# Patient Record
Sex: Male | Born: 1940
Health system: Southern US, Community
[De-identification: ages and names within clinical notes are randomized; demographics above are authoritative.]

## PROBLEM LIST (undated history)

## (undated) DIAGNOSIS — F419 Anxiety disorder, unspecified: Secondary | ICD-10-CM

## (undated) DIAGNOSIS — I700 Atherosclerosis of aorta: Secondary | ICD-10-CM

## (undated) DIAGNOSIS — I6523 Occlusion and stenosis of bilateral carotid arteries: Secondary | ICD-10-CM

## (undated) DIAGNOSIS — R7989 Other specified abnormal findings of blood chemistry: Secondary | ICD-10-CM

## (undated) DIAGNOSIS — I10 Essential (primary) hypertension: Secondary | ICD-10-CM

## (undated) DIAGNOSIS — I639 Cerebral infarction, unspecified: Secondary | ICD-10-CM

## (undated) DIAGNOSIS — N183 Chronic kidney disease, stage 3 unspecified: Secondary | ICD-10-CM

## (undated) DIAGNOSIS — C801 Malignant (primary) neoplasm, unspecified: Secondary | ICD-10-CM

## (undated) DIAGNOSIS — M1990 Unspecified osteoarthritis, unspecified site: Secondary | ICD-10-CM

## (undated) DIAGNOSIS — R945 Abnormal results of liver function studies: Secondary | ICD-10-CM

## (undated) DIAGNOSIS — E785 Hyperlipidemia, unspecified: Secondary | ICD-10-CM

## (undated) DIAGNOSIS — Z87442 Personal history of urinary calculi: Secondary | ICD-10-CM

## (undated) DIAGNOSIS — R001 Bradycardia, unspecified: Secondary | ICD-10-CM

## (undated) DIAGNOSIS — C4491 Basal cell carcinoma of skin, unspecified: Secondary | ICD-10-CM

## (undated) DIAGNOSIS — Z7902 Long term (current) use of antithrombotics/antiplatelets: Secondary | ICD-10-CM

## (undated) HISTORY — DX: Hyperlipidemia, unspecified: E78.5

## (undated) HISTORY — DX: Bradycardia, unspecified: R00.1

## (undated) HISTORY — DX: Cerebral infarction, unspecified: I63.9

## (undated) HISTORY — DX: Basal cell carcinoma of skin, unspecified: C44.91

## (undated) HISTORY — DX: Abnormal results of liver function studies: R94.5

## (undated) HISTORY — PX: CATARACT EXTRACTION: SUR2

## (undated) HISTORY — DX: Other specified abnormal findings of blood chemistry: R79.89

## (undated) HISTORY — DX: Anxiety disorder, unspecified: F41.9

## (undated) HISTORY — PX: COLONOSCOPY: SHX174

## (undated) HISTORY — DX: Malignant (primary) neoplasm, unspecified: C80.1

---

## 2008-03-16 DIAGNOSIS — C61 Malignant neoplasm of prostate: Secondary | ICD-10-CM

## 2008-03-16 DIAGNOSIS — C801 Malignant (primary) neoplasm, unspecified: Secondary | ICD-10-CM

## 2008-03-16 HISTORY — DX: Malignant (primary) neoplasm, unspecified: C80.1

## 2008-03-16 HISTORY — DX: Malignant neoplasm of prostate: C61

## 2008-05-18 ENCOUNTER — Emergency Department: Payer: Self-pay | Admitting: Emergency Medicine

## 2008-11-14 HISTORY — PX: PROSTATECTOMY: SHX69

## 2009-08-07 ENCOUNTER — Ambulatory Visit: Payer: Self-pay | Admitting: Gastroenterology

## 2010-09-14 DIAGNOSIS — C4491 Basal cell carcinoma of skin, unspecified: Secondary | ICD-10-CM

## 2010-09-14 HISTORY — DX: Basal cell carcinoma of skin, unspecified: C44.91

## 2010-10-28 LAB — HM COLONOSCOPY: HM Colonoscopy: NORMAL

## 2010-12-20 ENCOUNTER — Other Ambulatory Visit: Payer: Self-pay | Admitting: Internal Medicine

## 2011-01-08 ENCOUNTER — Other Ambulatory Visit: Payer: Self-pay | Admitting: Internal Medicine

## 2011-01-09 ENCOUNTER — Other Ambulatory Visit: Payer: Self-pay | Admitting: Internal Medicine

## 2011-01-09 NOTE — Telephone Encounter (Signed)
Ok to phone in.

## 2011-01-13 ENCOUNTER — Other Ambulatory Visit: Payer: Self-pay | Admitting: Internal Medicine

## 2011-02-13 ENCOUNTER — Encounter: Payer: Self-pay | Admitting: Internal Medicine

## 2011-02-13 ENCOUNTER — Ambulatory Visit (INDEPENDENT_AMBULATORY_CARE_PROVIDER_SITE_OTHER): Payer: Medicare Other | Admitting: Internal Medicine

## 2011-02-13 ENCOUNTER — Ambulatory Visit (INDEPENDENT_AMBULATORY_CARE_PROVIDER_SITE_OTHER)
Admission: RE | Admit: 2011-02-13 | Discharge: 2011-02-13 | Disposition: A | Discharge status: Home / Self Care | Payer: Medicare Other | Source: Ambulatory Visit | Attending: Internal Medicine | Admitting: Internal Medicine

## 2011-02-13 DIAGNOSIS — M1710 Unilateral primary osteoarthritis, unspecified knee: Secondary | ICD-10-CM

## 2011-02-13 DIAGNOSIS — M899 Disorder of bone, unspecified: Secondary | ICD-10-CM

## 2011-02-13 DIAGNOSIS — G893 Neoplasm related pain (acute) (chronic): Secondary | ICD-10-CM

## 2011-02-13 DIAGNOSIS — M79609 Pain in unspecified limb: Secondary | ICD-10-CM

## 2011-02-13 DIAGNOSIS — Z8546 Personal history of malignant neoplasm of prostate: Secondary | ICD-10-CM | POA: Insufficient documentation

## 2011-02-13 DIAGNOSIS — R5383 Other fatigue: Secondary | ICD-10-CM

## 2011-02-13 DIAGNOSIS — E785 Hyperlipidemia, unspecified: Secondary | ICD-10-CM

## 2011-02-13 DIAGNOSIS — Z79899 Other long term (current) drug therapy: Secondary | ICD-10-CM

## 2011-02-13 DIAGNOSIS — C4491 Basal cell carcinoma of skin, unspecified: Secondary | ICD-10-CM | POA: Insufficient documentation

## 2011-02-13 DIAGNOSIS — M949 Disorder of cartilage, unspecified: Secondary | ICD-10-CM

## 2011-02-13 DIAGNOSIS — M79606 Pain in leg, unspecified: Secondary | ICD-10-CM

## 2011-02-13 DIAGNOSIS — Z23 Encounter for immunization: Secondary | ICD-10-CM

## 2011-02-13 DIAGNOSIS — Z13820 Encounter for screening for osteoporosis: Secondary | ICD-10-CM

## 2011-02-13 DIAGNOSIS — M898X9 Other specified disorders of bone, unspecified site: Secondary | ICD-10-CM

## 2011-02-13 DIAGNOSIS — C801 Malignant (primary) neoplasm, unspecified: Secondary | ICD-10-CM

## 2011-02-13 LAB — CBC WITH DIFFERENTIAL/PLATELET
Basophils Relative: 0.4 % (ref 0.0–3.0)
Eosinophils Absolute: 0.1 10*3/uL (ref 0.0–0.7)
Hemoglobin: 15.8 g/dL (ref 13.0–17.0)
Lymphocytes Relative: 9.3 % — ABNORMAL LOW (ref 12.0–46.0)
MCHC: 34.3 g/dL (ref 30.0–36.0)
Monocytes Relative: 12.8 % — ABNORMAL HIGH (ref 3.0–12.0)
Neutro Abs: 4.1 10*3/uL (ref 1.4–7.7)
Neutrophils Relative %: 75.1 % (ref 43.0–77.0)
RBC: 4.96 Mil/uL (ref 4.22–5.81)
WBC: 5.4 10*3/uL (ref 4.5–10.5)

## 2011-02-13 LAB — COMPREHENSIVE METABOLIC PANEL
BUN: 24 mg/dL — ABNORMAL HIGH (ref 6–23)
CO2: 28 mEq/L (ref 19–32)
Calcium: 9.4 mg/dL (ref 8.4–10.5)
Chloride: 105 mEq/L (ref 96–112)
Creatinine, Ser: 1.4 mg/dL (ref 0.4–1.5)
GFR: 54.35 mL/min — ABNORMAL LOW (ref >60.00)
Glucose, Bld: 78 mg/dL (ref 70–99)
Total Bilirubin: 1.4 mg/dL — ABNORMAL HIGH (ref 0.3–1.2)

## 2011-02-13 LAB — TSH: TSH: 1.56 u[IU]/mL (ref 0.35–5.50)

## 2011-02-13 NOTE — Progress Notes (Signed)
Subjective:    Patient ID: Bobby Murillo, male    DOB: February 01, 1941, 70 y.o.   [name]: [id number]  HPI  Mr. Dorris is a 70 yr old white male with a history of hyperlipidemia, hypertension, OA and prostate cancer diagnosied in 2010, s/p radical prostatectomy who returns for followup on chronic medical issues.  His  PSA has been trending upward and when it reached 0.6 he was referred by Dr. Baxter Flattery for XRT and is now seeing Dr. Rodena Medin .  He has had 5 of 6 XRT weekly sessions with only fatigue to report,  No proctitis.  His CC is recurrent nocturnal left leg pain , mostly distal but occasionally the entire leg.  Does not occur with activity, specifically not  when he plays tennis.  No history of DM or tobacco abuse. No back pain. He stopped his pravastatin for a month but the pain did not change.   His prior DHD pain complaints have improved after receiving steroid injections ot both knees several months ago.    Review of Systems  Constitutional: Positive for fatigue. Negative for fever, chills, diaphoresis, activity change, appetite change and unexpected weight change.  HENT: Negative for hearing loss, ear pain, nosebleeds, congestion, sore throat, facial swelling, rhinorrhea, sneezing, drooling, mouth sores, trouble swallowing, neck pain, neck stiffness, dental problem, voice change, postnasal drip, sinus pressure, tinnitus and ear discharge.   Eyes: Negative for photophobia, pain, discharge, redness, itching and visual disturbance.  Respiratory: Negative for apnea, cough, choking, chest tightness, shortness of breath, wheezing and stridor.   Cardiovascular: Negative for chest pain, palpitations and leg swelling.  Gastrointestinal: Negative for nausea, vomiting, abdominal pain, diarrhea, constipation, blood in stool, abdominal distention, anal bleeding and rectal pain.  Genitourinary: Negative for dysuria, urgency, frequency, hematuria, flank pain, decreased urine volume, scrotal swelling, difficulty urinating  and testicular pain.  Musculoskeletal: Positive for arthralgias. Negative for myalgias, back pain, joint swelling and gait problem.  Skin: Negative for color change, rash and wound.  Neurological: Negative for dizziness, tremors, seizures, syncope, speech difficulty, weakness, light-headedness, numbness and headaches.  Psychiatric/Behavioral: Negative for suicidal ideas, hallucinations, behavioral problems, confusion, sleep disturbance, dysphoric mood, decreased concentration and agitation. The patient is not nervous/anxious.        Objective:   Physical Exam  Constitutional: He is oriented to person, place, and time.  HENT:  Head: Normocephalic and atraumatic.  Mouth/Throat: Oropharynx is clear and moist.  Eyes: Conjunctivae and EOM are normal.  Neck: Normal range of motion. Neck supple. No JVD present. No thyromegaly present.  Cardiovascular: Normal rate, regular rhythm, normal heart sounds and intact distal pulses.   Pulmonary/Chest: Effort normal and breath sounds normal. He has no wheezes. He has no rales.  Abdominal: Soft. Bowel sounds are normal. He exhibits no mass. There is no tenderness. There is no rebound.  Musculoskeletal: Normal range of motion. He exhibits no edema.  Neurological: He is alert and oriented to person, place, and time.  Skin: Skin is warm and dry.       excessively dry, normal hair disrtibution on leg  Psychiatric: He has a normal mood and affect.       Assessment & Plan:   prostate cancer Managed by Dr. Baxter Flattery with radical prostatectomy in Sept 2010, Gleaeon score 4 + 3 = 7 , positive margins.  Recently started XRT for rise in PSA from undetectible to detectible levels, now 0.6   Leg pain, diffuse Left leg, for the last several months, with no joint  involvement.  Etiology unclear,  But given his history of prostate Cancer will obtain plain films to rule out sclerotic bone lesions /metastasis   Degenerative joint disease of knee improved with bilateral  steroid injections done by me last July, left knee was done in April 2012 by Dr. Katrinka Blazing.  Continue prn tramadol and  Ibuprofen. Prior daily use of meloxicam raised his Cr.  Repeat labs show a slight rise in Cr from 1.23 in April to 1.4 currently.  Will repeat in 6 months   Hyperlipidemia LDL goal < 100 Repeat LDL is 122 on currewnt statin dose,  Will increase statin with next refill given CRFS of hypertension.     Updated Medication List Outpatient Encounter Prescriptions as of 02/13/2011  Medication Sig Dispense Refill  . acetaminophen (TYLENOL) 325 MG tablet Take 650 mg by mouth every 6 (six) hours as needed.        . ALPRAZolam (XANAX) 0.5 MG tablet TAKE ONE TABLET BY MOUTH AT BEDTIME AS NEEDED FOR SLEEP  90 tablet  2  . cholecalciferol (VITAMIN D) 1000 UNITS tablet Take 1,000 Units by mouth daily.        Marland Kitchen ibuprofen (ADVIL,MOTRIN) 200 MG tablet Take 200 mg by mouth every 6 (six) hours as needed.        Marland Kitchen lisinopril (PRINIVIL,ZESTRIL) 10 MG tablet Take 10 mg by mouth daily.        . Multiple Vitamin (MULTIVITAMIN) tablet Take 1 tablet by mouth daily.        . naproxen (NAPROSYN) 250 MG tablet Take 250 mg by mouth as needed.        . polycarbophil (FIBERCON) 625 MG tablet Take 625 mg by mouth daily.        . pravastatin (PRAVACHOL) 40 MG tablet Take 40 mg by mouth daily.        Marland Kitchen DISCONTD: traMADol (ULTRAM) 50 MG tablet Take 50 mg by mouth every 6 (six) hours as needed. Maximum dose= 8 tablets per day

## 2011-02-13 NOTE — Patient Instructions (Addendum)
We are gong to investigate your leg pain with plain films of the femur and tib/fib at [facility] today.  Your pulses are excellent so I think that a circulation problem is highly unlikely  I am ruling out underactive thyroid and anemia as contributors to your fatigue.   I am going to schedule your for a bone density test to make sure you do not have osteoprosis

## 2011-02-15 ENCOUNTER — Encounter: Payer: Self-pay | Admitting: Internal Medicine

## 2011-02-15 DIAGNOSIS — M79606 Pain in leg, unspecified: Secondary | ICD-10-CM | POA: Insufficient documentation

## 2011-02-15 DIAGNOSIS — M1710 Unilateral primary osteoarthritis, unspecified knee: Secondary | ICD-10-CM | POA: Insufficient documentation

## 2011-02-15 DIAGNOSIS — E785 Hyperlipidemia, unspecified: Secondary | ICD-10-CM | POA: Insufficient documentation

## 2011-02-15 NOTE — Assessment & Plan Note (Signed)
improved with bilateral steroid injections done by me last July, left knee was done in April 2012 by Dr. Katrinka Blazing.  Continue prn tramadol and  Ibuprofen. Prior daily use of meloxicam raised his Cr.  Repeat labs show a slight rise in Cr from 1.23 in April to 1.4 currently.  Will repeat in 6 months

## 2011-02-15 NOTE — Assessment & Plan Note (Signed)
Managed by Dr. Baxter Flattery with radical prostatectomy in Sept 2010, Gleaeon score 4 + 3 = 7 , positive margins.  Recently started XRT for rise in PSA from undetectible to detectible levels, now 0.6

## 2011-02-15 NOTE — Assessment & Plan Note (Addendum)
Left leg, for the last several months, with no joint involvement.  Etiology unclear,  But given his history of prostate Cancer I ordered plain films to rule out sclerotic bone lesions /metastasis .  There were no sclerotic or lytic lesions any bones, and DJD was noted.   The fibular cortex was irregular, which could indicate bony turnover and a nuclear bone scan was offered .  Will refer to [facility] for this test.

## 2011-02-15 NOTE — Assessment & Plan Note (Signed)
Repeat LDL is 122 on currewnt statin dose,  Will increase statin with next refill given CRFS of hypertension.

## 2011-02-18 NOTE — Progress Notes (Signed)
I don't see that physician in the [facility] , I did find a Dr. Carin Primrose [phone].

## 2011-02-25 ENCOUNTER — Ambulatory Visit: Payer: Self-pay | Admitting: Internal Medicine

## 2011-02-27 ENCOUNTER — Telehealth: Payer: Self-pay | Admitting: *Deleted

## 2011-02-27 NOTE — Telephone Encounter (Signed)
This is your pt

## 2011-02-27 NOTE — Telephone Encounter (Signed)
Pt calling in regards to Bone Density scan being set up. Please Advise

## 2011-02-27 NOTE — Telephone Encounter (Signed)
Carollee Herter , this patient i inquiring about the bone density test I ordered ovwer a week ago,  Have you  scheduled it yet?  Can you call him back?

## 2011-03-02 NOTE — Telephone Encounter (Signed)
Patient has a bone density test scheduled for Wednesday 03/04/11 at 10:30 he is not to take any calcium supplements 24 hours prior to test.  He is aware of this appointment.

## 2011-03-02 NOTE — Telephone Encounter (Signed)
Please patient know that I spoke with Dr Nedra Hai about his bone pain and sent him a copy of the  X ray reports that he had done . He  is seeing Dr Nedra Hai tomorrow and Dr Nedra Hai will order a Bone Scan (different than a DEXA scan, which is the bone density test that looks for osteoporosis) if he thinks it is necessary

## 2011-03-02 NOTE — Telephone Encounter (Signed)
Patient notified

## 2011-03-03 ENCOUNTER — Encounter: Payer: Self-pay | Admitting: Internal Medicine

## 2011-03-04 ENCOUNTER — Ambulatory Visit: Payer: Self-pay | Admitting: Internal Medicine

## 2011-03-05 ENCOUNTER — Telehealth: Payer: Self-pay | Admitting: Internal Medicine

## 2011-03-05 NOTE — Telephone Encounter (Signed)
Pt aware of this. 

## 2011-03-05 NOTE — Telephone Encounter (Signed)
Bobby Murillo has no signs of osteoporosis by his recent Bone Density Study ( FYI THIS IS NOT the same study as a Bone scan; it does not identify cancer,  just thin bones at [facility] for fractures )

## 2011-04-01 ENCOUNTER — Encounter: Payer: Self-pay | Admitting: Internal Medicine

## 2011-05-20 ENCOUNTER — Encounter: Payer: Self-pay | Admitting: Internal Medicine

## 2011-10-20 ENCOUNTER — Telehealth: Payer: Self-pay | Admitting: Internal Medicine

## 2011-10-20 DIAGNOSIS — R5383 Other fatigue: Secondary | ICD-10-CM

## 2011-10-20 DIAGNOSIS — Z8546 Personal history of malignant neoplasm of prostate: Secondary | ICD-10-CM

## 2011-10-20 DIAGNOSIS — E785 Hyperlipidemia, unspecified: Secondary | ICD-10-CM

## 2011-10-20 NOTE — Telephone Encounter (Signed)
Fasting labs ordered.  please confirm that he wants me check a PSA

## 2011-10-20 NOTE — Telephone Encounter (Signed)
Pt called to see if you wanted lab work prior to his appointment  On 8/14 for medicare wellness [facility] Please advise pt

## 2011-10-21 NOTE — Telephone Encounter (Signed)
Order faxed to [facility].  Patient is okay with having the psa

## 2011-10-22 ENCOUNTER — Telehealth: Payer: Self-pay | Admitting: Internal Medicine

## 2011-10-22 NOTE — Telephone Encounter (Signed)
Patient is needing CPX labs put in the system for 8.9.13.

## 2011-10-23 ENCOUNTER — Other Ambulatory Visit (INDEPENDENT_AMBULATORY_CARE_PROVIDER_SITE_OTHER): Payer: Medicare Other | Admitting: *Deleted

## 2011-10-23 DIAGNOSIS — Z8546 Personal history of malignant neoplasm of prostate: Secondary | ICD-10-CM

## 2011-10-23 DIAGNOSIS — E785 Hyperlipidemia, unspecified: Secondary | ICD-10-CM

## 2011-10-23 DIAGNOSIS — R5383 Other fatigue: Secondary | ICD-10-CM

## 2011-10-23 NOTE — Telephone Encounter (Signed)
They were put in several days ago.

## 2011-10-23 NOTE — Addendum Note (Signed)
Addended by: Melody Comas L on: 10/23/2011 11:42 AM   Modules accepted: Orders

## 2011-10-24 LAB — COMPREHENSIVE METABOLIC PANEL
ALT: 23 IU/L (ref 0–44)
Albumin: 4.3 g/dL (ref 3.6–4.8)
BUN: 20 mg/dL (ref 8–27)
CO2: 22 mmol/L (ref 19–28)
Calcium: 9.5 mg/dL (ref 8.6–10.2)
Chloride: 105 mmol/L (ref 97–108)
GFR calc Af Amer: 69 mL/min/{1.73_m2} (ref >59)
Glucose: 91 mg/dL (ref 65–99)
Potassium: 4.6 mmol/L (ref 3.5–5.2)
Total Bilirubin: 1.1 mg/dL (ref 0.0–1.2)
Total Protein: 6.6 g/dL (ref 6.0–8.5)

## 2011-10-24 LAB — LIPID PANEL
Cholesterol, Total: 280 mg/dL — ABNORMAL HIGH (ref 100–199)
HDL: 63 mg/dL (ref >39)
LDL Calculated: 192 mg/dL — ABNORMAL HIGH (ref 0–99)
Triglycerides: 126 mg/dL (ref 0–149)
VLDL Cholesterol Cal: 25 mg/dL (ref 5–40)

## 2011-10-24 LAB — CBC WITH DIFFERENTIAL/PLATELET
Basophils Absolute: 0 10*3/uL (ref 0.0–0.2)
Basos: 1 % (ref 0–3)
Hemoglobin: 16.3 g/dL (ref 12.6–17.7)
Immature Grans (Abs): 0 10*3/uL (ref 0.0–0.1)
Lymphs: 15 % (ref 14–46)
MCHC: 35.6 g/dL (ref 31.5–35.7)
Monocytes: 14 % — ABNORMAL HIGH (ref 4–13)
Neutrophils Absolute: 3.3 10*3/uL (ref 1.8–7.8)
Neutrophils Relative %: 66 % (ref 40–74)
RBC: 5.21 x10E6/uL (ref 4.14–5.80)

## 2011-10-28 ENCOUNTER — Ambulatory Visit (INDEPENDENT_AMBULATORY_CARE_PROVIDER_SITE_OTHER): Payer: Medicare Other | Admitting: Internal Medicine

## 2011-10-28 ENCOUNTER — Encounter: Payer: Self-pay | Admitting: Internal Medicine

## 2011-10-28 VITALS — BP 120/86 | HR 58 | Temp 97.6°F | Ht 73.0 in | Wt 198.5 lb

## 2011-10-28 DIAGNOSIS — C801 Malignant (primary) neoplasm, unspecified: Secondary | ICD-10-CM

## 2011-10-28 DIAGNOSIS — M79609 Pain in unspecified limb: Secondary | ICD-10-CM

## 2011-10-28 DIAGNOSIS — E785 Hyperlipidemia, unspecified: Secondary | ICD-10-CM

## 2011-10-28 DIAGNOSIS — M79606 Pain in leg, unspecified: Secondary | ICD-10-CM

## 2011-10-28 NOTE — Patient Instructions (Addendum)
Stay off of pravastatin  if your muscles are feeling  better  We will send the lab results to [facility]

## 2011-10-28 NOTE — Progress Notes (Signed)
Patient ID: Bobby Murillo, male   DOB: July 03, 1940, 71 y.o.   [name]: [id number]  The patient is here for annual Medicare wellness examination and management of other chronic and acute problems.   The risk factors are reflected in the social history.  The roster of all physicians providing medical care to patient - is listed in the Snapshot section of the chart.  Activities of daily living:  The patient is 100% independent in all ADLs: dressing, toileting, feeding as well as independent mobility  Home safety : The patient has smoke detectors in the home. They wear seatbelts.  There are no firearms at home. There is no violence in the home.   There is no risks for hepatitis, STDs or HIV. There is no   history of blood transfusion. They have no travel history to infectious disease endemic areas of the world.  The patient has seen their dentist in the last six month. They have seen their eye doctor in the last year. They admit to slight hearing difficulty with regard to whispered voices and some television programs.  They have deferred audiologic testing in the last year.  They do not  have excessive sun exposure. Discussed the need for sun protection: hats, long sleeves and use of sunscreen if there is significant sun exposure.   Diet: the importance of a healthy diet is discussed. They do have a healthy diet.  The benefits of regular aerobic exercise were discussed. She walks 4 times per week ,  20 minutes.   Depression screen: there are no signs or vegative symptoms of depression- irritability, change in appetite, anhedonia, sadness/tearfullness.  Cognitive assessment: the patient manages all their financial and personal affairs and is actively engaged. They could relate day,date,year and events; recalled 2/3 objects at 3 minutes; performed clock-face test normally.  The following portions of the patient's history were reviewed and updated as appropriate: allergies, current medications, past family  history, past medical history,  past surgical history, past social history  and problem list.  Visual acuity was not assessed per patient preference since she has regular follow up with her ophthalmologist. Hearing and body mass index were assessed and reviewed.   During the course of the visit the patient was educated and counseled about appropriate screening and preventive services including : fall prevention , diabetes screening, nutrition counseling, colorectal cancer screening, and recommended immunizations.    Objective:  BP 120/86  Pulse 58  Temp 97.6 F (36.4 C) (Oral)  Ht 6\' 1"  (1.854 m)  Wt 198 lb 8 oz (90.039 kg)  BMI 26.19 kg/m2  SpO2 99% General appearance: alert, cooperative and appears stated age Ears: normal TM's and external ear canals both ears Throat: lips, mucosa, and tongue normal; teeth and gums normal Neck: no adenopathy, no carotid bruit, no JVD, supple, symmetrical, trachea midline and thyroid not enlarged, symmetric, no tenderness/mass/nodules Back: symmetric, no curvature. ROM normal. No CVA tenderness. Lungs: clear to auscultation bilaterally Chest wall: no tenderness Heart: regular rate and rhythm, S1, S2 normal, no murmur, click, rub or gallop Abdomen: soft, non-tender; bowel sounds normal; no masses,  no organomegaly Extremities: extremities normal, atraumatic, no cyanosis or edema Pulses: 2+ and symmetric Skin: Skin color, texture, turgor normal. No rashes or lesions  Assessment and Plan Hyperlipidemia LDL goal < 100 His leg pain has improved since he stopped his statin. He has no history of coronary artery disease or stroke. He is physically active and maintains a good diet. We discussed the risks  and benefits of resuming statin therapy and is essentially suspend indefinitely.  Leg pain, diffuse Ideology appears to have been statin use. X-rays were normal , scans were normal.  Pain is improved since stopping his statin.  prostate cancer PSA is now  undetectable again since undergoing repeat therapy by Dr. Baxter Flattery.    Updated Medication List Outpatient Encounter Prescriptions as of 10/28/2011  Medication Sig Dispense Refill  . acetaminophen (TYLENOL) 325 MG tablet Take 650 mg by mouth every 6 (six) hours as needed.        . ALPRAZolam (XANAX) 0.5 MG tablet TAKE ONE TABLET BY MOUTH AT BEDTIME AS NEEDED FOR SLEEP  90 tablet  2  . cholecalciferol (VITAMIN D) 1000 UNITS tablet Take 1,000 Units by mouth daily.        Marland Kitchen ibuprofen (ADVIL,MOTRIN) 200 MG tablet Take 200 mg by mouth every 6 (six) hours as needed.        Marland Kitchen lisinopril (PRINIVIL,ZESTRIL) 10 MG tablet Take 10 mg by mouth daily.        . Multiple Vitamin (MULTIVITAMIN) tablet Take 1 tablet by mouth daily.        . naproxen (NAPROSYN) 250 MG tablet Take 250 mg by mouth as needed.        . polycarbophil (FIBERCON) 625 MG tablet Take 625 mg by mouth daily.        Marland Kitchen DISCONTD: pravastatin (PRAVACHOL) 40 MG tablet Take 40 mg by mouth daily.

## 2011-10-29 NOTE — Assessment & Plan Note (Signed)
Ideology appears to have been statin use. X-rays were normal , scans were normal.  Pain is improved since stopping his statin.

## 2011-10-29 NOTE — Assessment & Plan Note (Signed)
His leg pain has improved since he stopped his statin. He has no history of coronary artery disease or stroke. He is physically active and maintains a good diet. We discussed the risks and benefits of resuming statin therapy and is essentially suspend indefinitely.

## 2011-10-29 NOTE — Assessment & Plan Note (Signed)
PSA is now undetectable again since undergoing repeat therapy by Dr. Baxter Flattery.

## 2011-11-19 ENCOUNTER — Other Ambulatory Visit: Payer: Self-pay | Admitting: Internal Medicine

## 2011-11-20 NOTE — Telephone Encounter (Signed)
Rx called to [facility]

## 2011-12-11 ENCOUNTER — Other Ambulatory Visit: Payer: Self-pay | Admitting: Internal Medicine

## 2012-01-12 DIAGNOSIS — C61 Malignant neoplasm of prostate: Secondary | ICD-10-CM | POA: Insufficient documentation

## 2012-04-22 ENCOUNTER — Telehealth: Payer: Self-pay | Admitting: *Deleted

## 2012-04-22 DIAGNOSIS — C61 Malignant neoplasm of prostate: Secondary | ICD-10-CM

## 2012-04-22 DIAGNOSIS — C801 Malignant (primary) neoplasm, unspecified: Secondary | ICD-10-CM

## 2012-04-22 DIAGNOSIS — Z79899 Other long term (current) drug therapy: Secondary | ICD-10-CM

## 2012-04-22 DIAGNOSIS — R5383 Other fatigue: Secondary | ICD-10-CM

## 2012-04-22 DIAGNOSIS — E785 Hyperlipidemia, unspecified: Secondary | ICD-10-CM

## 2012-04-22 NOTE — Telephone Encounter (Signed)
Patient called wanting lab order for [facility] before his 05/04/12 appointment with Dr. Darrick Huntsman. He uses [facility] on [address]. Patient would like a call back when order is ready. Cell [phone]

## 2012-04-22 NOTE — Telephone Encounter (Signed)
Order can be picked up on Monday and will be in your red folder

## 2012-04-25 NOTE — Telephone Encounter (Signed)
Pt notified and orders placed up front for pick up.

## 2012-05-04 ENCOUNTER — Ambulatory Visit (INDEPENDENT_AMBULATORY_CARE_PROVIDER_SITE_OTHER): Payer: Medicare Other | Admitting: Internal Medicine

## 2012-05-04 ENCOUNTER — Telehealth: Payer: Self-pay | Admitting: Internal Medicine

## 2012-05-04 ENCOUNTER — Encounter: Payer: Self-pay | Admitting: Internal Medicine

## 2012-05-04 VITALS — BP 114/64 | HR 56 | Temp 98.2°F | Resp 16 | Ht 72.75 in | Wt 204.5 lb

## 2012-05-04 DIAGNOSIS — M1710 Unilateral primary osteoarthritis, unspecified knee: Secondary | ICD-10-CM

## 2012-05-04 DIAGNOSIS — C801 Malignant (primary) neoplasm, unspecified: Secondary | ICD-10-CM

## 2012-05-04 DIAGNOSIS — IMO0002 Reserved for concepts with insufficient information to code with codable children: Secondary | ICD-10-CM

## 2012-05-04 DIAGNOSIS — M79606 Pain in leg, unspecified: Secondary | ICD-10-CM

## 2012-05-04 DIAGNOSIS — M79609 Pain in unspecified limb: Secondary | ICD-10-CM

## 2012-05-04 DIAGNOSIS — E785 Hyperlipidemia, unspecified: Secondary | ICD-10-CM

## 2012-05-04 MED ORDER — OMEPRAZOLE 20 MG PO CPDR: 20.0000 mg | DELAYED_RELEASE_CAPSULE | Freq: Every day | ORAL | Status: DC

## 2012-05-04 NOTE — Progress Notes (Signed)
Patient ID: Bobby Murillo, male   DOB: [birth year], 72 y.o.   [name]: [id number]  Patient Active Problem List  Diagnosis  . prostate cancer  . Basal cell carcinoma  . Leg pain, diffuse  . Degenerative joint disease of knee  . Other and unspecified hyperlipidemia    Subjective:  CC:   Chief Complaint  Patient presents with  . Follow-up    6 month    HPI:   Bobby Murillo a 72 y.o. male who presents for 6 month follow up on chronic medical conditions including hyperlipidemia, intolerant of statins due to diffuse leg pain, history of prostate CA,  and OA .  He feels generally well and remains physically very active in retirement but continues to have chronic bilateral knee pain which limits his flexion and is therefore affecting his running speed.  He has been seen by orthopedist Reita Chard and there are no plans for surgery, but intraarticular steroid injections have been offered.  Dr Stoney Bang has released him but has advised semi annual PSAs for surveillance. He has no issues with bladder dysfunction or impotence.    Past Medical History  Diagnosis Date  . Anxiety   . Hyperlipidemia   . Abnormal LFTs   . prostate cancer 2010    Dr. Baxter Flattery  . Basal cell carcinoma July 2012    left calf     Past Surgical History  Procedure Laterality Date  . Prostatectomy  11/2008    transurethral, radical, Dr. Carin Primrose       The following portions of the patient's history were reviewed and updated as appropriate: Allergies, current medications, and problem list.    Review of Systems:   Patient denies headache, fevers, malaise, unintentional weight loss, skin rash, eye pain, sinus congestion and sinus pain, sore throat, dysphagia,  hemoptysis , cough, dyspnea, wheezing, chest pain, palpitations, orthopnea, edema, abdominal pain, nausea, melena, diarrhea, constipation, flank pain, dysuria, hematuria, urinary  Frequency, nocturia, numbness, tingling, seizures,  Focal weakness, Loss of consciousness,   Tremor, insomnia, depression, anxiety, and suicidal ideation.       History   Social History  . Marital Status: Married    Spouse Name: N/A    Number of Children: N/A  . Years of Education: N/A   Occupational History  . Not on file.   Social History Main Topics  . Smoking status: Never Smoker   . Smokeless tobacco: Never Used  . Alcohol Use: Yes  . Drug Use: No  . Sexually Active: Not on file   Other Topics Concern  . Not on file   Social History Narrative  . No narrative on file    Objective:  BP 114/64  Pulse 56  Temp(Src) 98.2 F (36.8 C) (Oral)  Resp 16  Ht 6' 0.75" (1.848 m)  Wt 204 lb 8 oz (92.761 kg)  BMI 27.16 kg/m2  SpO2 97%  General appearance: alert, cooperative and appears stated age Neck: no adenopathy, no carotid bruit, supple, symmetrical, trachea midline and thyroid not enlarged, symmetric, no tenderness/mass/nodules Back: symmetric, no curvature. ROM normal. No CVA tenderness. Lungs: clear to auscultation bilaterally Heart: regular rate and rhythm, S1, S2 normal, no murmur, click, rub or gallop. No LE edema Abdomen: soft, non-tender; bowel sounds normal; no masses,  no organomegaly Pulses: 2+ and symmetric Skin: Skin color, texture, turgor normal. No rashes or lesions Lymph nodes: Cervical, supraclavicular, and axillary nodes normal. MSK: mildy restricted ROM of knees with regard to flexion , otherwise normal.  Assessment and Plan:  Degenerative joint disease of knee Stable currently.  No plans for surgery at this time.   Leg pain, diffuse Secondary to statin intolerance,  Resolved with statin discontinuation  prostate cancer PSA remains undetectable.   Other and unspecified hyperlipidemia Untreated secondary to statin intolerance.  He has no other CRFs other than age and remains physically quite active with no signs or symptoms concerning for CAD or PAD.    Updated Medication List Outpatient Encounter Prescriptions as of  05/04/2012  Medication Sig Dispense Refill  . acetaminophen (TYLENOL) 325 MG tablet Take 650 mg by mouth every 6 (six) hours as needed.        . ALPRAZolam (XANAX) 0.5 MG tablet TAKE ONE TABLET BY MOUTH AT BEDTIME AS NEEDED FOR SLEEP  90 tablet  1  . cholecalciferol (VITAMIN D) 1000 UNITS tablet Take 1,000 Units by mouth daily.        . Glucosamine-Chondroitin (GLUCOSAMINE CHONDR COMPLEX PO) Take 2 tablets by mouth daily. tab by mouth daily      . ibuprofen (ADVIL,MOTRIN) 200 MG tablet Take 200 mg by mouth every 6 (six) hours as needed.        Marland Kitchen lisinopril (PRINIVIL,ZESTRIL) 10 MG tablet TAKE ONE TABLET BY MOUTH EVERY DAY  90 tablet  3  . Multiple Vitamin (MULTIVITAMIN) tablet Take 1 tablet by mouth daily.        . naproxen (NAPROSYN) 250 MG tablet Take 250 mg by mouth as needed.        . polycarbophil (FIBERCON) 625 MG tablet Take 625 mg by mouth daily.        Marland Kitchen omeprazole (PRILOSEC) 20 MG capsule Take 1 capsule (20 mg total) by mouth daily.  30 capsule  3   No facility-administered encounter medications on file as of 05/04/2012.     No orders of the defined types were placed in this encounter.    No Follow-up on file.

## 2012-05-05 ENCOUNTER — Encounter: Payer: Self-pay | Admitting: General Practice

## 2012-05-05 ENCOUNTER — Telehealth: Payer: Self-pay | Admitting: Internal Medicine

## 2012-05-05 DIAGNOSIS — N181 Chronic kidney disease, stage 1: Secondary | ICD-10-CM

## 2012-05-05 NOTE — Telephone Encounter (Signed)
Pt notified of results also wanted labs drawn at [facility]. Letter written and placed in folder up front for pick up. Pt notified.

## 2012-05-05 NOTE — Telephone Encounter (Signed)
PSA is still undetectable , and cholesterol is about the same without medication: LDL 190, trigs 165 HDL 59 total 282.  Kidney function was a little off, may be because he was fasting, but would like to repeat it in a few weeks nonfasting to be sure.

## 2012-05-07 NOTE — Assessment & Plan Note (Signed)
Untreated secondary to statin intolerance.  He has no other CRFs other than age and remains physically quite active with no signs or symptoms concerning for CAD or PAD.

## 2012-05-07 NOTE — Assessment & Plan Note (Signed)
Stable currently.  No plans for surgery at this time.

## 2012-05-07 NOTE — Assessment & Plan Note (Signed)
Secondary to statin intolerance,  Resolved with statin discontinuation

## 2012-05-07 NOTE — Assessment & Plan Note (Signed)
PSA remains undetectable.

## 2012-05-17 ENCOUNTER — Encounter: Payer: Self-pay | Admitting: Internal Medicine

## 2012-05-26 ENCOUNTER — Encounter: Payer: Self-pay | Admitting: Internal Medicine

## 2012-10-31 ENCOUNTER — Other Ambulatory Visit: Payer: Self-pay | Admitting: *Deleted

## 2012-10-31 MED ORDER — ALPRAZOLAM 0.5 MG PO TABS: ORAL_TABLET | ORAL | Status: DC

## 2012-11-01 ENCOUNTER — Encounter: Payer: Self-pay | Admitting: *Deleted

## 2012-11-02 ENCOUNTER — Ambulatory Visit (INDEPENDENT_AMBULATORY_CARE_PROVIDER_SITE_OTHER): Payer: Medicare Other | Admitting: Internal Medicine

## 2012-11-02 ENCOUNTER — Encounter: Payer: Self-pay | Admitting: Internal Medicine

## 2012-11-02 VITALS — BP 128/70 | HR 50 | Temp 97.6°F | Resp 12 | Wt 197.2 lb

## 2012-11-02 DIAGNOSIS — Z6825 Body mass index (BMI) 25.0-25.9, adult: Secondary | ICD-10-CM

## 2012-11-02 DIAGNOSIS — D649 Anemia, unspecified: Secondary | ICD-10-CM

## 2012-11-02 DIAGNOSIS — R5381 Other malaise: Secondary | ICD-10-CM

## 2012-11-02 DIAGNOSIS — Z888 Allergy status to other drugs, medicaments and biological substances status: Secondary | ICD-10-CM

## 2012-11-02 DIAGNOSIS — E785 Hyperlipidemia, unspecified: Secondary | ICD-10-CM

## 2012-11-02 DIAGNOSIS — M1710 Unilateral primary osteoarthritis, unspecified knee: Secondary | ICD-10-CM

## 2012-11-02 DIAGNOSIS — E663 Overweight: Secondary | ICD-10-CM

## 2012-11-02 DIAGNOSIS — Z789 Other specified health status: Secondary | ICD-10-CM

## 2012-11-02 LAB — CBC WITH DIFFERENTIAL/PLATELET
Basophils Absolute: 0 K/uL (ref 0.0–0.1)
Basophils Relative: 0.6 % (ref 0.0–3.0)
Eosinophils Absolute: 0.2 K/uL (ref 0.0–0.7)
Eosinophils Relative: 3.5 % (ref 0.0–5.0)
HCT: 46.2 % (ref 39.0–52.0)
Hemoglobin: 15.9 g/dL (ref 13.0–17.0)
Lymphocytes Relative: 15.3 % (ref 12.0–46.0)
Lymphs Abs: 0.7 K/uL (ref 0.7–4.0)
MCHC: 34.4 g/dL (ref 30.0–36.0)
MCV: 90.6 fl (ref 78.0–100.0)
Monocytes Absolute: 0.7 K/uL (ref 0.1–1.0)
Monocytes Relative: 13.7 % — ABNORMAL HIGH (ref 3.0–12.0)
Neutro Abs: 3.2 K/uL (ref 1.4–7.7)
Neutrophils Relative %: 66.9 % (ref 43.0–77.0)
Platelets: 229 K/uL (ref 150.0–400.0)
RBC: 5.1 Mil/uL (ref 4.22–5.81)
RDW: 13.6 % (ref 11.5–14.6)
WBC: 4.8 K/uL (ref 4.5–10.5)

## 2012-11-02 LAB — COMPREHENSIVE METABOLIC PANEL
ALT: 25 U/L (ref 0–53)
Albumin: 3.9 g/dL (ref 3.5–5.2)
Alkaline Phosphatase: 79 U/L (ref 39–117)
CO2: 28 mEq/L (ref 19–32)
GFR: 57.94 mL/min — ABNORMAL LOW (ref >60.00)
Glucose, Bld: 68 mg/dL — ABNORMAL LOW (ref 70–99)
Potassium: 4.5 mEq/L (ref 3.5–5.1)
Sodium: 136 mEq/L (ref 135–145)
Total Bilirubin: 1.5 mg/dL — ABNORMAL HIGH (ref 0.3–1.2)
Total Protein: 6.6 g/dL (ref 6.0–8.3)

## 2012-11-02 LAB — VITAMIN B12: Vitamin B-12: 452 pg/mL (ref 211–911)

## 2012-11-02 MED ORDER — TETANUS-DIPHTH-ACELL PERTUSSIS 5-2.5-18.5 LF-MCG/0.5 IM SUSP: 0.5000 mL | Freq: Once | INTRAMUSCULAR | Status: DC

## 2012-11-02 NOTE — Patient Instructions (Addendum)
We will screen you today for fatigue issues  Try taking 1 or 2 acetominophen at bedtime to see if the shoulder pain is affecting your sleep  You need a TDaP vaccine ( I have given you a prescription to get it done at a local pharmacy): do not get it done before a golf or tennis game. It will cause a sore deltoid

## 2012-11-02 NOTE — Progress Notes (Signed)
Patient ID: Bobby Murillo, male   DOB: 05-30-1940, 72 y.o.   [name]: [id number]   Patient Active Problem List   Diagnosis Date Noted  . Statin intolerance 11/03/2012  . Overweight (BMI 25.0-29.9) 11/03/2012  . Other malaise and fatigue 11/03/2012  . Degenerative joint disease of knee 02/15/2011  . Other and unspecified hyperlipidemia 02/15/2011  . prostate cancer   . Basal cell carcinoma     Subjective:  CC:   Chief Complaint  Patient presents with  . Follow-up    6 month follow up    HPI:   Bobby Murillo a 72 y.o. male who presents Six-month followup on chronic conditions. He has been feeling more tired than usual .  He has  lost 7 lbs bc his wife Bobby Murillo is on following the weight watchers diet,  and per patient he appears to be doing a better job than she is. The arthritis in his knees has improved with weight loss. He is still playing tennis and golf several times per week. He denies shortness of breath and chest pain.   Sleeps well most of the time . He has some bilateral joint pain in the shoulders ,  left more than right which is aggravated by sleeping on his side. However he is unable to sleep on his back for a period time.   Past Medical History  Diagnosis Date  . Anxiety   . Hyperlipidemia   . Abnormal LFTs   . prostate cancer 2010    Dr. Baxter Flattery  . Basal cell carcinoma July 2012    left calf     Past Surgical History  Procedure Laterality Date  . Prostatectomy  11/2008    transurethral, radical, Dr. Carin Primrose       The following portions of the patient's history were reviewed and updated as appropriate: Allergies, current medications, and problem list.    Review of Systems:   Patient denies headache, fevers, unintentional weight loss, skin rash, eye pain, sinus congestion and sinus pain, sore throat, dysphagia,  hemoptysis , cough, dyspnea, wheezing, chest pain, palpitations, orthopnea, edema, abdominal pain, nausea, melena, diarrhea, constipation, flank pain,  dysuria, hematuria, urinary  Frequency, nocturia, numbness, tingling, seizures,  Focal weakness, Loss of consciousness,  Tremor, insomnia, depression, anxiety, and suicidal ideation.        History   Social History  . Marital Status: Married    Spouse Name: Bobby Murillo    Number of Children: N/A  . Years of Education: N/A   Occupational History  . Not on file.   Social History Main Topics  . Smoking status: Never Smoker   . Smokeless tobacco: Never Used  . Alcohol Use: 8.4 oz/week    7 Glasses of wine, 7 Shots of liquor per week  . Drug Use: No  . Sexual Activity: Yes   Other Topics Concern  . Not on file   Social History Narrative  . No narrative on file    Objective:  Filed Vitals:   11/02/12 0912  BP: 128/70  Pulse: 50  Temp: 97.6 F (36.4 C)  Resp: 12     General appearance: alert, cooperative and appears stated age Ears: normal TM's and external ear canals both ears Throat: lips, mucosa, and tongue normal; teeth and gums normal Neck: no adenopathy, no carotid bruit, supple, symmetrical, trachea midline and thyroid not enlarged, symmetric, no tenderness/mass/nodules Back: symmetric, no curvature. ROM normal. No CVA tenderness. Lungs: clear to auscultation bilaterally Heart: regular rate and rhythm, S1,  S2 normal, no murmur, click, rub or gallop Abdomen: soft, non-tender; bowel sounds normal; no masses,  no organomegaly Pulses: 2+ and symmetric Skin: Skin color, texture, turgor normal. No rashes or lesions Lymph nodes: Cervical, supraclavicular, and axillary nodes normal.  Assessment and Plan:  Degenerative joint disease of knee Improved with weight loss and not limiting his daily activities. Continue when necessary NSAIDs and Tylenol.  Other and unspecified hyperlipidemia Untreated except for baby aspirin due to statin intolerance. He has no other risk factors and remains physically very active.  Overweight (BMI 25.0-29.9) Patient is clearly not  overweight by physical assessment, only by BMI. He has lost several pounds recently due to his wife's dietary changes.  Other malaise and fatigue Screening for thyroid disorder B12 deficiency and anemia were all negative. Patient's fatigue does not appear to be pathologic. He is 73 years old and is physicall very active. Reassurance provided.   Updated Medication List Outpatient Encounter Prescriptions as of 11/02/2012  Medication Sig Dispense Refill  . acetaminophen (TYLENOL) 325 MG tablet Take 650 mg by mouth every 6 (six) hours as needed.        . ALPRAZolam (XANAX) 0.5 MG tablet TAKE ONE TABLET BY MOUTH AT BEDTIME AS NEEDED FOR SLEEP  90 tablet  1  . aspirin 81 MG tablet Take 1 tablet by mouth daily. 1 tab by mouth daily      . lisinopril (PRINIVIL,ZESTRIL) 10 MG tablet TAKE ONE TABLET BY MOUTH EVERY DAY  90 tablet  3  . Multiple Vitamin (MULTIVITAMIN) tablet Take 1 tablet by mouth daily.        . naproxen (NAPROSYN) 250 MG tablet Take 250 mg by mouth as needed.        . polycarbophil (FIBERCON) 625 MG tablet Take 625 mg by mouth daily.        . cholecalciferol (VITAMIN D) 1000 UNITS tablet Take 1,000 Units by mouth daily.        . Glucosamine-Chondroitin (GLUCOSAMINE CHONDR COMPLEX PO) Take 2 tablets by mouth daily. tab by mouth daily      . ibuprofen (ADVIL,MOTRIN) 200 MG tablet Take 200 mg by mouth every 6 (six) hours as needed.        . TDaP (BOOSTRIX) 5-2.5-18.5 LF-MCG/0.5 injection Inject 0.5 mL into the muscle once.  0.5 mL  0  . [DISCONTINUED] omeprazole (PRILOSEC) 20 MG capsule Take 1 capsule (20 mg total) by mouth daily.  30 capsule  3   No facility-administered encounter medications on file as of 11/02/2012.     Orders Placed This Encounter  Procedures  . TSH + free T4  . B12  . Folate RBC  . CBC with Differential  . Comprehensive metabolic panel    Return in about 6 months (around 05/05/2013).

## 2012-11-03 ENCOUNTER — Encounter: Payer: Self-pay | Admitting: Internal Medicine

## 2012-11-03 DIAGNOSIS — Z789 Other specified health status: Secondary | ICD-10-CM | POA: Insufficient documentation

## 2012-11-03 DIAGNOSIS — R5381 Other malaise: Secondary | ICD-10-CM | POA: Insufficient documentation

## 2012-11-03 DIAGNOSIS — E663 Overweight: Secondary | ICD-10-CM | POA: Insufficient documentation

## 2012-11-03 LAB — TSH+FREE T4
Free T4: 1.37 ng/dL (ref 0.82–1.77)
TSH: 3.08 u[IU]/mL (ref 0.450–4.500)

## 2012-11-03 LAB — FOLATE RBC: RBC Folate: 1451 ng/mL (ref >=366)

## 2012-11-03 NOTE — Assessment & Plan Note (Signed)
Patient is clearly not overweight by physical assessment, only by BMI. He has lost several pounds recently due to his wife's dietary changes.

## 2012-11-03 NOTE — Assessment & Plan Note (Signed)
Untreated except for baby aspirin due to statin intolerance. He has no other risk factors and remains physically very active.

## 2012-11-03 NOTE — Assessment & Plan Note (Addendum)
Screening for thyroid disorder B12 deficiency and anemia were all negative. Patient's fatigue does not appear to be pathologic. He is 72 years old and is physicall very active. Reassurance provided.

## 2012-11-03 NOTE — Assessment & Plan Note (Signed)
Improved with weight loss and not limiting his daily activities. Continue when necessary NSAIDs and Tylenol.

## 2012-11-08 ENCOUNTER — Encounter: Payer: Self-pay | Admitting: *Deleted

## 2012-12-20 ENCOUNTER — Other Ambulatory Visit: Payer: Self-pay | Admitting: *Deleted

## 2012-12-20 MED ORDER — LISINOPRIL 10 MG PO TABS: ORAL_TABLET | ORAL | Status: DC

## 2012-12-20 NOTE — Telephone Encounter (Signed)
Refill sent.

## 2013-06-11 ENCOUNTER — Other Ambulatory Visit: Payer: Self-pay | Admitting: Internal Medicine

## 2013-06-12 NOTE — Telephone Encounter (Signed)
Refill

## 2013-06-13 NOTE — Telephone Encounter (Signed)
Ok to refill,  Authorized in epic 

## 2013-06-14 NOTE — Telephone Encounter (Signed)
Script faxed.

## 2013-11-08 ENCOUNTER — Encounter: Payer: Self-pay | Admitting: Adult Health

## 2013-11-08 ENCOUNTER — Ambulatory Visit (INDEPENDENT_AMBULATORY_CARE_PROVIDER_SITE_OTHER): Payer: Medicare Other | Admitting: Adult Health

## 2013-11-08 DIAGNOSIS — Z23 Encounter for immunization: Secondary | ICD-10-CM

## 2013-11-08 DIAGNOSIS — Z125 Encounter for screening for malignant neoplasm of prostate: Secondary | ICD-10-CM

## 2013-11-08 LAB — PSA, MEDICARE

## 2013-11-08 MED ORDER — ZOSTER VACCINE LIVE 19400 UNT/0.65ML ~~LOC~~ SOLR: 0.6500 mL | Freq: Once | SUBCUTANEOUS | Status: DC

## 2013-11-08 NOTE — Patient Instructions (Addendum)
  You had your Medicare Wellness Screening today.  You have an appointment with Dr. Derrel Nip next week. Please come fasting for blood work.  Today you received:   Prevnar pneumonia vaccine    Flu Shot  I have sent in a prescription for the shingles vaccine to your pharmacy. The Pharmacist will administer it.  I am also checking your PSA today. Please go to the lab prior to leaving the office.

## 2013-11-08 NOTE — Progress Notes (Signed)
Pre visit review using our clinic review tool, if applicable. No additional management support is needed unless otherwise documented below in the visit note. 

## 2013-11-08 NOTE — Progress Notes (Signed)
Subjective:    Bobby Murillo is a 73 y.o. male who presents for Medicare Annual/Subsequent preventive examination.   Preventive Screening-Counseling & Management  Tobacco History  Smoking status  . Never Smoker   Smokeless tobacco  . Never Used    Problems Prior to Visit 1.   Current Problems (verified) Patient Active Problem List   Diagnosis Date Noted  . Statin intolerance 11/03/2012  . Overweight (BMI 25.0-29.9) 11/03/2012  . Other malaise and fatigue 11/03/2012  . Degenerative joint disease of knee 02/15/2011  . Other and unspecified hyperlipidemia 02/15/2011  . prostate cancer   . Basal cell carcinoma     Medications Prior to Visit Current Outpatient Prescriptions on File Prior to Visit  Medication Sig Dispense Refill  . acetaminophen (TYLENOL) 325 MG tablet Take 650 mg by mouth every 6 (six) hours as needed.        . ALPRAZolam (XANAX) 0.5 MG tablet TAKE ONE TABLET BY MOUTH AT BEDTIME AS NEEDED FOR SLEEP  90 tablet  0  . aspirin 81 MG tablet Take 1 tablet by mouth daily. 1 tab by mouth daily      . Glucosamine-Chondroitin (GLUCOSAMINE CHONDR COMPLEX PO) Take 2 tablets by mouth daily. tab by mouth daily      . lisinopril (PRINIVIL,ZESTRIL) 10 MG tablet TAKE ONE TABLET BY MOUTH EVERY DAY  90 tablet  3  . naproxen (NAPROSYN) 250 MG tablet Take 250 mg by mouth as needed.        . polycarbophil (FIBERCON) 625 MG tablet Take 625 mg by mouth daily.        . TDaP (BOOSTRIX) 5-2.5-18.5 LF-MCG/0.5 injection Inject 0.5 mL into the muscle once.  0.5 mL  0  . cholecalciferol (VITAMIN D) 1000 UNITS tablet Take 1,000 Units by mouth daily.         No current facility-administered medications on file prior to visit.    Current Medications (verified) Current Outpatient Prescriptions  Medication Sig Dispense Refill  . acetaminophen (TYLENOL) 325 MG tablet Take 650 mg by mouth every 6 (six) hours as needed.        . ALPRAZolam (XANAX) 0.5 MG tablet TAKE ONE TABLET BY MOUTH AT  BEDTIME AS NEEDED FOR SLEEP  90 tablet  0  . aspirin 81 MG tablet Take 1 tablet by mouth daily. 1 tab by mouth daily      . Glucosamine-Chondroitin (GLUCOSAMINE CHONDR COMPLEX PO) Take 2 tablets by mouth daily. tab by mouth daily      . lisinopril (PRINIVIL,ZESTRIL) 10 MG tablet TAKE ONE TABLET BY MOUTH EVERY DAY  90 tablet  3  . naproxen (NAPROSYN) 250 MG tablet Take 250 mg by mouth as needed.        Marland Kitchen OVER THE COUNTER MEDICATION 1 capsule daily. Stool softener      . polycarbophil (FIBERCON) 625 MG tablet Take 625 mg by mouth daily.        . TDaP (BOOSTRIX) 5-2.5-18.5 LF-MCG/0.5 injection Inject 0.5 mL into the muscle once.  0.5 mL  0  . cholecalciferol (VITAMIN D) 1000 UNITS tablet Take 1,000 Units by mouth daily.         No current facility-administered medications for this visit.     Allergies (verified) Alfuzosin and Pravastatin sodium   PAST HISTORY  Family History Family History  Problem Relation Age of Onset  . Alzheimer's disease Mother   . Alzheimer's disease Father   . Stroke Father   . Alzheimer's disease Sister   .  Stroke Paternal Aunt   . Heart disease Paternal Uncle 75    Social History History  Substance Use Topics  . Smoking status: Never Smoker   . Smokeless tobacco: Never Used  . Alcohol Use: 8.4 oz/week    7 Glasses of wine, 7 Shots of liquor per week    Are there smokers in your home (other than you)?  No  Risk Factors Current exercise habits: Walking, tennis, golf  Dietary issues discussed: Follows fairly healthy diet   Cardiac risk factors: advanced age (older than 34 for men, 40 for women), dyslipidemia and male gender.  Depression Screen (Note: if answer to either of the following is "Yes", a more complete depression screening is indicated)   Q1: Over the past two weeks, have you felt down, depressed or hopeless? No  Q2: Over the past two weeks, have you felt little interest or pleasure in doing things? No  Have you lost interest or pleasure  in daily life? No  Do you often feel hopeless? No  Do you cry easily over simple problems? No  Activities of Daily Living In your present state of health, do you have any difficulty performing the following activities?:  Driving? No Managing money?  No Feeding yourself? No Getting from bed to chair? No Climbing a flight of stairs? No Preparing food and eating?: No Bathing or showering? No Getting dressed: No Getting to the toilet? No Using the toilet:No Moving around from place to place: No In the past year have you fallen or had a near fall?:Yes   Are you sexually active?  No  Do you have more than one partner?  No  Hearing Difficulties: No Do you often ask people to speak up or repeat themselves? No Do you experience ringing or noises in your ears? No Do you have difficulty understanding soft or whispered voices? No   Do you feel that you have a problem with memory? No  Do you often misplace items? No  Do you feel safe at home?  Yes  Cognitive Testing  Alert? Yes  Normal Appearance?Yes  Oriented to person? Yes  Place? Yes   Time? Yes  Recall of three objects?  Yes  Can perform simple calculations? Yes  Displays appropriate judgment?Yes  Can read the correct time from a watch face?Yes   Advanced Directives have been discussed with the patient? Yes   List the Names of Other Physician/Practitioners you currently use: 1.  Dr. Evorn Gong - Dermatology 2.  Salli Real, DDS 3.  [facility] 4.  Dr. Margaretmary Eddy - Orthopedic   Indicate any recent Medical Services you may have received from other than [facility] providers in the past year (date may be approximate).  Immunization History  Administered Date(s) Administered  . Influenza Split 02/13/2011  . Influenza-Unspecified 12/30/2012  . Pneumococcal Polysaccharide-23 02/13/2011    Screening Tests Health Maintenance  Topic Date Due  . Tetanus/tdap  06/14/1961  . Zostavax  06/15/2002  . Influenza Vaccine   10/14/2013  . Colonoscopy  10/27/2020  . Pneumococcal Polysaccharide Vaccine Age 13 And Over  Completed    All answers were reviewed with the patient and necessary referrals were made:  Gregorey Nabor, NP   11/08/2013   History reviewed: allergies, current medications, past family history, past medical history, past social history, past surgical history and problem list  Review of Systems No ROS - Medicare Wellness    Objective:     Vision by Snellen chart: right eye:20/30, left eye:20/30 There  were no vitals taken for this visit. There is no weight on file to calculate BMI.  No exam performed today, Medicare Wellness.     Assessment:     Patient presents for yearly preventative medicine examination. Medicare questionnaire was completed  All immunizations and health maintenance protocols were reviewed with the patient and needed orders were placed.  Appropriate screening laboratory values were ordered for the patient including screening of hyperlipidemia, renal function and hepatic function. If indicated by BPH, a PSA was ordered. Pt has hx of prostate cancer.  Medication reconciliation,  past medical history, social history, problem list and allergies were reviewed in detail with the patient  Goals were established with regard to weight loss, exercise, and  diet in compliance with medications  End of life planning was discussed.       Plan:     During the course of the visit the patient was educated and counseled about appropriate screening and preventive services including:    Pneumococcal vaccine   Influenza vaccine  Prostate cancer screening  Advanced directives: has an advanced directive - a copy has been provided  Diet review for nutrition referral? Yes ____  Not Indicated __x__   Patient Instructions (the written plan) was given to the patient.  Medicare Attestation I have personally reviewed: The patient's medical and social history Their use of  alcohol, tobacco or illicit drugs Their current medications and supplements The patient's functional ability including ADLs,fall risks, home safety risks, cognitive, and hearing and visual impairment Diet and physical activities Evidence for depression or mood disorders  The patient's weight, height, BMI, and visual acuity have been recorded in the chart.  I have made referrals, counseling, and provided education to the patient based on review of the above and I have provided the patient with a written personalized care plan for preventive services.     Gael Londo, NP   11/08/2013

## 2013-11-15 ENCOUNTER — Encounter: Payer: Self-pay | Admitting: Internal Medicine

## 2013-11-15 ENCOUNTER — Ambulatory Visit (INDEPENDENT_AMBULATORY_CARE_PROVIDER_SITE_OTHER): Payer: Medicare Other | Admitting: Internal Medicine

## 2013-11-15 VITALS — BP 138/68 | HR 52 | Temp 97.5°F | Resp 14 | Ht 72.75 in | Wt 198.2 lb

## 2013-11-15 DIAGNOSIS — E785 Hyperlipidemia, unspecified: Secondary | ICD-10-CM

## 2013-11-15 DIAGNOSIS — E663 Overweight: Secondary | ICD-10-CM

## 2013-11-15 DIAGNOSIS — C801 Malignant (primary) neoplasm, unspecified: Secondary | ICD-10-CM

## 2013-11-15 DIAGNOSIS — E559 Vitamin D deficiency, unspecified: Secondary | ICD-10-CM

## 2013-11-15 DIAGNOSIS — M170 Bilateral primary osteoarthritis of knee: Secondary | ICD-10-CM

## 2013-11-15 DIAGNOSIS — R5381 Other malaise: Secondary | ICD-10-CM

## 2013-11-15 DIAGNOSIS — M1710 Unilateral primary osteoarthritis, unspecified knee: Secondary | ICD-10-CM

## 2013-11-15 DIAGNOSIS — R5383 Other fatigue: Secondary | ICD-10-CM

## 2013-11-15 DIAGNOSIS — Z79899 Other long term (current) drug therapy: Secondary | ICD-10-CM

## 2013-11-15 LAB — COMPREHENSIVE METABOLIC PANEL
ALK PHOS: 85 U/L (ref 39–117)
ALT: 34 U/L (ref 0–53)
AST: 31 U/L (ref 0–37)
Albumin: 4 g/dL (ref 3.5–5.2)
BILIRUBIN TOTAL: 1.6 mg/dL — AB (ref 0.2–1.2)
BUN: 19 mg/dL (ref 6–23)
CO2: 26 meq/L (ref 19–32)
Calcium: 9.9 mg/dL (ref 8.4–10.5)
Chloride: 103 mEq/L (ref 96–112)
Creatinine, Ser: 1.3 mg/dL (ref 0.4–1.5)
GFR: 57.77 mL/min — AB (ref >60.00)
GLUCOSE: 95 mg/dL (ref 70–99)
Potassium: 4.9 mEq/L (ref 3.5–5.1)
Sodium: 139 mEq/L (ref 135–145)
Total Protein: 6.9 g/dL (ref 6.0–8.3)

## 2013-11-15 LAB — CBC WITH DIFFERENTIAL/PLATELET
BASOS ABS: 0 10*3/uL (ref 0.0–0.1)
BASOS PCT: 0.7 % (ref 0.0–3.0)
Eosinophils Absolute: 0.2 10*3/uL (ref 0.0–0.7)
Eosinophils Relative: 3 % (ref 0.0–5.0)
HCT: 50.1 % (ref 39.0–52.0)
HEMOGLOBIN: 17.2 g/dL — AB (ref 13.0–17.0)
Lymphocytes Relative: 15.9 % (ref 12.0–46.0)
Lymphs Abs: 0.9 10*3/uL (ref 0.7–4.0)
MCHC: 34.3 g/dL (ref 30.0–36.0)
MCV: 91 fl (ref 78.0–100.0)
MONOS PCT: 13.6 % — AB (ref 3.0–12.0)
Monocytes Absolute: 0.7 10*3/uL (ref 0.1–1.0)
NEUTROS ABS: 3.6 10*3/uL (ref 1.4–7.7)
Neutrophils Relative %: 66.8 % (ref 43.0–77.0)
Platelets: 248 10*3/uL (ref 150.0–400.0)
RBC: 5.51 Mil/uL (ref 4.22–5.81)
RDW: 13.1 % (ref 11.5–15.5)
WBC: 5.4 10*3/uL (ref 4.0–10.5)

## 2013-11-15 LAB — LIPID PANEL
CHOL/HDL RATIO: 5
Cholesterol: 284 mg/dL — ABNORMAL HIGH (ref 0–200)
HDL: 61 mg/dL (ref >39.00)
LDL CALC: 197 mg/dL — AB (ref 0–99)
NonHDL: 223
TRIGLYCERIDES: 128 mg/dL (ref 0.0–149.0)
VLDL: 25.6 mg/dL (ref 0.0–40.0)

## 2013-11-15 LAB — TSH: TSH: 2.14 u[IU]/mL (ref 0.35–4.50)

## 2013-11-15 LAB — VITAMIN D 25 HYDROXY (VIT D DEFICIENCY, FRACTURES): VITD: 36.39 ng/mL (ref 30.00–100.00)

## 2013-11-15 MED ORDER — ALPRAZOLAM 0.5 MG PO TABS: ORAL_TABLET | ORAL | Status: DC

## 2013-11-15 MED ORDER — LEVOFLOXACIN 500 MG PO TABS: 500.0000 mg | ORAL_TABLET | Freq: Every day | ORAL | Status: DC

## 2013-11-15 NOTE — Progress Notes (Signed)
Patient ID: Bobby Murillo, male   DOB: 1940/11/30, 74 y.o.   [name]: [id number]  Patient Active Problem List   Diagnosis Date Noted  . Statin intolerance 11/03/2012  . Other malaise and fatigue 11/03/2012  . Degenerative joint disease of knee 02/15/2011  . Other and unspecified hyperlipidemia 02/15/2011  . prostate cancer   . Basal cell carcinoma     Subjective:  CC:   Chief Complaint  Patient presents with  . Follow-up    general check up    HPI:   Bobby Murillo is a 73 y.o. male who presents for Follow up on chronic conditions, including OA knees, hyperlipidemia, malaise, and weight gain.  He feels generally well,  Still plays tennis several times per week with some increased knee pain without swelling noted afterward.  Sleeping well,  Denies nocturia and urinary incontinence s/p radical prostatctomy.   Planning a 2 week trip to [place] with wife Bobby Murillo in the next several months.    Past Medical History  Diagnosis Date  . Anxiety   . Hyperlipidemia   . Abnormal LFTs   . prostate cancer 2010    Dr. Ronny Bacon  . Basal cell carcinoma July 2012    left calf     Past Surgical History  Procedure Laterality Date  . Prostatectomy  11/2008    transurethral, radical, Dr. Darcus Austin       The following portions of the patient's history were reviewed and updated as appropriate: Allergies, current medications, and problem list.    Review of Systems:   Patient denies headache, fevers, malaise, unintentional weight loss, skin rash, eye pain, sinus congestion and sinus pain, sore throat, dysphagia,  hemoptysis , cough, dyspnea, wheezing, chest pain, palpitations, orthopnea, edema, abdominal pain, nausea, melena, diarrhea, constipation, flank pain, dysuria, hematuria, urinary  Frequency, nocturia, numbness, tingling, seizures,  Focal weakness, Loss of consciousness,  Tremor, insomnia, depression, anxiety, and suicidal ideation.     History   Social History  . Marital Status: Married   Spouse Name: N/A    Number of Children: N/A  . Years of Education: N/A   Occupational History  . Not on file.   Social History Main Topics  . Smoking status: Never Smoker   . Smokeless tobacco: Never Used  . Alcohol Use: 8.4 oz/week    7 Glasses of wine, 7 Shots of liquor per week  . Drug Use: No  . Sexual Activity: Yes   Other Topics Concern  . Not on file   Social History Narrative  . No narrative on file    Objective:  Filed Vitals:   11/15/13 1004  BP: 138/68  Pulse: 52  Temp: 97.5 F (36.4 C)  Resp: 14     General appearance: alert, cooperative and appears stated age Ears: normal TM's and external ear canals both ears Throat: lips, mucosa, and tongue normal; teeth and gums normal Neck: no adenopathy, no carotid bruit, supple, symmetrical, trachea midline and thyroid not enlarged, symmetric, no tenderness/mass/nodules Back: symmetric, no curvature. ROM normal. No CVA tenderness. Lungs: clear to auscultation bilaterally Heart: regular rate and rhythm, S1, S2 normal, no murmur, click, rub or gallop Abdomen: soft, non-tender; bowel sounds normal; no masses,  no organomegaly Pulses: 2+ and symmetric Skin: Skin color, texture, turgor normal. No rashes or lesions Lymph nodes: Cervical, supraclavicular, and axillary nodes normal.  Assessment and Plan:  Other and unspecified hyperlipidemia Untreated except for baby aspirin due to statin intolerance. He has no other risk factors and  remains physically very active.  Lab Results  Component Value Date   CHOL 284* 11/15/2013   HDL 61.00 11/15/2013   LDLCALC 197* 11/15/2013   TRIG 128.0 11/15/2013   CHOLHDL 5 11/15/2013     Degenerative joint disease of knee contineu daily use of glucosamine supplements and prn naproxen.   prostate cancer His PSA remains undetectable since radical prostatectomy.  Lab Results  Component Value Date   PSA 0.00 Repeated and verified X2.* 11/08/2013   PSA <0.1 10/23/2011     Overweight  (BMI 25.0-29.9) His BMI is minimally over 25 and he is physically active and in shape.     Updated Medication List Outpatient Encounter Prescriptions as of 11/15/2013  Medication Sig  . acetaminophen (TYLENOL) 325 MG tablet Take 650 mg by mouth every 6 (six) hours as needed.    . ALPRAZolam (XANAX) 0.5 MG tablet TAKE ONE TABLET BY MOUTH AT BEDTIME AS NEEDED FOR SLEEP  . aspirin 81 MG tablet Take 1 tablet by mouth daily. 1 tab by mouth daily  . Glucosamine-Chondroitin (GLUCOSAMINE CHONDR COMPLEX PO) Take 2 tablets by mouth daily. tab by mouth daily  . lisinopril (PRINIVIL,ZESTRIL) 10 MG tablet TAKE ONE TABLET BY MOUTH EVERY DAY  . naproxen (NAPROSYN) 250 MG tablet Take 250 mg by mouth as needed.    Marland Kitchen OVER THE COUNTER MEDICATION 1 capsule daily. Stool softener  . polycarbophil (FIBERCON) 625 MG tablet Take 625 mg by mouth daily.    . [DISCONTINUED] ALPRAZolam (XANAX) 0.5 MG tablet TAKE ONE TABLET BY MOUTH AT BEDTIME AS NEEDED FOR SLEEP  . cholecalciferol (VITAMIN D) 1000 UNITS tablet Take 1,000 Units by mouth daily.    Marland Kitchen levofloxacin (LEVAQUIN) 500 MG tablet Take 1 tablet (500 mg total) by mouth daily.  Marland Kitchen zoster vaccine live, PF, (ZOSTAVAX) 22336 UNT/0.65ML injection Inject 19,400 Units into the skin once.  . [DISCONTINUED] TDaP (BOOSTRIX) 5-2.5-18.5 LF-MCG/0.5 injection Inject 0.5 mL into the muscle once.     Orders Placed This Encounter  Procedures  . CBC with Differential  . Comprehensive metabolic panel  . TSH  . Lipid panel  . Vit D  25 hydroxy (rtn osteoporosis monitoring)    No Follow-up on file.

## 2013-11-15 NOTE — Progress Notes (Signed)
Pre-visit discussion using our clinic review tool. No additional management support is needed unless otherwise documented below in the visit note.  

## 2013-11-15 NOTE — Patient Instructions (Addendum)
I an sending you off with a 14 day supply of the antibiotic  levaquin to take with you on  your [place] trip.    It will treat sinusitis,  Ear infections,.  Bronchitis and Urinary tract infections (share with [name])

## 2013-11-17 ENCOUNTER — Encounter: Payer: Self-pay | Admitting: Internal Medicine

## 2013-11-18 ENCOUNTER — Encounter: Payer: Self-pay | Admitting: Internal Medicine

## 2013-11-18 NOTE — Assessment & Plan Note (Signed)
contineu daily use of glucosamine supplements and prn naproxen.  

## 2013-11-18 NOTE — Assessment & Plan Note (Signed)
His BMI is minimally over 25 and he is physically active and in shape.

## 2013-11-18 NOTE — Assessment & Plan Note (Signed)
Untreated except for baby aspirin due to statin intolerance. He has no other risk factors and remains physically very active.  Lab Results  Component Value Date   CHOL 284* 11/15/2013   HDL 61.00 11/15/2013   LDLCALC 197* 11/15/2013   TRIG 128.0 11/15/2013   CHOLHDL 5 11/15/2013

## 2013-11-18 NOTE — Assessment & Plan Note (Addendum)
His PSA remains undetectable since radical prostatectomy.  Lab Results  Component Value Date   PSA 0.00 Repeated and verified X2.* 11/08/2013   PSA <0.1 10/23/2011

## 2013-12-11 ENCOUNTER — Encounter: Payer: Self-pay | Admitting: Internal Medicine

## 2013-12-13 ENCOUNTER — Other Ambulatory Visit: Payer: Self-pay | Admitting: Internal Medicine

## 2014-06-13 ENCOUNTER — Other Ambulatory Visit: Payer: Self-pay | Admitting: Internal Medicine

## 2014-10-29 ENCOUNTER — Other Ambulatory Visit: Payer: Self-pay | Admitting: Internal Medicine

## 2014-10-29 DIAGNOSIS — E785 Hyperlipidemia, unspecified: Secondary | ICD-10-CM

## 2014-10-29 DIAGNOSIS — C61 Malignant neoplasm of prostate: Secondary | ICD-10-CM

## 2014-10-29 DIAGNOSIS — R5383 Other fatigue: Secondary | ICD-10-CM

## 2014-10-29 NOTE — Telephone Encounter (Signed)
Script faxed OV setup and lab appointment made.

## 2014-10-29 NOTE — Telephone Encounter (Signed)
Last OV 9.2.15, last refill 1.3.16.  please advise refill

## 2014-10-29 NOTE — Telephone Encounter (Signed)
Refill for 30 days only.  Has not been seen in one year so patient needs a  30 minute visit AND fasting labs prior to visit

## 2014-11-21 ENCOUNTER — Ambulatory Visit: Payer: Medicare Other | Admitting: Internal Medicine

## 2014-11-28 ENCOUNTER — Other Ambulatory Visit (INDEPENDENT_AMBULATORY_CARE_PROVIDER_SITE_OTHER): Payer: Medicare Other

## 2014-11-28 DIAGNOSIS — R5383 Other fatigue: Secondary | ICD-10-CM

## 2014-11-28 DIAGNOSIS — C61 Malignant neoplasm of prostate: Secondary | ICD-10-CM

## 2014-11-28 DIAGNOSIS — E785 Hyperlipidemia, unspecified: Secondary | ICD-10-CM

## 2014-11-28 LAB — CBC WITH DIFFERENTIAL/PLATELET
BASOS ABS: 0 10*3/uL (ref 0.0–0.1)
Basophils Relative: 0.7 % (ref 0.0–3.0)
EOS ABS: 0.3 10*3/uL (ref 0.0–0.7)
Eosinophils Relative: 5.9 % — ABNORMAL HIGH (ref 0.0–5.0)
HEMATOCRIT: 49.8 % (ref 39.0–52.0)
Hemoglobin: 16.9 g/dL (ref 13.0–17.0)
LYMPHS PCT: 21.2 % (ref 12.0–46.0)
Lymphs Abs: 1.2 10*3/uL (ref 0.7–4.0)
MCHC: 34 g/dL (ref 30.0–36.0)
MCV: 91.6 fl (ref 78.0–100.0)
MONO ABS: 0.7 10*3/uL (ref 0.1–1.0)
Monocytes Relative: 12.6 % — ABNORMAL HIGH (ref 3.0–12.0)
NEUTROS ABS: 3.3 10*3/uL (ref 1.4–7.7)
Neutrophils Relative %: 59.6 % (ref 43.0–77.0)
Platelets: 256 10*3/uL (ref 150.0–400.0)
RBC: 5.44 Mil/uL (ref 4.22–5.81)
RDW: 13.6 % (ref 11.5–15.5)
WBC: 5.5 10*3/uL (ref 4.0–10.5)

## 2014-11-28 LAB — COMPREHENSIVE METABOLIC PANEL
ALT: 41 U/L (ref 0–53)
AST: 34 U/L (ref 0–37)
Albumin: 4.1 g/dL (ref 3.5–5.2)
Alkaline Phosphatase: 89 U/L (ref 39–117)
BILIRUBIN TOTAL: 1 mg/dL (ref 0.2–1.2)
BUN: 22 mg/dL (ref 6–23)
CALCIUM: 9.5 mg/dL (ref 8.4–10.5)
CHLORIDE: 106 meq/L (ref 96–112)
CO2: 26 meq/L (ref 19–32)
Creatinine, Ser: 1.26 mg/dL (ref 0.40–1.50)
GFR: 59.72 mL/min — AB (ref >60.00)
Glucose, Bld: 91 mg/dL (ref 70–99)
Potassium: 4.5 mEq/L (ref 3.5–5.1)
Sodium: 140 mEq/L (ref 135–145)
Total Protein: 6.6 g/dL (ref 6.0–8.3)

## 2014-11-28 LAB — LIPID PANEL
CHOLESTEROL: 255 mg/dL — AB (ref 0–200)
HDL: 61.7 mg/dL (ref >39.00)
LDL CALC: 176 mg/dL — AB (ref 0–99)
NonHDL: 193
TRIGLYCERIDES: 83 mg/dL (ref 0.0–149.0)
Total CHOL/HDL Ratio: 4
VLDL: 16.6 mg/dL (ref 0.0–40.0)

## 2014-11-28 LAB — PSA, MEDICARE: PSA: 0 ng/ml — ABNORMAL LOW (ref 0.10–4.00)

## 2014-11-28 LAB — TSH: TSH: 2.87 u[IU]/mL (ref 0.35–4.50)

## 2014-11-30 ENCOUNTER — Ambulatory Visit (INDEPENDENT_AMBULATORY_CARE_PROVIDER_SITE_OTHER): Payer: Medicare Other | Admitting: Internal Medicine

## 2014-11-30 ENCOUNTER — Encounter: Payer: Self-pay | Admitting: Internal Medicine

## 2014-11-30 VITALS — BP 128/72 | HR 52 | Temp 97.6°F | Resp 12 | Ht 73.0 in | Wt 200.5 lb

## 2014-11-30 DIAGNOSIS — E785 Hyperlipidemia, unspecified: Secondary | ICD-10-CM | POA: Diagnosis not present

## 2014-11-30 DIAGNOSIS — Z23 Encounter for immunization: Secondary | ICD-10-CM | POA: Diagnosis not present

## 2014-11-30 DIAGNOSIS — Z0000 Encounter for general adult medical examination without abnormal findings: Secondary | ICD-10-CM | POA: Diagnosis not present

## 2014-11-30 MED ORDER — ALPRAZOLAM 0.5 MG PO TABS: 0.5000 mg | ORAL_TABLET | Freq: Every evening | ORAL | Status: DC | PRN

## 2014-11-30 NOTE — Progress Notes (Signed)
Patient ID: Bobby Murillo, male    DOB: 23-Feb-1941  Age: 74 y.o. [name]: [id number]  The patient is here for annual Medicare wellness examination and management of other chronic and acute problems.   The risk factors are reflected in the social history.  The roster of all physicians providing medical care to patient - is listed in the Snapshot section of the chart.  Activities of daily living:  The patient is 100% independent in all ADLs: dressing, toileting, feeding as well as independent mobility  Home safety : The patient has smoke detectors in the home. They wear seatbelts.  There are no firearms at home. There is no violence in the home.   There is no risks for hepatitis, STDs or HIV. There is no   history of blood transfusion. They have no travel history to infectious disease endemic areas of the world.  The patient has seen their dentist in the last six month. They have seen their eye doctor in the last year. They admit to slight hearing difficulty with regard to whispered voices and some television programs.  They have deferred audiologic testing in the last year.  They do not  have excessive sun exposure. Discussed the need for sun protection: hats, long sleeves and use of sunscreen if there is significant sun exposure.   Diet: the importance of a healthy diet is discussed. They do have a healthy diet.  The benefits of regular aerobic exercise were discussed. She walks 4 times per week ,  20 minutes.   Depression screen: there are no signs or vegative symptoms of depression- irritability, change in appetite, anhedonia, sadness/tearfullness.  Cognitive assessment: the patient manages all their financial and personal affairs and is actively engaged. They could relate day,date,year and events; recalled 2/3 objects at 3 minutes; performed clock-face test normally.  The following portions of the patient's history were reviewed and updated as appropriate: allergies, current medications, past  family history, past medical history,  past surgical history, past social history  and problem list.  Visual acuity was not assessed per patient preference since she has regular follow up with her ophthalmologist. Hearing and body mass index were assessed and reviewed.   During the course of the visit the patient was educated and counseled about appropriate screening and preventive services including : fall prevention , diabetes screening, nutrition counseling, colorectal cancer screening, and recommended immunizations.    CC: The primary encounter diagnosis was Encounter for immunization. Diagnoses of Encounter for Medicare annual wellness exam and Hyperlipidemia LDL goal <100 were also pertinent to this visit.   New onset back pain for several months  Around L3,  Improved with naproxen  History Bobby Murillo has a past medical history of Anxiety; Hyperlipidemia; Abnormal LFTs; prostate cancer (2010); and Basal cell carcinoma (July 2012).   He has past surgical history that includes Prostatectomy (11/2008).   His family history includes Alzheimer's disease in his father, mother, and sister; Heart disease (age of onset: 46) in his paternal uncle; Stroke in his father and paternal aunt.He reports that he has never smoked. He has never used smokeless tobacco. He reports that he drinks about 8.4 oz of alcohol per week. He reports that he does not use illicit drugs.  Outpatient Prescriptions Prior to Visit  Medication Sig Dispense Refill  . acetaminophen (TYLENOL) 325 MG tablet Take 650 mg by mouth every 6 (six) hours as needed.      Marland Kitchen aspirin 81 MG tablet Take 1 tablet by mouth daily. 1  tab by mouth daily    . Glucosamine-Chondroitin (GLUCOSAMINE CHONDR COMPLEX PO) Take 2 tablets by mouth daily. tab by mouth daily    . lisinopril (PRINIVIL,ZESTRIL) 10 MG tablet TAKE ONE TABLET BY MOUTH ONCE DAILY 90 tablet 1  . naproxen (NAPROSYN) 250 MG tablet Take 250 mg by mouth as needed.      Marland Kitchen OVER THE COUNTER  MEDICATION 1 capsule daily. Stool softener    . polycarbophil (FIBERCON) 625 MG tablet Take 625 mg by mouth daily.      Marland Kitchen ALPRAZolam (XANAX) 0.5 MG tablet TAKE ONE TABLET BY MOUTH AT BEDTIME AS NEEDED FOR SLEEP 30 tablet 0  . cholecalciferol (VITAMIN D) 1000 UNITS tablet Take 1,000 Units by mouth daily.      Marland Kitchen zoster vaccine live, PF, (ZOSTAVAX) 38250 UNT/0.65ML injection Inject 19,400 Units into the skin once. (Patient not taking: Reported on 11/30/2014) 1 each 0  . levofloxacin (LEVAQUIN) 500 MG tablet Take 1 tablet (500 mg total) by mouth daily. 14 tablet 0   No facility-administered medications prior to visit.    Review of Systems  Patient denies headache, fevers, malaise, unintentional weight loss, skin rash, eye pain, sinus congestion and sinus pain, sore throat, dysphagia,  hemoptysis , cough, dyspnea, wheezing, chest pain, palpitations, orthopnea, edema, abdominal pain, nausea, melena, diarrhea, constipation, flank pain, dysuria, hematuria, urinary  Frequency, nocturia, numbness, tingling, seizures,  Focal weakness, Loss of consciousness,  Tremor, insomnia, depression, anxiety, and suicidal ideation.      Objective:  BP 128/72 mmHg  Pulse 52  Temp(Src) 97.6 F (36.4 C) (Oral)  Resp 12  Ht 6\' 1"  (1.854 m)  Wt 200 lb 8 oz (90.946 kg)  BMI 26.46 kg/m2  SpO2 96%  Physical Exam   General appearance: alert, cooperative and appears stated age Ears: normal TM's and external ear canals both ears Throat: lips, mucosa, and tongue normal; teeth and gums normal Neck: no adenopathy, no carotid bruit, supple, symmetrical, trachea midline and thyroid not enlarged, symmetric, no tenderness/mass/nodules Back: symmetric, no curvature. ROM normal. No CVA tenderness. Lungs: clear to auscultation bilaterally Heart: regular rate and rhythm, S1, S2 normal, no murmur, click, rub or gallop Abdomen: soft, non-tender; bowel sounds normal; no masses,  no organomegaly Pulses: 2+ and symmetric Skin:  Skin color, texture, turgor normal. No rashes or lesions Lymph nodes: Cervical, supraclavicular, and axillary nodes normal.    Assessment & Plan:   Problem List Items Addressed This Visit      Unprioritized   Hyperlipidemia LDL goal <100    Untreated except for baby aspirin due to statin intolerance. He has no other risk factors and remains physically very active.  Lab Results  Component Value Date   CHOL 255* 11/28/2014   HDL 61.70 11/28/2014   LDLCALC 176* 11/28/2014   TRIG 83.0 11/28/2014   CHOLHDL 4 11/28/2014           Encounter for Medicare annual wellness exam    Annual Medicare wellness  exam was done as well as a comprehensive physical exam and management of acute and chronic conditions .  During the course of the visit the patient was educated and counseled about appropriate screening and preventive services including : fall prevention , diabetes screening, nutrition counseling, colorectal cancer screening, and recommended immunizations.  Printed recommendations for health maintenance screenings was given.        Other Visit Diagnoses    Encounter for immunization    -  Primary  I have discontinued Mr. Bermingham's levofloxacin. I have also changed his ALPRAZolam. Additionally, I am having him maintain his acetaminophen, naproxen, cholecalciferol, polycarbophil, Glucosamine-Chondroitin (GLUCOSAMINE CHONDR COMPLEX PO), aspirin, OVER THE COUNTER MEDICATION, zoster vaccine live (PF), and lisinopril.  Meds ordered this encounter  Medications  . ALPRAZolam (XANAX) 0.5 MG tablet    Sig: Take 1 tablet (0.5 mg total) by mouth at bedtime as needed. for sleep    Dispense:  30 tablet    Refill:  5    Medications Discontinued During This Encounter  Medication Reason  . levofloxacin (LEVAQUIN) 500 MG tablet Completed Course  . ALPRAZolam (XANAX) 0.5 MG tablet Reorder    Follow-up: Return in about 6 months (around 05/30/2015) for plesae refund copay ,  annual exam done  today .   Crecencio Mc, MD

## 2014-11-30 NOTE — Patient Instructions (Signed)

## 2014-11-30 NOTE — Progress Notes (Signed)
Pre-visit discussion using our clinic review tool. No additional management support is needed unless otherwise documented below in the visit note.  

## 2014-12-02 ENCOUNTER — Encounter: Payer: Self-pay | Admitting: Internal Medicine

## 2014-12-02 DIAGNOSIS — Z0000 Encounter for general adult medical examination without abnormal findings: Secondary | ICD-10-CM | POA: Insufficient documentation

## 2014-12-02 NOTE — Assessment & Plan Note (Signed)
Untreated except for baby aspirin due to statin intolerance. He has no other risk factors and remains physically very active.  Lab Results  Component Value Date   CHOL 255* 11/28/2014   HDL 61.70 11/28/2014   LDLCALC 176* 11/28/2014   TRIG 83.0 11/28/2014   CHOLHDL 4 11/28/2014

## 2014-12-02 NOTE — Assessment & Plan Note (Signed)

## 2014-12-16 ENCOUNTER — Other Ambulatory Visit: Payer: Self-pay | Admitting: Internal Medicine

## 2015-03-07 ENCOUNTER — Other Ambulatory Visit: Payer: Self-pay | Admitting: Internal Medicine

## 2015-05-13 DIAGNOSIS — Z85828 Personal history of other malignant neoplasm of skin: Secondary | ICD-10-CM | POA: Diagnosis not present

## 2015-05-13 DIAGNOSIS — L570 Actinic keratosis: Secondary | ICD-10-CM | POA: Diagnosis not present

## 2015-05-29 ENCOUNTER — Encounter: Payer: Self-pay | Admitting: Internal Medicine

## 2015-05-29 ENCOUNTER — Ambulatory Visit (INDEPENDENT_AMBULATORY_CARE_PROVIDER_SITE_OTHER): Payer: Medicare Other | Admitting: Internal Medicine

## 2015-05-29 VITALS — BP 118/70 | HR 42 | Temp 97.4°F | Resp 12 | Ht 73.0 in | Wt 203.2 lb

## 2015-05-29 DIAGNOSIS — I10 Essential (primary) hypertension: Secondary | ICD-10-CM | POA: Diagnosis not present

## 2015-05-29 DIAGNOSIS — E785 Hyperlipidemia, unspecified: Secondary | ICD-10-CM

## 2015-05-29 DIAGNOSIS — R001 Bradycardia, unspecified: Secondary | ICD-10-CM

## 2015-05-29 LAB — LIPID PANEL
CHOLESTEROL: 271 mg/dL — AB (ref 0–200)
HDL: 57.1 mg/dL (ref >39.00)
LDL Cholesterol: 194 mg/dL — ABNORMAL HIGH (ref 0–99)
NonHDL: 213.52
TRIGLYCERIDES: 96 mg/dL (ref 0.0–149.0)
Total CHOL/HDL Ratio: 5
VLDL: 19.2 mg/dL (ref 0.0–40.0)

## 2015-05-29 LAB — COMPREHENSIVE METABOLIC PANEL
ALK PHOS: 87 U/L (ref 39–117)
ALT: 27 U/L (ref 0–53)
AST: 25 U/L (ref 0–37)
Albumin: 4.1 g/dL (ref 3.5–5.2)
BILIRUBIN TOTAL: 1.3 mg/dL — AB (ref 0.2–1.2)
BUN: 22 mg/dL (ref 6–23)
CALCIUM: 9.5 mg/dL (ref 8.4–10.5)
CO2: 28 meq/L (ref 19–32)
Chloride: 102 mEq/L (ref 96–112)
Creatinine, Ser: 1.32 mg/dL (ref 0.40–1.50)
GFR: 56.52 mL/min — AB (ref >60.00)
Glucose, Bld: 91 mg/dL (ref 70–99)
POTASSIUM: 4.8 meq/L (ref 3.5–5.1)
Sodium: 138 mEq/L (ref 135–145)
TOTAL PROTEIN: 6.5 g/dL (ref 6.0–8.3)

## 2015-05-29 LAB — TSH: TSH: 2.47 u[IU]/mL (ref 0.35–4.50)

## 2015-05-29 LAB — MAGNESIUM: Magnesium: 2.2 mg/dL (ref 1.5–2.5)

## 2015-05-29 MED ORDER — LISINOPRIL 5 MG PO TABS: 5.0000 mg | ORAL_TABLET | Freq: Every day | ORAL | Status: DC

## 2015-05-29 NOTE — Patient Instructions (Signed)
Your heart rate is slower than usual.  This can be a sign of a conduction problem within the heart.    I am referring you to [facility] cardiology for follow up .  There is very likely nothing to be done at this point since you have not had a  Syncopal episode (fainting spell),  But it's a good idea to have your heart checked out  I am reducing your lisinopril dose to 5 mg daily to increase your cardiac output given your slow heart rate.  This may help with your fatigue.   Bradycardia Bradycardia is a slower-than-normal heart rate. A normal resting heart rate for an adult ranges from 60 to 100 beats per minute. With bradycardia, the resting heart rate is less than 60 beats per minute. Bradycardia is a problem if your heart cannot pump enough oxygen-rich blood through your body. Bradycardia is not a problem for everyone. For some healthy adults, a slow resting heart rate is normal.  CAUSES  Bradycardia may be caused by:  A problem with the heart's electrical system, such as heart block.  A problem with the heart's natural pacemaker (sinus node).  Heart disease, damage, or infection.  Certain medicines that treat heart conditions.  Certain conditions, such as hypothyroidism and obstructive sleep apnea. RISK FACTORS  Risk factors include:  Being 16 or older.  Having high blood pressure (hypertension), high cholesterol (hyperlipidemia), or diabetes.  Drinking heavily, using tobacco products, or using drugs.  Being stressed. SIGNS AND SYMPTOMS  Signs and symptoms include:  Light-headedness.  Faintingor near fainting.  Fatigue and weakness.  Shortness of breath.  Chest pain (angina).  Drowsiness.  Confusion.  Dizziness. DIAGNOSIS  Diagnosis of bradycardia may include:  A physical exam.  An electrocardiogram (ECG).  Blood tests. TREATMENT  Treatment for bradycardia may include:  Treatment of an underlying condition.  Pacemaker placement. A pacemaker is a small,  battery-powered device that is placed under the skin and is programmed to sense your heartbeats. If your heart rate is lower than the programmed rate, the pacemaker will pace your heart.  Changing your medicines or dosages. HOME CARE INSTRUCTIONS  Take medicines only as directed by your health care provider.  Manage any health conditions that contribute to bradycardia as directed by your health care provider.  Follow a heart-healthy diet. A dietitian can help educate you on healthy food options and changes.  Follow an exercise program approved by your health care provider.  Maintain a healthy weight. Lose weight as approved by your health care provider.  Do not use tobacco products, including cigarettes, chewing tobacco, or electronic cigarettes. If you need help quitting, ask your health care provider.  Do not use illegal drugs.  Limit alcohol intake to no more than 1 drink per day for nonpregnant women and 2 drinks per day for men. One drink equals 12 ounces of beer, 5 ounces of wine, or 1 ounces of hard liquor.  Keep all follow-up visits as directed by your health care provider. This is important. SEEK MEDICAL CARE IF:  You feel light-headed or dizzy.  You almost faint.  You feel weak or are easily fatigued during physical activity.  You experience confusion or have memory problems. SEEK IMMEDIATE MEDICAL CARE IF:   You faint.  You have an irregular heartbeat.  You have chest pain.  You have trouble breathing. MAKE SURE YOU:   Understand these instructions.  Will watch your condition.  Will get help right away if you are not  doing well or get worse.   This information is not intended to replace advice given to you by your health care provider. Make sure you discuss any questions you have with your health care provider.   Document Released: 11/22/2001 Document Revised: 03/23/2014 Document Reviewed: 06/07/2013 Elsevier Interactive Patient Education [employer].

## 2015-05-29 NOTE — Progress Notes (Signed)
Pre-visit discussion using our clinic review tool. No additional management support is needed unless otherwise documented below in the visit note.  

## 2015-05-29 NOTE — Progress Notes (Signed)
Subjective:  Patient ID: Bobby Murillo, male    DOB: January 26, 1941  Age: 75 y.o. [name]: [id number]  CC: The primary encounter diagnosis was Hyperlipidemia LDL goal <100. Diagnoses of Bradycardia, Essential hypertension, and Bradycardia, sinus were also pertinent to this visit.  HPI Bobby Murillo presents for 6 month follow up on hypertension.  His hypertension has been managed with lisinopril, and he has had no side effects.   Sinus bradycardia: He has been more tired than usual for the last several weeks,  And fell asleep sitting up in his office chair yesterday before dinner which is unusual for him.  He denies syncope and dizziness. He has had no prior cardiology evaluation.  He is not a long distance runner.    Outpatient Prescriptions Prior to Visit  Medication Sig Dispense Refill  . acetaminophen (TYLENOL) 325 MG tablet Take 650 mg by mouth every 6 (six) hours as needed.      . ALPRAZolam (XANAX) 0.5 MG tablet Take 1 tablet (0.5 mg total) by mouth at bedtime as needed. for sleep 30 tablet 5  . aspirin 81 MG tablet Take 1 tablet by mouth daily. 1 tab by mouth daily    . Glucosamine-Chondroitin (GLUCOSAMINE CHONDR COMPLEX PO) Take 2 tablets by mouth daily. tab by mouth daily    . naproxen (NAPROSYN) 250 MG tablet Take 250 mg by mouth as needed.      Marland Kitchen OVER THE COUNTER MEDICATION 1 capsule daily. Stool softener    . lisinopril (PRINIVIL,ZESTRIL) 10 MG tablet TAKE ONE TABLET BY MOUTH ONCE DAILY 90 tablet 1  . cholecalciferol (VITAMIN D) 1000 UNITS tablet Take 1,000 Units by mouth daily. Reported on 05/29/2015    . polycarbophil (FIBERCON) 625 MG tablet Take 625 mg by mouth daily. Reported on 05/29/2015    . zoster vaccine live, PF, (ZOSTAVAX) 29562 UNT/0.65ML injection Inject 19,400 Units into the skin once. (Patient not taking: Reported on 11/30/2014) 1 each 0   No facility-administered medications prior to visit.    Review of Systems;  Patient denies headache, fevers, malaise,  unintentional weight loss, skin rash, eye pain, sinus congestion and sinus pain, sore throat, dysphagia,  hemoptysis , cough, dyspnea, wheezing, chest pain, palpitations, orthopnea, edema, abdominal pain, nausea, melena, diarrhea, constipation, flank pain, dysuria, hematuria, urinary  Frequency, nocturia, numbness, tingling, seizures,  Focal weakness, Loss of consciousness,  Tremor, insomnia, depression, anxiety, and suicidal ideation.      Objective:  BP 118/70 mmHg  Pulse 42  Temp(Src) 97.4 F (36.3 C) (Oral)  Resp 12  Ht 6\' 1"  (1.854 m)  Wt 203 lb 4 oz (92.194 kg)  BMI 26.82 kg/m2  SpO2 97%  BP Readings from Last 3 Encounters:  05/29/15 118/70  11/30/14 128/72  11/15/13 138/68    Wt Readings from Last 3 Encounters:  05/29/15 203 lb 4 oz (92.194 kg)  11/30/14 200 lb 8 oz (90.946 kg)  11/15/13 198 lb 4 oz (89.926 kg)    General appearance: alert, cooperative and appears stated age Ears: normal TM's and external ear canals both ears Throat: lips, mucosa, and tongue normal; teeth and gums normal Neck: no adenopathy, no carotid bruit, supple, symmetrical, trachea midline and thyroid not enlarged, symmetric, no tenderness/mass/nodules Back: symmetric, no curvature. ROM normal. No CVA tenderness. Lungs: clear to auscultation bilaterally Heart: bradycardic , S1, S2 normal, no murmur, click, rub or gallop Abdomen: soft, non-tender; bowel sounds normal; no masses,  no organomegaly Pulses: 2+ and symmetric Skin: Skin color, texture, turgor  normal. No rashes or lesions Lymph nodes: Cervical, supraclavicular, and axillary nodes normal.  No results found for: HGBA1C  Lab Results  Component Value Date   CREATININE 1.32 05/29/2015   CREATININE 1.26 11/28/2014   CREATININE 1.3 11/15/2013    Lab Results  Component Value Date   WBC 5.5 11/28/2014   HGB 16.9 11/28/2014   HCT 49.8 11/28/2014   PLT 256.0 11/28/2014   GLUCOSE 91 05/29/2015   CHOL 271* 05/29/2015   TRIG 96.0  05/29/2015   HDL 57.10 05/29/2015   LDLCALC 194* 05/29/2015   ALT 27 05/29/2015   AST 25 05/29/2015   NA 138 05/29/2015   K 4.8 05/29/2015   CL 102 05/29/2015   CREATININE 1.32 05/29/2015   BUN 22 05/29/2015   CO2 28 05/29/2015   TSH 2.47 05/29/2015   PSA 0.00* 11/28/2014    No results found.  Assessment & Plan:   Problem List Items Addressed This Visit    Essential hypertension    Given his bradycardia, will reduce his  Dose of lisinopril to improve his cardiac output.       Relevant Medications   lisinopril (PRINIVIL,ZESTRIL) 5 MG tablet   Bradycardia, sinus    Confirmed with EKG reviewed today.  With mild symptoms,  No syncope or dizziness.  No prior cardiology evaluation .  Will refer to [facility] cardiology for evaluation      Relevant Medications   lisinopril (PRINIVIL,ZESTRIL) 5 MG tablet   Hyperlipidemia LDL goal <100 - Primary   Relevant Medications   lisinopril (PRINIVIL,ZESTRIL) 5 MG tablet   Other Relevant Orders   Lipid panel (Completed)    Other Visit Diagnoses    Bradycardia        Relevant Orders    EKG 12-Lead (Completed)    Comprehensive metabolic panel (Completed)    Magnesium (Completed)    TSH (Completed)    Ambulatory referral to Cardiology       I have changed Mr. Nuckles's lisinopril. I am also having him maintain his acetaminophen, naproxen, cholecalciferol, polycarbophil, Glucosamine-Chondroitin (GLUCOSAMINE CHONDR COMPLEX PO), aspirin, OVER THE COUNTER MEDICATION, zoster vaccine live (PF), and ALPRAZolam.  Meds ordered this encounter  Medications  . lisinopril (PRINIVIL,ZESTRIL) 5 MG tablet    Sig: Take 1 tablet (5 mg total) by mouth daily.    Dispense:  90 tablet    Refill:  0    Medications Discontinued During This Encounter  Medication Reason  . lisinopril (PRINIVIL,ZESTRIL) 10 MG tablet Reorder    Follow-up: Return in about 6 months (around 11/29/2015) for wellness exam .   Crecencio Mc, MD

## 2015-06-01 ENCOUNTER — Encounter: Payer: Self-pay | Admitting: Internal Medicine

## 2015-06-01 DIAGNOSIS — R001 Bradycardia, unspecified: Secondary | ICD-10-CM | POA: Insufficient documentation

## 2015-06-01 DIAGNOSIS — I10 Essential (primary) hypertension: Secondary | ICD-10-CM | POA: Insufficient documentation

## 2015-06-01 NOTE — Assessment & Plan Note (Signed)
May be due to bradycardia. Screening labs normal.  Cardiology evaluation referred.

## 2015-06-01 NOTE — Assessment & Plan Note (Addendum)
Confirmed with EKG reviewed today.  With mild symptoms,  No syncope or dizziness.  No prior cardiology evaluation .  Will refer to [facility] cardiology for evaluation

## 2015-06-01 NOTE — Assessment & Plan Note (Signed)
Given his bradycardia, will reduce his  Dose of lisinopril to improve his cardiac output.

## 2015-06-12 ENCOUNTER — Ambulatory Visit (INDEPENDENT_AMBULATORY_CARE_PROVIDER_SITE_OTHER): Payer: Medicare Other | Admitting: Cardiology

## 2015-06-12 ENCOUNTER — Encounter: Payer: Self-pay | Admitting: Cardiology

## 2015-06-12 VITALS — BP 130/70 | HR 67 | Ht 73.0 in | Wt 205.5 lb

## 2015-06-12 DIAGNOSIS — R001 Bradycardia, unspecified: Secondary | ICD-10-CM | POA: Diagnosis not present

## 2015-06-12 DIAGNOSIS — E785 Hyperlipidemia, unspecified: Secondary | ICD-10-CM | POA: Diagnosis not present

## 2015-06-12 NOTE — Patient Instructions (Signed)
Medication Instructions:  Your physician recommends that you continue on your current medications as directed. Please refer to the Current Medication list given to you today.   Labwork: none  Testing/Procedures: Your physician has recommended that you wear a 48 hour holter monitor. Holter monitors are medical devices that record the heart's electrical activity. Doctors most often use these monitors to diagnose arrhythmias. Arrhythmias are problems with the speed or rhythm of the heartbeat. The monitor is a small, portable device. You can wear one while you do your normal daily activities. This is usually used to diagnose what is causing palpitations/syncope (passing out).    Follow-Up: Your physician recommends that you schedule a follow-up appointment in: two months with Dr. Ellyn Hack.    Any Other Special Instructions Will Be Listed Below (If Applicable).     If you need a refill on your cardiac medications before your next appointment, please call your pharmacy.  Holter Monitoring A Holter monitor is a small device that is used to detect abnormal heart rhythms. It clips to your clothing and is connected by wires to flat, sticky disks (electrodes) that attach to your chest. It is worn continuously for 24-48 hours. HOME CARE INSTRUCTIONS  Wear your Holter monitor at all times, even while exercising and sleeping, for as long as directed by your health care provider.  Make sure that the Holter monitor is safely clipped to your clothing or close to your body as recommended by your health care provider.  Do not get the monitor or wires wet.  Do not put body lotion or moisturizer on your chest.  Keep your skin clean.  Keep a diary of your daily activities, such as walking and doing chores. If you feel that your heartbeat is abnormal or that your heart is fluttering or skipping a beat:  Record what you are doing when it happens.  Record what time of day the symptoms occur.  Return  your Holter monitor as directed by your health care provider.  Keep all follow-up visits as directed by your health care provider. This is important. SEEK IMMEDIATE MEDICAL CARE IF:  You feel lightheaded or you faint.  You have trouble breathing.  You feel pain in your chest, upper arm, or jaw.  You feel sick to your stomach and your skin is pale, cool, or damp.  You heartbeat feels unusual or abnormal.   This information is not intended to replace advice given to you by your health care provider. Make sure you discuss any questions you have with your health care provider.   Document Released: 11/29/2003 Document Revised: 03/23/2014 Document Reviewed: 10/09/2013 Elsevier Interactive Patient Education [employer].

## 2015-06-12 NOTE — Progress Notes (Signed)
PATIENT: Bobby Murillo [name]: [id number] DOB: 07/22/1940 PCP: Crecencio Mc, MD  Clinic Note: Chief Complaint  Patient presents with  . Advice Only    Bradycardia. Meds reviewed verbally with pt.    HPI: Bobby Murillo is a 75 y.o. male with a PMH below who presents today for Bradycardia - rate 46 on EKG with PVC.  Interval History: Bobby Murillo is a very pleasant 75 year old gentleman with a history of hyperlipidemia, but otherwisein any cardiac history, who has had a long-standing question about his low heart rate. He is now referred for evaluation of bradycardia with rates in the 30s to 40s. He is actually relatively asymptomatic with this, has not ever had any syncope or near-syncope. Is relatively healthy and plays tennis routinely as well as golf. He also walks his dog a couple times a day. Denies any significant exercise intolerance, although he does state that he feels a little more tired this year than he did last year. He denies any headedness or dizziness, syncope or near syncope.  The remainder of cardiac review of systems is as follows: Cardiovascular ROS: positive for - "Slowing down just a bit " negative for - chest pain, dyspnea on exertion, edema, irregular heartbeat, loss of consciousness, murmur, orthopnea, palpitations, paroxysmal nocturnal dyspnea, rapid heart rate, shortness of breath or Near syncope, TIA/amaurosis fugax, claudication :   Past Medical History  Diagnosis Date  . Anxiety   . Hyperlipidemia   . Abnormal LFTs   . prostate cancer 2010    Dr. Ronny Bacon  . Basal cell carcinoma July 2012    left calf   . Sinus bradycardia by electrocardiogram     Prior Cardiac Evaluation and Past Surgical History: Past Surgical History  Procedure Laterality Date  . Prostatectomy  11/2008    transurethral, radical, Dr. Darcus Austin    Allergies  Allergen Reactions  . Alfuzosin Other (See Comments)    Other Reaction: fainting spell  . Pravastatin Sodium     Other reaction(s):  Muscle Pain    Current Outpatient Prescriptions  Medication Sig Dispense Refill  . acetaminophen (TYLENOL) 325 MG tablet Take 650 mg by mouth every 6 (six) hours as needed.      . ALPRAZolam (XANAX) 0.5 MG tablet Take 1 tablet (0.5 mg total) by mouth at bedtime as needed. for sleep 30 tablet 5  . aspirin 81 MG tablet Take 1 tablet by mouth daily. 1 tab by mouth daily    . Glucosamine-Chondroitin (GLUCOSAMINE CHONDR COMPLEX PO) Take 2 tablets by mouth daily. tab by mouth daily    . lisinopril (PRINIVIL,ZESTRIL) 5 MG tablet Take 1 tablet (5 mg total) by mouth daily. 90 tablet 0  . naproxen (NAPROSYN) 250 MG tablet Take 250 mg by mouth as needed.      . polycarbophil (FIBERCON) 625 MG tablet Take 625 mg by mouth daily. Reported on 05/29/2015    . zoster vaccine live, PF, (ZOSTAVAX) 96295 UNT/0.65ML injection Inject 19,400 Units into the skin once. 1 each 0   No current facility-administered medications for this visit.   Social History  Substance Use Topics  . Smoking status: Never Smoker   . Smokeless tobacco: Never Used  . Alcohol Use: 8.4 oz/week    7 Glasses of wine, 7 Shots of liquor per week   Family History family history includes Alzheimer's disease in his father, mother, and sister; Heart disease (age of onset: 72) in his paternal uncle; Stroke in his father and paternal aunt.  ROS: A comprehensive Review of Systems - Negative except Mild symptoms noted below General ROS: positive for  - Overall decrease excess tolerance from last year Musculoskeletal ROS: positive for - joint stiffness and Typical arthritis pains -  they do not limit him from his exercise.  PHYSICAL EXAM BP 130/70 mmHg  Pulse 67  Ht 6\' 1"  (1.854 m)  Wt 205 lb 8 oz (93.214 kg)  BMI 27.12 kg/m2 General appearance: alert, cooperative, appears stated age, no distress and Well-Nourished, well-groomed Neck: no adenopathy, no carotid bruit, no JVD, supple, symmetrical, trachea midline and thyroid not enlarged,  symmetric, no tenderness/mass/nodules Lungs: clear to auscultation bilaterally, normal percussion bilaterally and Nonlabored, good air movement Heart: Bradycardic with ectopy. Normal S1 and S2 with no obvious M/R/G. Abdomen: soft, non-tender; bowel sounds normal; no masses,  no organomegaly Extremities: extremities normal, atraumatic, no cyanosis or edema Pulses: 2+ and symmetric Skin: Skin color, texture, turgor normal. No rashes or lesions Neurologic: Alert and oriented X 3, normal strength and tone. Normal symmetric reflexes. Normal coordination and gait Cranial nerves: normal   Adult ECG Report  Rate: 67 ;  Rhythm: sinus bradycardia and With PACs in bigeminy.  QRS Axis: 15 (normal) ;  PR Interval: 164 ;  QRS Duration: 88 ; QTc: 431 -all normal;  Voltages: Normal  Conduction Disturbances: none and PACs with bigeminy  Other Abnormalities: Otherwise normal EKG   Narrative Interpretation: Abnormal EKG with bradycardia and PACs and bigeminy  Recent Labs: None available  ASSESSMENT / PLAN: Very pleasant, otherwise healthy gentleman who has a low resting heart rate, asymptomatic. No other cardiac symptoms.  Problem List Items Addressed This Visit    Hyperlipidemia LDL goal <100 (Chronic)    Controlled on lisinopril. Avoid AV nodal agents.      Bradycardia, sinus - Primary (Chronic)    Relatively asymptomatic. I'm not exactly sure what his heart rate range is, I would also like to know his chronotropic responsiveness. He doesn't describe any symptoms of chronotropic incompetence, but says it he probably can get his heart rate up "too high ".  He is not on any medications that I can tell would cause AV nodal blockade. Thankfully his EKG here today has a relatively normal rate although with PACs and bigeminy pattern.  I would like to have him wear a monitor for 48 hours in order to be see what his baseline rates are both at rest and with exertion. I told him to walk on the treadmill and do  whatever other exercise he would do in order for me to be a see what his heart rate is able to do.  Pending this evaluation, I think that he probably simply just has a low resting heart rate is being monitored but does not require any additional therapy. Major advice would be to avoid any AV nodal agents including beta blockers (even beta blockers and eyedrops) as well as non-dihydropyridine calcium channel blockers such as verapamil and diltiazem.      Relevant Orders   EKG 12-Lead (Completed)   Holter monitor - 48 hour      No orders of the defined types were placed in this encounter.    Followup: 1-2 months    HARDING,DAVID W, M.D., M.S.  [employer] [facility]  [address] [place], [place] [address] [phone] Fax [phone]

## 2015-06-14 ENCOUNTER — Encounter: Payer: Self-pay | Admitting: Cardiology

## 2015-06-14 NOTE — Assessment & Plan Note (Signed)
Relatively asymptomatic. I'm not exactly sure what his heart rate range is, I would also like to know his chronotropic responsiveness. He doesn't describe any symptoms of chronotropic incompetence, but says it he probably can get his heart rate up "too high ".  He is not on any medications that I can tell would cause AV nodal blockade. Thankfully his EKG here today has a relatively normal rate although with PACs and bigeminy pattern.  I would like to have him wear a monitor for 48 hours in order to be see what his baseline rates are both at rest and with exertion. I told him to walk on the treadmill and do whatever other exercise he would do in order for me to be a see what his heart rate is able to do.  Pending this evaluation, I think that he probably simply just has a low resting heart rate is being monitored but does not require any additional therapy. Major advice would be to avoid any AV nodal agents including beta blockers (even beta blockers and eyedrops) as well as non-dihydropyridine calcium channel blockers such as verapamil and diltiazem.

## 2015-06-14 NOTE — Assessment & Plan Note (Signed)
Controlled on lisinopril. Avoid AV nodal agents.

## 2015-06-17 ENCOUNTER — Ambulatory Visit (INDEPENDENT_AMBULATORY_CARE_PROVIDER_SITE_OTHER): Payer: Medicare Other

## 2015-06-17 DIAGNOSIS — R001 Bradycardia, unspecified: Secondary | ICD-10-CM

## 2015-06-19 ENCOUNTER — Ambulatory Visit: Payer: Medicare Other | Admitting: Cardiology

## 2015-07-02 ENCOUNTER — Telehealth: Payer: Self-pay | Admitting: Cardiology

## 2015-07-02 NOTE — Telephone Encounter (Signed)
Relatively reassuring event monitor. No prolonged bradycardia & no Sx.         Would not recommend referral to EP.        [facility]    Left detailed message w/CB number on pt home VM

## 2015-07-07 ENCOUNTER — Other Ambulatory Visit: Payer: Self-pay | Admitting: Internal Medicine

## 2015-07-08 NOTE — Telephone Encounter (Signed)
Xanax refill request.  Last filled 09/16 with 5 refills.  Last seen 05/29/15.  Please advise.

## 2015-07-08 NOTE — Telephone Encounter (Signed)
refilled 

## 2015-08-14 ENCOUNTER — Encounter: Payer: Self-pay | Admitting: Cardiology

## 2015-08-14 ENCOUNTER — Ambulatory Visit (INDEPENDENT_AMBULATORY_CARE_PROVIDER_SITE_OTHER): Payer: Medicare Other | Admitting: Cardiology

## 2015-08-14 VITALS — BP 128/74 | HR 57 | Ht 73.0 in | Wt 205.5 lb

## 2015-08-14 DIAGNOSIS — R001 Bradycardia, unspecified: Secondary | ICD-10-CM

## 2015-08-14 NOTE — Patient Instructions (Signed)
Medication Instructions:  Your physician recommends that you continue on your current medications as directed. Please refer to the Current Medication list given to you today.   Labwork: none  Testing/Procedures: none  Follow-Up: Your physician recommends that you schedule a follow-up appointment as needed with Dr. Harding   Any Other Special Instructions Will Be Listed Below (If Applicable).     If you need a refill on your cardiac medications before your next appointment, please call your pharmacy.   

## 2015-08-14 NOTE — Progress Notes (Signed)
PATIENT: GEVORK ESPINOSA [name]: [id number] DOB: 15-Sep-1940 PCP: Crecencio Mc, MD  Clinic Note: Chief Complaint  Patient presents with  . other    2 month follow up as well as discuss holter monitor results. Meds reviewed by the patient verbally.   . Bradycardia    A symptomatically    HPI: RAMEL RODELA is a 75 y.o. male with a PMH below who presents today for two-month follow-up to discuss Bradycardia - rates from high 30s.  Mostly Asymptomatic.   Mr. lessman was last seen on 06/12/2015 for initial consultation. He now follows up for routine follow-up  No hospitalizations.  Studies Reviewed  48-hour Holter monitor 06/17/2015  Sinus rhythm sinus bradycardia as low as 37, sinus tachycardia as high as 112 BPM.  Rare PVCs with occasional bigeminy. Some couplets and quadigeminy  More frequent PACs with occasional bigeminy, couplets  Very rare triplets of PVCs and PACs  No significant arrhythmias noted. Symptomatic bigeminy is possible.   Interval History: Mr. Uselton is a very pleasant 75 year old gentleman with a history of hyperlipidemia, & a long-standing history of  low heart rate. He continues to be asymptomatic, is able to do the exercises he likely without any significant difficulty. He denies any symptoms consistent with chronotropic incompetence such as fatigue or exertional dyspnea. Was noted that he is "slowed down a bit since his younger years ", but is not excessively symptomatic. In fact today he wore his monitor, he played golf one day and was at the gym working on Monday. He was able to get his heart rate up into the 110s. He says when he is involved with a more vigorous activity, he can get his heart rate up further.  The remainder of cardiac review of systems is as follows:   Cardiovascular ROS: positive for - "Slowing down just a bit " negative for - chest pain, dyspnea on exertion, edema, irregular heartbeat, loss of consciousness, murmur, orthopnea, palpitations,  paroxysmal nocturnal dyspnea, rapid heart rate, shortness of breath or Near syncope, TIA/amaurosis fugax, claudication :   Past Medical History  Diagnosis Date  . Anxiety   . Hyperlipidemia   . Abnormal LFTs   . prostate cancer 2010    Dr. Ronny Bacon  . Basal cell carcinoma July 2012    left calf   . Sinus bradycardia by electrocardiogram     Prior Cardiac Evaluation and Past Surgical History: Past Surgical History  Procedure Laterality Date  . Prostatectomy  11/2008    transurethral, radical, Dr. Darcus Austin    Allergies  Allergen Reactions  . Alfuzosin Other (See Comments)    Other Reaction: fainting spell  . Pravastatin Sodium     Other reaction(s): Muscle Pain    Current Outpatient Prescriptions  Medication Sig Dispense Refill  . acetaminophen (TYLENOL) 325 MG tablet Take 650 mg by mouth every 6 (six) hours as needed.      . ALPRAZolam (XANAX) 0.5 MG tablet TAKE ONE TABLET BY MOUTH AT BEDTIME AS NEEDED FOR SLEEP 30 tablet 3  . aspirin 81 MG tablet Take 1 tablet by mouth daily. 1 tab by mouth daily    . Glucosamine-Chondroitin (GLUCOSAMINE CHONDR COMPLEX PO) Take 2 tablets by mouth daily. tab by mouth daily    . lisinopril (PRINIVIL,ZESTRIL) 5 MG tablet Take 1 tablet (5 mg total) by mouth daily. 90 tablet 0  . naproxen (NAPROSYN) 250 MG tablet Take 250 mg by mouth as needed.      . polycarbophil (FIBERCON) 625  MG tablet Take 625 mg by mouth daily. Reported on 05/29/2015    . zoster vaccine live, PF, (ZOSTAVAX) 51884 UNT/0.65ML injection Inject 19,400 Units into the skin once. 1 each 0   No current facility-administered medications for this visit.   Social History  Substance Use Topics  . Smoking status: Never Smoker   . Smokeless tobacco: Never Used  . Alcohol Use: 8.4 oz/week    7 Glasses of wine, 7 Shots of liquor per week   Family History family history includes Alzheimer's disease in his father, mother, and sister; Heart disease (age of onset: 66) in his paternal uncle;  Stroke in his father and paternal aunt.   PHYSICAL EXAM BP 128/74 mmHg  Pulse 57  Ht 6\' 1"  (1.854 m)  Wt 205 lb 8 oz (93.214 kg)  BMI 27.12 kg/m2 General appearance: alert, cooperative, appears stated age, no distress and Well-Nourished, well-groomed Neck: no adenopathy, no carotid bruit, no JVD, supple, symmetrical, trachea midline and thyroid not enlarged, symmetric, no tenderness/mass/nodules Lungs: clear to auscultation bilaterally, normal percussion bilaterally and Nonlabored, good air movement Heart: Bradycardic with ectopy. Normal S1 and S2 with no obvious M/R/G. Abdomen: soft, non-tender; bowel sounds normal; no masses,  no organomegaly Extremities: extremities normal, atraumatic, no cyanosis or edema Pulses: 2+ and symmetric Skin: Skin color, texture, turgor normal. No rashes or lesions Neurologic: Alert and oriented X 3, normal strength and tone. Normal symmetric reflexes. Normal coordination and gait Cranial nerves: normal   Adult ECG Report  Not checked   Narrative Interpretation: Abnormal EKG with bradycardia and PACs and bigeminy  Recent Labs: None available  ASSESSMENT / PLAN: Very pleasant, otherwise healthy gentleman who has a low resting heart rate, asymptomatic. No other cardiac symptoms.  Problem List Items Addressed This Visit    Bradycardia, sinus - Primary (Chronic)    He continues to be relatively astigmatic. He does have some considerably lower heart rates, he did have some quite low heart rates. These were more consistent with when he was sleeping making them less concerning. He was able to his heart rate above 100-110 excluding chronotropic incompetence.  Would simply monitor and avoid AV nodal agents including eyedrops. No indication at this point for permanent pacemaker.  Follow-up as needed.         No orders of the defined types were placed in this encounter.    Followup: PRN    Leonie Man, M.D., M.S.  [employer] [facility]  [address] [place], [place] [address] [phone] Fax [phone]

## 2015-08-16 ENCOUNTER — Encounter: Payer: Self-pay | Admitting: Cardiology

## 2015-08-16 NOTE — Assessment & Plan Note (Signed)
He continues to be relatively astigmatic. He does have some considerably lower heart rates, he did have some quite low heart rates. These were more consistent with when he was sleeping making them less concerning. He was able to his heart rate above 100-110 excluding chronotropic incompetence.  Would simply monitor and avoid AV nodal agents including eyedrops. No indication at this point for permanent pacemaker.  Follow-up as needed.

## 2015-08-29 ENCOUNTER — Other Ambulatory Visit: Payer: Self-pay | Admitting: Internal Medicine

## 2015-11-25 ENCOUNTER — Encounter: Payer: Self-pay | Admitting: Internal Medicine

## 2015-11-25 ENCOUNTER — Ambulatory Visit (INDEPENDENT_AMBULATORY_CARE_PROVIDER_SITE_OTHER): Payer: Medicare Other | Admitting: Internal Medicine

## 2015-11-25 DIAGNOSIS — R001 Bradycardia, unspecified: Secondary | ICD-10-CM

## 2015-11-25 DIAGNOSIS — E785 Hyperlipidemia, unspecified: Secondary | ICD-10-CM

## 2015-11-25 DIAGNOSIS — Z79899 Other long term (current) drug therapy: Secondary | ICD-10-CM | POA: Diagnosis not present

## 2015-11-25 DIAGNOSIS — Z0000 Encounter for general adult medical examination without abnormal findings: Secondary | ICD-10-CM | POA: Diagnosis not present

## 2015-11-25 DIAGNOSIS — I10 Essential (primary) hypertension: Secondary | ICD-10-CM | POA: Diagnosis not present

## 2015-11-25 DIAGNOSIS — Z8546 Personal history of malignant neoplasm of prostate: Secondary | ICD-10-CM

## 2015-11-25 NOTE — Progress Notes (Signed)
Patient ID: Bobby Murillo, male    DOB: [birth year]  Age: 75 y.o. [name]: [id number]  The patient is here for annual physical examination and management of other chronic and acute problems.  Colonoscopy normal 2012. Iftikhar.  PSA due on or after sept 14 History of statin intolerance to pravastin but no other trials  Does  Have living wills.  Has a [employer] .   The risk factors are reflected in the social history.  The roster of all physicians providing medical care to patient - is listed in the Snapshot section of the chart.  Activities of daily living:  The patient is 100% independent in all ADLs: dressing, toileting, feeding as well as independent mobility  Home safety : The patient has smoke detectors in the home. They wear seatbelts.  There are no firearms at home. There is no violence in the home.   There is no risks for hepatitis, STDs or HIV. There is no   history of blood transfusion. They have no travel history to infectious disease endemic areas of the world.  The patient has seen their dentist in the last six month. They have seen their eye doctor in the last year. They admit to slight hearing difficulty with regard to whispered voices and some television programs.  They have deferred audiologic testing in the last year.  They do not  have excessive sun exposure. Discussed the need for sun protection: hats, long sleeves and use of sunscreen if there is significant sun exposure.   Diet: the importance of a healthy diet is discussed. They do have a healthy diet.  The benefits of regular aerobic exercise were discussed. He plays tennis and gold 4 days per week .    Depression screen: there are no signs or vegative symptoms of depression- irritability, change in appetite, anhedonia, sadness/tearfullness.  Cognitive assessment: the patient manages all their financial and personal affairs and is actively engaged. They could relate day,date,year and events; recalled 2/3  objects at 3 minutes; performed clock-face test normally.  The following portions of the patient's history were reviewed and updated as appropriate: allergies, current medications, past family history, past medical history,  past surgical history, past social history  and problem list.  Visual acuity was not assessed per patient preference since she has regular follow up with her ophthalmologist. Hearing and body mass index were assessed and reviewed.   During the course of the visit the patient was educated and counseled about appropriate screening and preventive services including : fall prevention , diabetes screening, nutrition counseling, colorectal cancer screening, and recommended immunizations.    CC: Diagnoses of Bradycardia, sinus, Encounter for Medicare annual wellness exam, Hyperlipidemia LDL goal <100, Essential hypertension, and History of prostate cancer were pertinent to this visit.   Left heel has been feeling slightly numb. No history of DM,  Sciatica or CVA. Denies pain .  History Darrio has a past medical history of Abnormal LFTs; Anxiety; Basal cell carcinoma (July 2012); Hyperlipidemia; prostate cancer (2010); and Sinus bradycardia by electrocardiogram.   He has a past surgical history that includes ostatectomy (11/2008).   His family history includes Alzheimer's disease in his father, mother, and sister; Heart disease (age of onset: 39) in his paternal uncle; Stroke in his father and paternal aunt.He reports that he has never smoked. He has never used smokeless tobacco. He reports that he drinks about 8.4 oz of alcohol per week . He reports that he does not use drugs.  Outpatient Medications Prior to Visit  Medication Sig Dispense Refill  . acetaminophen (TYLENOL) 325 MG tablet Take 650 mg by mouth every 6 (six) hours as needed.      . ALPRAZolam (XANAX) 0.5 MG tablet TAKE ONE TABLET BY MOUTH AT BEDTIME AS NEEDED FOR SLEEP 30 tablet 3  . aspirin 81 MG tablet Take 1  tablet by mouth daily. 1 tab by mouth daily    . Glucosamine-Chondroitin (GLUCOSAMINE CHONDR COMPLEX PO) Take 2 tablets by mouth daily. tab by mouth daily    . lisinopril (PRINIVIL,ZESTRIL) 5 MG tablet TAKE ONE TABLET BY MOUTH ONCE DAILY 90 tablet 2  . naproxen (NAPROSYN) 250 MG tablet Take 250 mg by mouth as needed.      . polycarbophil (FIBERCON) 625 MG tablet Take 625 mg by mouth daily. Reported on 05/29/2015    . zoster vaccine live, PF, (ZOSTAVAX) 09811 UNT/0.65ML injection Inject 19,400 Units into the skin once. 1 each 0   No facility-administered medications prior to visit.     Review of Systems   Patient denies headache, fevers, malaise, unintentional weight loss, skin rash, eye pain, sinus congestion and sinus pain, sore throat, dysphagia,  hemoptysis , cough, dyspnea, wheezing, chest pain, palpitations, orthopnea, edema, abdominal pain, nausea, melena, diarrhea, constipation, flank pain, dysuria, hematuria, urinary  Frequency, nocturia, numbness, tingling, seizures,  Focal weakness, Loss of consciousness,  Tremor, insomnia, depression, anxiety, and suicidal ideation.      Objective:  BP 120/82 (BP Location: Right Arm, Patient Position: Sitting, Cuff Size: Normal)   Pulse (!) 45   Temp 97.3 F (36.3 C)   Resp 16   Ht 6\' 1"  (1.854 m)   Wt 205 lb 1.9 oz (93 kg)   SpO2 96%   BMI 27.06 kg/m   Physical Exam   General appearance: alert, cooperative and appears stated age Ears: normal TM's and external ear canals both ears Throat: lips, mucosa, and tongue normal; teeth and gums normal Neck: no adenopathy, no carotid bruit, supple, symmetrical, trachea midline and thyroid not enlarged, symmetric, no tenderness/mass/nodules Back: symmetric, no curvature. ROM normal. No CVA tenderness. Lungs: clear to auscultation bilaterally Heart: regular rate and rhythm, S1, S2 normal, no murmur, click, rub or gallop Abdomen: soft, non-tender; bowel sounds normal; no masses,  no  organomegaly Pulses: 2+ and symmetric Skin: Skin color, texture, turgor normal. No rashes or lesions Lymph nodes: Cervical, supraclavicular, and axillary nodes normal. Neuro: CNs 2-12 intact. DTRs 2+/4 in biceps, brachioradialis, patellars and achilles. Muscle strength 5/5 in upper and lower exremities. No tremor bilaterally  cerebellar function normal. Romberg negative.  No pronator drift.   Gait normal. Sensation intact to microfilament in both heels/soles of feet.      Assessment & Plan:   Problem List Items Addressed This Visit    Hyperlipidemia LDL goal <100 (Chronic)    Will repeat assessment and consider alternate statin therapy       Bradycardia, sinus (Chronic)    S/p cardiology referral,  With good results.  Hr increases with exercise .  No further workup at this time.       History of prostate cancer    PSA was undetectable for years, followed by a rise, which was treated with XRT .  Annual PSA is due       Encounter for Medicare annual wellness exam    Annual Medicare wellness  exam was done as well as a comprehensive physical exam and management of acute and chronic conditions .  During the course of the visit the patient was educated and counseled about appropriate screening and preventive services including : fall prevention , diabetes screening, nutrition counseling, colorectal cancer screening, and recommended immunizations.  Printed recommendations for health maintenance screenings was given.       Essential hypertension    Well controlled on current regimen. Renal function due, no changes today. Lab Results  Component Value Date   CREATININE 1.32 05/29/2015  .       Other Visit Diagnoses   None.     I am having Mr. Sawaya maintain his acetaminophen, naproxen, polycarbophil, Glucosamine-Chondroitin (GLUCOSAMINE CHONDR COMPLEX PO), aspirin, zoster vaccine live (PF), ALPRAZolam, and lisinopril.  No orders of the defined types were placed in this  encounter.   There are no discontinued medications.  Follow-up: Return in about 4 weeks (around 12/23/2015) for fasting labs and PSA.   Crecencio Mc, MD

## 2015-11-25 NOTE — Patient Instructions (Signed)
Please make a fasting labs appointment in 3-4 weeks , and bring me copies of your living wills and healthcare POAs's so I can copy them into your chart    \Make sure you and Bobby Murillo take a baby aspirin daily during your trip  Don't forget to get your flu vaccine   Health Maintenance, Male A healthy lifestyle and preventative care can promote health and wellness.  Maintain regular health, dental, and eye exams.  Eat a healthy diet. Foods like vegetables, fruits, whole grains, low-fat dairy products, and lean protein foods contain the nutrients you need and are low in calories. Decrease your intake of foods high in solid fats, added sugars, and salt. Get information about a proper diet from your health care provider, if necessary.  Regular physical exercise is one of the most important things you can do for your health. Most adults should get at least 150 minutes of moderate-intensity exercise (any activity that increases your heart rate and causes you to sweat) each week. In addition, most adults need muscle-strengthening exercises on 2 or more days a week.   Maintain a healthy weight. The body mass index (BMI) is a screening tool to identify possible weight problems. It provides an estimate of body fat based on height and weight. Your health care provider can find your BMI and can help you achieve or maintain a healthy weight. For males 20 years and older:  A BMI below 18.5 is considered underweight.  A BMI of 18.5 to 24.9 is normal.  A BMI of 25 to 29.9 is considered overweight.  A BMI of 30 and above is considered obese.  Maintain normal blood lipids and cholesterol by exercising and minimizing your intake of saturated fat. Eat a balanced diet with plenty of fruits and vegetables. Blood tests for lipids and cholesterol should begin at age 47 and be repeated every 5 years. If your lipid or cholesterol levels are high, you are over age 39, or you are at high risk for heart disease, you may  need your cholesterol levels checked more frequently.Ongoing high lipid and cholesterol levels should be treated with medicines if diet and exercise are not working.  If you smoke, find out from your health care provider how to quit. If you do not use tobacco, do not start.  Lung cancer screening is recommended for adults aged 32-80 years who are at high risk for developing lung cancer because of a history of smoking. A yearly low-dose CT scan of the lungs is recommended for people who have at least a 30-pack-year history of smoking and are current smokers or have quit within the past 15 years. A pack year of smoking is smoking an average of 1 pack of cigarettes a day for 1 year (for example, a 30-pack-year history of smoking could mean smoking 1 pack a day for 30 years or 2 packs a day for 15 years). Yearly screening should continue until the smoker has stopped smoking for at least 15 years. Yearly screening should be stopped for people who develop a health problem that would prevent them from having lung cancer treatment.  If you choose to drink alcohol, do not have more than 2 drinks per day. One drink is considered to be 12 oz (360 mL) of beer, 5 oz (150 mL) of wine, or 1.5 oz (45 mL) of liquor.  Avoid the use of street drugs. Do not share needles with anyone. Ask for help if you need support or instructions about stopping the  use of drugs.  High blood pressure causes heart disease and increases the risk of stroke. High blood pressure is more likely to develop in:  People who have blood pressure in the end of the normal range (100-139/85-89 mm Hg).  People who are overweight or obese.  People who are African American.  If you are 26-46 years of age, have your blood pressure checked every 3-5 years. If you are 54 years of age or older, have your blood pressure checked every year. You should have your blood pressure measured twice--once when you are at a hospital or clinic, and once when you are  not at a hospital or clinic. Record the average of the two measurements. To check your blood pressure when you are not at a hospital or clinic, you can use:  An automated blood pressure machine at a pharmacy.  A home blood pressure monitor.  If you are 63-93 years old, ask your health care provider if you should take aspirin to prevent heart disease.  Diabetes screening involves taking a blood sample to check your fasting blood sugar level. This should be done once every 3 years after age 3 if you are at a normal weight and without risk factors for diabetes. Testing should be considered at a younger age or be carried out more frequently if you are overweight and have at least 1 risk factor for diabetes.  Colorectal cancer can be detected and often prevented. Most routine colorectal cancer screening begins at the age of 77 and continues through age 34. However, your health care provider may recommend screening at an earlier age if you have risk factors for colon cancer. On a yearly basis, your health care provider may provide home test kits to check for hidden blood in the stool. A small camera at the end of a tube may be used to directly examine the colon (sigmoidoscopy or colonoscopy) to detect the earliest forms of colorectal cancer. Talk to your health care provider about this at age 5 when routine screening begins. A direct exam of the colon should be repeated every 5-10 years through age 21, unless early forms of precancerous polyps or small growths are found.  People who are at an increased risk for hepatitis B should be screened for this virus. You are considered at high risk for hepatitis B if:  You were born in a country where hepatitis B occurs often. Talk with your health care provider about which countries are considered high risk.  Your parents were born in a high-risk country and you have not received a shot to protect against hepatitis B (hepatitis B vaccine).  You have HIV or  AIDS.  You use needles to inject street drugs.  You live with, or have sex with, someone who has hepatitis B.  You are a man who has sex with other men (MSM).  You get hemodialysis treatment.  You take certain medicines for conditions like cancer, organ transplantation, and autoimmune conditions.  Hepatitis C blood testing is recommended for all people born from [birth year] through 1963 and any individual with known risk factors for hepatitis C.  Healthy men should no longer receive prostate-specific antigen (PSA) blood tests as part of routine cancer screening. Talk to your health care provider about prostate cancer screening.  Testicular cancer screening is not recommended for adolescents or adult males who have no symptoms. Screening includes self-exam, a health care provider exam, and other screening tests. Consult with your health care provider about any symptoms  you have or any concerns you have about testicular cancer.  Practice safe sex. Use condoms and avoid high-risk sexual practices to reduce the spread of sexually transmitted infections (STIs).  You should be screened for STIs, including gonorrhea and chlamydia if:  You are sexually active and are younger than 24 years.  You are older than 24 years, and your health care provider tells you that you are at risk for this type of infection.  Your sexual activity has changed since you were last screened, and you are at an increased risk for chlamydia or gonorrhea. Ask your health care provider if you are at risk.  If you are at risk of being infected with HIV, it is recommended that you take a prescription medicine daily to prevent HIV infection. This is called pre-exposure prophylaxis (PrEP). You are considered at risk if:  You are a man who has sex with other men (MSM).  You are a heterosexual man who is sexually active with multiple partners.  You take drugs by injection.  You are sexually active with a partner who has  HIV.  Talk with your health care provider about whether you are at high risk of being infected with HIV. If you choose to begin PrEP, you should first be tested for HIV. You should then be tested every 3 months for as long as you are taking PrEP.  Use sunscreen. Apply sunscreen liberally and repeatedly throughout the day. You should seek shade when your shadow is shorter than you. Protect yourself by wearing long sleeves, pants, a wide-brimmed hat, and sunglasses year round whenever you are outdoors.  Tell your health care provider of new moles or changes in moles, especially if there is a change in shape or color. Also, tell your health care provider if a mole is larger than the size of a pencil eraser.  A one-time screening for abdominal aortic aneurysm (AAA) and surgical repair of large AAAs by ultrasound is recommended for men aged 70-75 years who are current or former smokers.  Stay current with your vaccines (immunizations).   This information is not intended to replace advice given to you by your health care provider. Make sure you discuss any questions you have with your health care provider.   Document Released: 08/29/2007 Document Revised: 03/23/2014 Document Reviewed: 07/28/2010 Elsevier Interactive Patient Education [employer].

## 2015-11-26 NOTE — Assessment & Plan Note (Signed)

## 2015-11-26 NOTE — Assessment & Plan Note (Signed)
Will repeat assessment and consider alternate statin therapy

## 2015-11-26 NOTE — Assessment & Plan Note (Signed)
S/p cardiology referral,  With good results.  Hr increases with exercise .  No further workup at this time.

## 2015-11-26 NOTE — Assessment & Plan Note (Signed)
PSA was undetectable for years, followed by a rise, which was treated with XRT .  Annual PSA is due

## 2015-11-26 NOTE — Progress Notes (Signed)
Pre visit review using our clinic review tool, if applicable. No additional management support is needed unless otherwise documented below in the visit note. 

## 2015-11-26 NOTE — Assessment & Plan Note (Signed)
Well controlled on current regimen. Renal function due, no changes today. Lab Results  Component Value Date   CREATININE 1.32 05/29/2015  .

## 2015-12-27 ENCOUNTER — Telehealth: Payer: Self-pay | Admitting: *Deleted

## 2015-12-27 DIAGNOSIS — E559 Vitamin D deficiency, unspecified: Secondary | ICD-10-CM

## 2015-12-27 DIAGNOSIS — R5383 Other fatigue: Secondary | ICD-10-CM

## 2015-12-27 DIAGNOSIS — I10 Essential (primary) hypertension: Secondary | ICD-10-CM

## 2015-12-27 DIAGNOSIS — E785 Hyperlipidemia, unspecified: Secondary | ICD-10-CM

## 2015-12-27 DIAGNOSIS — Z8546 Personal history of malignant neoplasm of prostate: Secondary | ICD-10-CM

## 2015-12-27 NOTE — Telephone Encounter (Signed)
Pt has lab appt on Monday for fasting labs & PSA. Need labs ordered.

## 2015-12-30 ENCOUNTER — Ambulatory Visit (INDEPENDENT_AMBULATORY_CARE_PROVIDER_SITE_OTHER): Payer: Medicare Other

## 2015-12-30 ENCOUNTER — Other Ambulatory Visit (INDEPENDENT_AMBULATORY_CARE_PROVIDER_SITE_OTHER): Payer: Medicare Other

## 2015-12-30 DIAGNOSIS — E559 Vitamin D deficiency, unspecified: Secondary | ICD-10-CM

## 2015-12-30 DIAGNOSIS — Z8546 Personal history of malignant neoplasm of prostate: Secondary | ICD-10-CM

## 2015-12-30 DIAGNOSIS — R5383 Other fatigue: Secondary | ICD-10-CM

## 2015-12-30 DIAGNOSIS — Z23 Encounter for immunization: Secondary | ICD-10-CM

## 2015-12-30 DIAGNOSIS — E785 Hyperlipidemia, unspecified: Secondary | ICD-10-CM

## 2015-12-30 LAB — COMPREHENSIVE METABOLIC PANEL
ALBUMIN: 4.1 g/dL (ref 3.5–5.2)
ALT: 29 U/L (ref 0–53)
AST: 24 U/L (ref 0–37)
Alkaline Phosphatase: 69 U/L (ref 39–117)
BILIRUBIN TOTAL: 1 mg/dL (ref 0.2–1.2)
BUN: 25 mg/dL — ABNORMAL HIGH (ref 6–23)
CHLORIDE: 107 meq/L (ref 96–112)
CO2: 24 meq/L (ref 19–32)
Calcium: 9.4 mg/dL (ref 8.4–10.5)
Creatinine, Ser: 1.26 mg/dL (ref 0.40–1.50)
GFR: 59.54 mL/min — ABNORMAL LOW (ref >60.00)
GLUCOSE: 101 mg/dL — AB (ref 70–99)
POTASSIUM: 4.3 meq/L (ref 3.5–5.1)
SODIUM: 140 meq/L (ref 135–145)
TOTAL PROTEIN: 6.4 g/dL (ref 6.0–8.3)

## 2015-12-30 LAB — CBC WITH DIFFERENTIAL/PLATELET
BASOS PCT: 0.5 % (ref 0.0–3.0)
Basophils Absolute: 0 10*3/uL (ref 0.0–0.1)
EOS ABS: 0.2 10*3/uL (ref 0.0–0.7)
Eosinophils Relative: 3.8 % (ref 0.0–5.0)
HEMATOCRIT: 49.7 % (ref 39.0–52.0)
HEMOGLOBIN: 17.1 g/dL — AB (ref 13.0–17.0)
LYMPHS PCT: 18.9 % (ref 12.0–46.0)
Lymphs Abs: 0.9 10*3/uL (ref 0.7–4.0)
MCHC: 34.4 g/dL (ref 30.0–36.0)
MCV: 91 fl (ref 78.0–100.0)
MONO ABS: 0.6 10*3/uL (ref 0.1–1.0)
Monocytes Relative: 12.2 % — ABNORMAL HIGH (ref 3.0–12.0)
Neutro Abs: 3.2 10*3/uL (ref 1.4–7.7)
Neutrophils Relative %: 64.6 % (ref 43.0–77.0)
Platelets: 228 10*3/uL (ref 150.0–400.0)
RBC: 5.46 Mil/uL (ref 4.22–5.81)
RDW: 13.3 % (ref 11.5–15.5)
WBC: 4.9 10*3/uL (ref 4.0–10.5)

## 2015-12-30 LAB — LDL CHOLESTEROL, DIRECT: Direct LDL: 184 mg/dL

## 2015-12-30 LAB — LIPID PANEL
CHOL/HDL RATIO: 5
CHOLESTEROL: 272 mg/dL — AB (ref 0–200)
HDL: 58.7 mg/dL (ref >39.00)
LDL CALC: 190 mg/dL — AB (ref 0–99)
NonHDL: 213.7
TRIGLYCERIDES: 120 mg/dL (ref 0.0–149.0)
VLDL: 24 mg/dL (ref 0.0–40.0)

## 2015-12-30 LAB — VITAMIN D 25 HYDROXY (VIT D DEFICIENCY, FRACTURES): VITD: 35.84 ng/mL (ref 30.00–100.00)

## 2015-12-30 LAB — PSA, MEDICARE: PSA: 0 ng/mL — AB (ref 0.10–4.00)

## 2015-12-30 LAB — TSH: TSH: 2.27 u[IU]/mL (ref 0.35–4.50)

## 2016-01-01 ENCOUNTER — Encounter: Payer: Self-pay | Admitting: Internal Medicine

## 2016-01-01 ENCOUNTER — Telehealth: Payer: Self-pay | Admitting: Internal Medicine

## 2016-01-15 NOTE — Telephone Encounter (Signed)
Mailed unread message to patient.  

## 2016-02-03 DIAGNOSIS — L570 Actinic keratosis: Secondary | ICD-10-CM | POA: Diagnosis not present

## 2016-02-03 DIAGNOSIS — Z85828 Personal history of other malignant neoplasm of skin: Secondary | ICD-10-CM | POA: Diagnosis not present

## 2016-03-01 ENCOUNTER — Other Ambulatory Visit: Payer: Self-pay | Admitting: Internal Medicine

## 2016-03-03 NOTE — Telephone Encounter (Signed)
Rx xanax 0.5 mg sent to [facility]

## 2016-06-30 ENCOUNTER — Other Ambulatory Visit: Payer: Self-pay | Admitting: Internal Medicine

## 2016-09-05 ENCOUNTER — Other Ambulatory Visit: Payer: Self-pay | Admitting: Internal Medicine

## 2016-09-07 NOTE — Telephone Encounter (Signed)
Last refill was 03/01/16, last OV was 11/25/15. Please advise, thanks, no upcoming appt.

## 2016-09-09 NOTE — Telephone Encounter (Signed)
Refill for 30 days only.  OFFICE VISIT NEEDED prior to any more refills 

## 2016-10-31 ENCOUNTER — Other Ambulatory Visit: Payer: Self-pay | Admitting: Internal Medicine

## 2016-11-02 NOTE — Telephone Encounter (Signed)
Needs 6 month follow up on hypertension and refil on controlled substance Refill for 30 days only.  OFFICE VISIT NEEDED prior to any more refills

## 2016-11-02 NOTE — Telephone Encounter (Signed)
Printed, signed and faxed.  

## 2016-11-02 NOTE — Telephone Encounter (Signed)
Last OV 11/25/2015 Next OV Not scheduled  Last refill 09/09/2016

## 2016-12-09 NOTE — Telephone Encounter (Signed)
Error

## 2016-12-14 NOTE — Telephone Encounter (Signed)
Error

## 2017-01-12 ENCOUNTER — Other Ambulatory Visit: Payer: Self-pay | Admitting: Internal Medicine

## 2017-01-14 NOTE — Telephone Encounter (Signed)
Patient has not been in a year called and left message to call office needs appointment right?

## 2017-01-15 ENCOUNTER — Telehealth: Payer: Self-pay | Admitting: Internal Medicine

## 2017-01-15 NOTE — Telephone Encounter (Signed)
Copied from [place] [phone]. Topic: Inquiry >> Jan 15, 2017  3:56 PM Darl Householder, RMA wrote: Reason for CRM: patient received call from office on yesterday 01/14/2017 and would like to a call back from office

## 2017-01-15 NOTE — Telephone Encounter (Signed)
fyi

## 2017-01-15 NOTE — Telephone Encounter (Signed)
COURTESY REFILL 30 DAYS ONLY .  MUST BE SEEN

## 2017-01-18 ENCOUNTER — Encounter: Payer: Self-pay | Admitting: *Deleted

## 2017-01-18 NOTE — Telephone Encounter (Signed)
Patient scheduled appointment and stated he medication til appointment.

## 2017-01-20 NOTE — Telephone Encounter (Signed)
Appointment made

## 2017-01-27 ENCOUNTER — Ambulatory Visit: Payer: Medicare Other | Admitting: Internal Medicine

## 2017-02-01 ENCOUNTER — Encounter: Payer: Self-pay | Admitting: Internal Medicine

## 2017-02-01 ENCOUNTER — Ambulatory Visit: Payer: Medicare Other | Admitting: Internal Medicine

## 2017-02-01 VITALS — BP 160/88 | HR 48 | Temp 97.7°F | Resp 15 | Ht 73.0 in | Wt 205.8 lb

## 2017-02-01 DIAGNOSIS — B9789 Other viral agents as the cause of diseases classified elsewhere: Secondary | ICD-10-CM

## 2017-02-01 DIAGNOSIS — Z0000 Encounter for general adult medical examination without abnormal findings: Secondary | ICD-10-CM

## 2017-02-01 DIAGNOSIS — R05 Cough: Secondary | ICD-10-CM | POA: Diagnosis not present

## 2017-02-01 DIAGNOSIS — Z8546 Personal history of malignant neoplasm of prostate: Secondary | ICD-10-CM

## 2017-02-01 DIAGNOSIS — E785 Hyperlipidemia, unspecified: Secondary | ICD-10-CM

## 2017-02-01 DIAGNOSIS — Z85828 Personal history of other malignant neoplasm of skin: Secondary | ICD-10-CM | POA: Diagnosis not present

## 2017-02-01 DIAGNOSIS — R059 Cough, unspecified: Secondary | ICD-10-CM

## 2017-02-01 DIAGNOSIS — I10 Essential (primary) hypertension: Secondary | ICD-10-CM

## 2017-02-01 DIAGNOSIS — L570 Actinic keratosis: Secondary | ICD-10-CM | POA: Diagnosis not present

## 2017-02-01 DIAGNOSIS — J069 Acute upper respiratory infection, unspecified: Secondary | ICD-10-CM

## 2017-02-01 DIAGNOSIS — F5102 Adjustment insomnia: Secondary | ICD-10-CM

## 2017-02-01 MED ORDER — PREDNISONE 10 MG PO TABS: ORAL_TABLET | ORAL | 0 refills | Status: DC

## 2017-02-01 MED ORDER — LEVOFLOXACIN 500 MG PO TABS: 500.0000 mg | ORAL_TABLET | Freq: Every day | ORAL | 0 refills | Status: DC

## 2017-02-01 MED ORDER — BENZONATATE 200 MG PO CAPS: 200.0000 mg | ORAL_CAPSULE | Freq: Three times a day (TID) | ORAL | 1 refills | Status: DC | PRN

## 2017-02-01 MED ORDER — ALPRAZOLAM 0.5 MG PO TABS: 0.5000 mg | ORAL_TABLET | Freq: Every evening | ORAL | 5 refills | Status: DC | PRN

## 2017-02-01 NOTE — Patient Instructions (Addendum)
You have a viral  Bronchitis   Flush your sinuses once or  twice daily with  NeilMed's sinus rinse for the runny nose/congestion .  Prednisone taper for the inflammation   Use benadryl 25 mg at bedtime  for the post nasal drip and Afrin nasal spray every 12 hours as needed for the congestion.  Gargle with salt water as needed for the sore throat.Tessalon cough capsules for the cough  If you develop T > 100.4,  Green nasal discharge,  Or facial pain,  Start the Levaquin antibiotic    Tessalon perles  for   For the cough  If you dont' feel better in 3 days,  Call and I will add an antibiotic    your blood pressure is up today   Check it at home a few times after you are feeling  better and send readings to me.

## 2017-02-01 NOTE — Progress Notes (Signed)
Patient ID: Bobby Murillo, male    DOB: [birth year]  Age: 76 y.o. [name]: [id number]  The patient is here for annual preventive examination and management of other chronic and acute problems.   The risk factors are reflected in the social history.  The roster of all physicians providing medical care to patient - is listed in the Snapshot section of the chart.  Activities of daily living:  The patient is 100% independent in all ADLs: dressing, toileting, feeding as well as independent mobility  Home safety : The patient has smoke detectors in the home. They wear seatbelts.  There are no firearms at home. There is no violence in the home.   There is no risks for hepatitis, STDs or HIV. There is no   history of blood transfusion. They have no travel history to infectious disease endemic areas of the world.  The patient has seen their dentist in the last six month. They have seen their eye doctor in the last year. They admit to slight hearing difficulty with regard to whispered voices and some television programs.  They have deferred audiologic testing in the last year.  They do not  have excessive sun exposure. Discussed the need for sun protection: hats, long sleeves and use of sunscreen if there is significant sun exposure.   Diet: the importance of a healthy diet is discussed. They do have a healthy diet.  The benefits of regular aerobic exercise were discussed. She walks 4 times per week ,  20 minutes.   Depression screen: there are no signs or vegative symptoms of depression- irritability, change in appetite, anhedonia, sadness/tearfullness.  Cognitive assessment: the patient manages all their financial and personal affairs and is actively engaged. They could relate day,date,year and events; recalled 2/3 objects at 3 minutes; performed clock-face test normally.  The following portions of the patient's history were reviewed and updated as appropriate: allergies, current medications, past family  history, past medical history,  past surgical history, past social history  and problem list.  Visual acuity was not assessed per patient preference since she has regular follow up with her ophthalmologist. Hearing and body mass index were assessed and reviewed.   During the course of the visit the patient was educated and counseled about appropriate screening and preventive services including : fall prevention , diabetes screening, nutrition counseling, colorectal cancer screening, and recommended immunizations.    CC: The primary encounter diagnosis was History of prostate cancer. Diagnoses of Essential hypertension, Cough, Hyperlipidemia LDL goal <100, Viral URI with cough, Insomnia due to psychological stress, and Encounter for preventive health examination were also pertinent to this visit.  medication refill  ; uses alprazolam to fall asleep most nights   URI symptoms for the past week,  .  scratchy throat  ,  Runny nose,  Productive cough with green sputum .  Denies fevers.   Body aches    Prostate cancer 2010 last psa was 0 in 2017  Has not seen Dr Darcus Austin in several years.     Last DEX 2012 Last psa 0 oct 2017  Elevated blood pressure today  Does not check at home.  No regular use of NSAIDs.      History Isam has a past medical history of Abnormal LFTs, Anxiety, Basal cell carcinoma (July 2012), Hyperlipidemia, prostate cancer (2010), and Sinus bradycardia by electrocardiogram.   He has a past surgical history that includes Prostatectomy (11/2008).   His family history includes Alzheimer's disease in his father, mother,  and sister; Heart disease (age of onset: 23) in his paternal uncle; Stroke in his father and paternal aunt.He reports that  has never smoked. he has never used smokeless tobacco. He reports that he drinks about 8.4 oz of alcohol per week. He reports that he does not use drugs.  Outpatient Medications Prior to Visit  Medication Sig Dispense Refill  . acetaminophen  (TYLENOL) 325 MG tablet Take 650 mg by mouth every 6 (six) hours as needed.      Marland Kitchen aspirin 81 MG tablet Take 1 tablet by mouth daily. 1 tab by mouth daily    . Glucosamine-Chondroitin (GLUCOSAMINE CHONDR COMPLEX PO) Take 2 tablets by mouth daily. tab by mouth daily    . lisinopril (PRINIVIL,ZESTRIL) 5 MG tablet TAKE ONE TABLET BY MOUTH ONCE DAILY 90 tablet 2  . naproxen (NAPROSYN) 250 MG tablet Take 250 mg by mouth as needed.      . polycarbophil (FIBERCON) 625 MG tablet Take 625 mg by mouth daily. Reported on 05/29/2015    . ALPRAZolam (XANAX) 0.5 MG tablet TAKE 1 TABLET BY MOUTH AT BEDTIME AS NEEDED FOR SLEEP 30 tablet 0  . zoster vaccine live, PF, (ZOSTAVAX) 51884 UNT/0.65ML injection Inject 19,400 Units into the skin once. (Patient not taking: Reported on 02/01/2017) 1 each 0   No facility-administered medications prior to visit.     Review of Systems   Patient denies headache, fevers, malaise, unintentional weight loss, skin rash, eye pain, sinus congestion and sinus pain, sore throat, dysphagia,  hemoptysis , cough, dyspnea, wheezing, chest pain, palpitations, orthopnea, edema, abdominal pain, nausea, melena, diarrhea, constipation, flank pain, dysuria, hematuria, urinary  Frequency, nocturia, numbness, tingling, seizures,  Focal weakness, Loss of consciousness,  Tremor, insomnia, depression, anxiety, and suicidal ideation.      Objective:  BP (!) 160/88 (BP Location: Left Arm, Patient Position: Sitting, Cuff Size: Normal)   Pulse (!) 48   Temp 97.7 F (36.5 C) (Oral)   Resp 15   Ht 6\' 1"  (1.854 m)   Wt 205 lb 12.8 oz (93.4 kg)   SpO2 97%   BMI 27.15 kg/m   Physical Exam   General appearance: alert, cooperative and appears stated age Ears: normal TM's and external ear canals both ears Throat: lips, mucosa, and tongue normal; teeth and gums normal Neck: no adenopathy, no carotid bruit, supple, symmetrical, trachea midline and thyroid not enlarged, symmetric, no  tenderness/mass/nodules Back: symmetric, no curvature. ROM normal. No CVA tenderness. Lungs: clear to auscultation bilaterally Heart: regular rate and rhythm, S1, S2 normal, no murmur, click, rub or gallop Abdomen: soft, non-tender; bowel sounds normal; no masses,  no organomegaly Pulses: 2+ and symmetric Skin: Skin color, texture, turgor normal. No rashes or lesions Lymph nodes: Cervical, supraclavicular, and axillary nodes normal.   Assessment & Plan:   Problem List Items Addressed This Visit    Encounter for preventive health examination    Annual comprehensive preventive exam was done as well as an evaluation and management of acute and chronic conditions .  During the course of the visit the patient was educated and counseled about appropriate screening and preventive services including :  diabetes screening, lipid analysis with projected  10 year  risk for CAD , nutrition counseling, prostate and colorectal cancer screening, and recommended immunizations.  Printed recommendations for health maintenance screenings was given.       Essential hypertension    he reports compliance with medication regimen  but has an elevated reading today in office.  He has been asked to check his BP at work and  submit readings for evaluation. Renal function will be checked today.  Lab Results  Component Value Date   CREATININE 1.33 02/01/2017   Lab Results  Component Value Date   NA 139 02/01/2017   K 4.8 02/01/2017   CL 103 02/01/2017   CO2 28 02/01/2017         Relevant Orders   Lipid panel (Completed)   Comprehensive metabolic panel (Completed)   History of prostate cancer - Primary    PSA was undetectable for years, followed by a rise, which was treated with XRT .  Annual PSA is not detectable    Lab Results  Component Value Date   PSA 0.00 (L) 02/01/2017   PSA 0.00 (L) 12/30/2015   PSA 0.00 (L) 11/28/2014         Relevant Orders   PSA, Medicare (Completed)   Hyperlipidemia  LDL goal <100 (Chronic)    Using the Framingham risk calculator,  his 10 year risk of coronary artery disease is 54%.  He is statin intolerant. wil recommend Repatha.  Lab Results  Component Value Date   CHOL 278 (H) 02/01/2017   HDL 57.30 02/01/2017   LDLCALC 200 (H) 02/01/2017   LDLDIRECT 184.0 12/30/2015   TRIG 107.0 02/01/2017   CHOLHDL 5 02/01/2017   Lab Results  Component Value Date   ALT 25 02/01/2017   AST 24 02/01/2017   ALKPHOS 89 02/01/2017   BILITOT 1.0 02/01/2017         Insomnia due to psychological stress    Chronic, with no improvement using over-the-counter first generation antihistamines. Alprazolam refilled.  The risks and benefits of benzodiazepine use were discussed with patient today including excessive sedation leading to respiratory depression,  impaired thinking/driving, and addiction.  Patient was advised to avoid concurrent use with alcohol, to use medication only as needed and not to share with others  .        Viral URI with cough    Supportive care outlined,  Instructed to add antibiotic in 3 days if no improvement in  3 days or if symptoms escalate.        Other Visit Diagnoses    Cough       Relevant Medications   predniSONE (DELTASONE) 10 MG tablet   Other Relevant Orders   CBC with Differential/Platelet (Completed)      I have discontinued Caroll Rancher. Tse's zoster vaccine live (PF). I have also changed his ALPRAZolam. Additionally, I am having him start on predniSONE, levofloxacin, and benzonatate. Lastly, I am having him maintain his acetaminophen, naproxen, polycarbophil, Glucosamine-Chondroitin (GLUCOSAMINE CHONDR COMPLEX PO), aspirin, and lisinopril.  Meds ordered this encounter  Medications  . predniSONE (DELTASONE) 10 MG tablet    Sig: 6 tablets on Day 1 , then reduce by 1 tablet daily until gone    Dispense:  21 tablet    Refill:  0  . levofloxacin (LEVAQUIN) 500 MG tablet    Sig: Take 1 tablet (500 mg total) daily by mouth.     Dispense:  7 tablet    Refill:  0  . benzonatate (TESSALON) 200 MG capsule    Sig: Take 1 capsule (200 mg total) 3 (three) times daily as needed by mouth for cough.    Dispense:  60 capsule    Refill:  1  . ALPRAZolam (XANAX) 0.5 MG tablet    Sig: Take 1 tablet (0.5 mg total) at bedtime as  needed by mouth. for sleep    Dispense:  30 tablet    Refill:  5    Refill for 30 days only.  OFFICE VISIT NEEDED prior to any more refills    Medications Discontinued During This Encounter  Medication Reason  . zoster vaccine live, PF, (ZOSTAVAX) 23536 UNT/0.65ML injection Prescription never filled  . ALPRAZolam (XANAX) 0.5 MG tablet Reorder    Follow-up: No Follow-up on file.   Crecencio Mc, MD

## 2017-02-02 ENCOUNTER — Encounter: Payer: Self-pay | Admitting: Internal Medicine

## 2017-02-02 DIAGNOSIS — F5102 Adjustment insomnia: Secondary | ICD-10-CM | POA: Insufficient documentation

## 2017-02-02 DIAGNOSIS — J069 Acute upper respiratory infection, unspecified: Secondary | ICD-10-CM | POA: Insufficient documentation

## 2017-02-02 DIAGNOSIS — Z0000 Encounter for general adult medical examination without abnormal findings: Secondary | ICD-10-CM | POA: Insufficient documentation

## 2017-02-02 DIAGNOSIS — B9789 Other viral agents as the cause of diseases classified elsewhere: Secondary | ICD-10-CM

## 2017-02-02 LAB — COMPREHENSIVE METABOLIC PANEL
ALK PHOS: 89 U/L (ref 39–117)
ALT: 25 U/L (ref 0–53)
AST: 24 U/L (ref 0–37)
Albumin: 4 g/dL (ref 3.5–5.2)
BILIRUBIN TOTAL: 1 mg/dL (ref 0.2–1.2)
BUN: 22 mg/dL (ref 6–23)
CALCIUM: 10 mg/dL (ref 8.4–10.5)
CO2: 28 mEq/L (ref 19–32)
Chloride: 103 mEq/L (ref 96–112)
Creatinine, Ser: 1.33 mg/dL (ref 0.40–1.50)
GFR: 55.77 mL/min — AB (ref >60.00)
Glucose, Bld: 91 mg/dL (ref 70–99)
Potassium: 4.8 mEq/L (ref 3.5–5.1)
Sodium: 139 mEq/L (ref 135–145)
TOTAL PROTEIN: 6.6 g/dL (ref 6.0–8.3)

## 2017-02-02 LAB — CBC WITH DIFFERENTIAL/PLATELET
BASOS ABS: 0.1 10*3/uL (ref 0.0–0.1)
Basophils Relative: 1 % (ref 0.0–3.0)
Eosinophils Absolute: 0.3 10*3/uL (ref 0.0–0.7)
Eosinophils Relative: 4.9 % (ref 0.0–5.0)
HEMATOCRIT: 50 % (ref 39.0–52.0)
HEMOGLOBIN: 16.9 g/dL (ref 13.0–17.0)
LYMPHS ABS: 1.1 10*3/uL (ref 0.7–4.0)
LYMPHS PCT: 17 % (ref 12.0–46.0)
MCHC: 33.8 g/dL (ref 30.0–36.0)
MCV: 92.5 fl (ref 78.0–100.0)
MONOS PCT: 15.3 % — AB (ref 3.0–12.0)
Monocytes Absolute: 1 10*3/uL (ref 0.1–1.0)
NEUTROS PCT: 61.8 % (ref 43.0–77.0)
Neutro Abs: 3.8 10*3/uL (ref 1.4–7.7)
Platelets: 244 10*3/uL (ref 150.0–400.0)
RBC: 5.41 Mil/uL (ref 4.22–5.81)
RDW: 13.1 % (ref 11.5–15.5)
WBC: 6.2 10*3/uL (ref 4.0–10.5)

## 2017-02-02 LAB — LIPID PANEL
Cholesterol: 278 mg/dL — ABNORMAL HIGH (ref 0–200)
HDL: 57.3 mg/dL (ref >39.00)
LDL Cholesterol: 200 mg/dL — ABNORMAL HIGH (ref 0–99)
NonHDL: 221.04
TRIGLYCERIDES: 107 mg/dL (ref 0.0–149.0)
Total CHOL/HDL Ratio: 5
VLDL: 21.4 mg/dL (ref 0.0–40.0)

## 2017-02-02 LAB — PSA, MEDICARE: PSA: 0 ng/mL — AB (ref 0.10–4.00)

## 2017-02-02 NOTE — Assessment & Plan Note (Signed)
Chronic, with no improvement using over-the-counter first generation antihistamines. Alprazolam refilled.  The risks and benefits of benzodiazepine use were discussed with patient today including excessive sedation leading to respiratory depression,  impaired thinking/driving, and addiction.  Patient was advised to avoid concurrent use with alcohol, to use medication only as needed and not to share with others  .

## 2017-02-02 NOTE — Assessment & Plan Note (Signed)
Supportive care outlined,  Instructed to add antibiotic in 3 days if no improvement in  3 days or if symptoms escalate.

## 2017-02-02 NOTE — Assessment & Plan Note (Signed)
Using the Framingham risk calculator,  his 10 year risk of coronary artery disease is 54%.  He is statin intolerant. wil recommend Repatha.  Lab Results  Component Value Date   CHOL 278 (H) 02/01/2017   HDL 57.30 02/01/2017   LDLCALC 200 (H) 02/01/2017   LDLDIRECT 184.0 12/30/2015   TRIG 107.0 02/01/2017   CHOLHDL 5 02/01/2017   Lab Results  Component Value Date   ALT 25 02/01/2017   AST 24 02/01/2017   ALKPHOS 89 02/01/2017   BILITOT 1.0 02/01/2017

## 2017-02-02 NOTE — Assessment & Plan Note (Signed)
PSA was undetectable for years, followed by a rise, which was treated with XRT .  Annual PSA is not detectable    Lab Results  Component Value Date   PSA 0.00 (L) 02/01/2017   PSA 0.00 (L) 12/30/2015   PSA 0.00 (L) 11/28/2014

## 2017-02-02 NOTE — Assessment & Plan Note (Signed)

## 2017-02-02 NOTE — Assessment & Plan Note (Addendum)
he reports compliance with medication regimen  but has an elevated reading today in office.  He has been asked to check his BP at work and  submit readings for evaluation. Renal function will be checked today.  Lab Results  Component Value Date   CREATININE 1.33 02/01/2017   Lab Results  Component Value Date   NA 139 02/01/2017   K 4.8 02/01/2017   CL 103 02/01/2017   CO2 28 02/01/2017

## 2017-03-29 ENCOUNTER — Ambulatory Visit (INDEPENDENT_AMBULATORY_CARE_PROVIDER_SITE_OTHER): Payer: Medicare Other

## 2017-03-29 VITALS — BP 130/70 | HR 72 | Temp 97.7°F | Resp 15 | Ht 72.0 in | Wt 204.8 lb

## 2017-03-29 DIAGNOSIS — Z0000 Encounter for general adult medical examination without abnormal findings: Secondary | ICD-10-CM

## 2017-03-29 DIAGNOSIS — Z23 Encounter for immunization: Secondary | ICD-10-CM | POA: Diagnosis not present

## 2017-03-29 DIAGNOSIS — Z1331 Encounter for screening for depression: Secondary | ICD-10-CM

## 2017-03-29 NOTE — Patient Instructions (Addendum)
  Bobby Murillo , Thank you for taking time to come for your Medicare Wellness Visit. I appreciate your ongoing commitment to your health goals. Please review the following plan we discussed and let me know if I can assist you in the future.   Follow up with Dr. Derrel Nip  as needed.    Bring a copy of your Lindsey and/or Living Will to be scanned into chart.  Have a great day!  These are the goals we discussed: Goals    . Healthy Lifestyle      Low carb food Stay hydrated  Exercise       This is a list of the screening recommended for you and due dates:  Health Maintenance  Topic Date Due  . Flu Shot  06/27/2017*  . Colon Cancer Screening  10/27/2020  . Tetanus Vaccine  10/16/2022  . Pneumonia vaccines  Completed  *Topic was postponed. The date shown is not the original due date.

## 2017-03-29 NOTE — Progress Notes (Signed)
Subjective:   Bobby Murillo is a 77 y.o. male who presents for Medicare Annual/Subsequent preventive examination.  Review of Systems:  No ROS.  Medicare Wellness Visit. Additional risk factors are reflected in the social history. Cardiac Risk Factors include: advanced age (>32men, >70 women);hypertension;male gender     Objective:    Vitals: BP 130/70 (BP Location: Left Arm, Patient Position: Sitting, Cuff Size: Normal)   Pulse 72   Temp 97.7 F (36.5 C) (Oral)   Resp 15   Ht 6' (1.829 m)   Wt 204 lb 12.8 oz (92.9 kg)   SpO2 96%   BMI 27.78 kg/m   Body mass index is 27.78 kg/m.  Advanced Directives 03/29/2017  Does Patient Have a Medical Advance Directive? Yes  Type of Advance Directive Living will;Healthcare Power of Attorney  Does patient want to make changes to medical advance directive? No - Patient declined  Copy of [place] in Chart? No - copy requested    Tobacco Social History   Tobacco Use  Smoking Status Never Smoker  Smokeless Tobacco Never Used     Counseling given: Not Answered   Clinical Intake:  Pre-visit preparation completed: Yes  Pain : No/denies pain     Nutritional Status: BMI 25 -29 Overweight Diabetes: No  How often do you need to have someone help you when you read instructions, pamphlets, or other written materials from your doctor or pharmacy?: 1 - Never  Interpreter Needed?: No     Past Medical History:  Diagnosis Date  . Abnormal LFTs   . Anxiety   . Basal cell carcinoma July 2012   left calf   . Hyperlipidemia   . prostate cancer 2010   Dr. Ronny Bacon  . Sinus bradycardia by electrocardiogram    Past Surgical History:  Procedure Laterality Date  . PROSTATECTOMY  11/2008   transurethral, radical, Dr. Darcus Austin   Family History  Problem Relation Age of Onset  . Alzheimer's disease Mother   . Stroke Father   . Alzheimer's disease Sister   . Stroke Paternal Aunt   . Heart disease Paternal Uncle 77    Social History   Socioeconomic History  . Marital status: Married    Spouse name: None  . Number of children: None  . Years of education: None  . Highest education level: None  Social Needs  . Financial resource strain: None  . Food insecurity - worry: None  . Food insecurity - inability: None  . Transportation needs - medical: None  . Transportation needs - non-medical: None  Occupational History  . None  Tobacco Use  . Smoking status: Never Smoker  . Smokeless tobacco: Never Used  Substance and Sexual Activity  . Alcohol use: Yes    Alcohol/week: 8.4 oz    Types: 7 Glasses of wine, 7 Shots of liquor per week  . Drug use: No  . Sexual activity: Yes  Other Topics Concern  . None  Social History Narrative  . None    Outpatient Encounter Medications as of 03/29/2017  Medication Sig  . acetaminophen (TYLENOL) 325 MG tablet Take 650 mg by mouth every 6 (six) hours as needed.    . ALPRAZolam (XANAX) 0.5 MG tablet Take 1 tablet (0.5 mg total) at bedtime as needed by mouth. for sleep  . aspirin 81 MG tablet Take 1 tablet by mouth daily. 1 tab by mouth daily  . Glucosamine-Chondroitin (GLUCOSAMINE CHONDR COMPLEX PO) Take 2 tablets by mouth daily. tab  by mouth daily  . lisinopril (PRINIVIL,ZESTRIL) 5 MG tablet TAKE ONE TABLET BY MOUTH ONCE DAILY  . naproxen (NAPROSYN) 250 MG tablet Take 250 mg by mouth as needed.    . polycarbophil (FIBERCON) 625 MG tablet Take 625 mg by mouth daily. Reported on 05/29/2015  . [DISCONTINUED] benzonatate (TESSALON) 200 MG capsule Take 1 capsule (200 mg total) 3 (three) times daily as needed by mouth for cough.  . [DISCONTINUED] levofloxacin (LEVAQUIN) 500 MG tablet Take 1 tablet (500 mg total) daily by mouth.  . [DISCONTINUED] predniSONE (DELTASONE) 10 MG tablet 6 tablets on Day 1 , then reduce by 1 tablet daily until gone   No facility-administered encounter medications on file as of 03/29/2017.     Activities of Daily Living In your present  state of health, do you have any difficulty performing the following activities: 03/29/2017  Hearing? N  Vision? N  Difficulty concentrating or making decisions? N  Walking or climbing stairs? N  Dressing or bathing? N  Doing errands, shopping? N  Preparing Food and eating ? N  Using the Toilet? N  In the past six months, have you accidently leaked urine? N  Do you have problems with loss of bowel control? N  Managing your Medications? N  Managing your Finances? N  Housekeeping or managing your Housekeeping? N  Some recent data might be hidden    Patient Care Team: Crecencio Mc, MD as PCP - General (Internal Medicine)   Assessment:   This is a routine wellness examination for [name]. The goal of the wellness visit is to assist the patient how to close the gaps in care and create a preventative care plan for the patient.   The roster of all physicians providing medical care to patient is listed in the Snapshot section of the chart.  Taking calcium VIT D as appropriate/Osteoporosis risk reviewed.    Safety issues reviewed; Smoke and carbon monoxide detectors in the home. Firearms locked up in the home. Wears seatbelts when driving or riding with others. Patient does wear sunscreen or protective clothing when in direct sunlight. No violence in the home.  Depression- PHQ 2 &9 complete.  No signs/symptoms or verbal communication regarding little pleasure in doing things, feeling down, depressed or hopeless. No changes in sleeping, energy, eating, concentrating.  No thoughts of self harm or harm towards others.  Time spent on this topic is 8 minutes.   Patient is alert, normal appearance, oriented to person/place/and time. Correctly identified the president of the [place], recall of 3/3 words, and performing simple calculations. Displays appropriate judgement and can read correct time from watch face.   No new identified risk were noted.  No failures at ADL's or IADL's.    BMI-  discussed the importance of a healthy diet, water intake and the benefits of aerobic exercise. Educational material provided.   24 hour diet recall: Breakfast: Bacon, eggs, 1 whole wheat toast Lunch: [place] [employer] sandwich Dinner: vegetable soup Snack: 2 chocolate chip cookies, ice cream  Daily fluid intake: 2 cups of caffeine, 3 cups of water  Dental- every 4 months.  Eye- Visual acuity not assessed per patient preference since they have regular follow up with the ophthalmologist.  Wears corrective lenses.  Sleep patterns- Sleeps 7-8 hours at night.  Wakes feeling rested.  Taking medication as prescribed.  HTN; followed by PCP.  Notes home readings fluctuate.  No headaches, blurred vision or chest pain, continue to monitor and take medication as directed.  Follow up with PCP.    Influenza vaccine administered L deltoid, tolerated well. Educational material provided.  Health maintenance gaps- closed.  Patient Concerns: None at this time. Follow up with PCP as needed.  Exercise Activities and Dietary recommendations    Goals    . Healthy Lifestyle      Low carb food Stay hydrated  Exercise       Fall Risk Fall Risk  03/29/2017 02/01/2017 05/29/2015 11/08/2013 11/08/2013  Falls in the past year? No Yes No Yes Yes  Number falls in past yr: - 1 - 1 1  Injury with Fall? No No - No No  Risk for fall due to : - - - - Other (Comment);Impaired balance/gait  Risk for fall due to: Comment - - - - tripped on the stairs carrying a box.   Depression Screen PHQ 2/9 Scores 03/29/2017 02/01/2017 11/08/2013 11/08/2013  PHQ - 2 Score 0 0 0 0  PHQ- 9 Score 0 1 - -    Cognitive Function     6CIT Screen 03/29/2017  What Year? 0 points  What month? 0 points  What time? 0 points  Count back from 20 0 points  Months in reverse 0 points  Repeat phrase 0 points  Total Score 0    Immunization History  Administered Date(s) Administered  . Influenza Split 02/13/2011  . Influenza, High  Dose Seasonal PF 12/30/2015  . Influenza,inj,Quad PF,6+ Mos 11/08/2013, 11/30/2014, 03/29/2017  . Influenza-Unspecified 12/30/2012  . Pneumococcal Conjugate-13 11/08/2013  . Pneumococcal Polysaccharide-23 02/13/2011  . Tdap 10/15/2012    Screening Tests Health Maintenance  Topic Date Due  . INFLUENZA VACCINE  06/27/2017 (Originally 10/14/2016)  . COLONOSCOPY  10/27/2020  . TETANUS/TDAP  10/16/2022  . PNA vac Low Risk Adult  Completed      Plan:    End of life planning; Advance aging; Advanced directives discussed. Copy of current HCPOA/Living Will requested.    I have personally reviewed and noted the following in the patient's chart:   . Medical and social history . Use of alcohol, tobacco or illicit drugs  . Current medications and supplements . Functional ability and status . Nutritional status . Physical activity . Advanced directives . List of other physicians . Hospitalizations, surgeries, and ER visits in previous 12 months . Vitals . Screenings to include cognitive, depression, and falls . Referrals and appointments  In addition, I have reviewed and discussed with patient certain preventive protocols, quality metrics, and best practice recommendations. A written personalized care plan for preventive services as well as general preventive health recommendations were provided to patient.     OBrien-Blaney, Cooper Stamp L, LPN  7/98/9211   I have reviewed the above information and agree with above.   Deborra Medina, MD

## 2017-03-30 ENCOUNTER — Other Ambulatory Visit: Payer: Self-pay | Admitting: Internal Medicine

## 2017-04-08 ENCOUNTER — Telehealth: Payer: Self-pay

## 2017-04-08 DIAGNOSIS — M19079 Primary osteoarthritis, unspecified ankle and foot: Secondary | ICD-10-CM | POA: Diagnosis not present

## 2017-04-08 NOTE — Telephone Encounter (Signed)
Copied from [place] [id number]. Topic: Appointment Scheduling - Scheduling Inquiry for Clinic >> Apr 08, 2017  1:57 PM Synthia Innocent wrote: Reason for CRM: Patient having left foot pain, requesting to be seen tomorrow by Dr Derrel Nip. No appts available. Please advise

## 2017-04-08 NOTE — Telephone Encounter (Signed)
Informed patient that we have no appointments available until next week. I then suggested walking in @ [facility] due to the symptoms he has.  Patient stated he will go there to be seen today.

## 2017-05-03 DIAGNOSIS — H524 Presbyopia: Secondary | ICD-10-CM | POA: Diagnosis not present

## 2017-05-03 DIAGNOSIS — H2513 Age-related nuclear cataract, bilateral: Secondary | ICD-10-CM | POA: Diagnosis not present

## 2017-05-03 DIAGNOSIS — H52223 Regular astigmatism, bilateral: Secondary | ICD-10-CM | POA: Diagnosis not present

## 2017-05-03 DIAGNOSIS — H25032 Anterior subcapsular polar age-related cataract, left eye: Secondary | ICD-10-CM | POA: Diagnosis not present

## 2017-08-02 ENCOUNTER — Ambulatory Visit: Payer: Medicare Other | Admitting: Internal Medicine

## 2017-08-02 ENCOUNTER — Encounter: Payer: Self-pay | Admitting: Internal Medicine

## 2017-08-02 VITALS — BP 146/84 | HR 46 | Temp 97.5°F | Resp 15 | Ht 72.0 in | Wt 207.6 lb

## 2017-08-02 DIAGNOSIS — I10 Essential (primary) hypertension: Secondary | ICD-10-CM

## 2017-08-02 DIAGNOSIS — M170 Bilateral primary osteoarthritis of knee: Secondary | ICD-10-CM

## 2017-08-02 DIAGNOSIS — E785 Hyperlipidemia, unspecified: Secondary | ICD-10-CM | POA: Diagnosis not present

## 2017-08-02 DIAGNOSIS — F5102 Adjustment insomnia: Secondary | ICD-10-CM | POA: Diagnosis not present

## 2017-08-02 MED ORDER — LISINOPRIL 10 MG PO TABS: 5.0000 mg | ORAL_TABLET | Freq: Every day | ORAL | 1 refills | Status: DC

## 2017-08-02 MED ORDER — ALPRAZOLAM 0.5 MG PO TABS: 0.5000 mg | ORAL_TABLET | Freq: Every evening | ORAL | 5 refills | Status: DC | PRN

## 2017-08-02 NOTE — Patient Instructions (Addendum)
I have increased your dose of lisnopril to 10 mg daily  OUr Goal is  To lower your blood pressure to  between 110/80 and 130/80  If tolerated ,  I  Will renew at 89  Next time   For your cholesterol   Please start  a trial of red Yeast Rice as a natural remedy, along with a lower salt diet,  For your cholesterol and hypertension . The natural l remedies for cholesterol have not been proven to reduce your risk for a heart attack, but  it  does lower cholesterol, so if you want to try it , the dose is 600 mg twice daily in capsule form, available OTC.  It does require monitoring of liver enzymes,  Just like the statins,  So if you decide to start it,  I would like you to return in early July for fasting labs

## 2017-08-02 NOTE — Progress Notes (Signed)
Subjective:  Patient ID: Bobby Murillo, male    DOB: 1940/04/07  Age: 77 y.o. [name]: [id number]  CC: The primary encounter diagnosis was Hyperlipidemia LDL goal <100. Diagnoses of Primary osteoarthritis of both knees, Essential hypertension, and Insomnia due to psychological stress were also pertinent to this visit.  HPI Bobby Murillo presents for 5 month follow up on hyperlipidemia, hypertension and insomnia   Occasional pain in left CHEST WALL,  near axilla. Occurs at rest , not during tennis.   Some nocturnal loss of balance with voiding .  Better by end of bathroom trip.  [place] out of  bed once . Sleeps on side at edge of bed Has been having parathesias of left anterior thigh,  Similar to when cell phone is on "silent"  Occurs once daily . Wears a belt daily  Left knee stiff, painful  and unable to fully flex,  Feels like he hyper expands it at times. Using a brace for  tennis . Does not want to. See Ortho.  Used to see Margaretmary Eddy , does not want to  see ortho yet  History of left foot paim due to severe DJD fo big toe  MCP   Improved now   Urinary incontinence from prostatectomy resolved   Hypertension with elevated reading today. Home checks not done in a whil  Uses salt shaker daily    Statin intolerant .  Does not want to try Zetia . Red Yeast rice discussed  Strong FH of CVA, not AD,  Wants to continue aspirin   Outpatient Medications Prior to Visit  Medication Sig Dispense Refill  . acetaminophen (TYLENOL) 325 MG tablet Take 650 mg by mouth every 6 (six) hours as needed.      Marland Kitchen aspirin 81 MG tablet Take 1 tablet by mouth daily. 1 tab by mouth daily    . Glucosamine-Chondroitin (GLUCOSAMINE CHONDR COMPLEX PO) Take 2 tablets by mouth daily. tab by mouth daily    . naproxen (NAPROSYN) 250 MG tablet Take 250 mg by mouth as needed.      . polycarbophil (FIBERCON) 625 MG tablet Take 625 mg by mouth daily. Reported on 05/29/2015    . ALPRAZolam (XANAX) 0.5 MG tablet Take 1 tablet  (0.5 mg total) at bedtime as needed by mouth. for sleep 30 tablet 5  . lisinopril (PRINIVIL,ZESTRIL) 5 MG tablet TAKE 1 TABLET BY MOUTH ONCE DAILY 90 tablet 2   No facility-administered medications prior to visit.     Review of Systems;  Patient denies headache, fevers, malaise, unintentional weight loss, skin rash, eye pain, sinus congestion and sinus pain, sore throat, dysphagia,  hemoptysis , cough, dyspnea, wheezing, chest pain, palpitations, orthopnea, edema, abdominal pain, nausea, melena, diarrhea, constipation, flank pain, dysuria, hematuria, urinary  Frequency, nocturia, numbness, tingling, seizures,  Focal weakness, Loss of consciousness,  Tremor, insomnia, depression, anxiety, and suicidal ideation.      Objective:  BP (!) 146/84 (BP Location: Left Arm, Patient Position: Sitting, Cuff Size: Large)   Pulse (!) 46   Temp (!) 97.5 F (36.4 C) (Oral)   Resp 15   Ht 6' (1.829 m)   Wt 207 lb 9.6 oz (94.2 kg)   SpO2 95%   BMI 28.16 kg/m   BP Readings from Last 3 Encounters:  08/02/17 (!) 146/84  03/29/17 130/70  02/01/17 (!) 160/88    Wt Readings from Last 3 Encounters:  08/02/17 207 lb 9.6 oz (94.2 kg)  03/29/17 204 lb 12.8 oz (92.9  kg)  02/01/17 205 lb 12.8 oz (93.4 kg)    General appearance: alert, cooperative and appears stated age Ears: normal TM's and external ear canals both ears Throat: lips, mucosa, and tongue normal; teeth and gums normal Neck: no adenopathy, no carotid bruit, supple, symmetrical, trachea midline and thyroid not enlarged, symmetric, no tenderness/mass/nodules Back: symmetric, no curvature. ROM normal. No CVA tenderness. Lungs: clear to auscultation bilaterally Heart: regular rate and rhythm, S1, S2 normal, no murmur, click, rub or gallop Abdomen: soft, non-tender; bowel sounds normal; no masses,  no organomegaly Pulses: 2+ and symmetric Skin: Skin color, texture, turgor normal. No rashes or lesions Lymph nodes: Cervical, supraclavicular,  and axillary nodes normal.  No results found for: HGBA1C  Lab Results  Component Value Date   CREATININE 1.33 02/01/2017   CREATININE 1.26 12/30/2015   CREATININE 1.32 05/29/2015    Lab Results  Component Value Date   WBC 6.2 02/01/2017   HGB 16.9 02/01/2017   HCT 50.0 02/01/2017   PLT 244.0 02/01/2017   GLUCOSE 91 02/01/2017   CHOL 278 (H) 02/01/2017   TRIG 107.0 02/01/2017   HDL 57.30 02/01/2017   LDLDIRECT 184.0 12/30/2015   LDLCALC 200 (H) 02/01/2017   ALT 25 02/01/2017   AST 24 02/01/2017   NA 139 02/01/2017   K 4.8 02/01/2017   CL 103 02/01/2017   CREATININE 1.33 02/01/2017   BUN 22 02/01/2017   CO2 28 02/01/2017   TSH 2.27 12/30/2015   PSA 0.00 (L) 02/01/2017     Assessment & Plan:   Problem List Items Addressed This Visit    Insomnia due to psychological stress    Chronic, with no improvement using over-the-counter first generation antihistamines. Alprazolam refilled.  The risks and benefits of benzodiazepine use were discussed with patient today including excessive sedation leading to respiratory depression,  impaired thinking/driving, and addiction.  Patient was advised to avoid concurrent use with alcohol, to use medication only as needed and not to share with others  .        Hyperlipidemia LDL goal <100 - Primary (Chronic)    Encouraged to try REd Yeast Rice given statin intolerance.      Relevant Medications   lisinopril (PRINIVIL,ZESTRIL) 10 MG tablet   Other Relevant Orders   Comprehensive metabolic panel   Lipid panel   Essential hypertension    Not at goal .  Increase lisinopril to 10 mg daily .        Relevant Medications   lisinopril (PRINIVIL,ZESTRIL) 10 MG tablet   Degenerative joint disease of knee    contineu daily use of glucosamine supplements and prn naproxen.          I have changed Caroll Rancher. Norrod's ALPRAZolam and lisinopril. I am also having him maintain his acetaminophen, naproxen, polycarbophil, Glucosamine-Chondroitin  (GLUCOSAMINE CHONDR COMPLEX PO), and aspirin.  Meds ordered this encounter  Medications  . ALPRAZolam (XANAX) 0.5 MG tablet    Sig: Take 1 tablet (0.5 mg total) by mouth at bedtime as needed. for sleep    Dispense:  30 tablet    Refill:  5  . lisinopril (PRINIVIL,ZESTRIL) 10 MG tablet    Sig: Take 0.5 tablets (5 mg total) by mouth daily.    Dispense:  30 tablet    Refill:  1    Medications Discontinued During This Encounter  Medication Reason  . ALPRAZolam (XANAX) 0.5 MG tablet Reorder  . lisinopril (PRINIVIL,ZESTRIL) 5 MG tablet     Follow-up: Return in about  6 months (around 02/02/2018) for CPE.   Crecencio Mc, MD

## 2017-08-03 NOTE — Assessment & Plan Note (Addendum)
Not at goal .  Increase lisinopril to 10 mg daily .

## 2017-08-03 NOTE — Assessment & Plan Note (Signed)
Encouraged to try REd Yeast Rice given statin intolerance.

## 2017-08-03 NOTE — Assessment & Plan Note (Signed)
Chronic, with no improvement using over-the-counter first generation antihistamines. Alprazolam refilled.  The risks and benefits of benzodiazepine use were discussed with patient today including excessive sedation leading to respiratory depression,  impaired thinking/driving, and addiction.  Patient was advised to avoid concurrent use with alcohol, to use medication only as needed and not to share with others  .

## 2017-08-03 NOTE — Assessment & Plan Note (Signed)
contineu daily use of glucosamine supplements and prn naproxen.

## 2017-09-20 ENCOUNTER — Other Ambulatory Visit (INDEPENDENT_AMBULATORY_CARE_PROVIDER_SITE_OTHER): Payer: Medicare Other

## 2017-09-20 DIAGNOSIS — E785 Hyperlipidemia, unspecified: Secondary | ICD-10-CM | POA: Diagnosis not present

## 2017-09-20 LAB — LIPID PANEL
CHOL/HDL RATIO: 5
Cholesterol: 297 mg/dL — ABNORMAL HIGH (ref 0–200)
HDL: 61.2 mg/dL (ref >39.00)
LDL Cholesterol: 216 mg/dL — ABNORMAL HIGH (ref 0–99)
NONHDL: 235.47
Triglycerides: 96 mg/dL (ref 0.0–149.0)
VLDL: 19.2 mg/dL (ref 0.0–40.0)

## 2017-09-20 LAB — COMPREHENSIVE METABOLIC PANEL
ALK PHOS: 73 U/L (ref 39–117)
ALT: 27 U/L (ref 0–53)
AST: 24 U/L (ref 0–37)
Albumin: 4 g/dL (ref 3.5–5.2)
BUN: 27 mg/dL — ABNORMAL HIGH (ref 6–23)
CHLORIDE: 105 meq/L (ref 96–112)
CO2: 24 meq/L (ref 19–32)
Calcium: 9.5 mg/dL (ref 8.4–10.5)
Creatinine, Ser: 1.4 mg/dL (ref 0.40–1.50)
GFR: 52.47 mL/min — AB (ref >60.00)
GLUCOSE: 107 mg/dL — AB (ref 70–99)
POTASSIUM: 4.5 meq/L (ref 3.5–5.1)
Sodium: 138 mEq/L (ref 135–145)
TOTAL PROTEIN: 6.8 g/dL (ref 6.0–8.3)
Total Bilirubin: 0.9 mg/dL (ref 0.2–1.2)

## 2017-09-22 ENCOUNTER — Other Ambulatory Visit: Payer: Self-pay | Admitting: Internal Medicine

## 2017-09-22 DIAGNOSIS — R944 Abnormal results of kidney function studies: Secondary | ICD-10-CM

## 2017-09-30 ENCOUNTER — Other Ambulatory Visit: Payer: Self-pay | Admitting: Internal Medicine

## 2017-09-30 ENCOUNTER — Encounter: Payer: Self-pay | Admitting: Internal Medicine

## 2017-09-30 DIAGNOSIS — I10 Essential (primary) hypertension: Secondary | ICD-10-CM

## 2017-09-30 MED ORDER — EZETIMIBE 10 MG PO TABS: 10.0000 mg | ORAL_TABLET | Freq: Every day | ORAL | 0 refills | Status: DC

## 2017-10-27 ENCOUNTER — Other Ambulatory Visit: Payer: Self-pay | Admitting: Internal Medicine

## 2017-11-01 ENCOUNTER — Telehealth: Payer: Self-pay | Admitting: Internal Medicine

## 2017-11-01 NOTE — Telephone Encounter (Signed)
Copied from [place] [phone]. Topic: Quick Communication - Rx Refill/Question >> Nov 01, 2017  4:17 PM Margot Ables wrote: Medication: pt states that the pharmacy has RX for lisinopril 10mg  1/2 tablet but pt states that he and Dr. Derrel Nip had discussed taking lisinopril 10mg  1 tablet per day - pt completely out and pharmacy will not refill because based on the RX on file it is too soon to get refilled. Please advise. Has the patient contacted their pharmacy? yes Preferred Pharmacy (with phone number or street name): [facility] [address], [place] [phone] (Phone) [phone] (Fax)

## 2017-11-02 MED ORDER — LISINOPRIL 10 MG PO TABS: 10.0000 mg | ORAL_TABLET | Freq: Every day | ORAL | 1 refills | Status: DC

## 2017-11-02 NOTE — Telephone Encounter (Signed)
Per Dr.Tullo last note lisinopril increased to 10 mg daily script sent to [facility].

## 2017-12-27 ENCOUNTER — Other Ambulatory Visit: Payer: Self-pay | Admitting: Internal Medicine

## 2018-01-07 ENCOUNTER — Ambulatory Visit (INDEPENDENT_AMBULATORY_CARE_PROVIDER_SITE_OTHER): Payer: Medicare Other

## 2018-01-07 DIAGNOSIS — Z23 Encounter for immunization: Secondary | ICD-10-CM

## 2018-01-31 DIAGNOSIS — L570 Actinic keratosis: Secondary | ICD-10-CM | POA: Diagnosis not present

## 2018-01-31 DIAGNOSIS — D2261 Melanocytic nevi of right upper limb, including shoulder: Secondary | ICD-10-CM | POA: Diagnosis not present

## 2018-01-31 DIAGNOSIS — Z85828 Personal history of other malignant neoplasm of skin: Secondary | ICD-10-CM | POA: Diagnosis not present

## 2018-01-31 DIAGNOSIS — D2262 Melanocytic nevi of left upper limb, including shoulder: Secondary | ICD-10-CM | POA: Diagnosis not present

## 2018-01-31 DIAGNOSIS — D2272 Melanocytic nevi of left lower limb, including hip: Secondary | ICD-10-CM | POA: Diagnosis not present

## 2018-01-31 DIAGNOSIS — D485 Neoplasm of uncertain behavior of skin: Secondary | ICD-10-CM | POA: Diagnosis not present

## 2018-01-31 DIAGNOSIS — C44719 Basal cell carcinoma of skin of left lower limb, including hip: Secondary | ICD-10-CM | POA: Diagnosis not present

## 2018-02-07 ENCOUNTER — Ambulatory Visit (INDEPENDENT_AMBULATORY_CARE_PROVIDER_SITE_OTHER): Payer: Medicare Other | Admitting: Internal Medicine

## 2018-02-07 ENCOUNTER — Encounter: Payer: Self-pay | Admitting: Internal Medicine

## 2018-02-07 VITALS — BP 138/68 | HR 47 | Temp 97.5°F | Resp 16 | Ht 72.0 in | Wt 207.2 lb

## 2018-02-07 DIAGNOSIS — E785 Hyperlipidemia, unspecified: Secondary | ICD-10-CM | POA: Diagnosis not present

## 2018-02-07 DIAGNOSIS — Z8546 Personal history of malignant neoplasm of prostate: Secondary | ICD-10-CM | POA: Diagnosis not present

## 2018-02-07 DIAGNOSIS — M130 Polyarthritis, unspecified: Secondary | ICD-10-CM

## 2018-02-07 DIAGNOSIS — D751 Secondary polycythemia: Secondary | ICD-10-CM | POA: Diagnosis not present

## 2018-02-07 DIAGNOSIS — I10 Essential (primary) hypertension: Secondary | ICD-10-CM

## 2018-02-07 DIAGNOSIS — Z0000 Encounter for general adult medical examination without abnormal findings: Secondary | ICD-10-CM

## 2018-02-07 DIAGNOSIS — Z23 Encounter for immunization: Secondary | ICD-10-CM | POA: Diagnosis not present

## 2018-02-07 LAB — COMPREHENSIVE METABOLIC PANEL
ALBUMIN: 4.2 g/dL (ref 3.5–5.2)
ALT: 27 U/L (ref 0–53)
AST: 22 U/L (ref 0–37)
Alkaline Phosphatase: 89 U/L (ref 39–117)
BUN: 22 mg/dL (ref 6–23)
CALCIUM: 9.6 mg/dL (ref 8.4–10.5)
CHLORIDE: 107 meq/L (ref 96–112)
CO2: 23 mEq/L (ref 19–32)
Creatinine, Ser: 1.31 mg/dL (ref 0.40–1.50)
GFR: 56.6 mL/min — ABNORMAL LOW (ref >60.00)
Glucose, Bld: 108 mg/dL — ABNORMAL HIGH (ref 70–99)
Potassium: 4.5 mEq/L (ref 3.5–5.1)
SODIUM: 140 meq/L (ref 135–145)
Total Bilirubin: 1.2 mg/dL (ref 0.2–1.2)
Total Protein: 6.7 g/dL (ref 6.0–8.3)

## 2018-02-07 LAB — CBC WITH DIFFERENTIAL/PLATELET
BASOS ABS: 0.1 10*3/uL (ref 0.0–0.1)
Basophils Relative: 0.7 % (ref 0.0–3.0)
EOS ABS: 0.2 10*3/uL (ref 0.0–0.7)
EOS PCT: 1.9 % (ref 0.0–5.0)
HCT: 51.2 % (ref 39.0–52.0)
HEMOGLOBIN: 17.5 g/dL — AB (ref 13.0–17.0)
Lymphocytes Relative: 7.9 % — ABNORMAL LOW (ref 12.0–46.0)
Lymphs Abs: 0.8 10*3/uL (ref 0.7–4.0)
MCHC: 34.1 g/dL (ref 30.0–36.0)
MCV: 91.3 fl (ref 78.0–100.0)
MONO ABS: 1.2 10*3/uL — AB (ref 0.1–1.0)
Monocytes Relative: 11.5 % (ref 3.0–12.0)
NEUTROS PCT: 78 % — AB (ref 43.0–77.0)
Neutro Abs: 8.1 10*3/uL — ABNORMAL HIGH (ref 1.4–7.7)
Platelets: 250 10*3/uL (ref 150.0–400.0)
RBC: 5.6 Mil/uL (ref 4.22–5.81)
RDW: 13.5 % (ref 11.5–15.5)
WBC: 10.4 10*3/uL (ref 4.0–10.5)

## 2018-02-07 LAB — LIPID PANEL
CHOLESTEROL: 207 mg/dL — AB (ref 0–200)
HDL: 57.9 mg/dL (ref >39.00)
LDL CALC: 129 mg/dL — AB (ref 0–99)
NonHDL: 149.07
TRIGLYCERIDES: 99 mg/dL (ref 0.0–149.0)
Total CHOL/HDL Ratio: 4
VLDL: 19.8 mg/dL (ref 0.0–40.0)

## 2018-02-07 LAB — SEDIMENTATION RATE: Sed Rate: 20 mm/hr (ref 0–20)

## 2018-02-07 LAB — TSH: TSH: 2.35 u[IU]/mL (ref 0.35–4.50)

## 2018-02-07 LAB — PSA, MEDICARE: PSA: 0 ng/ml — ABNORMAL LOW (ref 0.10–4.00)

## 2018-02-07 MED ORDER — ZOSTER VAC RECOMB ADJUVANTED 50 MCG/0.5ML IM SUSR: 0.5000 mL | Freq: Once | INTRAMUSCULAR | 1 refills | Status: AC

## 2018-02-07 NOTE — Patient Instructions (Addendum)
You received your final pneumonia vaccine today  The ShingRx vaccine is now available in local pharmacies and is much more protective thant Zostavaxs,  It is therefore ADVISED for all interested adults over 50 to prevent shingles .  I have given you a prescription for future use  Your next colonoscopy is due in 2022. If you would like to consider an alternative, consider Cologuard.  This screening test for colon cancer is very accurate in patients who are at average risk for colon cancer, if  repeated  every 3 years, .  We can continue to use if until you are 84 for  colon CA screening, unless you develop a change in bowel function or a family history of colon Cancer.  These developments would  change you from being at average risk to increased risk for colon cancer and would require colonoscopy for ongoing screening .   You are not sick.  You are dealing with allergic rhinitis.  For your allergies ,  You can use Benadryl 25 mg at bedtime (or 30 minutes Prior) but you should also  add one of the newer second generation antihistamines that are longer acting, non sedating and  available OTC:  Generic  Zyrtec, which is cetirizine;   generic Allegra , available generically as fexofenadine ; comes in 60 mg and 180 mg once daily strengths; Or Generic Claritin :  also available as loratidine    Happy Thanksgiving!!!    Health Maintenance, Male A healthy lifestyle and preventive care is important for your health and wellness. Ask your health care provider about what schedule of regular examinations is right for you. What should I know about weight and diet? Eat a Healthy Diet  Eat plenty of vegetables, fruits, whole grains, low-fat dairy products, and lean protein.  Do not eat a lot of foods high in solid fats, added sugars, or salt.  Maintain a Healthy Weight Regular exercise can help you achieve or maintain a healthy weight. You should:  Do at least 150 minutes of exercise each week. The  exercise should increase your heart rate and make you sweat (moderate-intensity exercise).  Do strength-training exercises at least twice a week.  Watch Your Levels of Cholesterol and Blood Lipids  Have your blood tested for lipids and cholesterol every 5 years starting at 75 years of age. If you are at high risk for heart disease, you should start having your blood tested when you are 75 years old. You may need to have your cholesterol levels checked more often if: ? Your lipid or cholesterol levels are high. ? You are older than 75 years of age. ? You are at high risk for heart disease.  What should I know about cancer screening? Many types of cancers can be detected early and may often be prevented. Lung Cancer  You should be screened every year for lung cancer if: ? You are a current smoker who has smoked for at least 30 years. ? You are a former smoker who has quit within the past 15 years.  Talk to your health care provider about your screening options, when you should start screening, and how often you should be screened.  Colorectal Cancer  Routine colorectal cancer screening usually begins at 75 years of age and should be repeated every 5-10 years until you are 75 years old. You may need to be screened more often if early forms of precancerous polyps or small growths are found. Your health care provider may recommend screening  at an earlier age if you have risk factors for colon cancer.  Your health care provider may recommend using home test kits to check for hidden blood in the stool.  A small camera at the end of a tube can be used to examine your colon (sigmoidoscopy or colonoscopy). This checks for the earliest forms of colorectal cancer.  Prostate and Testicular Cancer  Depending on your age and overall health, your health care provider may do certain tests to screen for prostate and testicular cancer.  Talk to your health care provider about any symptoms or concerns  you have about testicular or prostate cancer.  Skin Cancer  Check your skin from head to toe regularly.  Tell your health care provider about any new moles or changes in moles, especially if: ? There is a change in a mole's size, shape, or color. ? You have a mole that is larger than a pencil eraser.  Always use sunscreen. Apply sunscreen liberally and repeat throughout the day.  Protect yourself by wearing long sleeves, pants, a wide-brimmed hat, and sunglasses when outside.  What should I know about heart disease, diabetes, and high blood pressure?  If you are 26-75 years of age, have your blood pressure checked every 3-5 years. If you are 38 years of age or older, have your blood pressure checked every year. You should have your blood pressure measured twice-once when you are at a hospital or clinic, and once when you are not at a hospital or clinic. Record the average of the two measurements. To check your blood pressure when you are not at a hospital or clinic, you can use: ? An automated blood pressure machine at a pharmacy. ? A home blood pressure monitor.  Talk to your health care provider about your target blood pressure.  If you are between 31-75 years old, ask your health care provider if you should take aspirin to prevent heart disease.  Have regular diabetes screenings by checking your fasting blood sugar level. ? If you are at a normal weight and have a low risk for diabetes, have this test once every three years after the age of 75. ? If you are overweight and have a high risk for diabetes, consider being tested at a younger age or more often.  A one-time screening for abdominal aortic aneurysm (AAA) by ultrasound is recommended for men aged 30-75 years who are current or former smokers. What should I know about preventing infection? Hepatitis B If you have a higher risk for hepatitis B, you should be screened for this virus. Talk with your health care provider to find  out if you are at risk for hepatitis B infection. Hepatitis C Blood testing is recommended for:  Everyone born from [birth year] through 1963.  Anyone with known risk factors for hepatitis C.  Sexually Transmitted Diseases (STDs)  You should be screened each year for STDs including gonorrhea and chlamydia if: ? You are sexually active and are younger than 75 years of age. ? You are older than 75 years of age and your health care provider tells you that you are at risk for this type of infection. ? Your sexual activity has changed since you were last screened and you are at an increased risk for chlamydia or gonorrhea. Ask your health care provider if you are at risk.  Talk with your health care provider about whether you are at high risk of being infected with HIV. Your health care provider may recommend a  prescription medicine to help prevent HIV infection.  What else can I do?  Schedule regular health, dental, and eye exams.  Stay current with your vaccines (immunizations).  Do not use any tobacco products, such as cigarettes, chewing tobacco, and e-cigarettes. If you need help quitting, ask your health care provider.  Limit alcohol intake to no more than 2 drinks per day. One drink equals 12 ounces of beer, 5 ounces of wine, or 1 ounces of hard liquor.  Do not use street drugs.  Do not share needles.  Ask your health care provider for help if you need support or information about quitting drugs.  Tell your health care provider if you often feel depressed.  Tell your health care provider if you have ever been abused or do not feel safe at home. This information is not intended to replace advice given to you by your health care provider. Make sure you discuss any questions you have with your health care provider. Document Released: 08/29/2007 Document Revised: 10/30/2015 Document Reviewed: 12/04/2014 Elsevier Interactive Patient Education  [employer].

## 2018-02-07 NOTE — Progress Notes (Signed)
Patient ID: Bobby Murillo, male    DOB: [birth year]  Age: 77 y.o. [name]: [id number]  The patient is here for annual preventive examination and management of other chronic and acute problems.   The risk factors are reflected in the social history.  The roster of all physicians providing medical care to patient - is listed in the Snapshot section of the chart.  Activities of daily living:  The patient is 100% independent in all ADLs: dressing, toileting, feeding as well as independent mobility  Home safety : The patient has smoke detectors in the home. They wear seatbelts.  There are no firearms at home. There is no violence in the home.   There is no risks for hepatitis, STDs or HIV. There is no   history of blood transfusion. They have no travel history to infectious disease endemic areas of the world.  The patient has seen their dentist in the last six month. They have seen their eye doctor in the last year. They admit to slight hearing difficulty with regard to whispered voices and some television programs.  They have deferred audiologic testing in the last year.  They do not  have excessive sun exposure. Discussed the need for sun protection: hats, long sleeves and use of sunscreen if there is significant sun exposure.   Diet: the importance of a healthy diet is discussed. They do have a healthy diet.  The benefits of regular aerobic exercise were discussed. Exercising daily. Plays doubles tennis and  Jogs    Depression screen: there are no signs or vegative symptoms of depression- irritability, change in appetite, anhedonia, sadness/tearfullness.  Cognitive assessment: the patient manages all their financial and personal affairs and is actively engaged. They could relate day,date,year and events; recalled 2/3 objects at 3 minutes; performed clock-face test normally.  The following portions of the patient's history were reviewed and updated as appropriate: allergies, current medications, past  family history, past medical history,  past surgical history, past social history  and problem list.  Visual acuity was not assessed per patient preference since he has regular follow up with her ophthalmologist. Hearing and body mass index were assessed and reviewed.   During the course of the visit the patient was educated and counseled about appropriate screening and preventive services including : fall prevention , diabetes screening, nutrition counseling, colorectal cancer screening, and recommended immunizations.    CC: The primary encounter diagnosis was Essential hypertension. Diagnoses of History of prostate cancer, Hyperlipidemia LDL goal <100, Polyarthritis, Need for 23-polyvalent pneumococcal polysaccharide vaccine, and Encounter for preventive health examination were also pertinent to this visit.  1)  Tolerating zetia? Yes   2) URI symptoms for the past week  Worse yesterday, just a cough,  No facial pain , some rhinitis .  3) Joint pain:  taking naproxen every other day for joint pain .  Still playing tennis   1) pain in [place] left foot. Marland Kitchen Has been  Imaged by [facility]  6 months ago.   .  Was given motrin 833m  Tid for one week which worked well. 2) left knee  Keeping him  from running .  Anterior pain   Has been wearing a mesh band just below the patella which she states has helped  . Doesn't want to see ortho yet.  Using glucosamine ;  rec'd fish oil 3) left hip  Chronic,  Right hip started hurting after tennis doubles yesterday,  Not hurting today   4) insomnia  using 0.25 mg alprazolam.  Prescribed yu the deceased Dr Bobby Murillo prior to 05-14-04.    FH of AD in mother and sister at early age.     History Court has a past medical history of Abnormal LFTs, Anxiety, Basal cell carcinoma (July 2012), Hyperlipidemia, prostate cancer 05/14/2008), and Sinus bradycardia by electrocardiogram.   He has a past surgical history that includes Prostatectomy (11/2008).   His family history  includes Alzheimer's disease in his mother and sister; Heart disease (age of onset: 30) in his paternal uncle; Stroke in his father and paternal aunt.He reports that he has never smoked. He has never used smokeless tobacco. He reports that he drinks about 14.0 standard drinks of alcohol per week. He reports that he does not use drugs.  Outpatient Medications Prior to Visit  Medication Sig Dispense Refill  . acetaminophen (TYLENOL) 325 MG tablet Take 650 mg by mouth every 6 (six) hours as needed.      . ALPRAZolam (XANAX) 0.5 MG tablet Take 1 tablet (0.5 mg total) by mouth at bedtime as needed. for sleep 30 tablet 5  . aspirin 81 MG tablet Take 1 tablet by mouth daily. 1 tab by mouth daily    . ezetimibe (ZETIA) 10 MG tablet TAKE 1 TABLET BY MOUTH ONCE DAILY 90 tablet 0  . Glucosamine-Chondroitin (GLUCOSAMINE CHONDR COMPLEX PO) Take 1 tablet by mouth daily. tab by mouth daily    . lisinopril (PRINIVIL,ZESTRIL) 10 MG tablet Take 1 tablet (10 mg total) by mouth daily. 90 tablet 1  . naproxen (NAPROSYN) 250 MG tablet Take 250 mg by mouth as needed.      . polycarbophil (FIBERCON) 625 MG tablet Take 625 mg by mouth daily. Reported on 05/29/2015     No facility-administered medications prior to visit.     Review of Systems   Patient denies headache, fevers, malaise, unintentional weight loss, skin rash, eye pain, sinus congestion and sinus pain, sore throat, dysphagia,  hemoptysis , cough, dyspnea, wheezing, chest pain, palpitations, orthopnea, edema, abdominal pain, nausea, melena, diarrhea, constipation, flank pain, dysuria, hematuria, urinary  Frequency, nocturia, numbness, tingling, seizures,  Focal weakness, Loss of consciousness,  Tremor, insomnia, depression, anxiety, and suicidal ideation.      Objective:  BP 138/68 (BP Location: Left Arm, Patient Position: Sitting, Cuff Size: Normal)   Pulse (!) 47   Temp (!) 97.5 F (36.4 C) (Oral)   Resp 16   Ht 6' (1.829 m)   Wt 207 lb 3.2 oz (94  kg)   SpO2 96%   BMI 28.10 kg/m   Physical Exam   General appearance: alert, cooperative and appears stated age Ears: normal TM's and external ear canals both ears Throat: lips, mucosa, and tongue normal; teeth and gums normal Neck: no adenopathy, no carotid bruit, supple, symmetrical, trachea midline and thyroid not enlarged, symmetric, no tenderness/mass/nodules Back: symmetric, no curvature. ROM normal. No CVA tenderness. Lungs: clear to auscultation bilaterally Heart: regular rate and rhythm, S1, S2 normal, no murmur, click, rub or gallop Abdomen: soft, non-tender; bowel sounds normal; no masses,  no organomegaly Pulses: 2+ and symmetric Skin: Skin color, texture, turgor normal. No rashes or lesions Lymph nodes: Cervical, supraclavicular, and axillary nodes normal.    Assessment & Plan:   Problem List Items Addressed This Visit    Encounter for preventive health examination    age appropriate education and counseling updated, referrals for preventative services and immunizations addressed, dietary and smoking counseling addressed, most recent labs reviewed.  I  have personally reviewed and have noted:  1) the patient's medical and social history 2) The pt's use of alcohol, tobacco, and illicit drugs 3) The patient's current medications and supplements 4) Functional ability including ADL's, fall risk, home safety risk, hearing and visual impairment 5) Diet and physical activities 6) Evidence for depression or mood disorder 7) The patient's height, weight, and BMI have been recorded in the chart  I have made referrals, and provided counseling and education based on review of the above      Essential hypertension - Primary    Improved control with increase in lisinopril to 10 mg daily .        Relevant Orders   Comprehensive metabolic panel (Completed)   History of prostate cancer    PSA was undetectable for years, followed by a rise, which was treated with XRT .   Annual PSA is not detectable    Lab Results  Component Value Date   PSA 0.00 (L) 02/07/2018   PSA 0.00 (L) 02/01/2017   PSA 0.00 (L) 12/30/2015         Relevant Orders   PSA, Medicare (Completed)   Hyperlipidemia LDL goal <100 (Chronic)    Tolerating Zetia.LDL lowered by nearly 100 pts. LFTs normal.  Statin intolerant/   Lab Results  Component Value Date   CHOL 207 (H) 02/07/2018   HDL 57.90 02/07/2018   LDLCALC 129 (H) 02/07/2018   LDLDIRECT 184.0 12/30/2015   TRIG 99.0 02/07/2018   CHOLHDL 4 02/07/2018   Lab Results  Component Value Date   ALT 27 02/07/2018   AST 22 02/07/2018   ALKPHOS 89 02/07/2018   BILITOT 1.2 02/07/2018         Relevant Orders   Lipid panel (Completed)   Polyarthritis    Multiple joints affected by DJD .  Checking ESR to rule out inflammatory states. ESR normal.  Continue prn aleve.   Lab Results  Component Value Date   ESRSEDRATE 20 02/07/2018         Relevant Orders   Sedimentation rate (Completed)   CBC with Differential/Platelet (Completed)   TSH (Completed)    Other Visit Diagnoses    Need for 23-polyvalent pneumococcal polysaccharide vaccine       Relevant Orders   Pneumococcal polysaccharide vaccine 23-valent greater than or equal to 2yo subcutaneous/IM (Completed)      I am having Bobby Murillo. Bobby Murillo start on Zoster Vaccine Adjuvanted. I am also having him maintain his acetaminophen, naproxen, polycarbophil, Glucosamine-Chondroitin (GLUCOSAMINE CHONDR COMPLEX PO), aspirin, ALPRAZolam, lisinopril, and ezetimibe.  Meds ordered this encounter  Medications  . Zoster Vaccine Adjuvanted [facility]) injection    Sig: Inject 0.5 mLs into the muscle once for 1 dose.    Dispense:  1 each    Refill:  1    There are no discontinued medications.  Follow-up: No follow-ups on file.   Crecencio Mc, MD

## 2018-02-08 DIAGNOSIS — D751 Secondary polycythemia: Secondary | ICD-10-CM | POA: Insufficient documentation

## 2018-02-08 DIAGNOSIS — M2550 Pain in unspecified joint: Secondary | ICD-10-CM | POA: Insufficient documentation

## 2018-02-08 DIAGNOSIS — M130 Polyarthritis, unspecified: Secondary | ICD-10-CM | POA: Insufficient documentation

## 2018-02-08 NOTE — Assessment & Plan Note (Addendum)
Tolerating Zetia.LDL lowered by nearly 100 pts. LFTs normal.  Statin intolerant/   Lab Results  Component Value Date   CHOL 207 (H) 02/07/2018   HDL 57.90 02/07/2018   LDLCALC 129 (H) 02/07/2018   LDLDIRECT 184.0 12/30/2015   TRIG 99.0 02/07/2018   CHOLHDL 4 02/07/2018   Lab Results  Component Value Date   ALT 27 02/07/2018   AST 22 02/07/2018   ALKPHOS 89 02/07/2018   BILITOT 1.2 02/07/2018

## 2018-02-08 NOTE — Assessment & Plan Note (Signed)

## 2018-02-08 NOTE — Assessment & Plan Note (Signed)
Multiple joints affected by DJD .  Checking ESR to rule out inflammatory states. ESR normal.  Continue prn aleve.   Lab Results  Component Value Date   ESRSEDRATE 20 02/07/2018

## 2018-02-08 NOTE — Assessment & Plan Note (Signed)
Improved control with increase in lisinopril to 10 mg daily .

## 2018-02-08 NOTE — Assessment & Plan Note (Signed)
PSA was undetectable for years, followed by a rise, which was treated with XRT .  Annual PSA is not detectable    Lab Results  Component Value Date   PSA 0.00 (L) 02/07/2018   PSA 0.00 (L) 02/01/2017   PSA 0.00 (L) 12/30/2015

## 2018-02-08 NOTE — Assessment & Plan Note (Signed)
Referral to hematology recommended

## 2018-02-09 ENCOUNTER — Other Ambulatory Visit: Payer: Self-pay | Admitting: Internal Medicine

## 2018-02-09 DIAGNOSIS — D751 Secondary polycythemia: Secondary | ICD-10-CM

## 2018-02-09 DIAGNOSIS — M25551 Pain in right hip: Principal | ICD-10-CM

## 2018-02-09 DIAGNOSIS — G8929 Other chronic pain: Secondary | ICD-10-CM

## 2018-02-09 MED ORDER — TRAMADOL HCL 50 MG PO TABS: 50.0000 mg | ORAL_TABLET | Freq: Four times a day (QID) | ORAL | 0 refills | Status: DC | PRN

## 2018-02-09 NOTE — Assessment & Plan Note (Signed)
Recurrent,  At tiems severe,  Continue naproxen,  Add tramadol,  Ortho consult

## 2018-02-09 NOTE — Progress Notes (Signed)
amb re 

## 2018-02-21 ENCOUNTER — Inpatient Hospital Stay: Payer: Medicare Other

## 2018-02-21 ENCOUNTER — Other Ambulatory Visit: Payer: Self-pay

## 2018-02-21 ENCOUNTER — Encounter: Payer: Self-pay | Admitting: Oncology

## 2018-02-21 ENCOUNTER — Inpatient Hospital Stay: Payer: Medicare Other | Attending: Oncology | Admitting: Oncology

## 2018-02-21 VITALS — BP 129/70 | HR 58 | Temp 96.7°F | Resp 18 | Ht 73.0 in | Wt 206.0 lb

## 2018-02-21 DIAGNOSIS — Z7289 Other problems related to lifestyle: Secondary | ICD-10-CM | POA: Diagnosis not present

## 2018-02-21 DIAGNOSIS — D751 Secondary polycythemia: Secondary | ICD-10-CM

## 2018-02-21 DIAGNOSIS — Z8546 Personal history of malignant neoplasm of prostate: Secondary | ICD-10-CM

## 2018-02-21 LAB — CBC WITH DIFFERENTIAL/PLATELET
ABS IMMATURE GRANULOCYTES: 0.03 10*3/uL (ref 0.00–0.07)
Basophils Absolute: 0 10*3/uL (ref 0.0–0.1)
Basophils Relative: 1 %
Eosinophils Absolute: 0.1 10*3/uL (ref 0.0–0.5)
Eosinophils Relative: 2 %
HCT: 49.9 % (ref 39.0–52.0)
Hemoglobin: 16.7 g/dL (ref 13.0–17.0)
Immature Granulocytes: 1 %
LYMPHS ABS: 0.9 10*3/uL (ref 0.7–4.0)
Lymphocytes Relative: 16 %
MCH: 29.9 pg (ref 26.0–34.0)
MCHC: 33.5 g/dL (ref 30.0–36.0)
MCV: 89.3 fL (ref 80.0–100.0)
Monocytes Absolute: 0.7 10*3/uL (ref 0.1–1.0)
Monocytes Relative: 12 %
Neutro Abs: 4.1 10*3/uL (ref 1.7–7.7)
Neutrophils Relative %: 68 %
Platelets: 270 10*3/uL (ref 150–400)
RBC: 5.59 MIL/uL (ref 4.22–5.81)
RDW: 12.4 % (ref 11.5–15.5)
WBC: 6 10*3/uL (ref 4.0–10.5)
nRBC: 0 % (ref 0.0–0.2)

## 2018-02-21 LAB — RETIC PANEL
Immature Retic Fract: 5.2 % (ref 2.3–15.9)
RBC.: 5.59 MIL/uL (ref 4.22–5.81)
RETIC COUNT ABSOLUTE: 69.9 10*3/uL (ref 19.0–186.0)
RETIC CT PCT: 1.3 % (ref 0.4–3.1)
Reticulocyte Hemoglobin: 35 pg (ref >27.9)

## 2018-02-21 LAB — TECHNOLOGIST SMEAR REVIEW

## 2018-02-21 NOTE — Progress Notes (Signed)
Patient here for initial visit. °

## 2018-02-21 NOTE — Progress Notes (Signed)
Hematology/Oncology Consult note [facility] Telephone:([phone] [phone]   Patient Care Team: Crecencio Mc, MD as PCP - General (Internal Medicine)  REFERRING PROVIDER: Crecencio Mc, MD  CHIEF COMPLAINTS/REASON FOR VISIT:  Evaluation of polycytosis  HISTORY OF PRESENTING ILLNESS:  Bobby Murillo is a  77 y.o.  male with PMH listed below who was referred to me for evaluation of polycytosis/erythrocytosis Reviewed patient's recent lab work which was obtain by PCP.  Labs showed elevated hemoglobin at 17.5,  total white count 10.4, platelet counts 250,000. Differential showed neutrophils 78%.  ANC 8.1.  Monocyte 1.2.  Patient also reports upper respiratory infection symptoms 1 week before labs being checked.  Symptoms improved and now. Chronic Onset,  Patient has been having intermittent mild erythrocytosis ranging from 17-17.2 since 2015. No aggravating or alleviating factors.  Reviewed patient's previous lab works.   Associated signs or symptoms: Denies weight loss, fever, chills, fatigue, night sweats.   Context:  Smoking history: Denies Testosterone supplements: Denies History of blood clots: Denies Daytime somnolence: Denies Family history of polycythemia: Denies  Has some chronic pains and aches all over and he attributes to his age. History of prostate cancer in 2010, status post surgery at [facility] and XRT.  Annual PSA is not detectable.  Review of Systems  Constitutional: Negative for appetite change, chills, fatigue, fever and unexpected weight change.  HENT:   Negative for hearing loss and voice change.   Eyes: Negative for eye problems and icterus.  Respiratory: Negative for chest tightness, cough and shortness of breath.   Cardiovascular: Negative for chest pain and leg swelling.  Gastrointestinal: Negative for abdominal distention and abdominal pain.  Endocrine: Negative for hot flashes.  Genitourinary: Negative for difficulty  urinating, dysuria and frequency.   Musculoskeletal: Negative for arthralgias.  Skin: Negative for itching and rash.  Neurological: Negative for light-headedness and numbness.  Hematological: Negative for adenopathy. Does not bruise/bleed easily.  Psychiatric/Behavioral: Negative for confusion.    MEDICAL HISTORY:  Past Medical History:  Diagnosis Date  . Abnormal LFTs   . Anxiety   . Basal cell carcinoma July 2012   left calf   . Hyperlipidemia   . prostate cancer 2010   Dr. Ronny Bacon  . Sinus bradycardia by electrocardiogram     SURGICAL HISTORY: Past Surgical History:  Procedure Laterality Date  . PROSTATECTOMY  11/2008   transurethral, radical, Dr. Darcus Austin    SOCIAL HISTORY: Social History   Socioeconomic History  . Marital status: Married    Spouse name: Not on file  . Number of children: Not on file  . Years of education: Not on file  . Highest education level: Not on file  Occupational History  . Not on file  Social Needs  . Financial resource strain: Not on file  . Food insecurity:    Worry: Not on file    Inability: Not on file  . Transportation needs:    Medical: Not on file    Non-medical: Not on file  Tobacco Use  . Smoking status: Never Smoker  . Smokeless tobacco: Never Used  Substance and Sexual Activity  . Alcohol use: Yes    Alcohol/week: 14.0 standard drinks    Types: 7 Glasses of wine, 7 Shots of liquor per week    Comment: occasional  . Drug use: No  . Sexual activity: Yes  Lifestyle  . Physical activity:    Days per week: Not on file    Minutes per session:  Not on file  . Stress: Not on file  Relationships  . Social connections:    Talks on phone: Not on file    Gets together: Not on file    Attends religious service: Not on file    Active member of club or organization: Not on file    Attends meetings of clubs or organizations: Not on file    Relationship status: Not on file  . Intimate partner violence:    Fear of current or ex  partner: Not on file    Emotionally abused: Not on file    Physically abused: Not on file    Forced sexual activity: Not on file  Other Topics Concern  . Not on file  Social History Narrative  . Not on file    FAMILY HISTORY: Family History  Problem Relation Age of Onset  . Alzheimer's disease Mother   . Stroke Father   . Prostate cancer Father   . Alzheimer's disease Sister   . Stroke Paternal Aunt   . Heart disease Paternal Uncle 63    ALLERGIES:  is allergic to alfuzosin and pravastatin sodium.  MEDICATIONS:  Current Outpatient Medications  Medication Sig Dispense Refill  . acetaminophen (TYLENOL) 325 MG tablet Take 650 mg by mouth every 6 (six) hours as needed.      . ALPRAZolam (XANAX) 0.5 MG tablet Take 1 tablet (0.5 mg total) by mouth at bedtime as needed. for sleep 30 tablet 5  . aspirin 81 MG tablet Take 1 tablet by mouth daily. 1 tab by mouth daily    . ezetimibe (ZETIA) 10 MG tablet TAKE 1 TABLET BY MOUTH ONCE DAILY 90 tablet 0  . Glucosamine-Chondroitin (GLUCOSAMINE CHONDR COMPLEX PO) Take 1 tablet by mouth daily. tab by mouth daily    . lisinopril (PRINIVIL,ZESTRIL) 10 MG tablet Take 1 tablet (10 mg total) by mouth daily. 90 tablet 1  . naproxen (NAPROSYN) 250 MG tablet Take 250 mg by mouth as needed.      . polycarbophil (FIBERCON) 625 MG tablet Take 625 mg by mouth daily. Reported on 05/29/2015    . traMADol (ULTRAM) 50 MG tablet Take 1 tablet (50 mg total) by mouth every 6 (six) hours as needed. 30 tablet 0   No current facility-administered medications for this visit.      PHYSICAL EXAMINATION: ECOG PERFORMANCE STATUS: 0 - Asymptomatic Vitals:   02/21/18 1100  BP: 129/70  Pulse: (!) 58  Resp: 18  Temp: (!) 96.7 F (35.9 C)   Filed Weights   02/21/18 1100  Weight: 206 lb (93.4 kg)    Physical Exam  Constitutional: He is oriented to person, place, and time. No distress.  HENT:  Head: Normocephalic and atraumatic.  Mouth/Throat: Oropharynx is  clear and moist.  Eyes: Pupils are equal, round, and reactive to light. EOM are normal. No scleral icterus.  Neck: Normal range of motion. Neck supple.  Cardiovascular: Normal rate, regular rhythm and normal heart sounds.  Pulmonary/Chest: Effort normal. No respiratory distress. He has no wheezes.  Abdominal: Soft. Bowel sounds are normal. He exhibits no distension and no mass. There is no tenderness.  Musculoskeletal: Normal range of motion. He exhibits no edema or deformity.  Neurological: He is alert and oriented to person, place, and time. No cranial nerve deficit. Coordination normal.  Skin: Skin is warm and dry. No rash noted. No erythema.  Psychiatric: He has a normal mood and affect. His behavior is normal. Thought content normal.  LABORATORY DATA:  I have reviewed the data as listed Lab Results  Component Value Date   WBC 6.0 02/21/2018   HGB 16.7 02/21/2018   HCT 49.9 02/21/2018   MCV 89.3 02/21/2018   PLT 270 02/21/2018   Recent Labs    09/20/17 0835 02/07/18 1032  NA 138 140  K 4.5 4.5  CL 105 107  CO2 24 23  GLUCOSE 107* 108*  BUN 27* 22  CREATININE 1.40 1.31  CALCIUM 9.5 9.6  PROT 6.8 6.7  ALBUMIN 4.0 4.2  AST 24 22  ALT 27 27  ALKPHOS 73 89  BILITOT 0.9 1.2   Iron/TIBC/Ferritin/ %Sat No results found for: IRON, TIBC, FERRITIN, IRONPCTSAT      ASSESSMENT & PLAN:  1. Erythrocytosis   2. History of prostate cancer    Labs are reviewed and discussed with patient. Polycythemia (erythrocytosis) is an abnormal elevation of hemoglobin (Hgb) and/or hematocrit (Hct) in peripheral blood, and this can be caused by primary etiology, ie bone marrow mutation, or secondary etiology, ie hypoxia, smoking, androgen supplements, etc.  I will obtain erythropoietin, carbo monoxide level, rule out primary etiology, JAK2 with reflex to other mutations If primary etiology was ruled out, mostly likely need sleep apnea to rule out secondary course. History of prostate  cancer, annual PSA undetectable.  Continue monitor. Orders Placed This Encounter  Procedures  . CBC with Differential/Platelet    Standing Status:   Future    Number of Occurrences:   1    Standing Expiration Date:   02/22/2019  . Erythropoietin    Standing Status:   Future    Number of Occurrences:   1    Standing Expiration Date:   02/22/2019  . Carbon monoxide, blood (performed at ref lab)    Standing Status:   Future    Number of Occurrences:   1    Standing Expiration Date:   02/21/2019  . Retic Panel    Standing Status:   Future    Number of Occurrences:   1    Standing Expiration Date:   02/22/2019  . JAK2 V617F, w Reflex to CALR/E12/MPL    Standing Status:   Future    Number of Occurrences:   1    Standing Expiration Date:   02/22/2019  . Technologist smear review    Standing Status:   Future    Number of Occurrences:   1    Standing Expiration Date:   02/22/2019    We spent sufficient time to discuss many aspect of care, questions were answered to patient's satisfaction. The patient knows to call the clinic with any problems questions or concerns.  Return of visit: 4 weeks Thank you for this kind referral and the opportunity to participate in the care of this patient. A copy of today's note is routed to referring provider  Total face to face encounter time for this patient visit was 45 min. >50% of the time was  spent in counseling and coordination of care.    Earlie Server, MD, PhD Hematology Oncology [facility] Pager- [phone] 02/21/2018

## 2018-02-22 LAB — CARBON MONOXIDE, BLOOD (PERFORMED AT REF LAB): CARBON MONOXIDE, BLOOD: 2.7 % (ref 0.0–3.6)

## 2018-02-22 LAB — ERYTHROPOIETIN: Erythropoietin: 7.4 m[IU]/mL (ref 2.6–18.5)

## 2018-03-04 LAB — JAK2 V617F, W REFLEX TO CALR/E12/MPL

## 2018-03-04 LAB — CALR + JAK2 E12-15 + MPL (REFLEXED)

## 2018-03-21 ENCOUNTER — Other Ambulatory Visit: Payer: Self-pay

## 2018-03-21 ENCOUNTER — Encounter: Payer: Self-pay | Admitting: Oncology

## 2018-03-21 ENCOUNTER — Inpatient Hospital Stay: Payer: Medicare Other | Attending: Oncology | Admitting: Oncology

## 2018-03-21 VITALS — BP 133/77 | HR 78 | Temp 96.0°F | Resp 18 | Wt 207.4 lb

## 2018-03-21 DIAGNOSIS — D751 Secondary polycythemia: Secondary | ICD-10-CM

## 2018-03-21 DIAGNOSIS — Z8546 Personal history of malignant neoplasm of prostate: Secondary | ICD-10-CM

## 2018-03-21 NOTE — Progress Notes (Signed)
Hematology/Oncology Consult note [facility] Telephone:([phone] Fax:([phone]   Patient Care Team: Crecencio Mc, MD as PCP - General (Internal Medicine)  REFERRING PROVIDER: Crecencio Mc, MD  CHIEF COMPLAINTS/REASON FOR VISIT:  Evaluation of polycytosis  HISTORY OF PRESENTING ILLNESS:  Bobby Murillo is a  78 y.o.  male with PMH listed below who was referred to me for evaluation of polycytosis/erythrocytosis Reviewed patient's recent lab work which was obtain by PCP.  Labs showed elevated hemoglobin at 17.5,  total white count 10.4, platelet counts 250,000. Differential showed neutrophils 78%.  ANC 8.1.  Monocyte 1.2.  Patient also reports upper respiratory infection symptoms 1 week before labs being checked.  Symptoms improved and now. Chronic Onset,  Patient has been having intermittent mild erythrocytosis ranging from 17-17.2 since 2015. No aggravating or alleviating factors.  Reviewed patient's previous lab works.   Associated signs or symptoms: Denies weight loss, fever, chills, fatigue, night sweats.   Context:  Smoking history: Denies Testosterone supplements: Denies History of blood clots: Denies Daytime somnolence: Denies Family history of polycythemia: Denies  Has some chronic pains and aches all over and he attributes to his age. History of prostate cancer in 2010, status post surgery at [facility] and XRT.  Annual PSA is not detectable.  INTERVAL HISTORY Bobby Murillo is a 78 y.o. male who has above history reviewed by me today presents for follow up visit for management of poly-cytosis. During the interval patient has had lab work-up done and present to discuss work-up results. Denies any headache, dizziness, blurry vision.  Feeling well at baseline.  No new complaints.   Review of Systems  Constitutional: Negative for appetite change, chills, fatigue, fever and unexpected weight change.  HENT:   Negative for hearing loss and  voice change.   Eyes: Negative for eye problems and icterus.  Respiratory: Negative for chest tightness, cough and shortness of breath.   Cardiovascular: Negative for chest pain and leg swelling.  Gastrointestinal: Negative for abdominal distention and abdominal pain.  Endocrine: Negative for hot flashes.  Genitourinary: Negative for difficulty urinating, dysuria and frequency.   Musculoskeletal: Negative for arthralgias.  Skin: Negative for itching and rash.  Neurological: Negative for light-headedness and numbness.  Hematological: Negative for adenopathy. Does not bruise/bleed easily.  Psychiatric/Behavioral: Negative for confusion.    MEDICAL HISTORY:  Past Medical History:  Diagnosis Date  . Abnormal LFTs   . Anxiety   . Basal cell carcinoma July 2012   left calf   . Hyperlipidemia   . prostate cancer 2010   Dr. Ronny Bacon  . Sinus bradycardia by electrocardiogram     SURGICAL HISTORY: Past Surgical History:  Procedure Laterality Date  . PROSTATECTOMY  11/2008   transurethral, radical, Dr. Darcus Austin    SOCIAL HISTORY: Social History   Socioeconomic History  . Marital status: Married    Spouse name: Not on file  . Number of children: Not on file  . Years of education: Not on file  . Highest education level: Not on file  Occupational History  . Not on file  Social Needs  . Financial resource strain: Not on file  . Food insecurity:    Worry: Not on file    Inability: Not on file  . Transportation needs:    Medical: Not on file    Non-medical: Not on file  Tobacco Use  . Smoking status: Never Smoker  . Smokeless tobacco: Never Used  Substance and Sexual Activity  . Alcohol  use: Yes    Alcohol/week: 14.0 standard drinks    Types: 7 Glasses of wine, 7 Shots of liquor per week    Comment: occasional  . Drug use: No  . Sexual activity: Yes  Lifestyle  . Physical activity:    Days per week: Not on file    Minutes per session: Not on file  . Stress: Not on file    Relationships  . Social connections:    Talks on phone: Not on file    Gets together: Not on file    Attends religious service: Not on file    Active member of club or organization: Not on file    Attends meetings of clubs or organizations: Not on file    Relationship status: Not on file  . Intimate partner violence:    Fear of current or ex partner: Not on file    Emotionally abused: Not on file    Physically abused: Not on file    Forced sexual activity: Not on file  Other Topics Concern  . Not on file  Social History Narrative  . Not on file    FAMILY HISTORY: Family History  Problem Relation Age of Onset  . Alzheimer's disease Mother   . Stroke Father   . Prostate cancer Father   . Alzheimer's disease Sister   . Stroke Paternal Aunt   . Heart disease Paternal Uncle 25    ALLERGIES:  is allergic to alfuzosin and pravastatin sodium.  MEDICATIONS:  Current Outpatient Medications  Medication Sig Dispense Refill  . acetaminophen (TYLENOL) 325 MG tablet Take 650 mg by mouth every 6 (six) hours as needed.      . ALPRAZolam (XANAX) 0.5 MG tablet Take 1 tablet (0.5 mg total) by mouth at bedtime as needed. for sleep 30 tablet 5  . aspirin 81 MG tablet Take 1 tablet by mouth daily. 1 tab by mouth daily    . ezetimibe (ZETIA) 10 MG tablet TAKE 1 TABLET BY MOUTH ONCE DAILY 90 tablet 0  . Glucosamine-Chondroitin (GLUCOSAMINE CHONDR COMPLEX PO) Take 1 tablet by mouth daily. tab by mouth daily    . lisinopril (PRINIVIL,ZESTRIL) 10 MG tablet Take 1 tablet (10 mg total) by mouth daily. 90 tablet 1  . naproxen (NAPROSYN) 250 MG tablet Take 250 mg by mouth as needed.      . polycarbophil (FIBERCON) 625 MG tablet Take 625 mg by mouth daily. Reported on 05/29/2015    . traMADol (ULTRAM) 50 MG tablet Take 1 tablet (50 mg total) by mouth every 6 (six) hours as needed. 30 tablet 0   No current facility-administered medications for this visit.      PHYSICAL EXAMINATION: ECOG PERFORMANCE  STATUS: 0 - Asymptomatic Vitals:   03/21/18 1418  BP: 133/77  Pulse: 78  Resp: 18  Temp: (!) 96 F (35.6 C)  SpO2: 97%   Filed Weights   03/21/18 1418  Weight: 207 lb 6.4 oz (94.1 kg)    Physical Exam Constitutional:      General: He is not in acute distress. HENT:     Head: Normocephalic and atraumatic.  Eyes:     General: No scleral icterus.    Pupils: Pupils are equal, round, and reactive to light.  Neck:     Musculoskeletal: Normal range of motion and neck supple.  Cardiovascular:     Rate and Rhythm: Normal rate and regular rhythm.     Heart sounds: Normal heart sounds.  Pulmonary:  Effort: Pulmonary effort is normal. No respiratory distress.     Breath sounds: No wheezing.  Abdominal:     General: Bowel sounds are normal. There is no distension.     Palpations: Abdomen is soft. There is no mass.     Tenderness: There is no abdominal tenderness.  Musculoskeletal: Normal range of motion.        General: No deformity.  Skin:    General: Skin is warm and dry.     Findings: No erythema or rash.  Neurological:     Mental Status: He is alert and oriented to person, place, and time.     Cranial Nerves: No cranial nerve deficit.     Coordination: Coordination normal.  Psychiatric:        Behavior: Behavior normal.        Thought Content: Thought content normal.      LABORATORY DATA:  I have reviewed the data as listed Lab Results  Component Value Date   WBC 6.0 02/21/2018   HGB 16.7 02/21/2018   HCT 49.9 02/21/2018   MCV 89.3 02/21/2018   PLT 270 02/21/2018   Recent Labs    09/20/17 0835 02/07/18 1032  NA 138 140  K 4.5 4.5  CL 105 107  CO2 24 23  GLUCOSE 107* 108*  BUN 27* 22  CREATININE 1.40 1.31  CALCIUM 9.5 9.6  PROT 6.8 6.7  ALBUMIN 4.0 4.2  AST 24 22  ALT 27 27  ALKPHOS 73 89  BILITOT 0.9 1.2   Iron/TIBC/Ferritin/ %Sat No results found for: IRON, TIBC, FERRITIN, IRONPCTSAT      ASSESSMENT & PLAN:  1. Erythrocytosis   2.  History of prostate cancer    Labs reviewed and discussed with patient.  Jak V617F mutation negative, MPL negative, CALR negative.  Jak2 Exon 12-15 deletion analysis not successful.  Hold additional testing for now. Hemoglobin 16.7.  Discussed with patient that most of bone marrow primary probably cytosis mutation were negative except 12-50 exon deletion.  I recommend patient to have sleep study to rule out secondary causes.  Patient is not interested in sleep study.  Prefers to monitor his labs for now.  # History of prostate cancer, annual PSA undetectable.  Continue monitor.  Orders Placed This Encounter  Procedures  . CBC with Differential/Platelet    Standing Status:   Future    Standing Expiration Date:   03/21/2019    We spent sufficient time to discuss many aspect of care, questions were answered to patient's satisfaction. The patient knows to call the clinic with any problems questions or concerns.  Return of visit:  6 months.  Earlie Server, MD, PhD Hematology Oncology [facility] Pager- [phone] 03/21/2018

## 2018-03-21 NOTE — Progress Notes (Signed)
Patient here for follow up. No concerns voiced.  °

## 2018-03-28 ENCOUNTER — Other Ambulatory Visit: Payer: Self-pay | Admitting: Internal Medicine

## 2018-03-30 ENCOUNTER — Other Ambulatory Visit: Payer: Self-pay | Admitting: Internal Medicine

## 2018-03-30 ENCOUNTER — Ambulatory Visit: Payer: Medicare Other

## 2018-04-04 ENCOUNTER — Ambulatory Visit (INDEPENDENT_AMBULATORY_CARE_PROVIDER_SITE_OTHER): Payer: Medicare Other

## 2018-04-04 ENCOUNTER — Telehealth: Payer: Self-pay

## 2018-04-04 VITALS — BP 132/70 | HR 50 | Temp 97.4°F | Resp 16 | Ht 73.0 in | Wt 208.8 lb

## 2018-04-04 DIAGNOSIS — Z0000 Encounter for general adult medical examination without abnormal findings: Secondary | ICD-10-CM

## 2018-04-04 NOTE — Patient Instructions (Addendum)
  Bobby Murillo , Thank you for taking time to come for your Medicare Wellness Visit. I appreciate your ongoing commitment to your health goals. Please review the following plan we discussed and let me know if I can assist you in the future.   Follow up as needed.    Bring a copy of your Jay and/or Living Will to be scanned into chart.  Have a great day and stay warm!  These are the goals we discussed: Goals    . Weight (lb) < 200 lb (90.7 kg)       This is a list of the screening recommended for you and due dates:  Health Maintenance  Topic Date Due  . Colon Cancer Screening  10/27/2020  . Tetanus Vaccine  10/16/2022  . Flu Shot  Completed  . Pneumonia vaccines  Completed

## 2018-04-04 NOTE — Telephone Encounter (Signed)
Copied from [place] [phone]. Topic: General - Other >> Apr 04, 2018  9:24 AM Leward Quan A wrote: Reason for CRM: Per patient wife he is on his way may be about 5 minutes late.

## 2018-04-04 NOTE — Progress Notes (Addendum)
Subjective:   Bobby Murillo is a 78 y.o. male who presents for Medicare Annual/Subsequent preventive examination.  Review of Systems:  No ROS.  Medicare Wellness Visit. Additional risk factors are reflected in the social history. Cardiac Risk Factors include: advanced age (>97men, >71 women);male gender;hypertension     Objective:    Vitals: BP 132/70 (BP Location: Left Arm, Patient Position: Sitting, Cuff Size: Normal)   Pulse (!) 50   Temp (!) 97.4 F (36.3 C) (Oral)   Resp 16   Ht 6\' 1"  (1.854 m)   Wt 208 lb 12.8 oz (94.7 kg)   SpO2 95%   BMI 27.55 kg/m   Body mass index is 27.55 kg/m.  Advanced Directives 04/04/2018 03/21/2018 02/21/2018 03/29/2017  Does Patient Have a Medical Advance Directive? Yes Yes Yes Yes  Type of Paramedic of [place];Living will - - Living will;Healthcare Power of Attorney  Does patient want to make changes to medical advance directive? No - Patient declined - - No - Patient declined  Copy of [place] in Chart? No - copy requested - - No - copy requested    Tobacco Social History   Tobacco Use  Smoking Status Never Smoker  Smokeless Tobacco Never Used     Counseling given: Not Answered   Clinical Intake:  Pre-visit preparation completed: Yes        Diabetes: No  How often do you need to have someone help you when you read instructions, pamphlets, or other written materials from your doctor or pharmacy?: 1 - Never  Interpreter Needed?: No     Past Medical History:  Diagnosis Date  . Abnormal LFTs   . Anxiety   . Basal cell carcinoma July 2012   left calf   . Hyperlipidemia   . prostate cancer 2010   Dr. Ronny Bacon  . Sinus bradycardia by electrocardiogram    Past Surgical History:  Procedure Laterality Date  . PROSTATECTOMY  11/2008   transurethral, radical, Dr. Darcus Austin   Family History  Problem Relation Age of Onset  . Alzheimer's disease Mother   . Stroke Father   . Prostate  cancer Father   . Alzheimer's disease Sister   . Stroke Paternal Aunt   . Heart disease Paternal Uncle 76   Social History   Socioeconomic History  . Marital status: Married    Spouse name: Not on file  . Number of children: Not on file  . Years of education: Not on file  . Highest education level: Not on file  Occupational History  . Not on file  Social Needs  . Financial resource strain: Not hard at all  . Food insecurity:    Worry: Never true    Inability: Never true  . Transportation needs:    Medical: No    Non-medical: No  Tobacco Use  . Smoking status: Never Smoker  . Smokeless tobacco: Never Used  Substance and Sexual Activity  . Alcohol use: Yes    Alcohol/week: 8.0 standard drinks    Types: 4 Glasses of wine, 4 Shots of liquor per week    Comment: occasional  . Drug use: No  . Sexual activity: Yes  Lifestyle  . Physical activity:    Days per week: Not on file    Minutes per session: Not on file  . Stress: Not at all  Relationships  . Social connections:    Talks on phone: Not on file    Gets together: Not  on file    Attends religious service: Not on file    Active member of club or organization: Not on file    Attends meetings of clubs or organizations: Not on file    Relationship status: Not on file  Other Topics Concern  . Not on file  Social History Narrative  . Not on file    Outpatient Encounter Medications as of 04/04/2018  Medication Sig  . acetaminophen (TYLENOL) 325 MG tablet Take 650 mg by mouth every 6 (six) hours as needed.    . ALPRAZolam (XANAX) 0.5 MG tablet Take 1 tablet (0.5 mg total) by mouth at bedtime as needed. for sleep  . aspirin 81 MG tablet Take 1 tablet by mouth daily. 1 tab by mouth daily  . ezetimibe (ZETIA) 10 MG tablet TAKE 1 TABLET BY MOUTH ONCE DAILY  . Glucosamine-Chondroitin (GLUCOSAMINE CHONDR COMPLEX PO) Take 1 tablet by mouth daily. tab by mouth daily  . lisinopril (PRINIVIL,ZESTRIL) 10 MG tablet Take 1 tablet  (10 mg total) by mouth daily.  . naproxen (NAPROSYN) 250 MG tablet Take 250 mg by mouth as needed.    . polycarbophil (FIBERCON) 625 MG tablet Take 625 mg by mouth daily. Reported on 05/29/2015  . traMADol (ULTRAM) 50 MG tablet Take 1 tablet (50 mg total) by mouth every 6 (six) hours as needed.   No facility-administered encounter medications on file as of 04/04/2018.     Activities of Daily Living In your present state of health, do you have any difficulty performing the following activities: 04/04/2018  Hearing? N  Vision? N  Difficulty concentrating or making decisions? N  Walking or climbing stairs? N  Dressing or bathing? N  Doing errands, shopping? N  Preparing Food and eating ? N  Using the Toilet? N  In the past six months, have you accidently leaked urine? N  Do you have problems with loss of bowel control? N  Managing your Medications? N  Managing your Finances? N  Housekeeping or managing your Housekeeping? N  Some recent data might be hidden    Patient Care Team: Crecencio Mc, MD as PCP - General (Internal Medicine)   Assessment:   This is a routine wellness examination for Bobby Murillo.  Reports L foot arthritis increase; deferred to pcp for follow up.  Health Screenings  Colonoscopy 10-28-10 PSA 02-07-18 (0.00) Bone Density 03-04-11 Glaucoma -none reported Hearing  -passes the whisper test Glucose 02/07/18 (108) Cholesterol 02-07-18 (207) Dental- every 4 months Vision- 2019  Social  Alcohol intake -yes Smoking history- none Smokers in home? none Illicit drug use? none Exercise -golf, tennis, waking. He plans to join [employer] aerobic program. Diet -regular Sexually Active -yes  Safety  Patient feels safe at home.  Patient does have smoke detectors at home  Patient does wear sunscreen or protective clothing when in direct sunlight  Patient does wear seat belt when driving or riding with others.   Activities of Daily Living Patient can do their  own household chores. Denies needing assistance with: driving, feeding themselves, getting from bed to chair, getting to the toilet, bathing/showering, dressing, managing money, climbing flight of stairs, or preparing meals.   Depression Screen Patient denies losing interest in daily life, feeling hopeless, or crying easily over simple problems.   Fall Screen Patient denies being afraid of falling or falling in the last year.   Memory Screen Patient denies problems with memory, misplacing items, and is able to balance checkbook/bank accounts.  Patient is  alert, normal appearance, oriented to person/place/and time. Correctly identified the president of the [place], recall of 3/3 objects, and performing simple calculations.  Patient displays appropriate judgement and can read correct time from watch face.   Immunizations The following Immunizations are up to date: Influenza, shingles, pneumonia, and tetanus.   Other Providers Patient Care Team: Crecencio Mc, MD as PCP - General (Internal Medicine)  Exercise Activities and Dietary recommendations Current Exercise Habits: Home exercise routine, Intensity: Mild  Goals    . Weight (lb) < 200 lb (90.7 kg)       Fall Risk Fall Risk  04/04/2018 03/29/2017 02/01/2017 05/29/2015 11/08/2013  Falls in the past year? 0 No Yes No Yes  Number falls in past yr: - - 1 - 1  Injury with Fall? - No No - No  Risk for fall due to : - - - - -  Risk for fall due to: Comment - - - - -   Depression Screen PHQ 2/9 Scores 04/04/2018 03/29/2017 02/01/2017 11/08/2013  PHQ - 2 Score 0 0 0 0  PHQ- 9 Score - 0 1 -    Cognitive Function     6CIT Screen 04/04/2018 03/29/2017  What Year? 0 points 0 points  What month? 0 points 0 points  What time? 0 points 0 points  Count back from 20 0 points 0 points  Months in reverse 0 points 0 points  Repeat phrase 0 points 0 points  Total Score 0 0    Immunization History  Administered Date(s) Administered  .  Influenza Split 02/13/2011  . Influenza, High Dose Seasonal PF 12/30/2015, 01/07/2018  . Influenza,inj,Quad PF,6+ Mos 11/08/2013, 11/30/2014, 03/29/2017  . Influenza-Unspecified 12/30/2012  . Pneumococcal Conjugate-13 11/08/2013  . Pneumococcal Polysaccharide-23 02/13/2011, 02/07/2018  . Tdap 10/15/2012   Screening Tests Health Maintenance  Topic Date Due  . COLONOSCOPY  10/27/2020  . TETANUS/TDAP  10/16/2022  . INFLUENZA VACCINE  Completed  . PNA vac Low Risk Adult  Completed       Plan:    End of life planning; Advance aging; Advanced directives discussed. Copy of current HCPOA/Living Will requested.    I have personally reviewed and noted the following in the patient's chart:   . Medical and social history . Use of alcohol, tobacco or illicit drugs  . Current medications and supplements . Functional ability and status . Nutritional status . Physical activity . Advanced directives . List of other physicians . Hospitalizations, surgeries, and ER visits in previous 12 months . Vitals . Screenings to include cognitive, depression, and falls . Referrals and appointments  In addition, I have reviewed and discussed with patient certain preventive protocols, quality metrics, and best practice recommendations. A written personalized care plan for preventive services as well as general preventive health recommendations were provided to patient.     OBrien-Blaney, Coner Gibbard L, LPN  1/88/4166   I have reviewed the above information and agree with above.   Deborra Medina, MD

## 2018-04-04 NOTE — Telephone Encounter (Signed)
Denisa has been advised.

## 2018-04-05 ENCOUNTER — Encounter: Payer: Self-pay | Admitting: Internal Medicine

## 2018-04-11 DIAGNOSIS — C44719 Basal cell carcinoma of skin of left lower limb, including hip: Secondary | ICD-10-CM | POA: Diagnosis not present

## 2018-04-11 DIAGNOSIS — L570 Actinic keratosis: Secondary | ICD-10-CM | POA: Diagnosis not present

## 2018-04-25 ENCOUNTER — Other Ambulatory Visit: Payer: Self-pay | Admitting: Internal Medicine

## 2018-04-25 NOTE — Telephone Encounter (Signed)
Refilled: 08/02/2017 Last OV: 02/07/2018 Next OV: 08/15/2018

## 2018-06-24 ENCOUNTER — Other Ambulatory Visit: Payer: Self-pay | Admitting: Internal Medicine

## 2018-08-15 ENCOUNTER — Encounter: Payer: Self-pay | Admitting: Internal Medicine

## 2018-08-15 ENCOUNTER — Other Ambulatory Visit: Payer: Self-pay

## 2018-08-15 ENCOUNTER — Ambulatory Visit (INDEPENDENT_AMBULATORY_CARE_PROVIDER_SITE_OTHER): Payer: Medicare Other | Admitting: Internal Medicine

## 2018-08-15 DIAGNOSIS — I10 Essential (primary) hypertension: Secondary | ICD-10-CM | POA: Diagnosis not present

## 2018-08-15 DIAGNOSIS — E782 Mixed hyperlipidemia: Secondary | ICD-10-CM

## 2018-08-15 DIAGNOSIS — E785 Hyperlipidemia, unspecified: Secondary | ICD-10-CM

## 2018-08-15 DIAGNOSIS — M130 Polyarthritis, unspecified: Secondary | ICD-10-CM

## 2018-08-15 DIAGNOSIS — R5383 Other fatigue: Secondary | ICD-10-CM | POA: Diagnosis not present

## 2018-08-15 DIAGNOSIS — Z8546 Personal history of malignant neoplasm of prostate: Secondary | ICD-10-CM

## 2018-08-15 DIAGNOSIS — M170 Bilateral primary osteoarthritis of knee: Secondary | ICD-10-CM

## 2018-08-15 NOTE — Progress Notes (Signed)
Virtual Visit via [email or web address]  This visit type was conducted due to national recommendations for restrictions regarding the COVID-19 pandemic (e.g. social distancing).  This format is felt to be most appropriate for this patient at this time.  All issues noted in this document were discussed and addressed.  No physical exam was performed (except for noted visual exam findings with Video Visits).   I connected with@ on 08/15/18 at 10:30 AM EDT by a video enabled telemedicine application or telephone and verified that I am speaking with the correct person using two identifiers. Location patient: home Location provider: work or home office Persons participating in the virtual visit: patient, provider  I discussed the limitations, risks, security and privacy concerns of performing an evaluation and management service by telephone and the availability of in person appointments. I also discussed with the patient that there may be a patient responsible charge related to this service. The patient expressed understanding and agreed to proceed.  Reason for visit: follow up   HPI:  78 yr old male with history of prostate CA, DJD knee, hypertension and hyperlipidemia presents for 6 month follow up.  Hypertension: patient checks blood pressure twice weekly at home.  Readings have been for the most part < 140/80 at rest . Patient is following a reduced salt diet most days and is taking medications as prescribed.  The patient has no signs or symptoms of COVID 19 infection (fever, cough, sore throat  or shortness of breath beyond what is typical for patient).  Patient denies contact with other persons with the above mentioned symptoms or with anyone confirmed to have COVID 19.  He recently had an episode of left foot pain localized to the first MT head due to arthritic changes  He has been having left pain that is aggravated by walking but not with activities such as playing  couples tennis or golf. He has been  using Aleve as needed and glucosamine the night before tennis which he feels helps     ROS: See pertinent positives and negatives per HPI.  Past Medical History:  Diagnosis Date  . Abnormal LFTs   . Anxiety   . Basal cell carcinoma July 2012   left calf   . Hyperlipidemia   . prostate cancer 2010   Dr. Ronny Bacon  . Sinus bradycardia by electrocardiogram     Past Surgical History:  Procedure Laterality Date  . PROSTATECTOMY  11/2008   transurethral, radical, Dr. Darcus Austin    Family History  Problem Relation Age of Onset  . Alzheimer's disease Mother   . Stroke Father   . Prostate cancer Father   . Alzheimer's disease Sister   . Stroke Paternal Aunt   . Heart disease Paternal Uncle 70    SOCIAL HX:  Social History   Tobacco Use  . Smoking status: Never Smoker  . Smokeless tobacco: Never Used  Substance Use Topics  . Alcohol use: Yes    Alcohol/week: 8.0 standard drinks    Types: 4 Glasses of wine, 4 Shots of liquor per week    Comment: occasional  . Drug use: No     Current Outpatient Medications:  .  acetaminophen (TYLENOL) 325 MG tablet, Take 650 mg by mouth every 6 (six) hours as needed.  , Disp: , Rfl:  .  ALPRAZolam (XANAX) 0.5 MG tablet, TAKE 1 TABLET BY MOUTH AT BEDTIME AS NEEDED FOR SLEEP, Disp: 30 tablet, Rfl: 5 .  aspirin 81 MG tablet, Take 1 tablet by mouth  daily. 1 tab by mouth daily, Disp: , Rfl:  .  ezetimibe (ZETIA) 10 MG tablet, Take 1 tablet by mouth once daily, Disp: 90 tablet, Rfl: 0 .  Glucosamine-Chondroitin (GLUCOSAMINE CHONDR COMPLEX PO), Take 1 tablet by mouth daily. tab by mouth daily, Disp: , Rfl:  .  lisinopril (PRINIVIL,ZESTRIL) 10 MG tablet, TAKE 1 TABLET BY MOUTH ONCE DAILY, Disp: 90 tablet, Rfl: 1 .  naproxen (NAPROSYN) 250 MG tablet, Take 250 mg by mouth as needed.  , Disp: , Rfl:  .  polycarbophil (FIBERCON) 625 MG tablet, Take 625 mg by mouth daily. Reported on 05/29/2015, Disp: , Rfl:  .  traMADol (ULTRAM) 50 MG tablet, Take 1 tablet (50  mg total) by mouth every 6 (six) hours as needed., Disp: 30 tablet, Rfl: 0  EXAM:  VITALS per patient if applicable:  GENERAL: alert, oriented, appears well and in no acute distress  HEENT: atraumatic, conjunttiva clear, no obvious abnormalities on inspection of external nose and ears  NECK: normal movements of the head and neck  LUNGS: on inspection no signs of respiratory distress, breathing rate appears normal, no obvious gross SOB, gasping or wheezing  CV: no obvious cyanosis  MS: moves all visible extremities without noticeable abnormality  PSYCH/NEURO: pleasant and cooperative, no obvious depression or anxiety, speech and thought processing grossly intact  ASSESSMENT AND PLAN:  Discussed the following assessment and plan:  Fatigue, unspecified type - Plan: Comprehensive metabolic panel, TSH, CBC with Differential/Platelet, Testos,Total,Free and SHBG (Male)  Moderate mixed hyperlipidemia not requiring statin therapy - Plan: Lipid panel  History of prostate cancer  Essential hypertension  Hyperlipidemia LDL goal <100  Primary osteoarthritis of both knees  Polyarthritis  History of prostate cancer PSA was undetectable for years, followed by a rise, which was treated with XRT .  Annual PSA is due in November   Lab Results  Component Value Date   PSA 0.00 (L) 02/07/2018   PSA 0.00 (L) 02/01/2017   PSA 0.00 (L) 12/30/2015     Essential hypertension Well controlled on current regimen. Renal function is due , no changes today.  Lab Results  Component Value Date   CREATININE 1.31 02/07/2018   Lab Results  Component Value Date   NA 140 02/07/2018   K 4.5 02/07/2018   CL 107 02/07/2018   CO2 23 02/07/2018     Hyperlipidemia LDL goal <100 Tolerating Zetia.LDL lowered by nearly 100 pts. LFTs due .  He is   Statin intolerant/   Lab Results  Component Value Date   CHOL 207 (H) 02/07/2018   HDL 57.90 02/07/2018   LDLCALC 129 (H) 02/07/2018   LDLDIRECT  184.0 12/30/2015   TRIG 99.0 02/07/2018   CHOLHDL 4 02/07/2018   Lab Results  Component Value Date   ALT 27 02/07/2018   AST 22 02/07/2018   ALKPHOS 89 02/07/2018   BILITOT 1.2 02/07/2018     Degenerative joint disease of knee continue daily use of glucosamine supplements and prn naproxen.   Polyarthritis Multiple joints affected by DJD .   Ruled inflammatory states. ESR normal.  Continue prn aleve.   Lab Results  Component Value Date   ESRSEDRATE 20 02/07/2018     Other malaise and fatigue Recurrent problem, with no signs of heart failure or CAD.  Sleep apnea,  Low testosterone are both  potential diagnoses, given his history of prosate ca,  testosterone supplementation is contraindicated      I discussed the assessment and treatment plan with  the patient. The patient was provided an opportunity to ask questions and all were answered. The patient agreed with the plan and demonstrated an understanding of the instructions.   The patient was advised to call back or seek an in-person evaluation if the symptoms worsen or if the condition fails to improve as anticipated.  I provided 25 minutes of non-face-to-face time during this encounter.   Crecencio Mc, MD

## 2018-08-16 NOTE — Progress Notes (Signed)
Pt is scheduled for fasting lab work. Pt is aware of appt date and time.

## 2018-08-16 NOTE — Assessment & Plan Note (Signed)
Recurrent problem, with no signs of heart failure or CAD.  Sleep apnea,  Low testosterone are both  potential diagnoses, given his history of prosate ca,  testosterone supplementation is contraindicated

## 2018-08-16 NOTE — Assessment & Plan Note (Signed)
Tolerating Zetia.LDL lowered by nearly 100 pts. LFTs due .  He is   Statin intolerant/   Lab Results  Component Value Date   CHOL 207 (H) 02/07/2018   HDL 57.90 02/07/2018   LDLCALC 129 (H) 02/07/2018   LDLDIRECT 184.0 12/30/2015   TRIG 99.0 02/07/2018   CHOLHDL 4 02/07/2018   Lab Results  Component Value Date   ALT 27 02/07/2018   AST 22 02/07/2018   ALKPHOS 89 02/07/2018   BILITOT 1.2 02/07/2018

## 2018-08-16 NOTE — Assessment & Plan Note (Addendum)
Well controlled on current regimen. Renal function is due , no changes today.  Lab Results  Component Value Date   CREATININE 1.31 02/07/2018   Lab Results  Component Value Date   NA 140 02/07/2018   K 4.5 02/07/2018   CL 107 02/07/2018   CO2 23 02/07/2018

## 2018-08-16 NOTE — Assessment & Plan Note (Signed)
Multiple joints affected by DJD .   Ruled inflammatory states. ESR normal.  Continue prn aleve.   Lab Results  Component Value Date   ESRSEDRATE 20 02/07/2018

## 2018-08-16 NOTE — Assessment & Plan Note (Signed)
PSA was undetectable for years, followed by a rise, which was treated with XRT .  Annual PSA is due in November   Lab Results  Component Value Date   PSA 0.00 (L) 02/07/2018   PSA 0.00 (L) 02/01/2017   PSA 0.00 (L) 12/30/2015

## 2018-08-16 NOTE — Assessment & Plan Note (Signed)
continue daily use of glucosamine supplements and prn naproxen.

## 2018-08-17 ENCOUNTER — Other Ambulatory Visit (INDEPENDENT_AMBULATORY_CARE_PROVIDER_SITE_OTHER): Payer: Medicare Other

## 2018-08-17 ENCOUNTER — Other Ambulatory Visit: Payer: Self-pay

## 2018-08-17 DIAGNOSIS — E782 Mixed hyperlipidemia: Secondary | ICD-10-CM

## 2018-08-17 DIAGNOSIS — R5383 Other fatigue: Secondary | ICD-10-CM

## 2018-08-17 LAB — COMPREHENSIVE METABOLIC PANEL
ALT: 29 U/L (ref 0–53)
AST: 25 U/L (ref 0–37)
Albumin: 4.1 g/dL (ref 3.5–5.2)
Alkaline Phosphatase: 82 U/L (ref 39–117)
BUN: 25 mg/dL — ABNORMAL HIGH (ref 6–23)
CO2: 25 mEq/L (ref 19–32)
Calcium: 9.5 mg/dL (ref 8.4–10.5)
Chloride: 104 mEq/L (ref 96–112)
Creatinine, Ser: 1.36 mg/dL (ref 0.40–1.50)
GFR: 50.92 mL/min — ABNORMAL LOW (ref >60.00)
Glucose, Bld: 94 mg/dL (ref 70–99)
Potassium: 5.2 mEq/L — ABNORMAL HIGH (ref 3.5–5.1)
Sodium: 137 mEq/L (ref 135–145)
Total Bilirubin: 1.2 mg/dL (ref 0.2–1.2)
Total Protein: 6.5 g/dL (ref 6.0–8.3)

## 2018-08-17 LAB — TSH: TSH: 2.37 u[IU]/mL (ref 0.35–4.50)

## 2018-08-17 LAB — LIPID PANEL
Cholesterol: 216 mg/dL — ABNORMAL HIGH (ref 0–200)
HDL: 58.5 mg/dL (ref >39.00)
LDL Cholesterol: 140 mg/dL — ABNORMAL HIGH (ref 0–99)
NonHDL: 157.75
Total CHOL/HDL Ratio: 4
Triglycerides: 87 mg/dL (ref 0.0–149.0)
VLDL: 17.4 mg/dL (ref 0.0–40.0)

## 2018-08-17 LAB — CBC WITH DIFFERENTIAL/PLATELET
Basophils Absolute: 0.1 10*3/uL (ref 0.0–0.1)
Basophils Relative: 1.1 % (ref 0.0–3.0)
Eosinophils Absolute: 0.2 10*3/uL (ref 0.0–0.7)
Eosinophils Relative: 4.2 % (ref 0.0–5.0)
HCT: 48.3 % (ref 39.0–52.0)
Hemoglobin: 16.8 g/dL (ref 13.0–17.0)
Lymphocytes Relative: 23.2 % (ref 12.0–46.0)
Lymphs Abs: 1.1 10*3/uL (ref 0.7–4.0)
MCHC: 34.8 g/dL (ref 30.0–36.0)
MCV: 91 fl (ref 78.0–100.0)
Monocytes Absolute: 0.6 10*3/uL (ref 0.1–1.0)
Monocytes Relative: 13.5 % — ABNORMAL HIGH (ref 3.0–12.0)
Neutro Abs: 2.8 10*3/uL (ref 1.4–7.7)
Neutrophils Relative %: 58 % (ref 43.0–77.0)
Platelets: 232 10*3/uL (ref 150.0–400.0)
RBC: 5.3 Mil/uL (ref 4.22–5.81)
RDW: 13.2 % (ref 11.5–15.5)
WBC: 4.8 10*3/uL (ref 4.0–10.5)

## 2018-08-18 ENCOUNTER — Other Ambulatory Visit: Payer: Self-pay | Admitting: Internal Medicine

## 2018-08-18 DIAGNOSIS — E875 Hyperkalemia: Secondary | ICD-10-CM

## 2018-08-22 ENCOUNTER — Other Ambulatory Visit: Payer: Self-pay

## 2018-08-22 ENCOUNTER — Other Ambulatory Visit (INDEPENDENT_AMBULATORY_CARE_PROVIDER_SITE_OTHER): Payer: Medicare Other

## 2018-08-22 DIAGNOSIS — E875 Hyperkalemia: Secondary | ICD-10-CM

## 2018-08-22 LAB — BASIC METABOLIC PANEL
BUN: 24 mg/dL — ABNORMAL HIGH (ref 6–23)
CO2: 27 mEq/L (ref 19–32)
Calcium: 9.3 mg/dL (ref 8.4–10.5)
Chloride: 107 mEq/L (ref 96–112)
Creatinine, Ser: 1.49 mg/dL (ref 0.40–1.50)
GFR: 45.83 mL/min — ABNORMAL LOW (ref >60.00)
Glucose, Bld: 136 mg/dL — ABNORMAL HIGH (ref 70–99)
Potassium: 4.8 mEq/L (ref 3.5–5.1)
Sodium: 140 mEq/L (ref 135–145)

## 2018-08-22 LAB — TESTOS,TOTAL,FREE AND SHBG (FEMALE)
Free Testosterone: 55.7 pg/mL (ref 30.0–135.0)
Sex Hormone Binding: 66 nmol/L (ref 22–77)
Testosterone, Total, LC-MS-MS: 468 ng/dL (ref 250–1100)

## 2018-08-23 ENCOUNTER — Other Ambulatory Visit: Payer: Self-pay | Admitting: Internal Medicine

## 2018-08-23 DIAGNOSIS — I10 Essential (primary) hypertension: Secondary | ICD-10-CM

## 2018-08-23 DIAGNOSIS — E875 Hyperkalemia: Secondary | ICD-10-CM

## 2018-08-23 DIAGNOSIS — N182 Chronic kidney disease, stage 2 (mild): Secondary | ICD-10-CM

## 2018-08-23 MED ORDER — AMLODIPINE BESYLATE 5 MG PO TABS: 5.0000 mg | ORAL_TABLET | Freq: Every day | ORAL | 3 refills | Status: DC

## 2018-08-31 ENCOUNTER — Encounter: Payer: Self-pay | Admitting: Internal Medicine

## 2018-08-31 ENCOUNTER — Other Ambulatory Visit: Payer: Self-pay

## 2018-08-31 ENCOUNTER — Ambulatory Visit (INDEPENDENT_AMBULATORY_CARE_PROVIDER_SITE_OTHER): Payer: Medicare Other | Admitting: Internal Medicine

## 2018-08-31 VITALS — BP 138/79 | HR 48

## 2018-08-31 DIAGNOSIS — I10 Essential (primary) hypertension: Secondary | ICD-10-CM | POA: Diagnosis not present

## 2018-08-31 DIAGNOSIS — R109 Unspecified abdominal pain: Secondary | ICD-10-CM | POA: Diagnosis not present

## 2018-08-31 DIAGNOSIS — K5900 Constipation, unspecified: Secondary | ICD-10-CM | POA: Diagnosis not present

## 2018-08-31 MED ORDER — LACTULOSE 20 GM/30ML PO SOLN: ORAL | 3 refills | Status: DC

## 2018-08-31 NOTE — Progress Notes (Signed)
Pt stated that he was finally able to have a bowel movement this morning. Pt stated that it was not a normal stool and it was not well formed.

## 2018-08-31 NOTE — Patient Instructions (Signed)
I have sent a prescription for a cathartic laxative called lactulose to your pharmacy,  It is in liquid form and can be used every 6 hours to releive severe constipation.  It is better tolerated than citrate of magnesium.  You should use it sparingly (isolated occasions,  Not daily) and use a daily  STOOL SOFTENER (docusate) and Miralax   Only resort to  stimulant Laxative such as Dulcolax or Sennokot S at bedtime if you are taking narcotics for pain control and if the fiber laxatives such as metamucil and miralax have failed to relieve your constipation.   Janett Billow will call you with the lab appt

## 2018-08-31 NOTE — Progress Notes (Signed)
Virtual Visit via [email or web address]  This visit type was conducted due to national recommendations for restrictions regarding the COVID-19 pandemic (e.g. social distancing).  This format is felt to be most appropriate for this patient at this time.  All issues noted in this document were discussed and addressed.  No physical exam was performed (except for noted visual exam findings with Video Visits).   I connected with@ on 08/31/18 at  9:00 AM EDT by a video enabled telemedicine application or telephone and verified that I am speaking with the correct person using two identifiers. Location patient: home Location provider: work or home office Persons participating in the virtual visit: patient, provider  I discussed the limitations, risks, security and privacy concerns of performing an evaluation and management service by telephone and the availability of in person appointments. I also discussed with the patient that there may be a patient responsible charge related to this service. The patient expressed understanding and agreed to proceed.  Reason for visit: left sided back pain   HPI: 78 yr old male with remote history of nephrolithiasis and prostate cancer  presents with one week history of "discomfort' in the left CVA  .  The pain radiates to the groin at times.  It is aggravated by very deep breathing,  But not by walking or bending over.  He denies cough, nausea ,  Urinary frequency , but notes that it improved after resolution of constipation that had also lasted one week.  He has taken a few tramadol along with aleve and tylenol which did not help.   HTN:  Lisinopril was stopped last week.  subsequent BP checked at home was 138/79   ROS: See pertinent positives and negatives per HPI.  Past Medical History:  Diagnosis Date  . Abnormal LFTs   . Anxiety   . Basal cell carcinoma July 2012   left calf   . Hyperlipidemia   . prostate cancer 2010   Dr. Ronny Bacon  . Sinus bradycardia by  electrocardiogram     Past Surgical History:  Procedure Laterality Date  . PROSTATECTOMY  11/2008   transurethral, radical, Dr. Darcus Austin    Family History  Problem Relation Age of Onset  . Alzheimer's disease Mother   . Stroke Father   . Prostate cancer Father   . Alzheimer's disease Sister   . Stroke Paternal Aunt   . Heart disease Paternal Uncle 24    SOCIAL HX:  reports that he has never smoked. He has never used smokeless tobacco. He reports current alcohol use of about 8.0 standard drinks of alcohol per week. He reports that he does not use drugs.   Current Outpatient Medications:  .  acetaminophen (TYLENOL) 325 MG tablet, Take 650 mg by mouth every 6 (six) hours as needed.  , Disp: , Rfl:  .  ALPRAZolam (XANAX) 0.5 MG tablet, TAKE 1 TABLET BY MOUTH AT BEDTIME AS NEEDED FOR SLEEP, Disp: 30 tablet, Rfl: 5 .  aspirin 81 MG tablet, Take 1 tablet by mouth daily. 1 tab by mouth daily, Disp: , Rfl:  .  ezetimibe (ZETIA) 10 MG tablet, Take 1 tablet by mouth once daily, Disp: 90 tablet, Rfl: 0 .  Glucosamine-Chondroitin (GLUCOSAMINE CHONDR COMPLEX PO), Take 1 tablet by mouth daily. tab by mouth daily, Disp: , Rfl:  .  naproxen (NAPROSYN) 250 MG tablet, Take 250 mg by mouth as needed.  , Disp: , Rfl:  .  polycarbophil (FIBERCON) 625 MG tablet, Take 625 mg by mouth daily.  Reported on 05/29/2015, Disp: , Rfl:  .  traMADol (ULTRAM) 50 MG tablet, Take 1 tablet (50 mg total) by mouth every 6 (six) hours as needed., Disp: 30 tablet, Rfl: 0 .  amLODipine (NORVASC) 5 MG tablet, Take 1 tablet (5 mg total) by mouth daily. (Patient not taking: Reported on 08/31/2018), Disp: 90 tablet, Rfl: 3 .  Lactulose 20 GM/30ML SOLN, 30 ml every 4 hours until constipation is relieved, Disp: 236 mL, Rfl: 3  EXAM:  VITALS per patient if applicable:  GENERAL: alert, oriented, appears well and in no acute distress  HEENT: atraumatic, conjunttiva clear, no obvious abnormalities on inspection of external nose and  ears  NECK: normal movements of the head and neck  LUNGS: on inspection no signs of respiratory distress, breathing rate appears normal, no obvious gross SOB, gasping or wheezing  CV: no obvious cyanosis  MS: moves all visible extremities without noticeable abnormality  PSYCH/NEURO: pleasant and cooperative, no obvious depression or anxiety, speech and thought processing grossly intact  ASSESSMENT AND PLAN:  Discussed the following assessment and plan:  Acute left flank pain Etiology unclear but includes obstipation given its current resolution following resolution of constipation.  Palin films of abdomen ordered.  Urine studies ordered   Essential hypertension Well controlled on current regimen. Renal function was noted to drop slightly,  lisinopril discontinued  Repeat in one week  Lab Results  Component Value Date   CREATININE 1.49 08/22/2018   Lab Results  Component Value Date   NA 140 08/22/2018   K 4.8 08/22/2018   CL 107 08/22/2018   CO2 27 08/22/2018       I discussed the assessment and treatment plan with the patient. The patient was provided an opportunity to ask questions and all were answered. The patient agreed with the plan and demonstrated an understanding of the instructions.   The patient was advised to call back or seek an in-person evaluation if the symptoms worsen or if the condition fails to improve as anticipated.  I provided 25  minutes of non-face-to-face time during this encounter.   Bobby Mc, MD

## 2018-08-31 NOTE — Progress Notes (Signed)
Spoke with pt and he is scheduled for lab work on Friday. Pt is aware of appt date and time.

## 2018-09-01 ENCOUNTER — Encounter: Payer: Self-pay | Admitting: Internal Medicine

## 2018-09-01 DIAGNOSIS — R109 Unspecified abdominal pain: Secondary | ICD-10-CM | POA: Insufficient documentation

## 2018-09-01 DIAGNOSIS — K5900 Constipation, unspecified: Secondary | ICD-10-CM | POA: Insufficient documentation

## 2018-09-01 NOTE — Assessment & Plan Note (Signed)
Well controlled on current regimen. Renal function was noted to drop slightly,  lisinopril discontinued  Repeat in one week  Lab Results  Component Value Date   CREATININE 1.49 08/22/2018   Lab Results  Component Value Date   NA 140 08/22/2018   K 4.8 08/22/2018   CL 107 08/22/2018   CO2 27 08/22/2018

## 2018-09-01 NOTE — Assessment & Plan Note (Signed)
Etiology unclear but includes obstipation given its current resolution following resolution of constipation.  Palin films of abdomen ordered.  Urine studies ordered

## 2018-09-01 NOTE — Assessment & Plan Note (Signed)
Lactulose prescribed for immediate relief / Encouraged to  increase the fiber in his diet to 25 g daily using Fruit and vegetables , [employer] and Atkins protein bars , and the low carb breads .  Also recommended adding  miralax, metamucil, fibercon, or citrucel daily to supplement her fiber. She was advised that she  can also combine them daily with colace,  Also,  make 4 16 ounce servings of water a daily goal

## 2018-09-02 ENCOUNTER — Other Ambulatory Visit (INDEPENDENT_AMBULATORY_CARE_PROVIDER_SITE_OTHER): Payer: Medicare Other

## 2018-09-02 ENCOUNTER — Other Ambulatory Visit: Payer: Self-pay

## 2018-09-02 DIAGNOSIS — R109 Unspecified abdominal pain: Secondary | ICD-10-CM

## 2018-09-02 DIAGNOSIS — E875 Hyperkalemia: Secondary | ICD-10-CM | POA: Diagnosis not present

## 2018-09-02 DIAGNOSIS — I10 Essential (primary) hypertension: Secondary | ICD-10-CM

## 2018-09-02 DIAGNOSIS — N182 Chronic kidney disease, stage 2 (mild): Secondary | ICD-10-CM | POA: Diagnosis not present

## 2018-09-02 LAB — SEDIMENTATION RATE: Sed Rate: 34 mm/hr — ABNORMAL HIGH (ref 0–20)

## 2018-09-02 LAB — URINALYSIS, ROUTINE W REFLEX MICROSCOPIC
Bilirubin Urine: NEGATIVE
Ketones, ur: NEGATIVE
Leukocytes,Ua: NEGATIVE
Nitrite: NEGATIVE
Specific Gravity, Urine: >=1.03 — AB (ref 1.000–1.030)
Total Protein, Urine: NEGATIVE
Urine Glucose: NEGATIVE
Urobilinogen, UA: 0.2 (ref 0.0–1.0)
pH: 5 (ref 5.0–8.0)

## 2018-09-02 LAB — CBC WITH DIFFERENTIAL/PLATELET
Basophils Absolute: 0 10*3/uL (ref 0.0–0.1)
Basophils Relative: 0.4 % (ref 0.0–3.0)
Eosinophils Absolute: 0.2 10*3/uL (ref 0.0–0.7)
Eosinophils Relative: 2.1 % (ref 0.0–5.0)
HCT: 44.6 % (ref 39.0–52.0)
Hemoglobin: 14.9 g/dL (ref 13.0–17.0)
Lymphocytes Relative: 8.4 % — ABNORMAL LOW (ref 12.0–46.0)
Lymphs Abs: 0.8 10*3/uL (ref 0.7–4.0)
MCHC: 33.4 g/dL (ref 30.0–36.0)
MCV: 92.4 fl (ref 78.0–100.0)
Monocytes Absolute: 1.3 10*3/uL — ABNORMAL HIGH (ref 0.1–1.0)
Monocytes Relative: 13.1 % — ABNORMAL HIGH (ref 3.0–12.0)
Neutro Abs: 7.3 10*3/uL (ref 1.4–7.7)
Neutrophils Relative %: 76 % (ref 43.0–77.0)
Platelets: 317 10*3/uL (ref 150.0–400.0)
RBC: 4.83 Mil/uL (ref 4.22–5.81)
RDW: 13.2 % (ref 11.5–15.5)
WBC: 9.6 10*3/uL (ref 4.0–10.5)

## 2018-09-02 LAB — BASIC METABOLIC PANEL
BUN: 26 mg/dL — ABNORMAL HIGH (ref 6–23)
CO2: 26 mEq/L (ref 19–32)
Calcium: 8.8 mg/dL (ref 8.4–10.5)
Chloride: 102 mEq/L (ref 96–112)
Creatinine, Ser: 2.24 mg/dL — ABNORMAL HIGH (ref 0.40–1.50)
GFR: 28.63 mL/min — ABNORMAL LOW (ref >60.00)
Glucose, Bld: 104 mg/dL — ABNORMAL HIGH (ref 70–99)
Potassium: 4.4 mEq/L (ref 3.5–5.1)
Sodium: 139 mEq/L (ref 135–145)

## 2018-09-03 ENCOUNTER — Other Ambulatory Visit: Payer: Self-pay | Admitting: Internal Medicine

## 2018-09-03 DIAGNOSIS — R109 Unspecified abdominal pain: Secondary | ICD-10-CM

## 2018-09-03 DIAGNOSIS — Z8546 Personal history of malignant neoplasm of prostate: Secondary | ICD-10-CM

## 2018-09-03 DIAGNOSIS — R3129 Other microscopic hematuria: Secondary | ICD-10-CM

## 2018-09-03 DIAGNOSIS — R944 Abnormal results of kidney function studies: Secondary | ICD-10-CM

## 2018-09-04 ENCOUNTER — Other Ambulatory Visit: Payer: Self-pay | Admitting: Internal Medicine

## 2018-09-04 DIAGNOSIS — R944 Abnormal results of kidney function studies: Secondary | ICD-10-CM

## 2018-09-04 DIAGNOSIS — R109 Unspecified abdominal pain: Secondary | ICD-10-CM

## 2018-09-04 DIAGNOSIS — Z8546 Personal history of malignant neoplasm of prostate: Secondary | ICD-10-CM

## 2018-09-04 DIAGNOSIS — R10A2 Flank pain, left side: Secondary | ICD-10-CM

## 2018-09-04 LAB — URINE CULTURE
MICRO NUMBER:: 588507
SPECIMEN QUALITY:: ADEQUATE

## 2018-09-04 MED ORDER — CIPROFLOXACIN HCL 250 MG PO TABS: 250.0000 mg | ORAL_TABLET | Freq: Two times a day (BID) | ORAL | 0 refills | Status: DC

## 2018-09-04 NOTE — Assessment & Plan Note (Signed)
WTH MICROSCOPIC HEMATURIA, SIGNIFICANT CHANGE IN GFR.  URINE CULTURE SUGGESTIVE OF STONE WITH BIOFILM VS INFECTION. Treating with ciprofloxacin for  2 weeks

## 2018-09-05 ENCOUNTER — Other Ambulatory Visit: Payer: Self-pay

## 2018-09-05 ENCOUNTER — Other Ambulatory Visit: Payer: Self-pay | Admitting: Internal Medicine

## 2018-09-05 ENCOUNTER — Ambulatory Visit
Admission: RE | Admit: 2018-09-05 | Discharge: 2018-09-05 | Disposition: A | Discharge status: Home / Self Care | Payer: Medicare Other | Source: Ambulatory Visit | Attending: Internal Medicine | Admitting: Internal Medicine

## 2018-09-05 DIAGNOSIS — R109 Unspecified abdominal pain: Secondary | ICD-10-CM

## 2018-09-05 DIAGNOSIS — Z8546 Personal history of malignant neoplasm of prostate: Secondary | ICD-10-CM

## 2018-09-05 DIAGNOSIS — N132 Hydronephrosis with renal and ureteral calculous obstruction: Secondary | ICD-10-CM

## 2018-09-05 DIAGNOSIS — R944 Abnormal results of kidney function studies: Secondary | ICD-10-CM | POA: Diagnosis not present

## 2018-09-05 DIAGNOSIS — N1330 Unspecified hydronephrosis: Secondary | ICD-10-CM | POA: Diagnosis not present

## 2018-09-05 DIAGNOSIS — N10 Acute tubulo-interstitial nephritis: Secondary | ICD-10-CM

## 2018-09-05 DIAGNOSIS — I700 Atherosclerosis of aorta: Secondary | ICD-10-CM | POA: Diagnosis not present

## 2018-09-07 ENCOUNTER — Ambulatory Visit: Payer: Medicare Other

## 2018-09-09 ENCOUNTER — Other Ambulatory Visit: Payer: Self-pay

## 2018-09-09 ENCOUNTER — Other Ambulatory Visit (INDEPENDENT_AMBULATORY_CARE_PROVIDER_SITE_OTHER): Payer: Medicare Other

## 2018-09-09 DIAGNOSIS — N10 Acute tubulo-interstitial nephritis: Secondary | ICD-10-CM

## 2018-09-09 DIAGNOSIS — N132 Hydronephrosis with renal and ureteral calculous obstruction: Secondary | ICD-10-CM | POA: Diagnosis not present

## 2018-09-09 LAB — URINALYSIS, ROUTINE W REFLEX MICROSCOPIC
Bilirubin Urine: NEGATIVE
Ketones, ur: NEGATIVE
Leukocytes,Ua: NEGATIVE
Nitrite: NEGATIVE
Specific Gravity, Urine: 1.025 (ref 1.000–1.030)
Total Protein, Urine: NEGATIVE
Urine Glucose: NEGATIVE
Urobilinogen, UA: 0.2 (ref 0.0–1.0)
pH: 5.5 (ref 5.0–8.0)

## 2018-09-09 LAB — BASIC METABOLIC PANEL
BUN: 24 mg/dL — ABNORMAL HIGH (ref 6–23)
CO2: 25 mEq/L (ref 19–32)
Calcium: 9 mg/dL (ref 8.4–10.5)
Chloride: 107 mEq/L (ref 96–112)
Creatinine, Ser: 1.51 mg/dL — ABNORMAL HIGH (ref 0.40–1.50)
GFR: 45.13 mL/min — ABNORMAL LOW (ref >60.00)
Glucose, Bld: 121 mg/dL — ABNORMAL HIGH (ref 70–99)
Potassium: 4.1 mEq/L (ref 3.5–5.1)
Sodium: 141 mEq/L (ref 135–145)

## 2018-09-10 LAB — URINE CULTURE
MICRO NUMBER:: 611990
Result:: NO GROWTH
SPECIMEN QUALITY:: ADEQUATE

## 2018-09-19 ENCOUNTER — Ambulatory Visit: Payer: Medicare Other | Admitting: Oncology

## 2018-09-19 ENCOUNTER — Other Ambulatory Visit: Payer: Self-pay | Admitting: Internal Medicine

## 2018-09-19 ENCOUNTER — Other Ambulatory Visit: Payer: Self-pay

## 2018-09-19 ENCOUNTER — Ambulatory Visit (INDEPENDENT_AMBULATORY_CARE_PROVIDER_SITE_OTHER): Payer: Medicare Other

## 2018-09-19 ENCOUNTER — Other Ambulatory Visit: Payer: Medicare Other

## 2018-09-19 DIAGNOSIS — E875 Hyperkalemia: Secondary | ICD-10-CM | POA: Diagnosis not present

## 2018-09-19 DIAGNOSIS — N182 Chronic kidney disease, stage 2 (mild): Secondary | ICD-10-CM | POA: Diagnosis not present

## 2018-09-19 DIAGNOSIS — I129 Hypertensive chronic kidney disease with stage 1 through stage 4 chronic kidney disease, or unspecified chronic kidney disease: Secondary | ICD-10-CM | POA: Diagnosis not present

## 2018-09-19 DIAGNOSIS — I10 Essential (primary) hypertension: Secondary | ICD-10-CM

## 2018-09-27 ENCOUNTER — Telehealth: Payer: Self-pay | Admitting: *Deleted

## 2018-09-27 NOTE — Telephone Encounter (Signed)
Per patient request to cancel 09/28/18 appts. He stated that he was out of town and that he would call back at a later date to R/S.

## 2018-09-28 ENCOUNTER — Inpatient Hospital Stay: Payer: Medicare Other | Admitting: Oncology

## 2018-09-28 ENCOUNTER — Inpatient Hospital Stay: Payer: Medicare Other

## 2018-10-28 ENCOUNTER — Other Ambulatory Visit: Payer: Self-pay | Admitting: Internal Medicine

## 2018-10-28 NOTE — Telephone Encounter (Signed)
Last OV 6/20 last fill 2/20 ok to fill?

## 2018-11-28 DIAGNOSIS — D225 Melanocytic nevi of trunk: Secondary | ICD-10-CM | POA: Diagnosis not present

## 2018-11-28 DIAGNOSIS — D2261 Melanocytic nevi of right upper limb, including shoulder: Secondary | ICD-10-CM | POA: Diagnosis not present

## 2018-11-28 DIAGNOSIS — L570 Actinic keratosis: Secondary | ICD-10-CM | POA: Diagnosis not present

## 2018-11-28 DIAGNOSIS — D2272 Melanocytic nevi of left lower limb, including hip: Secondary | ICD-10-CM | POA: Diagnosis not present

## 2018-11-28 DIAGNOSIS — D2262 Melanocytic nevi of left upper limb, including shoulder: Secondary | ICD-10-CM | POA: Diagnosis not present

## 2018-11-28 DIAGNOSIS — Z85828 Personal history of other malignant neoplasm of skin: Secondary | ICD-10-CM | POA: Diagnosis not present

## 2018-11-28 DIAGNOSIS — D2271 Melanocytic nevi of right lower limb, including hip: Secondary | ICD-10-CM | POA: Diagnosis not present

## 2018-12-12 ENCOUNTER — Encounter: Payer: Self-pay | Admitting: Internal Medicine

## 2019-01-02 DIAGNOSIS — H02834 Dermatochalasis of left upper eyelid: Secondary | ICD-10-CM | POA: Diagnosis not present

## 2019-01-02 DIAGNOSIS — H25013 Cortical age-related cataract, bilateral: Secondary | ICD-10-CM | POA: Diagnosis not present

## 2019-01-02 DIAGNOSIS — H2513 Age-related nuclear cataract, bilateral: Secondary | ICD-10-CM | POA: Diagnosis not present

## 2019-01-02 DIAGNOSIS — H524 Presbyopia: Secondary | ICD-10-CM | POA: Diagnosis not present

## 2019-01-02 DIAGNOSIS — H02831 Dermatochalasis of right upper eyelid: Secondary | ICD-10-CM | POA: Diagnosis not present

## 2019-02-13 ENCOUNTER — Encounter: Payer: Medicare Other | Admitting: Internal Medicine

## 2019-02-15 ENCOUNTER — Encounter: Payer: Medicare Other | Admitting: Internal Medicine

## 2019-02-20 ENCOUNTER — Ambulatory Visit (INDEPENDENT_AMBULATORY_CARE_PROVIDER_SITE_OTHER): Payer: Medicare Other | Admitting: Internal Medicine

## 2019-02-20 ENCOUNTER — Encounter: Payer: Self-pay | Admitting: Internal Medicine

## 2019-02-20 VITALS — BP 127/75 | Ht 73.0 in | Wt 208.0 lb

## 2019-02-20 DIAGNOSIS — Z0000 Encounter for general adult medical examination without abnormal findings: Secondary | ICD-10-CM | POA: Diagnosis not present

## 2019-02-20 DIAGNOSIS — E785 Hyperlipidemia, unspecified: Secondary | ICD-10-CM

## 2019-02-20 DIAGNOSIS — R419 Unspecified symptoms and signs involving cognitive functions and awareness: Secondary | ICD-10-CM | POA: Diagnosis not present

## 2019-02-20 DIAGNOSIS — I10 Essential (primary) hypertension: Secondary | ICD-10-CM

## 2019-02-20 DIAGNOSIS — R7301 Impaired fasting glucose: Secondary | ICD-10-CM | POA: Diagnosis not present

## 2019-02-20 DIAGNOSIS — Z8546 Personal history of malignant neoplasm of prostate: Secondary | ICD-10-CM | POA: Diagnosis not present

## 2019-02-20 NOTE — Progress Notes (Signed)
Virtual Visit via [email or web address]  Patient ID: Bobby Murillo, male    DOB: 07/25/1940  Age: 78 y.o. [name]: [id number]  This visit type was conducted due to national recommendations for restrictions regarding the COVID-19 pandemic (e.g. social distancing).  This format is felt to be most appropriate for this patient at this time.  All issues noted in this document were discussed and addressed.  No physical exam was performed (except for noted visual exam findings with Video Visits).   I connected with@ on 02/20/19 at 11:30 AM EST by a video enabled telemedicine application and verified that I am speaking with the correct person using two identifiers. Location patient: home Location provider: work or home office Persons participating in the virtual visit: patient, provider  I discussed the limitations, risks, security and privacy concerns of performing an evaluation and management service by telephone and the availability of in person appointments. I also discussed with the patient that there may be a patient responsible charge related to this service. The patient expressed understanding and agreed to proceed.  The patient is here for annual wellness examination and management of other chronic and acute problems.   The risk factors are reflected in the social history.  The roster of all physicians providing medical care to patient - is listed in the Snapshot section of the chart.  Activities of daily living:  The patient is 100% independent in all ADLs: dressing, toileting, feeding as well as independent mobility  Home safety : The patient has smoke detectors in the home. They wear seatbelts.  There are no firearms at home. There is no violence in the home.   There is no risks for hepatitis, STDs or HIV. There is no   history of blood transfusion. They have no travel history to infectious disease endemic areas of the world.  The patient has seen their dentist in the last six month. They have seen their  eye doctor in the last year. They admit to slight hearing difficulty with regard to whispered voices and some television programs.  They have deferred audiologic testing in the last year.  They do not  have excessive sun exposure. Discussed the need for sun protection: hats, long sleeves and use of sunscreen if there is significant sun exposure.   Diet: the importance of a healthy diet is discussed. They do have a healthy diet.  The benefits of regular aerobic exercise were discussed. He plays tennis twice weekly and was golfing twice weekly,  Until last week.     Depression screen: there are no signs or vegative symptoms of depression- irritability, change in appetite, anhedonia, sadness/tearfullness.  Cognitive assessment: the patient manages all their financial and personal affairs and is actively engaged. They could relate day,date,year and events; recalled 2/3 objects at 3 minutes; performed clock-face test normally.  The following portions of the patient's history were reviewed and updated as appropriate: allergies, current medications, past family history, past medical history,  past surgical history, past social history  and problem list.  Visual acuity was not assessed per patient preference since she has regular follow up with her ophthalmologist. Hearing and body mass index were assessed and reviewed.   During the course of the visit the patient was educated and counseled about appropriate screening and preventive services including : fall prevention , diabetes screening, nutrition counseling, colorectal cancer screening, and recommended immunizations.    CC: The primary encounter diagnosis was Encounter for preventive health examination. Diagnoses of Essential hypertension, History of prostate  cancer, Hyperlipidemia LDL goal <100, Cognitive complaints with normal neuropsychological exam, and Impaired fasting glucose were also pertinent to this visit.  78 yr old male with hypertension   HISTORY of prostate CA,  Hyperlipidemia, insomnia and joint pain   Sleeping well  Losing weight due to altered eating habits.  Less social drinking . Denies loss of appetite, nausea or abd pain.  Stools solid.   HTN:  Taking meds as directed.  bp taken 3 x in ten minutes with progressively lower readings : 150/81, 140/76,  And 127/75  Pulse in the high 40's    Joint pain migrates,  Currently endorsing pain in tendons of both feet,  ntermittently hurting on the plantar surface near the toes. Started after playing tennis     History Hanzel has a past medical history of Abnormal LFTs, Anxiety, Basal cell carcinoma (July 2012), Hyperlipidemia, prostate cancer (2010), and Sinus bradycardia by electrocardiogram.   He has a past surgical history that includes Prostatectomy (11/2008).   His family history includes Alzheimer's disease in his mother and sister; Heart disease (age of onset: 66) in his paternal uncle; Prostate cancer in his father; Stroke in his father and paternal aunt.He reports that he has never smoked. He has never used smokeless tobacco. He reports current alcohol use of about 8.0 standard drinks of alcohol per week. He reports that he does not use drugs.  Outpatient Medications Prior to Visit  Medication Sig Dispense Refill  . acetaminophen (TYLENOL) 325 MG tablet Take 650 mg by mouth every 6 (six) hours as needed.      . ALPRAZolam (XANAX) 0.5 MG tablet TAKE 1 TABLET BY MOUTH AT BEDTIME AS NEEDED FOR SLEEP 30 tablet 5  . amLODipine (NORVASC) 5 MG tablet Take 1 tablet (5 mg total) by mouth daily. 90 tablet 3  . aspirin 81 MG tablet Take 1 tablet by mouth daily. 1 tab by mouth daily    . ezetimibe (ZETIA) 10 MG tablet Take 1 tablet by mouth once daily 90 tablet 1  . Glucosamine-Chondroitin (GLUCOSAMINE CHONDR COMPLEX PO) Take 1 tablet by mouth daily. tab by mouth daily    . Lactulose 20 GM/30ML SOLN 30 ml every 4 hours until constipation is relieved 236 mL 3  . naproxen  (NAPROSYN) 250 MG tablet Take 250 mg by mouth as needed.      . polycarbophil (FIBERCON) 625 MG tablet Take 625 mg by mouth daily. Reported on 05/29/2015    . traMADol (ULTRAM) 50 MG tablet Take 1 tablet (50 mg total) by mouth every 6 (six) hours as needed. 30 tablet 0  . ciprofloxacin (CIPRO) 250 MG tablet Take 1 tablet (250 mg total) by mouth 2 (two) times daily. (Patient not taking: Reported on 02/20/2019) 28 tablet 0   No facility-administered medications prior to visit.    ROS: See pertinent positives and negatives per HPI.   Past Surgical History:  Procedure Laterality Date  . PROSTATECTOMY  11/2008   transurethral, radical, Dr. Darcus Austin    Family History  Problem Relation Age of Onset  . Alzheimer's disease Mother   . Stroke Father   . Prostate cancer Father   . Alzheimer's disease Sister   . Stroke Paternal Aunt   . Heart disease Paternal Uncle 67    SOCIAL HX:  reports that he has never smoked. He has never used smokeless tobacco. He reports current alcohol use of about 8.0 standard drinks of alcohol per week. He reports that he does not use drugs.  Current Outpatient Medications:  .  acetaminophen (TYLENOL) 325 MG tablet, Take 650 mg by mouth every 6 (six) hours as needed.  , Disp: , Rfl:  .  ALPRAZolam (XANAX) 0.5 MG tablet, TAKE 1 TABLET BY MOUTH AT BEDTIME AS NEEDED FOR SLEEP, Disp: 30 tablet, Rfl: 5 .  amLODipine (NORVASC) 5 MG tablet, Take 1 tablet (5 mg total) by mouth daily., Disp: 90 tablet, Rfl: 3 .  aspirin 81 MG tablet, Take 1 tablet by mouth daily. 1 tab by mouth daily, Disp: , Rfl:  .  ezetimibe (ZETIA) 10 MG tablet, Take 1 tablet by mouth once daily, Disp: 90 tablet, Rfl: 1 .  Glucosamine-Chondroitin (GLUCOSAMINE CHONDR COMPLEX PO), Take 1 tablet by mouth daily. tab by mouth daily, Disp: , Rfl:  .  Lactulose 20 GM/30ML SOLN, 30 ml every 4 hours until constipation is relieved, Disp: 236 mL, Rfl: 3 .  naproxen (NAPROSYN) 250 MG tablet, Take 250 mg by mouth as  needed.  , Disp: , Rfl:  .  polycarbophil (FIBERCON) 625 MG tablet, Take 625 mg by mouth daily. Reported on 05/29/2015, Disp: , Rfl:  .  traMADol (ULTRAM) 50 MG tablet, Take 1 tablet (50 mg total) by mouth every 6 (six) hours as needed., Disp: 30 tablet, Rfl: 0  EXAM:  VITALS per patient if applicable:  GENERAL: alert, oriented, appears well and in no acute distress  HEENT: atraumatic, conjunttiva clear, no obvious abnormalities on inspection of external nose and ears  NECK: normal movements of the head and neck  LUNGS: on inspection no signs of respiratory distress, breathing rate appears normal, no obvious gross SOB, gasping or wheezing  CV: no obvious cyanosis  MS: moves all visible extremities without noticeable abnormality  PSYCH/NEURO: pleasant and cooperative, no obvious depression or anxiety, speech and thought processing grossly intact  ASSESSMENT AND PLAN:  Discussed the following assessment and plan:  Encounter for preventive health examination  Essential hypertension - Plan: Comprehensive metabolic panel, Microalbumin / creatinine urine ratio  History of prostate cancer - Plan: PSA, Medicare, CANCELED: PSA, Medicare  Hyperlipidemia LDL goal <100 - Plan: Lipid panel  Cognitive complaints with normal neuropsychological exam - Plan: Vitamin B12, TSH, RPR, HIV antibody (with reflex), RPR, CANCELED: RPR  Impaired fasting glucose - Plan: Hemoglobin A1c  Encounter for preventive health examination age appropriate education and counseling updated, referrals for preventative services and immunizations addressed, dietary and smoking counseling addressed, most recent labs reviewed.  I have personally reviewed and have noted:  1) the patient's medical and social history 2) The pt's use of alcohol, tobacco, and illicit drugs 3) The patient's current medications and supplements 4) Functional ability including ADL's, fall risk, home safety risk, hearing and visual  impairment 5) Diet and physical activities 6) Evidence for depression or mood disorder 7) The patient's height, weight, and BMI have been recorded in the chart  I have made referrals, and provided counseling and education based on review of the above  Essential hypertension No changes today given readings which border on low coupled with sinus bradycardia  History of prostate cancer PSA was undetectable for years, followed by a rise, which was treated with XRT .  Annual PSA is due in November   Lab Results  Component Value Date   PSA 0.00 (L) 02/07/2018   PSA 0.00 (L) 02/01/2017   PSA 0.00 (L) 12/30/2015     Hyperlipidemia LDL goal <100 Tolerating Zetia.LDL lowered by nearly 100 pts. LFTs due .  He is  Statin intolerant/   Lab Results  Component Value Date   CHOL 216 (H) 08/17/2018   HDL 58.50 08/17/2018   LDLCALC 140 (H) 08/17/2018   LDLDIRECT 184.0 12/30/2015   TRIG 87.0 08/17/2018   CHOLHDL 4 08/17/2018   Lab Results  Component Value Date   ALT 29 08/17/2018   AST 25 08/17/2018   ALKPHOS 82 08/17/2018   BILITOT 1.2 08/17/2018     Cognitive complaints with normal neuropsychological exam Limited to remembering names,  Not details .  Checking for reversible deficiencies     I discussed the assessment and treatment plan with the patient. The patient was provided an opportunity to ask questions and all were answered. The patient agreed with the plan and demonstrated an understanding of the instructions.   The patient was advised to call back or seek an in-person evaluation if the symptoms worsen or if the condition fails to improve as anticipated.   Crecencio Mc, MD

## 2019-02-21 DIAGNOSIS — R419 Unspecified symptoms and signs involving cognitive functions and awareness: Secondary | ICD-10-CM | POA: Insufficient documentation

## 2019-02-21 NOTE — Assessment & Plan Note (Signed)
Tolerating Zetia.LDL lowered by nearly 100 pts. LFTs due .  He is   Statin intolerant/   Lab Results  Component Value Date   CHOL 216 (H) 08/17/2018   HDL 58.50 08/17/2018   LDLCALC 140 (H) 08/17/2018   LDLDIRECT 184.0 12/30/2015   TRIG 87.0 08/17/2018   CHOLHDL 4 08/17/2018   Lab Results  Component Value Date   ALT 29 08/17/2018   AST 25 08/17/2018   ALKPHOS 82 08/17/2018   BILITOT 1.2 08/17/2018

## 2019-02-21 NOTE — Assessment & Plan Note (Signed)
Limited to remembering names,  Not details .  Checking for reversible deficiencies

## 2019-02-21 NOTE — Assessment & Plan Note (Signed)
No changes today given readings which border on low coupled with sinus bradycardia

## 2019-02-21 NOTE — Assessment & Plan Note (Signed)
PSA was undetectable for years, followed by a rise, which was treated with XRT .  Annual PSA is due in November   Lab Results  Component Value Date   PSA 0.00 (L) 02/07/2018   PSA 0.00 (L) 02/01/2017   PSA 0.00 (L) 12/30/2015

## 2019-02-21 NOTE — Assessment & Plan Note (Signed)

## 2019-03-22 ENCOUNTER — Other Ambulatory Visit: Payer: Self-pay | Admitting: Internal Medicine

## 2019-04-06 ENCOUNTER — Ambulatory Visit (INDEPENDENT_AMBULATORY_CARE_PROVIDER_SITE_OTHER): Payer: Medicare Other

## 2019-04-06 ENCOUNTER — Other Ambulatory Visit: Payer: Self-pay

## 2019-04-06 ENCOUNTER — Ambulatory Visit: Payer: Medicare Other | Admitting: Internal Medicine

## 2019-04-06 VITALS — BP 146/78 | HR 57 | Ht 73.0 in | Wt 197.0 lb

## 2019-04-06 DIAGNOSIS — Z0000 Encounter for general adult medical examination without abnormal findings: Secondary | ICD-10-CM

## 2019-04-06 NOTE — Patient Instructions (Addendum)
  Bobby Murillo , Thank you for taking time to come for your Medicare Wellness Visit. I appreciate your ongoing commitment to your health goals. Please review the following plan we discussed and let me know if I can assist you in the future.   These are the goals we discussed: Goals      Patient Stated   . I want to maintain a healthy lifestyle (pt-stated)     Stay active       This is a list of the screening recommended for you and due dates:  Health Maintenance  Topic Date Due  . Flu Shot  06/14/2019*  . Tetanus Vaccine  10/16/2022  . Pneumonia vaccines  Completed  *Topic was postponed. The date shown is not the original due date.

## 2019-04-06 NOTE — Progress Notes (Signed)
Subjective:   Bobby Murillo is a 79 y.o. male who presents for Medicare Annual/Subsequent preventive examination.  Review of Systems:  No ROS.  Medicare Wellness Virtual Visit.  Visual/audio telehealth visit, UTA vital signs.   Ht/Wt provided. See social history for additional risk factors.  Cardiac Risk Factors include: advanced age (>52men, >24 women);male gender;hypertension     Objective:    Vitals: BP (!) 146/78   Pulse (!) 57   Ht 6\' 1"  (1.854 m)   Wt 197 lb (89.4 kg)   BMI 25.99 kg/m   Body mass index is 25.99 kg/m.  Advanced Directives 04/06/2019 04/04/2018 03/21/2018 02/21/2018 03/29/2017  Does Patient Have a Medical Advance Directive? Yes Yes Yes Yes Yes  Type of Paramedic of [place];Living will [place];Living will - - Living will;Healthcare Power of Attorney  Does patient want to make changes to medical advance directive? No - Patient declined No - Patient declined - - No - Patient declined  Copy of [place] in Chart? No - copy requested No - copy requested - - No - copy requested    Tobacco Social History   Tobacco Use  Smoking Status Never Smoker  Smokeless Tobacco Never Used     Counseling given: Not Answered   Clinical Intake:  Pre-visit preparation completed: Yes        Diabetes: No  How often do you need to have someone help you when you read instructions, pamphlets, or other written materials from your doctor or pharmacy?: 1 - Never  Interpreter Needed?: No     Past Medical History:  Diagnosis Date  . Abnormal LFTs   . Anxiety   . Basal cell carcinoma July 2012   left calf   . Hyperlipidemia   . prostate cancer 2010   Dr. Ronny Bacon  . Sinus bradycardia by electrocardiogram    Past Surgical History:  Procedure Laterality Date  . PROSTATECTOMY  11/2008   transurethral, radical, Dr. Darcus Austin   Family History  Problem Relation Age of Onset  . Alzheimer's disease Mother   .  Stroke Father   . Prostate cancer Father   . Alzheimer's disease Sister   . Stroke Paternal Aunt   . Heart disease Paternal Uncle 67   Social History   Socioeconomic History  . Marital status: Married    Spouse name: Not on file  . Number of children: Not on file  . Years of education: Not on file  . Highest education level: Not on file  Occupational History  . Not on file  Tobacco Use  . Smoking status: Never Smoker  . Smokeless tobacco: Never Used  Substance and Sexual Activity  . Alcohol use: Yes    Alcohol/week: 8.0 standard drinks    Types: 4 Glasses of wine, 4 Shots of liquor per week    Comment: occasional  . Drug use: No  . Sexual activity: Yes  Other Topics Concern  . Not on file  Social History Narrative  . Not on file   Social Determinants of Health   Financial Resource Strain:   . Difficulty of Paying Living Expenses: Not on file  Food Insecurity:   . Worried About Charity fundraiser in the Last Year: Not on file  . Ran Out of Food in the Last Year: Not on file  Transportation Needs:   . Lack of Transportation (Medical): Not on file  . Lack of Transportation (Non-Medical): Not on file  Physical Activity: Insufficiently Active  . Days of Exercise per Week: 1 day  . Minutes of Exercise per Session: 90 min  Stress:   . Feeling of Stress : Not on file  Social Connections: Unknown  . Frequency of Communication with Friends and Family: More than three times a week  . Frequency of Social Gatherings with Friends and Family: Once a week  . Attends Religious Services: Not on file  . Active Member of Clubs or Organizations: Yes  . Attends Archivist Meetings: Not on file  . Marital Status: Married    Outpatient Encounter Medications as of 04/06/2019  Medication Sig  . acetaminophen (TYLENOL) 325 MG tablet Take 650 mg by mouth every 6 (six) hours as needed.    . ALPRAZolam (XANAX) 0.5 MG tablet TAKE 1 TABLET BY MOUTH AT BEDTIME AS NEEDED FOR SLEEP    . amLODipine (NORVASC) 5 MG tablet Take 1 tablet (5 mg total) by mouth daily.  Marland Kitchen aspirin 81 MG tablet Take 1 tablet by mouth daily. 1 tab by mouth daily  . ezetimibe (ZETIA) 10 MG tablet Take 1 tablet by mouth once daily  . Glucosamine-Chondroitin (GLUCOSAMINE CHONDR COMPLEX PO) Take 1 tablet by mouth daily. tab by mouth daily  . Lactulose 20 GM/30ML SOLN 30 ml every 4 hours until constipation is relieved  . naproxen (NAPROSYN) 250 MG tablet Take 250 mg by mouth as needed.    . polycarbophil (FIBERCON) 625 MG tablet Take 625 mg by mouth daily. Reported on 05/29/2015  . traMADol (ULTRAM) 50 MG tablet Take 1 tablet (50 mg total) by mouth every 6 (six) hours as needed.   No facility-administered encounter medications on file as of 04/06/2019.    Activities of Daily Living In your present state of health, do you have any difficulty performing the following activities: 04/06/2019  Hearing? N  Vision? N  Difficulty concentrating or making decisions? N  Walking or climbing stairs? N  Dressing or bathing? N  Doing errands, shopping? N  Preparing Food and eating ? N  Using the Toilet? N  In the past six months, have you accidently leaked urine? N  Do you have problems with loss of bowel control? N  Managing your Medications? N  Managing your Finances? N  Housekeeping or managing your Housekeeping? N  Some recent data might be hidden    Patient Care Team: Crecencio Mc, MD as PCP - General (Internal Medicine)   Assessment:   This is a routine wellness examination for Bobby Murillo.  Nurse connected with patient 04/06/19 at  9:30 AM EST by a telephone enabled telemedicine application and verified that I am speaking with the correct person using two identifiers. Patient stated full name and DOB. Patient gave permission to continue with virtual visit. Patient's location was at home and Nurse's location was at [place] office.   Patient is alert and oriented x3. Patient denies difficulty focusing  or concentrating. Patient likes to read, play computer games, complete sodoku puzzles for brain stimulation.   Health Maintenance Due: See completed HM at the end of note.   Eye: Visual acuity not assessed. Virtual visit. Followed by their ophthalmologist.  Dental: Visits every 6 months.    Hearing: Demonstrates normal hearing during visit.  Safety:  Patient feels safe at home- yes Patient does have smoke detectors at home- yes Patient does wear sunscreen or protective clothing when in direct sunlight - yes Patient does wear seat belt when in a moving vehicle - yes  Patient drives- yes Adequate lighting in walkways free from debris- yes Grab bars and handrails used as appropriate- yes Ambulates with an assistive device- no Cell phone on person when ambulating outside of the home- yes  Social: Alcohol intake - yes, occasional  Smoking history- never  Smokers in home? none Illicit drug use? none  Medication: Taking as directed and without issues.  Self managed - yes   Covid-19: Precautions and sickness symptoms discussed. Wears mask, social distancing, hand hygiene as appropriate.   Activities of Daily Living Patient denies needing assistance with: household chores, feeding themselves, getting from bed to chair, getting to the toilet, bathing/showering, dressing, managing money, or preparing meals.   Discussed the importance of a healthy diet, water intake and the benefits of aerobic exercise.   Physical activity- golf/tennis once a week  Diet:  Regular Water: good intake Caffeine: 2 cups of coffee  Other Providers Patient Care Team: Crecencio Mc, MD as PCP - General (Internal Medicine)  Exercise Activities and Dietary recommendations Current Exercise Habits: Home exercise routine, Time (Minutes): > 60, Frequency (Times/Week): 1, Weekly Exercise (Minutes/Week): 0, Intensity: Moderate  Goals      Patient Stated   . I want to maintain a healthy lifestyle  (pt-stated)     Stay active       Fall Risk Fall Risk  04/06/2019 02/20/2019 04/04/2018 03/29/2017 02/01/2017  Falls in the past year? 0 0 0 No Yes  Number falls in past yr: - - - - 1  Injury with Fall? - - - No No  Risk for fall due to : - - - - -  Risk for fall due to: Comment - - - - -  Follow up Falls evaluation completed Falls evaluation completed - - -   Timed Get Up and Go Performed: no, virtual visit  Depression Screen PHQ 2/9 Scores 04/06/2019 04/04/2018 03/29/2017 02/01/2017  PHQ - 2 Score 0 0 0 0  PHQ- 9 Score - - 0 1    Cognitive Function     6CIT Screen 04/06/2019 04/04/2018 03/29/2017  What Year? 0 points 0 points 0 points  What month? 0 points 0 points 0 points  What time? 0 points 0 points 0 points  Count back from 20 0 points 0 points 0 points  Months in reverse 0 points 0 points 0 points  Repeat phrase 0 points 0 points 0 points  Total Score 0 0 0    Immunization History  Administered Date(s) Administered  . Influenza Split 02/13/2011  . Influenza, High Dose Seasonal PF 12/30/2015, 01/07/2018  . Influenza,inj,Quad PF,6+ Mos 11/08/2013, 11/30/2014, 03/29/2017  . Influenza-Unspecified 12/30/2012  . PFIZER SARS-COV-2 Vaccination 03/20/2019  . Pneumococcal Conjugate-13 11/08/2013  . Pneumococcal Polysaccharide-23 02/13/2011, 02/07/2018  . Tdap 10/15/2012    Screening Tests Health Maintenance  Topic Date Due  . INFLUENZA VACCINE  06/14/2019 (Originally 10/15/2018)  . TETANUS/TDAP  10/16/2022  . PNA vac Low Risk Adult  Completed       Plan:   Keep all routine maintenance appointments.   Fasting labs - 05/08/19 @ 9:45  Medicare Attestation I have personally reviewed: The patient's medical and social history Their use of alcohol, tobacco or illicit drugs Their current medications and supplements The patient's functional ability including ADLs,fall risks, home safety risks, cognitive, and hearing and visual impairment Diet and physical  activities Evidence for depression   I have reviewed and discussed with patient certain preventive protocols, quality metrics, and best practice recommendations.  Varney Biles, LPN  [phone]

## 2019-05-08 ENCOUNTER — Other Ambulatory Visit (INDEPENDENT_AMBULATORY_CARE_PROVIDER_SITE_OTHER): Payer: Medicare Other

## 2019-05-08 ENCOUNTER — Other Ambulatory Visit: Payer: Self-pay

## 2019-05-08 DIAGNOSIS — I10 Essential (primary) hypertension: Secondary | ICD-10-CM

## 2019-05-08 DIAGNOSIS — R7301 Impaired fasting glucose: Secondary | ICD-10-CM | POA: Diagnosis not present

## 2019-05-08 DIAGNOSIS — Z8546 Personal history of malignant neoplasm of prostate: Secondary | ICD-10-CM

## 2019-05-08 DIAGNOSIS — E785 Hyperlipidemia, unspecified: Secondary | ICD-10-CM

## 2019-05-08 DIAGNOSIS — R419 Unspecified symptoms and signs involving cognitive functions and awareness: Secondary | ICD-10-CM

## 2019-05-08 LAB — HEMOGLOBIN A1C: Hgb A1c MFr Bld: 5.9 % (ref 4.6–6.5)

## 2019-05-08 LAB — MICROALBUMIN / CREATININE URINE RATIO
Creatinine,U: 130.9 mg/dL
Microalb Creat Ratio: 0.5 mg/g (ref 0.0–30.0)
Microalb, Ur: <0.7 mg/dL (ref 0.0–1.9)

## 2019-05-08 LAB — PSA, MEDICARE: PSA: 0 ng/ml — ABNORMAL LOW (ref 0.10–4.00)

## 2019-05-08 LAB — TSH: TSH: 4.03 u[IU]/mL (ref 0.35–4.50)

## 2019-05-08 LAB — VITAMIN B12: Vitamin B-12: 966 pg/mL — ABNORMAL HIGH (ref 211–911)

## 2019-05-09 LAB — COMPREHENSIVE METABOLIC PANEL
ALT: 27 U/L (ref 0–53)
AST: 23 U/L (ref 0–37)
Albumin: 4.1 g/dL (ref 3.5–5.2)
Alkaline Phosphatase: 97 U/L (ref 39–117)
BUN: 22 mg/dL (ref 6–23)
CO2: 24 mEq/L (ref 19–32)
Calcium: 9.7 mg/dL (ref 8.4–10.5)
Chloride: 106 mEq/L (ref 96–112)
Creatinine, Ser: 1.42 mg/dL (ref 0.40–1.50)
GFR: 48.36 mL/min — ABNORMAL LOW (ref >60.00)
Glucose, Bld: 99 mg/dL (ref 70–99)
Potassium: 4.1 mEq/L (ref 3.5–5.1)
Sodium: 139 mEq/L (ref 135–145)
Total Bilirubin: 1.2 mg/dL (ref 0.2–1.2)
Total Protein: 6.8 g/dL (ref 6.0–8.3)

## 2019-05-09 LAB — LIPID PANEL
Cholesterol: 229 mg/dL — ABNORMAL HIGH (ref 0–200)
HDL: 67.6 mg/dL (ref >39.00)
LDL Cholesterol: 137 mg/dL — ABNORMAL HIGH (ref 0–99)
NonHDL: 161.83
Total CHOL/HDL Ratio: 3
Triglycerides: 126 mg/dL (ref 0.0–149.0)
VLDL: 25.2 mg/dL (ref 0.0–40.0)

## 2019-05-09 LAB — RPR: RPR Ser Ql: NONREACTIVE

## 2019-05-11 ENCOUNTER — Other Ambulatory Visit: Payer: Self-pay | Admitting: Internal Medicine

## 2019-05-11 DIAGNOSIS — N1831 Chronic kidney disease, stage 3a: Secondary | ICD-10-CM

## 2019-06-09 ENCOUNTER — Telehealth: Payer: Self-pay | Admitting: Internal Medicine

## 2019-06-09 LAB — HEMOGLOBIN A1C: Hemoglobin A1C: 5.8

## 2019-06-09 NOTE — Telephone Encounter (Signed)
Arville Lime NP with [facility] did a screening A1C on patient and it was 5.8. She said they like to report their findings with pt's pcp.

## 2019-06-09 NOTE — Telephone Encounter (Signed)
FYI

## 2019-06-12 NOTE — Telephone Encounter (Signed)
Would you please abstract?  Thank you

## 2019-06-12 NOTE — Telephone Encounter (Signed)
Abstracted

## 2019-06-16 ENCOUNTER — Other Ambulatory Visit: Payer: Self-pay | Admitting: Internal Medicine

## 2019-06-17 NOTE — Telephone Encounter (Signed)
Refill request for xanax., last seen 02/20/2019, last filled 10/28/2018.  Please advise.

## 2019-08-07 ENCOUNTER — Other Ambulatory Visit: Payer: Self-pay

## 2019-08-07 ENCOUNTER — Encounter: Payer: Self-pay | Admitting: Internal Medicine

## 2019-08-07 ENCOUNTER — Ambulatory Visit (INDEPENDENT_AMBULATORY_CARE_PROVIDER_SITE_OTHER): Payer: Medicare Other | Admitting: Internal Medicine

## 2019-08-07 VITALS — BP 150/82 | HR 51 | Temp 97.5°F | Resp 16 | Ht 73.0 in | Wt 205.4 lb

## 2019-08-07 DIAGNOSIS — N1831 Chronic kidney disease, stage 3a: Secondary | ICD-10-CM | POA: Diagnosis not present

## 2019-08-07 DIAGNOSIS — I10 Essential (primary) hypertension: Secondary | ICD-10-CM | POA: Diagnosis not present

## 2019-08-07 DIAGNOSIS — M130 Polyarthritis, unspecified: Secondary | ICD-10-CM | POA: Diagnosis not present

## 2019-08-07 DIAGNOSIS — M170 Bilateral primary osteoarthritis of knee: Secondary | ICD-10-CM | POA: Diagnosis not present

## 2019-08-07 DIAGNOSIS — M2022 Hallux rigidus, left foot: Secondary | ICD-10-CM | POA: Diagnosis not present

## 2019-08-07 LAB — RENAL FUNCTION PANEL
Albumin: 4.1 g/dL (ref 3.5–5.2)
BUN: 23 mg/dL (ref 6–23)
CO2: 23 mEq/L (ref 19–32)
Calcium: 9.3 mg/dL (ref 8.4–10.5)
Chloride: 107 mEq/L (ref 96–112)
Creatinine, Ser: 1.4 mg/dL (ref 0.40–1.50)
GFR: 49.12 mL/min — ABNORMAL LOW (ref >60.00)
Glucose, Bld: 93 mg/dL (ref 70–99)
Phosphorus: 2.6 mg/dL (ref 2.3–4.6)
Potassium: 4.1 mEq/L (ref 3.5–5.1)
Sodium: 138 mEq/L (ref 135–145)

## 2019-08-07 NOTE — Patient Instructions (Addendum)
Your blood pressure is too high.    Increase amlodipine to 10 mg daily.  Goal BP is 130/80  If you develop CANKLES  Let me know and we will change the treatment    You can use turmeric in capsule form for management of mild joint pain   The treatment dose is 500-600 mg every 12 hours .   Referral to [name] for your left foot pain is in progress

## 2019-08-07 NOTE — Assessment & Plan Note (Addendum)
Secondary to bone spurring and DJD.  He has  pain which is aggravated by activity and non use of NSAIDs due to worsening GFR. Referral to Podiatry for evaluation

## 2019-08-07 NOTE — Assessment & Plan Note (Signed)
Suggested by last GFR.  Suspect NSAID/HTN related.  Repeat pending;  If GR remains > 50 ml /min will refer to Nephrology

## 2019-08-07 NOTE — Assessment & Plan Note (Signed)
He has no signs of systemic disease,  Only OA and DJD.  Miserable since stopping NSAIDs.  Will return for I/A steroid injections of bilateral knees.  Advised to try turmeric and continue topical voltaren not more than every other day .

## 2019-08-07 NOTE — Assessment & Plan Note (Signed)
His has had more pain since stopping Aleve/Motrin.  He will return for bilateral I/A steroid injections

## 2019-08-07 NOTE — Assessment & Plan Note (Signed)
Not at goal based on home reports as well as office reading.  Advised to increase amlodipine dose to 10 mg daily.  If not tolerated,  Will add ARB  Lab Results  Component Value Date   MICROALBUR <0.7 05/08/2019     Lab Results  Component Value Date   NA 139 05/08/2019   K 4.1 05/08/2019   CL 106 05/08/2019   CO2 24 05/08/2019   Lab Results  Component Value Date   CREATININE 1.42 05/08/2019

## 2019-08-07 NOTE — Progress Notes (Signed)
Subjective:  Patient ID: Bobby Murillo, male    DOB: Nov 22, 1940  Age: 79 y.o. [name]: [id number]  CC: The primary encounter diagnosis was Stage 3a chronic kidney disease. Diagnoses of Rigidity of 1st MTP joint, left, Essential hypertension, Polyarthritis, and Primary osteoarthritis of both knees were also pertinent to this visit.  HPI Bobby Murillo presents for follow up  This visit occurred during the SARS-CoV-2 public health emergency.  Safety protocols were in place, including screening questions prior to the visit, additional usage of staff PPE, and extensive cleaning of exam room while observing appropriate contact time as indicated for disinfecting solutions.    Patient has received both doses of the [place] 19 vaccine without complications.  Patient continues to mask when outside of the home except when walking in yard or at safe distances from others .  Patient denies any change in mood or development of unhealthy behaviors resuting from the pandemic's restriction of activities and socialization.     CKD :   No prior workup.  Has used NSAIDS regularly for orthopedic issues in the past.  Has eliminated ibuprofen since last visit due to elevated GFR. Joint pain has been much worse and he has been unable to exercise as much.   Still playing gold and tennis but not as well.   Using tart cherry supplement for the past week  And topical voltaren once daily 3 or 4 times per week after golf/ for bilateral  knee pain,   Has not found any relief with 1000 mg tylenol  1 `  HTN:  Home readings done sporadically and over 130/80   Left foot first  MT  OA   Has intermittent pain  .  [facility] would not treat.  Discussed [name] referral   Message from wife [name]:  Right foot still hurting   Outpatient Medications Prior to Visit  Medication Sig Dispense Refill  . acetaminophen (TYLENOL) 325 MG tablet Take 650 mg by mouth every 6 (six) hours as needed.      . ALPRAZolam (XANAX) 0.5 MG  tablet TAKE 1 TABLET BY MOUTH AT BEDTIME AS NEEDED FOR SLEEP 30 tablet 4  . amLODipine (NORVASC) 5 MG tablet Take 1 tablet (5 mg total) by mouth daily. 90 tablet 3  . aspirin 81 MG tablet Take 1 tablet by mouth daily. 1 tab by mouth daily    . ezetimibe (ZETIA) 10 MG tablet Take 1 tablet by mouth once daily 90 tablet 0  . Glucosamine-Chondroitin (GLUCOSAMINE CHONDR COMPLEX PO) Take 1 tablet by mouth daily. tab by mouth daily    . polycarbophil (FIBERCON) 625 MG tablet Take 625 mg by mouth daily. Reported on 05/29/2015    . Lactulose 20 GM/30ML SOLN 30 ml every 4 hours until constipation is relieved (Patient not taking: Reported on 08/07/2019) 236 mL 3  . traMADol (ULTRAM) 50 MG tablet Take 1 tablet (50 mg total) by mouth every 6 (six) hours as needed. (Patient not taking: Reported on 08/07/2019) 30 tablet 0   No facility-administered medications prior to visit.    Review of Systems;  Patient denies headache, fevers, malaise, unintentional weight loss, skin rash, eye pain, sinus congestion and sinus pain, sore throat, dysphagia,  hemoptysis , cough, dyspnea, wheezing, chest pain, palpitations, orthopnea, edema, abdominal pain, nausea, melena, diarrhea, constipation, flank pain, dysuria, hematuria, urinary  Frequency, nocturia, numbness, tingling, seizures,  Focal weakness, Loss of consciousness,  Tremor, insomnia, depression, anxiety, and suicidal ideation.  Objective:  BP (!) 150/82 (BP Location: Left Arm, Patient Position: Sitting, Cuff Size: Normal)   Pulse (!) 51   Temp (!) 97.5 F (36.4 C) (Temporal)   Resp 16   Ht 6\' 1"  (1.854 m)   Wt 205 lb 6.4 oz (93.2 kg)   SpO2 97%   BMI 27.10 kg/m   BP Readings from Last 3 Encounters:  08/07/19 (!) 150/82  04/06/19 (!) 146/78  02/20/19 127/75    Wt Readings from Last 3 Encounters:  08/07/19 205 lb 6.4 oz (93.2 kg)  04/06/19 197 lb (89.4 kg)  02/20/19 208 lb (94.3 kg)    General appearance: alert, cooperative and appears stated  age Ears: normal TM's and external ear canals both ears Throat: lips, mucosa, and tongue normal; teeth and gums normal Neck: no adenopathy, no carotid bruit, supple, symmetrical, trachea midline and thyroid not enlarged, symmetric, no tenderness/mass/nodules Back: symmetric, no curvature. ROM normal. No CVA tenderness. Lungs: clear to auscultation bilaterally Heart: regular rate and rhythm, S1, S2 normal, no murmur, click, rub or gallop Abdomen: soft, non-tender; bowel sounds normal; no masses,  no organomegaly Pulses: 2+ and symmetric Skin: Skin color, texture, turgor normal. No rashes or lesions Lymph nodes: Cervical, supraclavicular, and axillary nodes normal.  Lab Results  Component Value Date   HGBA1C 5.8 06/09/2019   HGBA1C 5.9 05/08/2019    Lab Results  Component Value Date   CREATININE 1.42 05/08/2019   CREATININE 1.51 (H) 09/09/2018   CREATININE 2.24 (H) 09/02/2018    Lab Results  Component Value Date   WBC 9.6 09/02/2018   HGB 14.9 09/02/2018   HCT 44.6 09/02/2018   PLT 317.0 09/02/2018   GLUCOSE 99 05/08/2019   CHOL 229 (H) 05/08/2019   TRIG 126.0 05/08/2019   HDL 67.60 05/08/2019   LDLDIRECT 184.0 12/30/2015   LDLCALC 137 (H) 05/08/2019   ALT 27 05/08/2019   AST 23 05/08/2019   NA 139 05/08/2019   K 4.1 05/08/2019   CL 106 05/08/2019   CREATININE 1.42 05/08/2019   BUN 22 05/08/2019   CO2 24 05/08/2019   TSH 4.03 05/08/2019   PSA 0.00 (L) 05/08/2019   HGBA1C 5.8 06/09/2019   MICROALBUR <0.7 05/08/2019    CT RENAL STONE STUDY  Result Date: 09/05/2018 CLINICAL DATA:  Left lower quadrant pain since last week. Microscopic hematuria. History of prostate cancer. EXAM: CT ABDOMEN AND PELVIS WITHOUT CONTRAST TECHNIQUE: Multidetector CT imaging of the abdomen and pelvis was performed following the standard protocol without IV contrast. COMPARISON:  Report only from abdominopelvic CT 10/10/2003 FINDINGS: Lower chest: There is a small calcified right lower lobe  granuloma. The lung bases are otherwise clear. There is no pleural effusion. Coronary artery calcifications are noted. Hepatobiliary: The liver appears unremarkable as imaged in the noncontrast state. No evidence of gallstones, gallbladder wall thickening or biliary dilatation. Pancreas: Unremarkable. No pancreatic ductal dilatation or surrounding inflammatory changes. Spleen: Normal in size without focal abnormality. Adrenals/Urinary Tract: Both adrenal glands appear normal. There is mild dilatation of the left renal pelvis and left ureter. There is mild perinephric soft tissue stranding. No urinary tract calculi are demonstrated. The right collecting system is not dilated. The bladder appears normal. Stomach/Bowel: No evidence of bowel wall thickening, distention or surrounding inflammatory change. The appendix appears normal. Vascular/Lymphatic: There are no enlarged abdominal or pelvic lymph nodes. Mild aortic and branch vessel atherosclerosis. Reproductive: Postsurgical changes in the pelvis consistent with prostatectomy. No evidence of pelvic mass. Other: Postsurgical changes in  the low anterior abdominal wall. Small umbilical hernia containing only fat. No ascites or peritoneal nodularity. Musculoskeletal: No acute or significant osseous findings. There are bilateral L5 pars defects with a minimal anterolisthesis and mild left foraminal narrowing at L5-S1. Moderately advanced degenerative changes are present at L3-4. IMPRESSION: 1. Nonspecific mild left-sided hydronephrosis and hydroureter without evidence of urinary tract calculus. This could be secondary to a recently passed calculus. Clinical follow-up recommended. 2. No evidence of metastatic prostate cancer post prostatectomy. 3. Coronary and Aortic Atherosclerosis (ICD10-I70.0). 4. Bilateral L5 pars defects. Electronically Signed   By: Richardean Sale M.D.   On: 09/05/2018 11:51    Assessment & Plan:   Problem List Items Addressed This Visit       Unprioritized   Degenerative joint disease of knee    His has had more pain since stopping Aleve/Motrin.  He will return for bilateral I/A steroid injections       Essential hypertension    Not at goal based on home reports as well as office reading.  Advised to increase amlodipine dose to 10 mg daily.  If not tolerated,  Will add ARB  Lab Results  Component Value Date   MICROALBUR <0.7 05/08/2019     Lab Results  Component Value Date   NA 139 05/08/2019   K 4.1 05/08/2019   CL 106 05/08/2019   CO2 24 05/08/2019   Lab Results  Component Value Date   CREATININE 1.42 05/08/2019         Polyarthritis    He has no signs of systemic disease,  Only OA and DJD.  Miserable since stopping NSAIDs.  Will return for I/A steroid injections of bilateral knees.  Advised to try turmeric and continue topical voltaren not more than every other day .      Rigidity of 1st MTP joint, left    Secondary to bone spurring and DJD.  He has  pain which is aggravated by activity and non use of NSAIDs due to worsening GFR. Referral to Podiatry for evaluation       Relevant Orders   Ambulatory referral to Podiatry   Stage 3a chronic kidney disease - Primary    Suggested by last GFR.  Suspect NSAID/HTN related.  Repeat pending;  If GR remains > 50 ml /min will refer to Nephrology      Relevant Orders   Renal function panel     I provided  30 minutes of  face-to-face time during this encounter reviewing patient's current problems and past surgeries, labs and imaging studies, providing counseling on the above mentioned problems , and coordination  of care .  I am having Shonte Kump. Arcos maintain his acetaminophen, polycarbophil, Glucosamine-Chondroitin (GLUCOSAMINE CHONDR COMPLEX PO), aspirin, traMADol, amLODipine, Lactulose, ALPRAZolam, and ezetimibe.  No orders of the defined types were placed in this encounter.   There are no discontinued medications.  Follow-up: No follow-ups on  file.   Crecencio Mc, MD

## 2019-08-15 DIAGNOSIS — M2021 Hallux rigidus, right foot: Secondary | ICD-10-CM | POA: Diagnosis not present

## 2019-08-15 DIAGNOSIS — M2022 Hallux rigidus, left foot: Secondary | ICD-10-CM | POA: Diagnosis not present

## 2019-08-16 ENCOUNTER — Other Ambulatory Visit: Payer: Self-pay

## 2019-08-16 ENCOUNTER — Other Ambulatory Visit: Payer: Self-pay | Admitting: Internal Medicine

## 2019-08-18 ENCOUNTER — Other Ambulatory Visit: Payer: Self-pay

## 2019-08-18 ENCOUNTER — Encounter: Payer: Self-pay | Admitting: Internal Medicine

## 2019-08-18 ENCOUNTER — Ambulatory Visit (INDEPENDENT_AMBULATORY_CARE_PROVIDER_SITE_OTHER): Payer: Medicare Other | Admitting: Internal Medicine

## 2019-08-18 VITALS — BP 150/80 | HR 81 | Temp 97.9°F | Ht 72.99 in | Wt 206.2 lb

## 2019-08-18 DIAGNOSIS — M2022 Hallux rigidus, left foot: Secondary | ICD-10-CM

## 2019-08-18 DIAGNOSIS — M170 Bilateral primary osteoarthritis of knee: Secondary | ICD-10-CM | POA: Diagnosis not present

## 2019-08-18 DIAGNOSIS — M25561 Pain in right knee: Secondary | ICD-10-CM | POA: Diagnosis not present

## 2019-08-18 DIAGNOSIS — M25562 Pain in left knee: Secondary | ICD-10-CM

## 2019-08-18 MED ORDER — TRIAMCINOLONE ACETONIDE 40 MG/ML IJ SUSP
40.0000 mg | Freq: Once | INTRAMUSCULAR | Status: AC
  Administered 2019-08-18: 40 mg via INTRA_ARTICULAR

## 2019-08-18 MED ORDER — LIDOCAINE HCL 1 % IJ SOLN
4.0000 mL | Freq: Once | INTRAMUSCULAR | Status: AC
  Administered 2019-08-18: 4 mL

## 2019-08-18 NOTE — Assessment & Plan Note (Signed)
Bilateral. Since his last visit he was treated by Dr Vickki Muff with IA steroid injection and his pain has improved considerably

## 2019-08-18 NOTE — Patient Instructions (Signed)
Knee Injection Post procedure Care   Follow these instructions at home: Medicines  Take over-the-counter and prescription medicines only as told by your doctor.  Do not drive or use heavy machinery while taking prescription pain medicine.  Do not take medicines such as aspirin and ibuprofen unless your health care provider tells you to take them. Injection site care  Follow instructions from your health care provider about: ? How to take care of your puncture site. ? When and how you should change your dressing. ? When you should remove your dressing.  Check your injection area every day for signs of infection. Check for: ? More redness, swelling, or pain after 2 days. ? Fluid or blood. ? Pus or a bad smell. ? Warmth. Managing pain, stiffness, and swelling   If directed, put ice on the injection area: ? Put ice in a plastic bag. ? Place a towel between your skin and the bag. ? Leave the ice on for 20 minutes, 2-3 times per day.  Do not apply heat to your knee.  Raise (elevate) the injection area above the level of your heart while you are sitting or lying down. General instructions  If you were given a dressing, keep it dry until your health care provider says it can be removed. Ask your health care provider when you can start showering or taking a bath.  Avoid strenuous activities for as long as directed by your health care provider. Ask your health care provider when you can return to your normal activities.  Keep all follow-up visits as told by your health care provider. This is important. You may need more injections. Contact a health care provider if you have:  A fever.  Warmth in your injection area.  Fluid, blood, or pus coming from your injection site.  Symptoms at your injection site that last longer than 2 days after your procedure. Get help right away if:  Your knee: ? Turns very red. ? Becomes very swollen. ? Is in severe pain. Summary  A knee  injection is a procedure to get medicine into your knee joint to relieve the pain, swelling, and stiffness of arthritis.  A needle is carefully placed between your kneecap and your knee to inject medicine into the joint space.  Before the procedure, ask your health care provider about changing or stopping your regular medicines, especially if you are taking diabetes medicines or blood thinners.  Contact your health care provider if you have any problems or questions after your procedure. This information is not intended to replace advice given to you by your health care provider. Make sure you discuss any questions you have with your health care provider. Document Revised: 03/22/2017 Document Reviewed: 03/22/2017 Elsevier Patient Education  [place].

## 2019-08-18 NOTE — Assessment & Plan Note (Addendum)
Bilateral.   Informed consent for bilateral knee joint injections obtained.  Both knees were cleaned with betadine.  Medial side of  knee joint was accessed with great difficult due to loss of joint space.  Both knees were injected with 1 ml  (40 mg )  Kenalog and 4 ml Xylocaine .  Procedures were  tolerated well and knees were feeling less painful by the time he left the office.  he was advised to limit activity to walking short distances for the next 48 hours , apply ice packs every 6 hours for 15 minutes, advised to call if he develops signs of infection

## 2019-08-18 NOTE — Progress Notes (Signed)
Subjective:  Patient ID: Bobby Murillo, male    DOB: [birth year]  Age: 79 y.o. [name]: [id number]  CC: The primary encounter diagnosis was Acute pain of both knees. Diagnoses of Rigidity of 1st MTP joint, left and Primary osteoarthritis of both knees were also pertinent to this visit.  HPI Bobby Murillo presents for bilateral  I/A knee injections.  Patient has advanced bilateral DJD .  He denies any recent episodes of joint redness and warmth   This visit occurred during the SARS-CoV-2 public health emergency.  Safety protocols were in place, including screening questions prior to the visit, additional usage of staff PPE, and extensive cleaning of exam room while observing appropriate contact time as indicated for disinfecting solutions.    Patient has received both doses of the available COVID 19 vaccine without complications.  Patient continues to mask when outside of the home except when walking in yard or at safe distances from others .  Patient denies any change in mood or development of unhealthy behaviors resuting from the pandemic's restriction of activities and socialization.       Outpatient Medications Prior to Visit  Medication Sig Dispense Refill  . acetaminophen (TYLENOL) 325 MG tablet Take 650 mg by mouth every 6 (six) hours as needed.      . ALPRAZolam (XANAX) 0.5 MG tablet TAKE 1 TABLET BY MOUTH AT BEDTIME AS NEEDED FOR SLEEP 30 tablet 4  . amLODipine (NORVASC) 5 MG tablet Take 1 tablet by mouth once daily 90 tablet 0  . aspirin 81 MG tablet Take 1 tablet by mouth daily. 1 tab by mouth daily    . ezetimibe (ZETIA) 10 MG tablet Take 1 tablet by mouth once daily 90 tablet 0  . Glucosamine-Chondroitin (GLUCOSAMINE CHONDR COMPLEX PO) Take 1 tablet by mouth daily. tab by mouth daily    . polycarbophil (FIBERCON) 625 MG tablet Take 625 mg by mouth daily. Reported on 05/29/2015    . traMADol (ULTRAM) 50 MG tablet Take 1 tablet (50 mg total) by mouth every 6 (six) hours as needed. 30  tablet 0  . Lactulose 20 GM/30ML SOLN 30 ml every 4 hours until constipation is relieved (Patient not taking: Reported on 08/07/2019) 236 mL 3   No facility-administered medications prior to visit.    Review of Systems;  Patient denies headache, fevers, malaise, unintentional weight loss, skin rash, eye pain, sinus congestion and sinus pain, sore throat, dysphagia,  hemoptysis , cough, dyspnea, wheezing, chest pain, palpitations, orthopnea, edema, abdominal pain, nausea, melena, diarrhea, constipation, flank pain, dysuria, hematuria, urinary  Frequency, nocturia, numbness, tingling, seizures,  Focal weakness, Loss of consciousness,  Tremor, insomnia, depression, anxiety, and suicidal ideation.      Objective:  BP (!) 150/80 (BP Location: Left Arm, Patient Position: Sitting)   Pulse 81   Temp 97.9 F (36.6 C)   Ht 6' 0.99" (1.854 m)   Wt 206 lb 3.2 oz (93.5 kg)   SpO2 96%   BMI 27.21 kg/m   BP Readings from Last 3 Encounters:  08/18/19 (!) 150/80  08/07/19 (!) 150/82  04/06/19 (!) 146/78    Wt Readings from Last 3 Encounters:  08/18/19 206 lb 3.2 oz (93.5 kg)  08/07/19 205 lb 6.4 oz (93.2 kg)  04/06/19 197 lb (89.4 kg)    General appearance: alert, cooperative and appears stated age Lungs: clear to auscultation bilaterally Heart: regular rate and rhythm, S1, S2 normal, no murmur, click, rub or gallop Pulses: 2+ and symmetric MSK:  Bilateral DJD without effusions , redness or warmth  Skin: Skin color, texture, turgor normal. No rashes or lesions Lymph nodes: Cervical, supraclavicular, and axillary nodes normal.  Lab Results  Component Value Date   HGBA1C 5.8 06/09/2019   HGBA1C 5.9 05/08/2019    Lab Results  Component Value Date   CREATININE 1.40 08/07/2019   CREATININE 1.42 05/08/2019   CREATININE 1.51 (H) 09/09/2018    Lab Results  Component Value Date   WBC 9.6 09/02/2018   HGB 14.9 09/02/2018   HCT 44.6 09/02/2018   PLT 317.0 09/02/2018   GLUCOSE 93  08/07/2019   CHOL 229 (H) 05/08/2019   TRIG 126.0 05/08/2019   HDL 67.60 05/08/2019   LDLDIRECT 184.0 12/30/2015   LDLCALC 137 (H) 05/08/2019   ALT 27 05/08/2019   AST 23 05/08/2019   NA 138 08/07/2019   K 4.1 08/07/2019   CL 107 08/07/2019   CREATININE 1.40 08/07/2019   BUN 23 08/07/2019   CO2 23 08/07/2019   TSH 4.03 05/08/2019   PSA 0.00 (L) 05/08/2019   HGBA1C 5.8 06/09/2019   MICROALBUR <0.7 05/08/2019    CT RENAL STONE STUDY  Result Date: 09/05/2018 CLINICAL DATA:  Left lower quadrant pain since last week. Microscopic hematuria. History of prostate cancer. EXAM: CT ABDOMEN AND PELVIS WITHOUT CONTRAST TECHNIQUE: Multidetector CT imaging of the abdomen and pelvis was performed following the standard protocol without IV contrast. COMPARISON:  Report only from abdominopelvic CT 10/10/2003 FINDINGS: Lower chest: There is a small calcified right lower lobe granuloma. The lung bases are otherwise clear. There is no pleural effusion. Coronary artery calcifications are noted. Hepatobiliary: The liver appears unremarkable as imaged in the noncontrast state. No evidence of gallstones, gallbladder wall thickening or biliary dilatation. Pancreas: Unremarkable. No pancreatic ductal dilatation or surrounding inflammatory changes. Spleen: Normal in size without focal abnormality. Adrenals/Urinary Tract: Both adrenal glands appear normal. There is mild dilatation of the left renal pelvis and left ureter. There is mild perinephric soft tissue stranding. No urinary tract calculi are demonstrated. The right collecting system is not dilated. The bladder appears normal. Stomach/Bowel: No evidence of bowel wall thickening, distention or surrounding inflammatory change. The appendix appears normal. Vascular/Lymphatic: There are no enlarged abdominal or pelvic lymph nodes. Mild aortic and branch vessel atherosclerosis. Reproductive: Postsurgical changes in the pelvis consistent with prostatectomy. No evidence of  pelvic mass. Other: Postsurgical changes in the low anterior abdominal wall. Small umbilical hernia containing only fat. No ascites or peritoneal nodularity. Musculoskeletal: No acute or significant osseous findings. There are bilateral L5 pars defects with a minimal anterolisthesis and mild left foraminal narrowing at L5-S1. Moderately advanced degenerative changes are present at L3-4. IMPRESSION: 1. Nonspecific mild left-sided hydronephrosis and hydroureter without evidence of urinary tract calculus. This could be secondary to a recently passed calculus. Clinical follow-up recommended. 2. No evidence of metastatic prostate cancer post prostatectomy. 3. Coronary and Aortic Atherosclerosis (ICD10-I70.0). 4. Bilateral L5 pars defects. Electronically Signed   By: Richardean Sale M.D.   On: 09/05/2018 11:51    Assessment & Plan:   Problem List Items Addressed This Visit      Unprioritized   Degenerative joint disease of knee    Bilateral.   Informed consent for bilateral knee joint injections obtained.  Both knees were cleaned with betadine.  Medial side of  knee joint was accessed with great difficult due to loss of joint space.  Both knees were injected with 1 ml  (40 mg )  Kenalog  and 4 ml Xylocaine .  Procedures were  tolerated well and knees were feeling less painful by the time he left the office.  he was advised to limit activity to walking short distances for the next 48 hours , apply ice packs every 6 hours for 15 minutes, advised to call if he develops signs of infection        Rigidity of 1st MTP joint, left    Bilateral. Since his last visit he was treated by Dr Vickki Muff with IA steroid injection and his pain has improved considerably        Other Visit Diagnoses    Acute pain of both knees    -  Primary   Relevant Medications   triamcinolone acetonide (KENALOG-40) injection 40 mg (Completed) (Start on 08/18/2019  5:30 PM)   triamcinolone acetonide (KENALOG-40) injection 40 mg (Completed)  (Start on 08/18/2019  5:30 PM)   lidocaine (XYLOCAINE) 1 % (with pres) injection 4 mL (Completed) (Start on 08/18/2019  5:30 PM)   lidocaine (XYLOCAINE) 1 % (with pres) injection 4 mL (Completed) (Start on 08/18/2019  5:30 PM)      I have discontinued Caroll Rancher. Goble's Lactulose. I am also having him maintain his acetaminophen, polycarbophil, Glucosamine-Chondroitin (GLUCOSAMINE CHONDR COMPLEX PO), aspirin, traMADol, ALPRAZolam, ezetimibe, and amLODipine. We administered triamcinolone acetonide, triamcinolone acetonide, lidocaine, and lidocaine.  Meds ordered this encounter  Medications  . triamcinolone acetonide (KENALOG-40) injection 40 mg  . triamcinolone acetonide (KENALOG-40) injection 40 mg  . lidocaine (XYLOCAINE) 1 % (with pres) injection 4 mL  . lidocaine (XYLOCAINE) 1 % (with pres) injection 4 mL    Medications Discontinued During This Encounter  Medication Reason  . Lactulose 20 GM/30ML SOLN Completed Course    Follow-up: No follow-ups on file.   Crecencio Mc, MD

## 2019-08-30 DIAGNOSIS — D225 Melanocytic nevi of trunk: Secondary | ICD-10-CM | POA: Diagnosis not present

## 2019-08-30 DIAGNOSIS — D2261 Melanocytic nevi of right upper limb, including shoulder: Secondary | ICD-10-CM | POA: Diagnosis not present

## 2019-08-30 DIAGNOSIS — D2262 Melanocytic nevi of left upper limb, including shoulder: Secondary | ICD-10-CM | POA: Diagnosis not present

## 2019-08-30 DIAGNOSIS — L570 Actinic keratosis: Secondary | ICD-10-CM | POA: Diagnosis not present

## 2019-08-30 DIAGNOSIS — C44622 Squamous cell carcinoma of skin of right upper limb, including shoulder: Secondary | ICD-10-CM | POA: Diagnosis not present

## 2019-08-30 DIAGNOSIS — D485 Neoplasm of uncertain behavior of skin: Secondary | ICD-10-CM | POA: Diagnosis not present

## 2019-08-30 DIAGNOSIS — D2271 Melanocytic nevi of right lower limb, including hip: Secondary | ICD-10-CM | POA: Diagnosis not present

## 2019-08-30 DIAGNOSIS — Z85828 Personal history of other malignant neoplasm of skin: Secondary | ICD-10-CM | POA: Diagnosis not present

## 2019-09-19 ENCOUNTER — Other Ambulatory Visit: Payer: Self-pay | Admitting: Internal Medicine

## 2019-10-01 ENCOUNTER — Other Ambulatory Visit: Payer: Self-pay | Admitting: Internal Medicine

## 2019-10-11 DIAGNOSIS — C44622 Squamous cell carcinoma of skin of right upper limb, including shoulder: Secondary | ICD-10-CM | POA: Diagnosis not present

## 2019-10-11 DIAGNOSIS — C44629 Squamous cell carcinoma of skin of left upper limb, including shoulder: Secondary | ICD-10-CM | POA: Diagnosis not present

## 2019-10-23 MED ORDER — AMLODIPINE BESYLATE 10 MG PO TABS
10.0000 mg | ORAL_TABLET | Freq: Every day | ORAL | 1 refills | Status: DC
Start: 2019-10-23 — End: 2019-12-15

## 2019-12-15 ENCOUNTER — Ambulatory Visit (INDEPENDENT_AMBULATORY_CARE_PROVIDER_SITE_OTHER): Payer: Medicare Other

## 2019-12-15 ENCOUNTER — Ambulatory Visit (INDEPENDENT_AMBULATORY_CARE_PROVIDER_SITE_OTHER): Payer: Medicare Other | Admitting: Internal Medicine

## 2019-12-15 ENCOUNTER — Encounter: Payer: Self-pay | Admitting: Internal Medicine

## 2019-12-15 ENCOUNTER — Other Ambulatory Visit: Payer: Self-pay

## 2019-12-15 VITALS — BP 150/80 | HR 57 | Temp 97.3°F | Ht 72.99 in | Wt 207.0 lb

## 2019-12-15 DIAGNOSIS — M25562 Pain in left knee: Secondary | ICD-10-CM

## 2019-12-15 DIAGNOSIS — M25471 Effusion, right ankle: Secondary | ICD-10-CM

## 2019-12-15 DIAGNOSIS — Z23 Encounter for immunization: Secondary | ICD-10-CM | POA: Diagnosis not present

## 2019-12-15 DIAGNOSIS — R600 Localized edema: Secondary | ICD-10-CM

## 2019-12-15 DIAGNOSIS — M25472 Effusion, left ankle: Secondary | ICD-10-CM

## 2019-12-15 DIAGNOSIS — M170 Bilateral primary osteoarthritis of knee: Secondary | ICD-10-CM

## 2019-12-15 DIAGNOSIS — I10 Essential (primary) hypertension: Secondary | ICD-10-CM

## 2019-12-15 DIAGNOSIS — G8929 Other chronic pain: Secondary | ICD-10-CM

## 2019-12-15 DIAGNOSIS — M1712 Unilateral primary osteoarthritis, left knee: Secondary | ICD-10-CM | POA: Diagnosis not present

## 2019-12-15 LAB — MICROALBUMIN / CREATININE URINE RATIO
Creatinine,U: 80.9 mg/dL
Microalb Creat Ratio: 0.9 mg/g (ref 0.0–30.0)
Microalb, Ur: <0.7 mg/dL (ref 0.0–1.9)

## 2019-12-15 LAB — BASIC METABOLIC PANEL
BUN: 22 mg/dL (ref 6–23)
CO2: 23 mEq/L (ref 19–32)
Calcium: 9.5 mg/dL (ref 8.4–10.5)
Chloride: 107 mEq/L (ref 96–112)
Creatinine, Ser: 1.18 mg/dL (ref 0.40–1.50)
GFR: 59.78 mL/min — ABNORMAL LOW (ref >60.00)
Glucose, Bld: 74 mg/dL (ref 70–99)
Potassium: 4.2 mEq/L (ref 3.5–5.1)
Sodium: 139 mEq/L (ref 135–145)

## 2019-12-15 LAB — BRAIN NATRIURETIC PEPTIDE: Pro B Natriuretic peptide (BNP): 76 pg/mL (ref 0.0–100.0)

## 2019-12-15 MED ORDER — LOSARTAN POTASSIUM 50 MG PO TABS: 50.0000 mg | ORAL_TABLET | Freq: Every day | ORAL | 0 refills | Status: DC

## 2019-12-15 NOTE — Progress Notes (Signed)
Subjective:  Patient ID: Bobby Murillo, male    DOB: 1940/04/15  Age: 79 y.o. [name]: [id number]  CC: The primary encounter diagnosis was Need for immunization against influenza. Diagnoses of Chronic pain of left knee, Hypertension, unspecified type, Lower extremity edema, Essential hypertension, Ankle edema, bilateral, and Primary osteoarthritis of both knees were also pertinent to this visit.  HPI Bobby Murillo presents for EVALUATION OF new onset fluid retention  in lower extremities at the ankle level   This visit occurred during the SARS-CoV-2 public health emergency.  Safety protocols were in place, including screening questions prior to the visit, additional usage of staff PPE, and extensive cleaning of exam room while observing appropriate contact time as indicated for disinfecting solutions.   1) bilateral knee pain , chronic,  But still able to play couples tennis and golf   2) swollen ankles .  Usually symmetric, Pitting, usually gone by morning.  He has a history of ankle trauma in high school. He also takes amlodipine, and noticed the edema after an unintended 3 week lapse in medication due to refill bening denied by staff.   He denies orthopnea,  Plays tennis and golf regularly without shortness of breath. . He has started limiting salt a month ago and is checking BP at home.  Home BP readings have been 709 to 628 systolic/80  Using [employer] monitor.  Notices a lack of precision with measurements     Outpatient Medications Prior to Visit  Medication Sig Dispense Refill  . acetaminophen (TYLENOL) 325 MG tablet Take 650 mg by mouth every 6 (six) hours as needed.      . ALPRAZolam (XANAX) 0.5 MG tablet TAKE 1 TABLET BY MOUTH AT BEDTIME AS NEEDED FOR SLEEP 30 tablet 4  . aspirin 81 MG tablet Take 1 tablet by mouth daily. 1 tab by mouth daily    . ezetimibe (ZETIA) 10 MG tablet Take 1 tablet by mouth once daily 90 tablet 3  . Glucosamine-Chondroitin (GLUCOSAMINE CHONDR COMPLEX PO) Take 1  tablet by mouth daily. tab by mouth daily    . polycarbophil (FIBERCON) 625 MG tablet Take 625 mg by mouth daily. Reported on 05/29/2015    . amLODipine (NORVASC) 10 MG tablet Take 1 tablet (10 mg total) by mouth daily. 90 tablet 1  . traMADol (ULTRAM) 50 MG tablet Take 1 tablet (50 mg total) by mouth every 6 (six) hours as needed. 30 tablet 0   No facility-administered medications prior to visit.    Review of Systems;  Patient denies headache, fevers, malaise, unintentional weight loss, skin rash, eye pain, sinus congestion and sinus pain, sore throat, dysphagia,  hemoptysis , cough, dyspnea, wheezing, chest pain, palpitations, orthopnea, abdominal pain, nausea, melena, diarrhea, constipation, flank pain, dysuria, hematuria, urinary  Frequency, nocturia, numbness, tingling, seizures,  Focal weakness, Loss of consciousness,  Tremor, insomnia, depression, anxiety, and suicidal ideation.      Objective:  BP (!) 150/80 (BP Location: Left Arm, Patient Position: Sitting)   Pulse (!) 57   Temp (!) 97.3 F (36.3 C)   Ht 6' 0.99" (1.854 m)   Wt 207 lb (93.9 kg)   SpO2 97%   BMI 27.32 kg/m   BP Readings from Last 3 Encounters:  12/15/19 (!) 150/80  08/18/19 (!) 150/80  08/07/19 (!) 150/82    Wt Readings from Last 3 Encounters:  12/15/19 207 lb (93.9 kg)  08/18/19 206 lb 3.2 oz (93.5 kg)  08/07/19 205 lb 6.4 oz (93.2 kg)  General appearance: alert, cooperative and appears stated age Ears: normal TM's and external ear canals both ears Throat: lips, mucosa, and tongue normal; teeth and gums normal Neck: no adenopathy, no carotid bruit, supple, symmetrical, trachea midline and thyroid not enlarged, symmetric, no tenderness/mass/nodules Back: symmetric, no curvature. ROM normal. No CVA tenderness. Lungs: clear to auscultation bilaterally Heart: regular rate and rhythm, S1, S2 normal, no murmur, click, rub or gallop Abdomen: soft, non-tender; bowel sounds normal; no masses,  no  organomegaly MSK bilateral knee crepitus without warmth, redness or effusions.  Pulses: 2+ and symmetric Skin: Skin color, texture, 1+ pitting edema bilaterally at ankle level . No rashes or lesions Lymph nodes: Cervical, supraclavicular, and axillary nodes normal.  Lab Results  Component Value Date   HGBA1C 5.8 06/09/2019   HGBA1C 5.9 05/08/2019    Lab Results  Component Value Date   CREATININE 1.18 12/15/2019   CREATININE 1.40 08/07/2019   CREATININE 1.42 05/08/2019    Lab Results  Component Value Date   WBC 9.6 09/02/2018   HGB 14.9 09/02/2018   HCT 44.6 09/02/2018   PLT 317.0 09/02/2018   GLUCOSE 74 12/15/2019   CHOL 229 (H) 05/08/2019   TRIG 126.0 05/08/2019   HDL 67.60 05/08/2019   LDLDIRECT 184.0 12/30/2015   LDLCALC 137 (H) 05/08/2019   ALT 27 05/08/2019   AST 23 05/08/2019   NA 139 12/15/2019   K 4.2 12/15/2019   CL 107 12/15/2019   CREATININE 1.18 12/15/2019   BUN 22 12/15/2019   CO2 23 12/15/2019   TSH 4.03 05/08/2019   PSA 0.00 (L) 05/08/2019   HGBA1C 5.8 06/09/2019   MICROALBUR <0.7 12/15/2019    CT RENAL STONE STUDY  Result Date: 09/05/2018 CLINICAL DATA:  Left lower quadrant pain since last week. Microscopic hematuria. History of prostate cancer. EXAM: CT ABDOMEN AND PELVIS WITHOUT CONTRAST TECHNIQUE: Multidetector CT imaging of the abdomen and pelvis was performed following the standard protocol without IV contrast. COMPARISON:  Report only from abdominopelvic CT 10/10/2003 FINDINGS: Lower chest: There is a small calcified right lower lobe granuloma. The lung bases are otherwise clear. There is no pleural effusion. Coronary artery calcifications are noted. Hepatobiliary: The liver appears unremarkable as imaged in the noncontrast state. No evidence of gallstones, gallbladder wall thickening or biliary dilatation. Pancreas: Unremarkable. No pancreatic ductal dilatation or surrounding inflammatory changes. Spleen: Normal in size without focal abnormality.  Adrenals/Urinary Tract: Both adrenal glands appear normal. There is mild dilatation of the left renal pelvis and left ureter. There is mild perinephric soft tissue stranding. No urinary tract calculi are demonstrated. The right collecting system is not dilated. The bladder appears normal. Stomach/Bowel: No evidence of bowel wall thickening, distention or surrounding inflammatory change. The appendix appears normal. Vascular/Lymphatic: There are no enlarged abdominal or pelvic lymph nodes. Mild aortic and branch vessel atherosclerosis. Reproductive: Postsurgical changes in the pelvis consistent with prostatectomy. No evidence of pelvic mass. Other: Postsurgical changes in the low anterior abdominal wall. Small umbilical hernia containing only fat. No ascites or peritoneal nodularity. Musculoskeletal: No acute or significant osseous findings. There are bilateral L5 pars defects with a minimal anterolisthesis and mild left foraminal narrowing at L5-S1. Moderately advanced degenerative changes are present at L3-4. IMPRESSION: 1. Nonspecific mild left-sided hydronephrosis and hydroureter without evidence of urinary tract calculus. This could be secondary to a recently passed calculus. Clinical follow-up recommended. 2. No evidence of metastatic prostate cancer post prostatectomy. 3. Coronary and Aortic Atherosclerosis (ICD10-I70.0). 4. Bilateral L5 pars defects. Electronically  Signed   By: Richardean Sale M.D.   On: 09/05/2018 11:51    Assessment & Plan:   Problem List Items Addressed This Visit      Unprioritized   Ankle edema, bilateral    Exam notes spider veins on both ankles,  And 1+ pitting edema bilaterally.  Likely due to VI aggravated by use of amlodipine.  CHF and nephrotic syndrome unlikley given BNP < 100 and normal microalb/cr ratio.  Stopping amlodipine .  Losartan started         Degenerative joint disease of knee    He has severe OA with tricompartmental degenerative changes.        Essential hypertension    Changing medication from amlodipine to losartan 50 mg daily.   Return in one week for RN visit for BP check,  and BMET       Relevant Medications   losartan (COZAAR) 50 MG tablet   Other Relevant Orders   Basic metabolic panel    Other Visit Diagnoses    Need for immunization against influenza    -  Primary   Relevant Orders   Flu Vaccine QUAD High Dose(Fluad) (Completed)   Chronic pain of left knee       Relevant Orders   DG Knee Complete 4 Views Left (Completed)   Hypertension, unspecified type       Relevant Medications   losartan (COZAAR) 50 MG tablet   Other Relevant Orders   Microalbumin / creatinine urine ratio (Completed)   Basic metabolic panel (Completed)   Lower extremity edema       Relevant Orders   B Nat Peptide (Completed)    A total of 40 minutes was spent with patient more than half of which was spent in counseling patient on the above mentioned issues , reviewing and explaining labs, and coordination of care.  I have discontinued Shyler Holzman. Sybert's traMADol and amLODipine. I am also having him start on losartan. Additionally, I am having him maintain his acetaminophen, polycarbophil, Glucosamine-Chondroitin (GLUCOSAMINE CHONDR COMPLEX PO), aspirin, ALPRAZolam, and ezetimibe.  Meds ordered this encounter  Medications  . losartan (COZAAR) 50 MG tablet    Sig: Take 1 tablet (50 mg total) by mouth daily.    Dispense:  90 tablet    Refill:  0    Medications Discontinued During This Encounter  Medication Reason  . traMADol (ULTRAM) 50 MG tablet Completed Course  . amLODipine (NORVASC) 10 MG tablet     Follow-up: No follow-ups on file.   Crecencio Mc, MD

## 2019-12-15 NOTE — Patient Instructions (Addendum)
Stop the amlodipine and start 50 mg losartan once daily  Return in one  week for BP  Check (BRING YOuR METER)  AND a  REPEAT BLOOD TEST   NSAIDS WILL AGGRAVATE FLUID RETENTION AND BP!

## 2019-12-17 DIAGNOSIS — M25471 Effusion, right ankle: Secondary | ICD-10-CM | POA: Insufficient documentation

## 2019-12-17 DIAGNOSIS — M25472 Effusion, left ankle: Secondary | ICD-10-CM | POA: Insufficient documentation

## 2019-12-17 NOTE — Assessment & Plan Note (Addendum)
Changing medication from amlodipine to losartan 50 mg daily.   Return in one week for RN visit for BP check,  and BMET

## 2019-12-17 NOTE — Assessment & Plan Note (Signed)
He has severe OA with tricompartmental degenerative changes.

## 2019-12-17 NOTE — Assessment & Plan Note (Addendum)
Exam notes spider veins on both ankles,  And 1+ pitting edema bilaterally.  Likely due to VI aggravated by use of amlodipine.  CHF and nephrotic syndrome unlikley given BNP < 100 and normal microalb/cr ratio.  Stopping amlodipine .  Losartan started

## 2019-12-17 NOTE — Progress Notes (Signed)
Your Labs are normal,   so  congestive heart failure and nephrotic syndrome are not the cause..  The fluid is most likely aggravated by use of amlodipine .  If it does not improve with the medication change to losartan, more workup will be done.   A repeat bmet is needed this Friday. I will have the office call you with an RN and lab appointment (to measure blood pressure and recheck renal function)  2)  you have severe degenerative changes in the knee.  Let me know if you want to see an orthopedist

## 2019-12-22 ENCOUNTER — Other Ambulatory Visit (INDEPENDENT_AMBULATORY_CARE_PROVIDER_SITE_OTHER): Payer: Medicare Other

## 2019-12-22 ENCOUNTER — Other Ambulatory Visit: Payer: Self-pay

## 2019-12-22 DIAGNOSIS — I10 Essential (primary) hypertension: Secondary | ICD-10-CM | POA: Diagnosis not present

## 2019-12-22 NOTE — Addendum Note (Signed)
Addended by: Leeanne Rio on: 12/22/2019 02:33 PM   Modules accepted: Orders

## 2019-12-23 LAB — BASIC METABOLIC PANEL
BUN/Creatinine Ratio: 18 (calc) (ref 6–22)
BUN: 23 mg/dL (ref 7–25)
CO2: 23 mmol/L (ref 20–32)
Calcium: 9.7 mg/dL (ref 8.6–10.3)
Chloride: 107 mmol/L (ref 98–110)
Creat: 1.26 mg/dL — ABNORMAL HIGH (ref 0.70–1.18)
Glucose, Bld: 158 mg/dL — ABNORMAL HIGH (ref 65–99)
Potassium: 3.9 mmol/L (ref 3.5–5.3)
Sodium: 138 mmol/L (ref 135–146)

## 2019-12-24 ENCOUNTER — Telehealth: Payer: Self-pay | Admitting: Internal Medicine

## 2019-12-24 DIAGNOSIS — I10 Essential (primary) hypertension: Secondary | ICD-10-CM

## 2019-12-25 MED ORDER — LOSARTAN POTASSIUM 50 MG PO TABS
50.0000 mg | ORAL_TABLET | Freq: Every day | ORAL | 0 refills | Status: DC
Start: 2019-12-25 — End: 2020-03-11

## 2019-12-25 NOTE — Assessment & Plan Note (Deleted)
GFR dropped with change from amlodipine to losartan .  Med stopped.  Amlodipine resumed and  referral to vascular surgery advised to rule out RAS

## 2020-01-02 ENCOUNTER — Other Ambulatory Visit: Payer: Self-pay

## 2020-01-02 ENCOUNTER — Ambulatory Visit (INDEPENDENT_AMBULATORY_CARE_PROVIDER_SITE_OTHER): Payer: Medicare Other

## 2020-01-02 VITALS — BP 129/67 | HR 70

## 2020-01-02 DIAGNOSIS — I10 Essential (primary) hypertension: Secondary | ICD-10-CM

## 2020-01-02 NOTE — Progress Notes (Signed)
Patient is here for a BP check due to bp being high at last visit, as per patient.  Currently patients BP is 129/67 and BPM is 70.  Patient has no complaints of headaches, blurry vision, chest pain, arm pain, light headedness, dizziness, and nor jaw pain. Please see previous note for order.

## 2020-01-29 DIAGNOSIS — L821 Other seborrheic keratosis: Secondary | ICD-10-CM | POA: Diagnosis not present

## 2020-01-29 DIAGNOSIS — X32XXXA Exposure to sunlight, initial encounter: Secondary | ICD-10-CM | POA: Diagnosis not present

## 2020-01-29 DIAGNOSIS — D225 Melanocytic nevi of trunk: Secondary | ICD-10-CM | POA: Diagnosis not present

## 2020-01-29 DIAGNOSIS — L570 Actinic keratosis: Secondary | ICD-10-CM | POA: Diagnosis not present

## 2020-02-06 ENCOUNTER — Other Ambulatory Visit: Payer: Self-pay | Admitting: Internal Medicine

## 2020-02-07 NOTE — Telephone Encounter (Signed)
Ls:08-18-19 LO: 06-17-19

## 2020-03-01 DIAGNOSIS — R059 Cough, unspecified: Secondary | ICD-10-CM

## 2020-03-04 DIAGNOSIS — R059 Cough, unspecified: Secondary | ICD-10-CM | POA: Diagnosis not present

## 2020-03-04 NOTE — Telephone Encounter (Signed)
Spoke with pt and scheduled him for a covid test and an virtual visit with Dr. Olivia Mackie.

## 2020-03-04 NOTE — Addendum Note (Signed)
Addended by: Adair Laundry on: 03/04/2020 02:12 PM   Modules accepted: Orders

## 2020-03-06 ENCOUNTER — Telehealth: Payer: Medicare Other | Admitting: Internal Medicine

## 2020-03-06 ENCOUNTER — Encounter: Payer: Self-pay | Admitting: Internal Medicine

## 2020-03-06 ENCOUNTER — Telehealth (INDEPENDENT_AMBULATORY_CARE_PROVIDER_SITE_OTHER): Payer: Medicare Other | Admitting: Internal Medicine

## 2020-03-06 ENCOUNTER — Telehealth: Payer: Medicare Other | Admitting: Family

## 2020-03-06 ENCOUNTER — Other Ambulatory Visit: Payer: Self-pay

## 2020-03-06 VITALS — Ht 73.0 in | Wt 205.0 lb

## 2020-03-06 DIAGNOSIS — R059 Cough, unspecified: Secondary | ICD-10-CM

## 2020-03-06 DIAGNOSIS — J309 Allergic rhinitis, unspecified: Secondary | ICD-10-CM

## 2020-03-06 DIAGNOSIS — J302 Other seasonal allergic rhinitis: Secondary | ICD-10-CM | POA: Diagnosis not present

## 2020-03-06 DIAGNOSIS — R0982 Postnasal drip: Secondary | ICD-10-CM | POA: Diagnosis not present

## 2020-03-06 LAB — SARS-COV-2, NAA 2 DAY TAT

## 2020-03-06 LAB — POCT INFLUENZA A/B
Influenza A, POC: NEGATIVE
Influenza B, POC: NEGATIVE

## 2020-03-06 LAB — NOVEL CORONAVIRUS, NAA: SARS-CoV-2, NAA: NOT DETECTED

## 2020-03-06 MED ORDER — IPRATROPIUM BROMIDE 0.06 % NA SOLN: 2.0000 | Freq: Four times a day (QID) | NASAL | 12 refills | Status: DC

## 2020-03-06 NOTE — Progress Notes (Signed)
Onset of symptoms a week ago. Runny nose, cough, and clear mucous.   No one around the Patient is sick and he is not around any children.  Was tested on Tuesday for [place] and it was negative.

## 2020-03-06 NOTE — Progress Notes (Signed)
Virtual Visit via Video Note  I connected with Bobby Murillo   on 03/06/20 at  4:30 PM EST by a video enabled telemedicine application and verified that I am speaking with the correct person using two identifiers.  Location patient: home, [place] Location provider:work or home office Persons participating in the virtual visit: patient, provider pts wife   I discussed the limitations of evaluation and management by telemedicine and the availability of in person appointments. The patient expressed understanding and agreed to proceed.   HPI: Every year around this time he has allergies but ? To what the trigger is having PND, cough, runny nose denies sinus pressure, sob, body aches, fever, energy ok. No h/o asthma. Cough is dry cough deep in chest and he does a cough purposefully due to PND. covid test negative this week. He is feeling better than a few days ago tried [facility] claritin but this made runny nose worse.    ROS: See pertinent positives and negatives per HPI.  Past Medical History:  Diagnosis Date  . Abnormal LFTs   . Anxiety   . Basal cell carcinoma July 2012   left calf   . Hyperlipidemia   . prostate cancer 2010   Dr. Baxter Flattery  . Sinus bradycardia by electrocardiogram     Past Surgical History:  Procedure Laterality Date  . PROSTATECTOMY  11/2008   transurethral, radical, Dr. Carin Primrose     Current Outpatient Medications:  .  acetaminophen (TYLENOL) 325 MG tablet, Take 650 mg by mouth every 6 (six) hours as needed., Disp: , Rfl:  .  ALPRAZolam (XANAX) 0.5 MG tablet, TAKE 1 TABLET BY MOUTH AT BEDTIME AS NEEDED FOR SLEEP (Patient taking differently: Take 0.25 mg by mouth at bedtime as needed.), Disp: 30 tablet, Rfl: 5 .  Ascorbic Acid (VITAMIN C PO), Take by mouth., Disp: , Rfl:  .  aspirin 81 MG tablet, Take 1 tablet by mouth daily. 1 tab by mouth daily, Disp: , Rfl:  .  Cyanocobalamin (B-12 PO), Take by mouth., Disp: , Rfl:  .  ezetimibe (ZETIA) 10 MG tablet, Take 1 tablet by  mouth once daily, Disp: 90 tablet, Rfl: 3 .  Glucosamine-Chondroitin (GLUCOSAMINE CHONDR COMPLEX PO), Take 1 tablet by mouth daily. tab by mouth daily, Disp: , Rfl:  .  losartan (COZAAR) 50 MG tablet, Take 1 tablet (50 mg total) by mouth daily., Disp: 90 tablet, Rfl: 0 .  polycarbophil (FIBERCON) 625 MG tablet, Take 625 mg by mouth daily. Reported on 05/29/2015, Disp: , Rfl:  .  TURMERIC PO, Take by mouth., Disp: , Rfl:  .  ipratropium (ATROVENT) 0.06 % nasal spray, Place 2 sprays into both nostrils 4 (four) times daily. prn, Disp: 15 mL, Rfl: 12  EXAM:  VITALS per patient if applicable:  GENERAL: alert, oriented, appears well and in no acute distress  HEENT: atraumatic, conjunttiva clear, no obvious abnormalities on inspection of external nose and ears  NECK: normal movements of the head and neck  LUNGS: on inspection no signs of respiratory distress, breathing rate appears normal, no obvious gross SOB, gasping or wheezing  CV: no obvious cyanosis  MS: moves all visible extremities without noticeable abnormality  PSYCH/NEURO: pleasant and cooperative, no obvious depression or anxiety, speech and thought processing grossly intact  ASSESSMENT AND PLAN:  Discussed the following assessment and plan:  Seasonal allergies - Plan: ipratropium (ATROVENT) 0.06 % nasal spray PND Allergic rhinitis, unspecified seasonality, unspecified trigger - Plan: ipratropium (ATROVENT) 0.06 % nasal spray  up to qid prn  Cough - Plan: POCT Influenza A/B not performed 03/04/20, Novel Coronavirus, NAA ([facility]) negative  Cough could be related to allergies no alarm sx's  F/u with pcp in person in 2-3 months chronic health concerns Pt needs CBC, CMET with GFR, lipid, A1C, TSH, UA h/o blood in urine   -we discussed possible serious and likely etiologies, options for evaluation and workup, limitations of telemedicine visit vs in person visit, treatment, treatment risks and precautions.   I discussed the  assessment and treatment plan with the patient. The patient was provided an opportunity to ask questions and all were answered. The patient agreed with the plan and demonstrated an understanding of the instructions.    Time spent 20 min Delorise Jackson, MD

## 2020-03-06 NOTE — Patient Instructions (Addendum)
Ipratropium nasal spray What is this medicine? IPRATROPIUM (i pra TROE pee um) is used to relieve a runny nose due to seasonal allergies or non allergic causes, like a cold. This medicine does not help with nasal congestion or sneezing. This medicine may be used for other purposes; ask your health care provider or pharmacist if you have questions. COMMON BRAND NAME(S): Atrovent What should I tell my health care provider before I take this medicine? They need to know if you have any of these conditions:  bladder problems, difficulty passing urine  glaucoma  prostate trouble  an unusual or allergic reaction to ipratropium, atropine, bromides, soya flour or protein, soybeans or peanuts, peanut oil, other medicines, foods, dyes, or preservatives  pregnant or trying to get pregnant  breast-feeding How should I use this medicine? This medicine is for use only in the nose. Follow the directions on the prescription label. Do not use more often than directed. Do not share this medicine with anyone else. Make sure that you are using your nasal spray correctly. Ask you doctor or health care provider if you have any questions. Talk to your pediatrician regarding the use of this medicine in children. While this drug may be prescribed for children as young as 63 years of age for selected conditions, precautions do apply. Overdosage: If you think you have taken too much of this medicine contact a poison control center or emergency room at once. NOTE: This medicine is only for you. Do not share this medicine with others. What if I miss a dose? If you miss a dose, use it as soon as you can. If it is almost time for your next dose, use only that dose. Do not use double or extra doses. What may interact with this medicine?  atropine, hyoscyamine, and related medications  medicines for motion sickness or dizziness  medicines for overactive bladder  some medicines for colds  some medicines for stomach  problems This list may not describe all possible interactions. Give your health care provider a list of all the medicines, herbs, non-prescription drugs, or dietary supplements you use. Also tell them if you smoke, drink alcohol, or use illegal drugs. Some items may interact with your medicine. What should I watch for while using this medicine? Tell your doctor or health care professional if your symptoms do not improve. Do not use extra medicine. This medicine should start to work within a day or two of treatment. Do not get this spray in your eyes. It can cause irritation, pain, or blurred vision. If you do get any in your eyes, rinse out with plenty of cool tap water and call your health care provider. What side effects may I notice from receiving this medicine? Side effects that you should report to your doctor or health care professional as soon as possible:  allergic reactions like skin rash, itching or hives, swelling of the face, lips, or tongue or difficulty breathing  chest pain or fast heartbeat  dizziness or fainting spell  eye pain or change in vision  infection Side effects that usually do not require medical attention (report to your doctor or health care professional if they continue or are bothersome):  dry eyes, mouth or nose  irritation in the nose or throat  nausea  nosebleeds  trouble passing urine  unusual taste This list may not describe all possible side effects. Call your doctor for medical advice about side effects. You may report side effects to FDA at 1-800-FDA-1088. Where  should I keep my medicine? Keep out of the reach of children. Store at room temperature between 15 and 30 degrees C (59 and 86 degrees F). Avoid excessive humidity. Do not freeze. Throw away any unused medicine after the expiration date. NOTE: This sheet is a summary. It may not cover all possible information. If you have questions about this medicine, talk to your doctor, pharmacist,  or health care provider.  2020 Elsevier/Gold Standard (2008-02-13 11:56:06)  Allergic Rhinitis, Adult Allergic rhinitis is an allergic reaction that affects the mucous membrane inside the nose. It causes sneezing, a runny or stuffy nose, and the feeling of mucus going down the back of the throat (postnasal drip). Allergic rhinitis can be mild to severe. There are two types of allergic rhinitis:  Seasonal. This type is also called hay fever. It happens only during certain seasons.  Perennial. This type can happen at any time of the year. What are the causes? This condition happens when the body's defense system (immune system) responds to certain harmless substances called allergens as though they were germs.  Seasonal allergic rhinitis is triggered by pollen, which can come from grasses, trees, and weeds. Perennial allergic rhinitis may be caused by:  House dust mites.  Pet dander.  Mold spores. What are the signs or symptoms? Symptoms of this condition include:  Sneezing.  Runny or stuffy nose (nasal congestion).  Postnasal drip.  Itchy nose.  Tearing of the eyes.  Trouble sleeping.  Daytime sleepiness. How is this diagnosed? This condition may be diagnosed based on:  Your medical history.  A physical exam.  Tests to check for related conditions, such as: ? Asthma. ? Pink eye. ? Ear infection. ? Upper respiratory infection.  Tests to find out which allergens trigger your symptoms. These may include skin or blood tests. How is this treated? There is no cure for this condition, but treatment can help control symptoms. Treatment may include:  Taking medicines that block allergy symptoms, such as antihistamines. Medicine may be given as a shot, nasal spray, or pill.  Avoiding the allergen.  Desensitization. This treatment involves getting ongoing shots until your body becomes less sensitive to the allergen. This treatment may be done if other treatments do not  help.  If taking medicine and avoiding the allergen does not work, new, stronger medicines may be prescribed. Follow these instructions at home:  Find out what you are allergic to. Common allergens include smoke, dust, and pollen.  Avoid the things you are allergic to. These are some things you can do to help avoid allergens: ? Replace carpet with wood, tile, or vinyl flooring. Carpet can trap dander and dust. ? Do not smoke. Do not allow smoking in your home. ? Change your heating and air conditioning filter at least once a month. ? During allergy season:  Keep windows closed as much as possible.  Plan outdoor activities when pollen counts are lowest. This is usually during the evening hours.  When coming indoors, change clothing and shower before sitting on furniture or bedding.  Take over-the-counter and prescription medicines only as told by your health care provider.  Keep all follow-up visits as told by your health care provider. This is important. Contact a health care provider if:  You have a fever.  You develop a persistent cough.  You make whistling sounds when you breathe (you wheeze).  Your symptoms interfere with your normal daily activities. Get help right away if:  You have shortness of breath. Summary  This condition can be managed by taking medicines as directed and avoiding allergens.  Contact your health care provider if you develop a persistent cough or fever.  During allergy season, keep windows closed as much as possible. This information is not intended to replace advice given to you by your health care provider. Make sure you discuss any questions you have with your health care provider. Document Revised: 02/12/2017 Document Reviewed: 04/09/2016 Elsevier Patient Education  [place].   Postnasal Drip Postnasal drip is the feeling of mucus going down the back of your throat. Mucus is a slimy substance that moistens and cleans your nose  and throat, as well as the air pockets in face bones near your forehead and cheeks (sinuses). Small amounts of mucus pass from your nose and sinuses down the back of your throat all the time. This is normal. When you produce too much mucus or the mucus gets too thick, you can feel it. Some common causes of postnasal drip include:  Having more mucus because of: ? A cold or the flu. ? Allergies. ? Cold air. ? Certain medicines.  Having more mucus that is thicker because of: ? A sinus or nasal infection. ? Dry air. ? A food allergy. Follow these instructions at home: Relieving discomfort   Gargle with a salt-water mixture 3-4 times a day or as needed. To make a salt-water mixture, completely dissolve -1 tsp of salt in 1 cup of warm water.  If the air in your home is dry, use a humidifier to add moisture to the air.  Use a saline spray or container (neti pot) to flush out the nose (nasal irrigation). These methods can help clear away mucus and keep the nasal passages moist. General instructions  Take over-the-counter and prescription medicines only as told by your health care provider.  Follow instructions from your health care provider about eating or drinking restrictions. You may need to avoid caffeine.  Avoid things that you know you are allergic to (allergens), like dust, mold, pollen, pets, or certain foods.  Drink enough fluid to keep your urine pale yellow.  Keep all follow-up visits as told by your health care provider. This is important. Contact a health care provider if:  You have a fever.  You have a sore throat.  You have difficulty swallowing.  You have headache.  You have sinus pain.  You have a cough that does not go away.  The mucus from your nose becomes thick and is green or yellow in color.  You have cold or flu symptoms that last more than 10 days. Summary  Postnasal drip is the feeling of mucus going down the back of your throat.  If your  health care provider approves, use nasal irrigation or a nasal spray 2?4 times a day.  Avoid things that you know you are allergic to (allergens), like dust, mold, pollen, pets, or certain foods. This information is not intended to replace advice given to you by your health care provider. Make sure you discuss any questions you have with your health care provider. Document Revised: 06/24/2018 Document Reviewed: 06/15/2016 Elsevier Patient Education  [place].   Cough, Adult Coughing is a reflex that clears your throat and your airways (respiratory system). Coughing helps to heal and protect your lungs. It is normal to cough occasionally, but a cough that happens with other symptoms or lasts a long time may be a sign of a condition that needs treatment. An acute cough  may only last 2-3 weeks, while a chronic cough may last 8 or more weeks. Coughing is commonly caused by:  Infection of the respiratory systemby viruses or bacteria.  Breathing in substances that irritate your lungs.  Allergies.  Asthma.  Mucus that runs down the back of your throat (postnasal drip).  Smoking.  Acid backing up from the stomach into the esophagus (gastroesophageal reflux).  Certain medicines.  Chronic lung problems.  Other medical conditions such as heart failure or a blood clot in the lung (pulmonary embolism). Follow these instructions at home: Medicines  Take over-the-counter and prescription medicines only as told by your health care provider.  Talk with your health care provider before you take a cough suppressant medicine. Lifestyle   Avoid cigarette smoke. Do not use any products that contain nicotine or tobacco, such as cigarettes, e-cigarettes, and chewing tobacco. If you need help quitting, ask your health care provider.  Drink enough fluid to keep your urine pale yellow.  Avoid caffeine.  Do not drink alcohol if your health care provider tells you not to drink. General  instructions   Pay close attention to changes in your cough. Tell your health care provider about them.  Always cover your mouth when you cough.  Avoid things that make you cough, such as perfume, candles, cleaning products, or campfire or tobacco smoke.  If the air is dry, use a cool mist vaporizer or humidifier in your bedroom or your home to help loosen secretions.  If your cough is worse at night, try to sleep in a semi-upright position.  Rest as needed.  Keep all follow-up visits as told by your health care provider. This is important. Contact a health care provider if you:  Have new symptoms.  Cough up pus.  Have a cough that does not get better after 2-3 weeks or gets worse.  Cannot control your cough with cough suppressant medicines and you are losing sleep.  Have pain that gets worse or pain that is not helped with medicine.  Have a fever.  Have unexplained weight loss.  Have night sweats. Get help right away if:  You cough up blood.  You have difficulty breathing.  Your heartbeat is very fast. These symptoms may represent a serious problem that is an emergency. Do not wait to see if the symptoms will go away. Get medical help right away. Call your local emergency services (911 in the [place] Do not drive yourself to the hospital. Summary  Coughing is a reflex that clears your throat and your airways. It is normal to cough occasionally, but a cough that happens with other symptoms or lasts a long time may be a sign of a condition that needs treatment.  Take over-the-counter and prescription medicines only as told by your health care provider.  Always cover your mouth when you cough.  Contact a health care provider if you have new symptoms or a cough that does not get better after 2-3 weeks or gets worse. This information is not intended to replace advice given to you by your health care provider. Make sure you discuss any questions you have with your health  care provider. Document Revised: 03/21/2018 Document Reviewed: 03/21/2018 Elsevier Patient Education  [place].

## 2020-03-10 ENCOUNTER — Other Ambulatory Visit: Payer: Self-pay | Admitting: Internal Medicine

## 2020-04-08 ENCOUNTER — Ambulatory Visit (INDEPENDENT_AMBULATORY_CARE_PROVIDER_SITE_OTHER): Payer: Medicare Other

## 2020-04-08 VITALS — Ht 73.0 in | Wt 205.0 lb

## 2020-04-08 DIAGNOSIS — Z0000 Encounter for general adult medical examination without abnormal findings: Secondary | ICD-10-CM

## 2020-04-08 NOTE — Patient Instructions (Addendum)
[name] , Thank you for taking time to come for your Medicare Wellness Visit. I appreciate your ongoing commitment to your health goals. Please review the following plan we discussed and let me know if I can assist you in the future.   These are the goals we discussed: Goals      Patient Stated   .  I want to maintain a healthy lifestyle (pt-stated)      Stay active       This is a list of the screening recommended for you and due dates:  Health Maintenance  Topic Date Due  .  Hepatitis C: One time screening is recommended by Center for Disease Control  (CDC) for  adults born from [birth year] through 1963.   04/08/2020*  . COVID-19 Vaccine (4 - Booster for [place] series) 08/12/2020  . Tetanus Vaccine  10/16/2022  . Flu Shot  Completed  . Pneumonia vaccines  Completed  *Topic was postponed. The date shown is not the original due date.    Immunizations Immunization History  Administered Date(s) Administered  . Fluad Quad(high Dose 65+) 12/15/2019  . Influenza Split 02/13/2011  . Influenza, High Dose Seasonal PF 12/30/2015, 01/07/2018  . Influenza,inj,Quad PF,6+ Mos 11/08/2013, 11/30/2014, 03/29/2017  . Influenza-Unspecified 12/30/2012  . PFIZER(Purple Top)SARS-COV-2 Vaccination 03/20/2019, 04/10/2019, 02/13/2020  . Pneumococcal Conjugate-13 11/08/2013  . Pneumococcal Polysaccharide-23 02/13/2011, 02/07/2018  . Tdap 10/15/2012   Keep all routine maintenance appointments.   Follow up 05/06/20 @ 8:30  Advanced directives: End of life planning; Advance aging; Advanced directives discussed.  Copy of current HCPOA/Living Will requested.    Conditions/risks identified: none new.  Follow up in one year for your annual wellness visit.   Preventive Care 61 Years and Older, Male Preventive care refers to lifestyle choices and visits with your health care provider that can promote health and wellness. What does preventive care include?  A yearly physical exam. This is also called an  annual well check.  Dental exams once or twice a year.  Routine eye exams. Ask your health care provider how often you should have your eyes checked.  Personal lifestyle choices, including:  Daily care of your teeth and gums.  Regular physical activity.  Eating a healthy diet.  Avoiding tobacco and drug use.  Limiting alcohol use.  Practicing safe sex.  Taking low doses of aspirin every day.  Taking vitamin and mineral supplements as recommended by your health care provider. What happens during an annual well check? The services and screenings done by your health care provider during your annual well check will depend on your age, overall health, lifestyle risk factors, and family history of disease. Counseling  Your health care provider may ask you questions about your:  Alcohol use.  Tobacco use.  Drug use.  Emotional well-being.  Home and relationship well-being.  Sexual activity.  Eating habits.  History of falls.  Memory and ability to understand (cognition).  Work and work Statistician. Screening  You may have the following tests or measurements:  Height, weight, and BMI.  Blood pressure.  Lipid and cholesterol levels. These may be checked every 5 years, or more frequently if you are over 59 years old.  Skin check.  Lung cancer screening. You may have this screening every year starting at age 37 if you have a 30-pack-year history of smoking and currently smoke or have quit within the past 15 years.  Fecal occult blood test (FOBT) of the stool. You may have this test every  year starting at age 4.  Flexible sigmoidoscopy or colonoscopy. You may have a sigmoidoscopy every 5 years or a colonoscopy every 10 years starting at age 44.  Prostate cancer screening. Recommendations will vary depending on your family history and other risks.  Hepatitis C blood test.  Hepatitis B blood test.  Sexually transmitted disease (STD) testing.  Diabetes  screening. This is done by checking your blood sugar (glucose) after you have not eaten for a while (fasting). You may have this done every 1-3 years.  Abdominal aortic aneurysm (AAA) screening. You may need this if you are a current or former smoker.  Osteoporosis. You may be screened starting at age 51 if you are at high risk. Talk with your health care provider about your test results, treatment options, and if necessary, the need for more tests. Vaccines  Your health care provider may recommend certain vaccines, such as:  Influenza vaccine. This is recommended every year.  Tetanus, diphtheria, and acellular pertussis (Tdap, Td) vaccine. You may need a Td booster every 10 years.  Zoster vaccine. You may need this after age 48.  Pneumococcal 13-valent conjugate (PCV13) vaccine. One dose is recommended after age 54.  Pneumococcal polysaccharide (PPSV23) vaccine. One dose is recommended after age 30. Talk to your health care provider about which screenings and vaccines you need and how often you need them. This information is not intended to replace advice given to you by your health care provider. Make sure you discuss any questions you have with your health care provider. Document Released: 03/29/2015 Document Revised: 11/20/2015 Document Reviewed: 01/01/2015 Elsevier Interactive Patient Education  2017 [place] Prevention in the Home Falls can cause injuries. They can happen to people of all ages. There are many things you can do to make your home safe and to help prevent falls. What can I do on the outside of my home?  Regularly fix the edges of walkways and driveways and fix any cracks.  Remove anything that might make you trip as you walk through a door, such as a raised step or threshold.  Trim any bushes or trees on the path to your home.  Use bright outdoor lighting.  Clear any walking paths of anything that might make someone trip, such as rocks or  tools.  Regularly check to see if handrails are loose or broken. Make sure that both sides of any steps have handrails.  Any raised decks and porches should have guardrails on the edges.  Have any leaves, snow, or ice cleared regularly.  Use sand or salt on walking paths during winter.  Clean up any spills in your garage right away. This includes oil or grease spills. What can I do in the bathroom?  Use night lights.  Install grab bars by the toilet and in the tub and shower. Do not use towel bars as grab bars.  Use non-skid mats or decals in the tub or shower.  If you need to sit down in the shower, use a plastic, non-slip stool.  Keep the floor dry. Clean up any water that spills on the floor as soon as it happens.  Remove soap buildup in the tub or shower regularly.  Attach bath mats securely with double-sided non-slip rug tape.  Do not have throw rugs and other things on the floor that can make you trip. What can I do in the bedroom?  Use night lights.  Make sure that you have a light by your bed that  is easy to reach.  Do not use any sheets or blankets that are too big for your bed. They should not hang down onto the floor.  Have a firm chair that has side arms. You can use this for support while you get dressed.  Do not have throw rugs and other things on the floor that can make you trip. What can I do in the kitchen?  Clean up any spills right away.  Avoid walking on wet floors.  Keep items that you use a lot in easy-to-reach places.  If you need to reach something above you, use a strong step stool that has a grab bar.  Keep electrical cords out of the way.  Do not use floor polish or wax that makes floors slippery. If you must use wax, use non-skid floor wax.  Do not have throw rugs and other things on the floor that can make you trip. What can I do with my stairs?  Do not leave any items on the stairs.  Make sure that there are handrails on both  sides of the stairs and use them. Fix handrails that are broken or loose. Make sure that handrails are as long as the stairways.  Check any carpeting to make sure that it is firmly attached to the stairs. Fix any carpet that is loose or worn.  Avoid having throw rugs at the top or bottom of the stairs. If you do have throw rugs, attach them to the floor with carpet tape.  Make sure that you have a light switch at the top of the stairs and the bottom of the stairs. If you do not have them, ask someone to add them for you. What else can I do to help prevent falls?  Wear shoes that:  Do not have high heels.  Have rubber bottoms.  Are comfortable and fit you well.  Are closed at the toe. Do not wear sandals.  If you use a stepladder:  Make sure that it is fully opened. Do not climb a closed stepladder.  Make sure that both sides of the stepladder are locked into place.  Ask someone to hold it for you, if possible.  Clearly mark and make sure that you can see:  Any grab bars or handrails.  First and last steps.  Where the edge of each step is.  Use tools that help you move around (mobility aids) if they are needed. These include:  Canes.  Walkers.  Scooters.  Crutches.  Turn on the lights when you go into a dark area. Replace any light bulbs as soon as they burn out.  Set up your furniture so you have a clear path. Avoid moving your furniture around.  If any of your floors are uneven, fix them.  If there are any pets around you, be aware of where they are.  Review your medicines with your doctor. Some medicines can make you feel dizzy. This can increase your chance of falling. Ask your doctor what other things that you can do to help prevent falls. This information is not intended to replace advice given to you by your health care provider. Make sure you discuss any questions you have with your health care provider. Document Released: 12/27/2008 Document Revised:  08/08/2015 Document Reviewed: 04/06/2014 Elsevier Interactive Patient Education  2017 [employer].

## 2020-04-08 NOTE — Progress Notes (Addendum)
Subjective:   Bobby Murillo is a 80 y.o. male who presents for Medicare Annual/Subsequent preventive examination.  Review of Systems     No ROS.  Medicare Wellness Virtual Visit.   Cardiac Risk Factors include: advanced age (>4men, >49 women);male gender;hypertension     Objective:    Today's Vitals   04/08/20 0932  Weight: 205 lb (93 kg)  Height: 6\' 1"  (1.854 m)   Body mass index is 27.05 kg/m.  Advanced Directives 04/08/2020 04/06/2019 04/04/2018 03/21/2018 02/21/2018 03/29/2017  Does Patient Have a Medical Advance Directive? Yes Yes Yes Yes Yes Yes  Type of Paramedic of [place];Living will [place];Living will [place];Living will - - Living will;Healthcare Power of Attorney  Does patient want to make changes to medical advance directive? No - Patient declined No - Patient declined No - Patient declined - - No - Patient declined  Copy of [place] in Chart? No - copy requested No - copy requested No - copy requested - - No - copy requested    Current Medications (verified) Outpatient Encounter Medications as of 04/08/2020  Medication Sig   acetaminophen (TYLENOL) 325 MG tablet Take 650 mg by mouth every 6 (six) hours as needed.   ALPRAZolam (XANAX) 0.5 MG tablet TAKE 1 TABLET BY MOUTH AT BEDTIME AS NEEDED FOR SLEEP (Patient taking differently: Take 0.25 mg by mouth at bedtime as needed.)   Ascorbic Acid (VITAMIN C PO) Take by mouth.   aspirin 81 MG tablet Take 1 tablet by mouth daily. 1 tab by mouth daily   Cyanocobalamin (B-12 PO) Take by mouth.   ezetimibe (ZETIA) 10 MG tablet Take 1 tablet by mouth once daily   Glucosamine-Chondroitin (GLUCOSAMINE CHONDR COMPLEX PO) Take 1 tablet by mouth daily. tab by mouth daily   ipratropium (ATROVENT) 0.06 % nasal spray Place 2 sprays into both nostrils 4 (four) times daily. prn   losartan (COZAAR) 50 MG tablet Take 1 tablet by mouth once daily    polycarbophil (FIBERCON) 625 MG tablet Take 625 mg by mouth daily. Reported on 05/29/2015   TURMERIC PO Take by mouth.   No facility-administered encounter medications on file as of 04/08/2020.    Allergies (verified) Alfuzosin and Pravastatin sodium   History: Past Medical History:  Diagnosis Date   Abnormal LFTs    Anxiety    Basal cell carcinoma July 2012   left calf    Hyperlipidemia    prostate cancer 2010   Dr. Ronny Bacon   Sinus bradycardia by electrocardiogram    Past Surgical History:  Procedure Laterality Date   PROSTATECTOMY  11/2008   transurethral, radical, Dr. Darcus Austin   Family History  Problem Relation Age of Onset   Alzheimer's disease Mother    Stroke Father    Prostate cancer Father    Alzheimer's disease Sister    Stroke Paternal Aunt    Heart disease Paternal Uncle 50   Social History   Socioeconomic History   Marital status: Married    Spouse name: Not on file   Number of children: Not on file   Years of education: Not on file   Highest education level: Not on file  Occupational History   Not on file  Tobacco Use   Smoking status: Never Smoker   Smokeless tobacco: Never Used  Vaping Use   Vaping Use: Never used  Substance and Sexual Activity   Alcohol use: Yes    Alcohol/week: 8.0  standard drinks    Types: 4 Glasses of wine, 4 Shots of liquor per week    Comment: occasional   Drug use: No   Sexual activity: Yes  Other Topics Concern   Not on file  Social History Narrative   Not on file   Social Determinants of Health   Financial Resource Strain: Low Risk    Difficulty of Paying Living Expenses: Not hard at all  Food Insecurity: No Food Insecurity   Worried About Charity fundraiser in the Last Year: Never true   Ran Out of Food in the Last Year: Never true  Transportation Needs: No Transportation Needs   Lack of Transportation (Medical): No   Lack of Transportation (Non-Medical): No  Physical Activity: Not on file  Stress: No Stress  Concern Present   Feeling of Stress : Not at all  Social Connections: Unknown   Frequency of Communication with Friends and Family: More than three times a week   Frequency of Social Gatherings with Friends and Family: Once a week   Attends Religious Services: Not on Electrical engineer or Organizations: Yes   Attends Archivist Meetings: Not on file   Marital Status: Married    Tobacco Counseling Counseling given: Not Answered   Clinical Intake:  Pre-visit preparation completed: Yes        Diabetes: No  How often do you need to have someone help you when you read instructions, pamphlets, or other written materials from your doctor or pharmacy?: 1 - Never  Interpreter Needed?: No      Activities of Daily Living In your present state of health, do you have any difficulty performing the following activities: 04/08/2020  Hearing? N  Vision? N  Difficulty concentrating or making decisions? N  Walking or climbing stairs? Y  Comment Chronic arthritic pain. Paces self.  Dressing or bathing? N  Doing errands, shopping? N  Preparing Food and eating ? N  Using the Toilet? N  In the past six months, have you accidently leaked urine? N  Do you have problems with loss of bowel control? N  Managing your Medications? N  Managing your Finances? N  Housekeeping or managing your Housekeeping? N  Some recent data might be hidden    Patient Care Team: Crecencio Mc, MD as PCP - General (Internal Medicine)  Indicate any recent Medical Services you may have received from other than [facility] providers in the past year (date may be approximate).     Assessment:   This is a routine wellness examination for Bobby Murillo.  I connected with [name] today by telephone and verified that I am speaking with the correct person using two identifiers. Location patient: home Location provider: work Persons participating in the virtual visit: patient, Marine scientist.    I discussed the  limitations, risks, security and privacy concerns of performing an evaluation and management service by telephone and the availability of in person appointments. The patient expressed understanding and verbally consented to this telephonic visit.    Interactive audio and video telecommunications were attempted between this provider and patient, however failed, due to patient having technical difficulties OR patient did not have access to video capability.  We continued and completed visit with audio only.  Some vital signs may be absent or patient reported.   Hearing/Vision screen  Hearing Screening   125Hz  250Hz  500Hz  1000Hz  2000Hz  3000Hz  4000Hz  6000Hz  8000Hz   Right ear:  Left ear:           Comments: Patient is able to hear conversational tones without difficulty.  No issues reported.  Vision Screening Comments: Wears corrective lenses Visual acuity not assessed, virtual visit.  They have seen their ophthalmologist in the last 12 months.   Dietary issues and exercise activities discussed: Current Exercise Habits: Home exercise routine, Type of exercise: walking (tennis, golf), Intensity: Mild  Goals       Patient Stated     I want to maintain a healthy lifestyle (pt-stated)      Stay active       Depression Screen PHQ 2/9 Scores 04/08/2020 12/15/2019 08/18/2019 04/06/2019 04/04/2018 03/29/2017 02/01/2017  PHQ - 2 Score 0 0 0 0 0 0 0  PHQ- 9 Score - - - - - 0 1    Fall Risk Fall Risk  04/08/2020 03/06/2020 12/15/2019 08/18/2019 08/07/2019  Falls in the past year? 0 0 0 0 0  Number falls in past yr: 0 0 0 0 -  Injury with Fall? 0 0 0 0 -  Risk for fall due to : - - - - -  Risk for fall due to: Comment - - - - -  Follow up Falls evaluation completed Falls evaluation completed Falls evaluation completed Falls evaluation completed Falls evaluation completed    [place]: Handrails in use when climbing stairs? Yes Home free of loose throw rugs  in walkways, pet beds, electrical cords, etc? Yes  Adequate lighting in your home to reduce risk of falls? Yes   ASSISTIVE DEVICES UTILIZED TO PREVENT FALLS: Life alert? No  Use of a cane, walker or w/c? No  Grab bars in the bathroom? No  Shower chair or bench in shower? No  Elevated toilet seat or a handicapped toilet? No   TIMED UP AND GO: Was the test performed? No . Virtual visit.   Cognitive Function:  Patient is alert and oriented x3. Denies difficulty focusing, making decision.  Notes memory delay with names and words.  Enjoys brain challenging games and activities.     6CIT Screen 04/08/2020 04/06/2019 04/04/2018 03/29/2017  What Year? 0 points 0 points 0 points 0 points  What month? 0 points 0 points 0 points 0 points  What time? 0 points 0 points 0 points 0 points  Count back from 20 0 points 0 points 0 points 0 points  Months in reverse 0 points 0 points 0 points 0 points  Repeat phrase 0 points 0 points 0 points 0 points  Total Score 0 0 0 0    Immunizations Immunization History  Administered Date(s) Administered   Fluad Quad(high Dose 65+) 12/15/2019   Influenza Split 02/13/2011   Influenza, High Dose Seasonal PF 12/30/2015, 01/07/2018   Influenza,inj,Quad PF,6+ Mos 11/08/2013, 11/30/2014, 03/29/2017   Influenza-Unspecified 12/30/2012   PFIZER(Purple Top)SARS-COV-2 Vaccination 03/20/2019, 04/10/2019, 02/13/2020   Pneumococcal Conjugate-13 11/08/2013   Pneumococcal Polysaccharide-23 02/13/2011, 02/07/2018   Tdap 10/15/2012   Health Maintenance Health Maintenance  Topic Date Due   Hepatitis C Screening  04/08/2020 (Originally 02-22-1941)   COVID-19 Vaccine (4 - Booster for [employer] series) 08/12/2020   TETANUS/TDAP  10/16/2022   INFLUENZA VACCINE  Completed   PNA vac Low Risk Adult  Completed    Colorectal cancer screening: No longer required.    Lung Cancer Screening: (Low Dose CT Chest recommended if Age 55-80 years, 30 pack-year currently smoking OR have  quit w/in 15years.) does not qualify.  Hepatitis C Screening: does not qualify.  Vision Screening: Recommended annual ophthalmology exams for early detection of glaucoma and other disorders of the eye. Is the patient up to date with their annual eye exam?  Yes  Who is the provider or what is the name of the office in which the patient attends annual eye exams? [facility].  Dental Screening: Recommended annual dental exams for proper oral hygiene.   Community Resource Referral / Chronic Care Management: CRR required this visit?  No   CCM required this visit?  No      Plan:   Keep all routine maintenance appointments.   Follow up 05/06/20 @ 8:30  I have personally reviewed and noted the following in the patient's chart:   Medical and social history Use of alcohol, tobacco or illicit drugs  Current medications and supplements Functional ability and status Nutritional status Physical activity Advanced directives List of other physicians Hospitalizations, surgeries, and ER visits in previous 12 months Vitals Screenings to include cognitive, depression, and falls Referrals and appointments  In addition, I have reviewed and discussed with patient certain preventive protocols, quality metrics, and best practice recommendations. A written personalized care plan for preventive services as well as general preventive health recommendations were provided to patient via mychart.     OBrien-Blaney, Pina Sirianni L, LPN   9/35/7017    I have reviewed the above information and agree with above.   Deborra Medina, MD

## 2020-04-23 NOTE — Telephone Encounter (Signed)
Does not feel like the room is spinning but just has no balance.  Feels it more when he looks up or down. Denies any spinning of the room, just loses balance. No weakness in extremities, no blurred vision, just loss of balance . Have scheduled for 04/24/20 at 11:30.

## 2020-04-24 ENCOUNTER — Telehealth: Payer: Self-pay | Admitting: Internal Medicine

## 2020-04-24 ENCOUNTER — Encounter: Payer: Self-pay | Admitting: Internal Medicine

## 2020-04-24 ENCOUNTER — Other Ambulatory Visit: Payer: Self-pay

## 2020-04-24 ENCOUNTER — Ambulatory Visit (INDEPENDENT_AMBULATORY_CARE_PROVIDER_SITE_OTHER): Payer: Medicare Other | Admitting: Internal Medicine

## 2020-04-24 VITALS — BP 136/84 | HR 67 | Temp 97.9°F | Resp 18 | Ht 72.0 in | Wt 206.2 lb

## 2020-04-24 DIAGNOSIS — H81399 Other peripheral vertigo, unspecified ear: Secondary | ICD-10-CM | POA: Diagnosis not present

## 2020-04-24 DIAGNOSIS — D751 Secondary polycythemia: Secondary | ICD-10-CM

## 2020-04-24 DIAGNOSIS — R42 Dizziness and giddiness: Secondary | ICD-10-CM

## 2020-04-24 LAB — CBC WITH DIFFERENTIAL/PLATELET
Basophils Absolute: 0 10*3/uL (ref 0.0–0.1)
Basophils Relative: 0.3 % (ref 0.0–3.0)
Eosinophils Absolute: 0.2 10*3/uL (ref 0.0–0.7)
Eosinophils Relative: 2.2 % (ref 0.0–5.0)
HCT: 52.7 % — ABNORMAL HIGH (ref 39.0–52.0)
Hemoglobin: 17.9 g/dL — ABNORMAL HIGH (ref 13.0–17.0)
Lymphocytes Relative: 15.1 % (ref 12.0–46.0)
Lymphs Abs: 1.1 10*3/uL (ref 0.7–4.0)
MCHC: 34.1 g/dL (ref 30.0–36.0)
MCV: 91.1 fl (ref 78.0–100.0)
Monocytes Absolute: 0.8 10*3/uL (ref 0.1–1.0)
Monocytes Relative: 10.8 % (ref 3.0–12.0)
Neutro Abs: 5.3 10*3/uL (ref 1.4–7.7)
Neutrophils Relative %: 71.6 % (ref 43.0–77.0)
Platelets: 235 10*3/uL (ref 150.0–400.0)
RBC: 5.78 Mil/uL (ref 4.22–5.81)
RDW: 13.8 % (ref 11.5–15.5)
WBC: 7.5 10*3/uL (ref 4.0–10.5)

## 2020-04-24 LAB — BASIC METABOLIC PANEL
BUN: 18 mg/dL (ref 6–23)
CO2: 25 mEq/L (ref 19–32)
Calcium: 9.7 mg/dL (ref 8.4–10.5)
Chloride: 106 mEq/L (ref 96–112)
Creatinine, Ser: 1.22 mg/dL (ref 0.40–1.50)
GFR: 57.04 mL/min — ABNORMAL LOW (ref >60.00)
Glucose, Bld: 81 mg/dL (ref 70–99)
Potassium: 4.2 mEq/L (ref 3.5–5.1)
Sodium: 138 mEq/L (ref 135–145)

## 2020-04-24 MED ORDER — FLUTICASONE PROPIONATE 50 MCG/ACT NA SUSP: 2.0000 | Freq: Every day | NASAL | 6 refills | Status: DC

## 2020-04-24 MED ORDER — PREDNISONE 10 MG PO TABS: ORAL_TABLET | ORAL | 0 refills | Status: DC

## 2020-04-24 NOTE — Telephone Encounter (Signed)
lft vm for pt to call ofc to sch MRI.

## 2020-04-24 NOTE — Progress Notes (Signed)
Subjective:  Patient ID: Bobby Murillo, male    DOB: Dec 13, 1940  Age: 80 y.o. [name]: [id number]  CC: The primary encounter diagnosis was Vertigo. Diagnoses of Other peripheral vertigo, unspecified ear and Polycythemia were also pertinent to this visit.  HPI RONIE Murillo presents for recent  onset of vertigo occurring with sudden changes in head positioning.   This visit occurred during the SARS-CoV-2 public health emergency.  Safety protocols were in place, including screening questions prior to the visit, additional usage of staff PPE, and extensive cleaning of exam room while observing appropriate contact time as indicated for disinfecting solutions.   Patient is a 80 yr old male with history of hypertension  History of prostate cancer, hyperlipidemia with statin intolerance and polycythemia who presents with 3 day history of vertigo occurring with changes in positioning of torso head. First noticed when he sat up straight after leaning over legs to apply lotion after shower.  Also brought on by tilting head back.  Not accompanied by headache, sinusitis or otitis symptoms , or nausea,  But feels off balance with walking and has developed fear of falling.  Usually plays tennis and golf several times per week,  Does not feel steady enough to do this.    Father had a history of stroke which presented with balance problems   Was treated in December for URI, COVID NEGATIVE,  Presented  with rhinitis ;  Was prescribed  atrovent; did not tolerate medication due to excessive drying of mucous membranes.    some periodic  vision changes  Right eye vision gets "fuzzy" during reading both with and without eye glasses.   Neurologic exam normal including gait.    Outpatient Medications Prior to Visit  Medication Sig Dispense Refill  . acetaminophen (TYLENOL) 325 MG tablet Take 650 mg by mouth every 6 (six) hours as needed.    . ALPRAZolam (XANAX) 0.5 MG tablet TAKE 1 TABLET BY MOUTH AT BEDTIME AS  NEEDED FOR SLEEP (Patient taking differently: Take 0.25 mg by mouth at bedtime as needed.) 30 tablet 5  . Ascorbic Acid (VITAMIN C PO) Take by mouth.    Marland Kitchen aspirin 81 MG tablet Take 1 tablet by mouth daily. 1 tab by mouth daily    . Cyanocobalamin (B-12 PO) Take by mouth.    . ezetimibe (ZETIA) 10 MG tablet Take 1 tablet by mouth once daily 90 tablet 3  . Glucosamine-Chondroitin (GLUCOSAMINE CHONDR COMPLEX PO) Take 1 tablet by mouth daily. tab by mouth daily    . losartan (COZAAR) 50 MG tablet Take 1 tablet by mouth once daily 90 tablet 0  . polycarbophil (FIBERCON) 625 MG tablet Take 625 mg by mouth daily. Reported on 05/29/2015    . TURMERIC PO Take by mouth.    Marland Kitchen ipratropium (ATROVENT) 0.06 % nasal spray Place 2 sprays into both nostrils 4 (four) times daily. prn (Patient not taking: Reported on 04/24/2020) 15 mL 12   No facility-administered medications prior to visit.    Review of Systems;  Patient denies headache, fevers, malaise, unintentional weight loss, skin rash, eye pain, sinus congestion and sinus pain, sore throat, dysphagia,  hemoptysis , cough, dyspnea, wheezing, chest pain, palpitations, orthopnea, edema, abdominal pain, nausea, melena, diarrhea, constipation, flank pain, dysuria, hematuria, urinary  Frequency, nocturia, numbness, tingling, seizures,  Focal weakness, Loss of consciousness,  Tremor, insomnia, depression, anxiety, and suicidal ideation.      Objective:  BP 136/84 (BP Location: Left Arm, Patient Position: Sitting, Cuff  Size: Normal)   Pulse 67   Temp 97.9 F (36.6 C) (Oral)   Resp 18   Ht 6' (1.829 m)   Wt 206 lb 4 oz (93.6 kg)   SpO2 98%   BMI 27.97 kg/m   BP Readings from Last 3 Encounters:  04/24/20 136/84  01/02/20 129/67  12/15/19 (!) 150/80    Wt Readings from Last 3 Encounters:  04/24/20 206 lb 4 oz (93.6 kg)  04/08/20 205 lb (93 kg)  03/06/20 205 lb (93 kg)    General appearance: alert, cooperative and appears stated age Ears: normal  TM's and external ear canals both ears Throat: lips, mucosa, and tongue normal; teeth and gums normal Neck: no adenopathy, no carotid bruit, supple, symmetrical, trachea midline and thyroid not enlarged, symmetric, no tenderness/mass/nodules Back: symmetric, no curvature. ROM normal. No CVA tenderness. Lungs: clear to auscultation bilaterally Heart: regular rate and rhythm, S1, S2 normal, no murmur, click, rub or gallop Abdomen: soft, non-tender; bowel sounds normal; no masses,  no organomegaly Pulses: 2+ and symmetric Skin: Skin color, texture, turgor normal. No rashes or lesions Lymph nodes: Cervical, supraclavicular, and axillary nodes normal. Neuro:  awake and interactive with normal mood and affect. Higher cortical functions are normal. Speech is clear without word-finding difficulty or dysarthria. Extraocular movements are intact. Visual fields of both eyes are grossly intact. Sensation to light touch is grossly intact bilaterally of upper and lower extremities. Motor examination shows 4+/5 symmetric hand grip and upper extremity and 5/5 lower extremity strength. There is no pronation or drift. Gait is non-ataxic and normal .. Cerebellar function is normal    Lab Results  Component Value Date   HGBA1C 5.8 06/09/2019   HGBA1C 5.9 05/08/2019    Lab Results  Component Value Date   CREATININE 1.22 04/24/2020   CREATININE 1.26 (H) 12/22/2019   CREATININE 1.18 12/15/2019    Lab Results  Component Value Date   WBC 7.5 04/24/2020   HGB 17.9 Repeated and verified X2. (H) 04/24/2020   HCT 52.7 Repeated and verified X2. (H) 04/24/2020   PLT 235.0 04/24/2020   GLUCOSE 81 04/24/2020   CHOL 229 (H) 05/08/2019   TRIG 126.0 05/08/2019   HDL 67.60 05/08/2019   LDLDIRECT 184.0 12/30/2015   LDLCALC 137 (H) 05/08/2019   ALT 27 05/08/2019   AST 23 05/08/2019   NA 138 04/24/2020   K 4.2 04/24/2020   CL 106 04/24/2020   CREATININE 1.22 04/24/2020   BUN 18 04/24/2020   CO2 25 04/24/2020    TSH 4.03 05/08/2019   PSA 0.00 (L) 05/08/2019   HGBA1C 5.8 06/09/2019   MICROALBUR <0.7 12/15/2019    CT RENAL STONE STUDY  Result Date: 09/05/2018 CLINICAL DATA:  Left lower quadrant pain since last week. Microscopic hematuria. History of prostate cancer. EXAM: CT ABDOMEN AND PELVIS WITHOUT CONTRAST TECHNIQUE: Multidetector CT imaging of the abdomen and pelvis was performed following the standard protocol without IV contrast. COMPARISON:  Report only from abdominopelvic CT 10/10/2003 FINDINGS: Lower chest: There is a small calcified right lower lobe granuloma. The lung bases are otherwise clear. There is no pleural effusion. Coronary artery calcifications are noted. Hepatobiliary: The liver appears unremarkable as imaged in the noncontrast state. No evidence of gallstones, gallbladder wall thickening or biliary dilatation. Pancreas: Unremarkable. No pancreatic ductal dilatation or surrounding inflammatory changes. Spleen: Normal in size without focal abnormality. Adrenals/Urinary Tract: Both adrenal glands appear normal. There is mild dilatation of the left renal pelvis and left ureter. There  is mild perinephric soft tissue stranding. No urinary tract calculi are demonstrated. The right collecting system is not dilated. The bladder appears normal. Stomach/Bowel: No evidence of bowel wall thickening, distention or surrounding inflammatory change. The appendix appears normal. Vascular/Lymphatic: There are no enlarged abdominal or pelvic lymph nodes. Mild aortic and branch vessel atherosclerosis. Reproductive: Postsurgical changes in the pelvis consistent with prostatectomy. No evidence of pelvic mass. Other: Postsurgical changes in the low anterior abdominal wall. Small umbilical hernia containing only fat. No ascites or peritoneal nodularity. Musculoskeletal: No acute or significant osseous findings. There are bilateral L5 pars defects with a minimal anterolisthesis and mild left foraminal narrowing at  L5-S1. Moderately advanced degenerative changes are present at L3-4. IMPRESSION: 1. Nonspecific mild left-sided hydronephrosis and hydroureter without evidence of urinary tract calculus. This could be secondary to a recently passed calculus. Clinical follow-up recommended. 2. No evidence of metastatic prostate cancer post prostatectomy. 3. Coronary and Aortic Atherosclerosis (ICD10-I70.0). 4. Bilateral L5 pars defects. Electronically Signed   By: Richardean Sale M.D.   On: 09/05/2018 11:51    Assessment & Plan:   Problem List Items Addressed This Visit      Unprioritized   Polycythemia    Recurrent, may be the cause of his dizziness.  2020 hematology workup reviewed,  Further workup deferred unless symptomatic.  Recommending sleep study and hematology follow up,  Full strength asa recommended       Vertigo - Primary    Etiology unclear,  He as a drop in systolic BP of 14 pts with no increase in pulse,  But standing BP still 130.  Will increase hydration add flonase/prednisone taper for eustachian tube dysfunction,  And order MRI w/w/o contrast given history of prostate CA to rule out masses and stroked       Relevant Medications   predniSONE (DELTASONE) 10 MG tablet   fluticasone (FLONASE) 50 MCG/ACT nasal spray   Other Relevant Orders   Basic metabolic panel (Completed)   CBC with Differential/Platelet (Completed)    Other Visit Diagnoses    Other peripheral vertigo, unspecified ear       Relevant Orders   MR Brain W Wo Contrast    A total of 40 minutes was spent with patient more than half of which was spent in counseling patient on the above mentioned issues , reviewing and explaining recent labs and imaging studies done, and coordination of care.   I have discontinued Haitham Dolinsky. [place]'s ipratropium. I am also having him start on predniSONE, fluticasone, and Ascriptin. Additionally, I am having him maintain his acetaminophen, polycarbophil, Glucosamine-Chondroitin (GLUCOSAMINE CHONDR  COMPLEX PO), aspirin, ezetimibe, ALPRAZolam, TURMERIC PO, Cyanocobalamin (B-12 PO), Ascorbic Acid (VITAMIN C PO), and losartan.  Meds ordered this encounter  Medications  . predniSONE (DELTASONE) 10 MG tablet    Sig: 6 tablets on Day 1 , then reduce by 1 tablet daily until gone    Dispense:  21 tablet    Refill:  0  . fluticasone (FLONASE) 50 MCG/ACT nasal spray    Sig: Place 2 sprays into both nostrils daily.    Dispense:  16 g    Refill:  6  . Aspirin Buf,AlHyd-MgHyd-CaCar, (ASCRIPTIN) 325 MG TABS    Sig: Take 1 tablet by mouth daily.    Dispense:  360 tablet    Refill:  0    Medications Discontinued During This Encounter  Medication Reason  . ipratropium (ATROVENT) 0.06 % nasal spray Side effect (s)    Follow-up: No follow-ups  on file.   Crecencio Mc, MD

## 2020-04-24 NOTE — Patient Instructions (Addendum)
Your dizziness may be occurring for several reasons:  1) mild dehydration 2) inner ear problem 3) stroke (less likely,  But needs to be ruled out if the #1 And #2 causes are addressed and it doesn't resolve  Our plan:  1) Try to gradually increase your non caffeinated non alcohol beverage intake to 60 ounces daily  2)Trial of flonase and prednisone taper to treat any Eustachian tube dysfunction that may be causing an inner ear issue   3) MRI of brain to look at the cerebellum (which is where strokes occur that affect balance0

## 2020-04-24 NOTE — Assessment & Plan Note (Signed)
Etiology unclear,  He as a drop in systolic BP of 14 pts with no increase in pulse,  But standing BP still 130.  Will increase hydration add flonase/prednisone taper for eustachian tube dysfunction,  And order MRI w/w/o contrast given history of prostate CA to rule out masses and stroked

## 2020-04-25 MED ORDER — ASCRIPTIN 325 MG PO TABS: 1.0000 | ORAL_TABLET | Freq: Every day | ORAL | 0 refills | Status: DC

## 2020-04-25 NOTE — Addendum Note (Signed)
Addended by: Crecencio Mc on: 04/25/2020 08:05 PM   Modules accepted: Orders

## 2020-04-25 NOTE — Assessment & Plan Note (Signed)
Recurrent, may be the cause of his dizziness.  2020 hematology workup reviewed,  Further workup deferred unless symptomatic.  Recommending sleep study and hematology follow up,  Full strength asa recommended

## 2020-04-26 ENCOUNTER — Ambulatory Visit: Payer: Medicare Other | Admitting: Internal Medicine

## 2020-05-06 ENCOUNTER — Ambulatory Visit
Admission: RE | Admit: 2020-05-06 | Discharge: 2020-05-06 | Disposition: A | Discharge status: Home / Self Care | Payer: Medicare Other | Source: Ambulatory Visit | Attending: Internal Medicine | Admitting: Internal Medicine

## 2020-05-06 ENCOUNTER — Other Ambulatory Visit: Payer: Self-pay

## 2020-05-06 ENCOUNTER — Ambulatory Visit (INDEPENDENT_AMBULATORY_CARE_PROVIDER_SITE_OTHER): Payer: Medicare Other | Admitting: Internal Medicine

## 2020-05-06 ENCOUNTER — Encounter: Payer: Self-pay | Admitting: Internal Medicine

## 2020-05-06 VITALS — BP 140/92 | HR 47 | Temp 97.1°F | Resp 15 | Ht 72.0 in | Wt 205.6 lb

## 2020-05-06 DIAGNOSIS — I700 Atherosclerosis of aorta: Secondary | ICD-10-CM

## 2020-05-06 DIAGNOSIS — T466X5A Adverse effect of antihyperlipidemic and antiarteriosclerotic drugs, initial encounter: Secondary | ICD-10-CM | POA: Diagnosis not present

## 2020-05-06 DIAGNOSIS — Z789 Other specified health status: Secondary | ICD-10-CM | POA: Diagnosis not present

## 2020-05-06 DIAGNOSIS — R42 Dizziness and giddiness: Secondary | ICD-10-CM | POA: Diagnosis not present

## 2020-05-06 DIAGNOSIS — E785 Hyperlipidemia, unspecified: Secondary | ICD-10-CM | POA: Diagnosis not present

## 2020-05-06 DIAGNOSIS — I6782 Cerebral ischemia: Secondary | ICD-10-CM | POA: Diagnosis not present

## 2020-05-06 DIAGNOSIS — R944 Abnormal results of kidney function studies: Secondary | ICD-10-CM

## 2020-05-06 DIAGNOSIS — M791 Myalgia, unspecified site: Secondary | ICD-10-CM | POA: Diagnosis not present

## 2020-05-06 DIAGNOSIS — H81399 Other peripheral vertigo, unspecified ear: Secondary | ICD-10-CM | POA: Diagnosis not present

## 2020-05-06 DIAGNOSIS — Z1159 Encounter for screening for other viral diseases: Secondary | ICD-10-CM

## 2020-05-06 DIAGNOSIS — N1831 Chronic kidney disease, stage 3a: Secondary | ICD-10-CM | POA: Diagnosis not present

## 2020-05-06 DIAGNOSIS — Z0000 Encounter for general adult medical examination without abnormal findings: Secondary | ICD-10-CM

## 2020-05-06 DIAGNOSIS — Z125 Encounter for screening for malignant neoplasm of prostate: Secondary | ICD-10-CM

## 2020-05-06 DIAGNOSIS — G319 Degenerative disease of nervous system, unspecified: Secondary | ICD-10-CM | POA: Diagnosis not present

## 2020-05-06 DIAGNOSIS — E559 Vitamin D deficiency, unspecified: Secondary | ICD-10-CM

## 2020-05-06 DIAGNOSIS — I10 Essential (primary) hypertension: Secondary | ICD-10-CM

## 2020-05-06 LAB — COMPREHENSIVE METABOLIC PANEL
ALT: 29 U/L (ref 0–53)
AST: 23 U/L (ref 0–37)
Albumin: 3.9 g/dL (ref 3.5–5.2)
Alkaline Phosphatase: 91 U/L (ref 39–117)
BUN: 16 mg/dL (ref 6–23)
CO2: 26 mEq/L (ref 19–32)
Calcium: 9.7 mg/dL (ref 8.4–10.5)
Chloride: 106 mEq/L (ref 96–112)
Creatinine, Ser: 1.38 mg/dL (ref 0.40–1.50)
GFR: 49.19 mL/min — ABNORMAL LOW (ref >60.00)
Glucose, Bld: 101 mg/dL — ABNORMAL HIGH (ref 70–99)
Potassium: 5 mEq/L (ref 3.5–5.1)
Sodium: 140 mEq/L (ref 135–145)
Total Bilirubin: 1.1 mg/dL (ref 0.2–1.2)
Total Protein: 6.3 g/dL (ref 6.0–8.3)

## 2020-05-06 LAB — LIPID PANEL
Cholesterol: 213 mg/dL — ABNORMAL HIGH (ref 0–200)
HDL: 65 mg/dL (ref >39.00)
LDL Cholesterol: 123 mg/dL — ABNORMAL HIGH (ref 0–99)
NonHDL: 148.09
Total CHOL/HDL Ratio: 3
Triglycerides: 124 mg/dL (ref 0.0–149.0)
VLDL: 24.8 mg/dL (ref 0.0–40.0)

## 2020-05-06 LAB — VITAMIN D 25 HYDROXY (VIT D DEFICIENCY, FRACTURES): VITD: 25.29 ng/mL — ABNORMAL LOW (ref 30.00–100.00)

## 2020-05-06 LAB — PSA, MEDICARE: PSA: 0 ng/ml — ABNORMAL LOW (ref 0.10–4.00)

## 2020-05-06 MED ORDER — GADOBUTROL 1 MMOL/ML IV SOLN
9.0000 mL | Freq: Once | INTRAVENOUS | Status: AC | PRN
  Administered 2020-05-06: 9 mL via INTRAVENOUS

## 2020-05-06 NOTE — Assessment & Plan Note (Addendum)
Reviewed findings of prior CT scan today..  Patient is tolerating zetia due to statin intolerance but is willing to start statin if needed for goal LDL < 100

## 2020-05-06 NOTE — Patient Instructions (Addendum)
Eustachian tube dysfunction appears to be the cause of your light headedness since it improved on prednisone , so:  Continue flonase  Try adding afrin once daily I the AM  For the dizziness.  If no change  Stop the afrin except when feeling congestion    Continue FS aspirin for now (325 mg ) given presence of atherosclerosis on previous CT scans   Atherosclerosis  Atherosclerosis is narrowing and hardening of the arteries. Arteries are blood vessels that carry blood from the heart to all parts of the body. This blood contains oxygen. Arteries can become narrow or blocked from inflammation or from a buildup of fat, cholesterol, calcium, and other substances (plaque). Plaque decreases the amount of blood that can flow through the artery. Atherosclerosis can affect any artery in your body, including:  Heart arteries. Damage to these arteries may lead to coronary artery disease, which can cause a heart attack.  Brain arteries. Damage to these arteries may cause a stroke.  Leg, arm, and pelvis arteries. Peripheral artery disease (PAD) may result from damage to these arteries.  Kidney arteries. Kidney (renal) failure may result from damage to kidney arteries. Treatment may slow the disease and prevent further damage to your heart, brain, peripheral arteries, and kidneys. What are the causes? This condition develops slowly over many years. The inner layers of your arteries become damaged and allow the gradual buildup of plaque. The exact cause of atherosclerosis is not fully understood. Symptoms of atherosclerosis do not occur until an artery becomes narrow or blocked. What increases the risk? The following factors may make you more likely to develop this condition:  Being middle-aged or older.  Certain medical conditions, including: ? High blood pressure. ? High cholesterol. ? High blood fats (triglycerides). ? Diabetes. ? Sleep apnea.  A family history of atherosclerosis.  Being  overweight.  Using products that contain tobacco or nicotine.  Not exercising enough (sedentary lifestyle).  Having a substance in your blood called C-reactive protein (CRP). This is a sign of increased levels of inflammation in your body.  Being stressed.  Drinking too much alcohol or using drugs, such as cocaine or methamphetamine. What are the signs or symptoms? Symptoms of atherosclerosis may not be obvious until there is damage to an area of your body that is not getting enough blood. Sometimes, atherosclerosis does not cause symptoms. Symptoms of this condition include:  Coronary artery disease. This may cause chest pain and shortness of breath.  Decreased blood supply to your brain, which may cause a stroke. Signs of a stroke may include sudden: ? Weakness or numbness in your face, arm, or leg, especially on one side of your body. ? Trouble walking or difficulty moving your arms or legs. ? Loss of balance or coordination. ? Confusion. ? Slurred speech (dysarthria). ? Trouble speaking, or trouble understanding speech, or both (aphasia). ? Vision changes in one or both eyes. This may be double vision, blurred vision, or loss of vision. ? Severe headache with no known cause. The headache is often described as the worst headache ever experienced.  PAD, which may cause pain and numbness, often in your legs and hips.  Renal failure. This may cause tiredness, problems with urination, swelling, and itchy skin. How is this diagnosed? This condition is diagnosed based on your medical history and a physical exam. During the exam, your health care provider will:  Check your pulse in different places.  Listen for a "whooshing" sound over your arteries (bruit). You may  also have tests, such as:  Blood tests to check your levels of cholesterol, triglycerides, and CRP.  Electrocardiogram (ECG) to check for heart damage.  Chest X-ray to see if you have an enlarged heart, which is a  sign of heart failure.  Stress test to see how your heart reacts to exercise.  Echocardiogram to get images of the inside of your heart.  Ankle-brachial index to compare blood pressure in your arms to blood pressure in your ankles.  Ultrasound of your peripheral arteries to check blood flow.  CT scan to check for damage to your heart or brain.  X-rays of blood vessels after dye has been injected (angiogram) to check blood flow. How is this treated? This condition is treated with lifestyle changes as the first step. These may include:  Changing your diet.  Losing weight.  Reducing stress.  Exercising and being physically active more regularly.  Quitting smoking. You may also need medicine to:  Lower triglycerides and cholesterol.  Control blood pressure.  Prevent blood clots.  Lower inflammation in your body.  Control your blood sugar. Sometimes, surgery is needed to:  Remove plaque from an artery (endarterectomy).  Open or widen a narrowed heart artery (angioplasty).  Create a new path for your blood with one of these procedures: ? Heart (coronary) artery bypass graft surgery. ? Peripheral artery bypass graft surgery. Follow these instructions at home: Eating and drinking  Eat a heart-healthy diet. Talk with your health care provider or a diet and nutrition specialist (dietitian) if you need help. A heart-healthy diet involves: ? Limiting unhealthy fats and increasing healthy fats. Some examples of healthy fats are olive oil and canola oil. ? Eating plant-based foods, such as fruits, vegetables, nuts, whole grains, and legumes (such as peas and lentils).  If you drink alcohol: ? Limit how much you use to:  0-1 drink a day for nonpregnant women.  0-2 drinks a day for men. ? Be aware of how much alcohol is in your drink. In the [place] one drink equals one 12 oz bottle of beer (355 mL), one 5 oz glass of wine (148 mL), or one 1 oz glass of hard liquor (44 mL).    Lifestyle  Maintain a healthy weight. Lose weight if your health care provider says that you need to do that.  Follow an exercise program as told by your health care provider.  Do not use any products that contain nicotine or tobacco, such as cigarettes, e-cigarettes, and chewing tobacco. If you need help quitting, ask your health care provider.  Do not use drugs.   General instructions  Take over-the-counter and prescription medicines only as told by your health care provider.  Manage other health conditions as told.  Keep all follow-up visits as told by your health care provider. This is important. Contact a health care provider if you have:  An irregular heartbeat.  Unexplained fatigue.  Trouble urinating, or you are producing less urine or foamy urine.  Swelling of your hands or feet, or itchy skin.  Unexplained pain or numbness in your legs or hips. Get help right away if:  You have any symptoms of a heart attack. These may be: ? Chest pain. This includes squeezing chest pain that may feel like indigestion (angina). ? Shortness of breath. ? Pain in your neck, jaw, arms, back, or stomach. ? Cold sweat. ? Nausea. ? Light-headedness.  You have any symptoms of a stroke. "BE FAST" is an easy way to remember the  main warning signs of a stroke: ? B - Balance. Signs are dizziness, sudden trouble walking, or loss of balance. ? E - Eyes. Signs are trouble seeing or a sudden change in vision. ? F - Face. Signs are sudden weakness or numbness of the face, or the face or eyelid drooping on one side. ? A - Arms. Signs are weakness or numbness in an arm. This happens suddenly and usually on one side of the body. ? S - Speech. Signs are sudden trouble speaking, slurred speech, or trouble understanding what people say. ? T - Time. Time to call emergency services. Write down what time symptoms started.  You have other signs of a stroke, such as: ? A sudden, severe headache with no  known cause. ? Nausea or vomiting. ? Seizure. These symptoms may represent a serious problem that is an emergency. Do not wait to see if the symptoms will go away. Get medical help right away. Call your local emergency services (911 in the [place] Do not drive yourself to the hospital. Summary  Atherosclerosis is narrowing and hardening of the arteries.  Arteries can become narrow from inflammation or from a buildup of fat, cholesterol, calcium, and other substances (plaque).  This condition may not cause any symptoms. If you do have symptoms, they are caused by damage to an area of your body that is not getting enough blood.  Treatment starts with lifestyle changes and may include medicines. In some cases, surgery is needed.  Get help right away if you have any symptoms of a heart attack or stroke. This information is not intended to replace advice given to you by your health care provider. Make sure you discuss any questions you have with your health care provider. Document Revised: 01/09/2019 Document Reviewed: 01/09/2019 Elsevier Patient Education  [place].

## 2020-05-06 NOTE — Assessment & Plan Note (Addendum)
He has coronary and Aortic atherosclerosis :  Discussed need for statin therapy  If LDL > 100 depiste use of zetia. He is willing to do so startign with atorvastatin 2/week.  rx sent

## 2020-05-06 NOTE — Progress Notes (Signed)
Patient ID: Bobby Murillo, male    DOB: 05/11/1940  Age: 80 y.o. [name]: [id number]  The patient is here for annual  wellness examination and management of other chronic and acute problems.   The risk factors are reflected in the social history.  The roster of all physicians providing medical care to patient - is listed in the Snapshot section of the chart.  Activities of daily living:  The patient is 100% independent in all ADLs: dressing, toileting, feeding as well as independent mobility  Home safety : The patient has smoke detectors in the home. They wear seatbelts.  There are no firearms at home. There is no violence in the home.   There is no risks for hepatitis, STDs or HIV. There is no   history of blood transfusion. They have no travel history to infectious disease endemic areas of the world.  The patient has seen their dentist in the last six month. They have seen their eye doctor in the last year. They admit to slight hearing difficulty with regard to whispered voices and some television programs.  They have deferred audiologic testing in the last year.  They do not  have excessive sun exposure. Discussed the need for sun protection: hats, long sleeves and use of sunscreen if there is significant sun exposure.   Diet: the importance of a healthy diet is discussed. They do have a healthy diet.  The benefits of regular aerobic exercise were discussed. She walks 4 times per week ,  20 minutes.   Depression screen: there are no signs or vegative symptoms of depression- irritability, change in appetite, anhedonia, sadness/tearfullness.  Cognitive assessment: the patient manages all their financial and personal affairs and is actively engaged. They could relate day,date,year and events; recalled 2/3 objects at 3 minutes; performed clock-face test normally.  The following portions of the patient's history were reviewed and updated as appropriate: allergies, current medications, past family  history, past medical history,  past surgical history, past social history  and problem list.  Visual acuity was not assessed per patient preference since she has regular follow up with her ophthalmologist. Hearing and body mass index were assessed and reviewed.   During the course of the visit the patient was educated and counseled about appropriate screening and preventive services including : fall prevention , diabetes screening, nutrition counseling, colorectal cancer screening, and recommended immunizations.    CC: There were no encounter diagnoses.  1) follow up on dizziness:  MRI brain is scheduled for  Tonight.symptoms improved with prednisone and flonase,  But Still not playing tennis regularly due to fear of falling .  At last visit he was  mildly orthostatic and was advised to increase water,  Which has not done. He continues to have intermittent episodes of right eye visual blurring and peripheral perceptions of wavy lines. Occurs with reading  Has appt with eye doctor at [facility] center.    2) aortic and coronary  atherosclerosis : noted on 2020 Renal CT .discussed with patient .   Has increased his aspirin to FS since last visit .  Taking zetia.  Did not tolerate pravastatin, but can't remember whey and he took it for "years."  willing to start a trial of high potency statin for LDL > 100 on today's labs    History Bobby Murillo has a past medical history of Abnormal LFTs, Anxiety, Basal cell carcinoma (July 2012), Hyperlipidemia, prostate cancer (2010), and Sinus bradycardia by electrocardiogram.   He has a  past surgical history that includes Prostatectomy (11/2008).   His family history includes Alzheimer's disease in his mother and sister; Heart disease (age of onset: 22) in his paternal uncle; Prostate cancer in his father; Stroke in his father and paternal aunt.He reports that he has never smoked. He has never used smokeless tobacco. He reports current alcohol use of about 8.0  standard drinks of alcohol per week. He reports that he does not use drugs.  Outpatient Medications Prior to Visit  Medication Sig Dispense Refill  . acetaminophen (TYLENOL) 325 MG tablet Take 650 mg by mouth every 6 (six) hours as needed.    . ALPRAZolam (XANAX) 0.5 MG tablet TAKE 1 TABLET BY MOUTH AT BEDTIME AS NEEDED FOR SLEEP (Patient taking differently: Take 0.25 mg by mouth at bedtime as needed.) 30 tablet 5  . Ascorbic Acid (VITAMIN C PO) Take by mouth.    Marland Kitchen aspirin 81 MG tablet Take 1 tablet by mouth daily. 1 tab by mouth daily    . Aspirin Buf,AlHyd-MgHyd-CaCar, (ASCRIPTIN) 325 MG TABS Take 1 tablet by mouth daily. 360 tablet 0  . Cyanocobalamin (B-12 PO) Take by mouth.    . ezetimibe (ZETIA) 10 MG tablet Take 1 tablet by mouth once daily 90 tablet 3  . fluticasone (FLONASE) 50 MCG/ACT nasal spray Place 2 sprays into both nostrils daily. 16 g 6  . Glucosamine-Chondroitin (GLUCOSAMINE CHONDR COMPLEX PO) Take 1 tablet by mouth daily. tab by mouth daily    . losartan (COZAAR) 50 MG tablet Take 1 tablet by mouth once daily 90 tablet 0  . polycarbophil (FIBERCON) 625 MG tablet Take 625 mg by mouth daily. Reported on 05/29/2015    . TURMERIC PO Take by mouth.    . predniSONE (DELTASONE) 10 MG tablet 6 tablets on Day 1 , then reduce by 1 tablet daily until gone (Patient not taking: Reported on 05/06/2020) 21 tablet 0   No facility-administered medications prior to visit.    Review of Systems   Patient denies headache, fevers, malaise, unintentional weight loss, skin rash, eye pain, sinus congestion and sinus pain, sore throat, dysphagia,  hemoptysis , cough, dyspnea, wheezing, chest pain, palpitations, orthopnea, edema, abdominal pain, nausea, melena, diarrhea, constipation, flank pain, dysuria, hematuria, urinary  Frequency, nocturia, numbness, tingling, seizures,  Focal weakness, Loss of consciousness,  Tremor, insomnia, depression, anxiety, and suicidal ideation.     Objective:  BP (!)  140/92 (BP Location: Left Arm, Patient Position: Sitting, Cuff Size: Normal)   Pulse (!) 47   Temp (!) 97.1 F (36.2 C) (Oral)   Resp 15   Ht 6' (1.829 m)   Wt 205 lb 9.6 oz (93.3 kg)   SpO2 99%   BMI 27.88 kg/m   Physical Exam  General appearance: alert, cooperative and appears stated age Ears: normal TM's and external ear canals both ears Throat: lips, mucosa, and tongue normal; teeth and gums normal Neck: no adenopathy, no carotid bruit, supple, symmetrical, trachea midline and thyroid not enlarged, symmetric, no tenderness/mass/nodules Back: symmetric, no curvature. ROM normal. No CVA tenderness. Lungs: clear to auscultation bilaterally Heart: regular rate and rhythm, S1, S2 normal, no murmur, click, rub or gallop Abdomen: soft, non-tender; bowel sounds normal; no masses,  no organomegaly Pulses: 2+ and symmetric Skin: Skin color, texture, turgor normal. No rashes or lesions Lymph nodes: Cervical, supraclavicular, and axillary nodes normal. Neuro: CNs 2-12 intact. DTRs 2+/4 in biceps, brachioradialis, patellars and achilles. Muscle strength 5/5 in upper and lower exremities. Fine resting tremor  bilaterally both hands cerebellar function normal. Romberg negative.  No pronator drift.   Gait normal.    Assessment & Plan:   Problem List Items Addressed This Visit   None     I have discontinued Jacorie Ernsberger. Trinidad's predniSONE. I am also having him maintain his acetaminophen, polycarbophil, Glucosamine-Chondroitin (GLUCOSAMINE CHONDR COMPLEX PO), aspirin, ezetimibe, ALPRAZolam, TURMERIC PO, Cyanocobalamin (B-12 PO), Ascorbic Acid (VITAMIN C PO), losartan, fluticasone, and Ascriptin.  No orders of the defined types were placed in this encounter.   Medications Discontinued During This Encounter  Medication Reason  . predniSONE (DELTASONE) 10 MG tablet Completed Course    Follow-up: No follow-ups on file.   Crecencio Mc, MD

## 2020-05-07 LAB — HEPATITIS C ANTIBODY
Hepatitis C Ab: NONREACTIVE
SIGNAL TO CUT-OFF: 0 (ref <1.00)

## 2020-05-07 MED ORDER — ATORVASTATIN CALCIUM 20 MG PO TABS: 20.0000 mg | ORAL_TABLET | ORAL | 3 refills | Status: DC

## 2020-05-07 NOTE — Assessment & Plan Note (Signed)
He is willing to try  Adding a high potency statin given the small vessel disease noted on MRI,  The presence of atherosclerosis noted on prior CTS , and LDL > 100 on zetia alone   Lab Results  Component Value Date   CHOL 213 (H) 05/06/2020   HDL 65.00 05/06/2020   LDLCALC 123 (H) 05/06/2020   LDLDIRECT 184.0 12/30/2015   TRIG 124.0 05/06/2020   CHOLHDL 3 05/06/2020

## 2020-05-07 NOTE — Assessment & Plan Note (Signed)
GFR decline noted today on fasting labs.  Return 3 weeks for recheck

## 2020-05-07 NOTE — Assessment & Plan Note (Signed)

## 2020-05-07 NOTE — Assessment & Plan Note (Signed)
Intermittent symptoms creating fear of falling and decreased activity.  Improved transiently with steroids (oral and inhaled).  MRI done today because of history of prostate CA and hyperlipidemia notes small vessel chronic disease,  Mucous retention polyps and ethmoid sinus thickening.  Will continue Flonase  Add afrin prn,  And add high potency statin 2/week .  Reduce aspirin to 81 mg daily

## 2020-05-07 NOTE — Assessment & Plan Note (Signed)
Not at goal on current regimen in office, ,but given his recurrent light headedness and orthostasis will await home readings . Before making any changes

## 2020-05-22 ENCOUNTER — Telehealth: Payer: Self-pay | Admitting: Internal Medicine

## 2020-05-22 NOTE — Telephone Encounter (Signed)
Please call then back and ask them to clarify 'IRREGULAR"  Bobby Murillo.  If they think he has atrial fibrillaiton  This would be new and he will need to be seen ASAP and have an EKG done

## 2020-05-22 NOTE — Telephone Encounter (Signed)
Doctors with [facility] called to report that when they came out pt BP was 136/70 pulse 61 irregular rhythm with no symptoms  Please call them with any question [phone]

## 2020-05-23 NOTE — Telephone Encounter (Signed)
Spoke with [place] calls doctor and she just stated that it was irregular rhythm. She stated that she believes it is PVCs that she is hearing but can not be certain without having the EKG done and they do not have that ability. Pt is not able to come in until Monday. I have scheduled pt with Dr. Caryl Bis on Monday. Pt stated that he is not having any symptoms of feeling like his heart is racing, skipping, and no sobr.

## 2020-05-27 ENCOUNTER — Ambulatory Visit (INDEPENDENT_AMBULATORY_CARE_PROVIDER_SITE_OTHER): Payer: Medicare Other | Admitting: Family Medicine

## 2020-05-27 ENCOUNTER — Other Ambulatory Visit: Payer: Self-pay

## 2020-05-27 ENCOUNTER — Encounter: Payer: Self-pay | Admitting: Family Medicine

## 2020-05-27 VITALS — BP 134/80 | HR 66 | Temp 97.4°F | Ht 72.0 in | Wt 206.8 lb

## 2020-05-27 DIAGNOSIS — I499 Cardiac arrhythmia, unspecified: Secondary | ICD-10-CM

## 2020-05-27 NOTE — Patient Instructions (Signed)
Nice to see you. We will contact you with your labs. If you develop palpitations, odd heartbeats, chest pain, shortness of breath, or lightheadedness please let us know immediately.

## 2020-05-27 NOTE — Assessment & Plan Note (Signed)
Patient with PACs versus sinus arrhythmia.  Patient is asymptomatic.  Discussed that there is no additional intervention for this at this time and he should monitor for symptoms.  We will check electrolytes to ensure that those are not out of balance.

## 2020-05-27 NOTE — Progress Notes (Signed)
Tommi Rumps, MD Phone: [phone]  Bobby Murillo is a 79 y.o. male who presents today for same day visit.  Irregular heart rhythm: This was noted on a housecall by another physician.  They did not have the ability to do an EKG and recommended he have one completed.  The patient has not had any palpitations, chest pain, or shortness of breath.  Social History   Tobacco Use  Smoking Status Never Smoker  Smokeless Tobacco Never Used    Current Outpatient Medications on File Prior to Visit  Medication Sig Dispense Refill  . acetaminophen (TYLENOL) 325 MG tablet Take 650 mg by mouth every 6 (six) hours as needed.    . ALPRAZolam (XANAX) 0.5 MG tablet TAKE 1 TABLET BY MOUTH AT BEDTIME AS NEEDED FOR SLEEP (Patient taking differently: Take 0.25 mg by mouth at bedtime as needed.) 30 tablet 5  . Ascorbic Acid (VITAMIN C PO) Take by mouth.    . Aspirin Buf,AlHyd-MgHyd-CaCar, (ASCRIPTIN) 325 MG TABS Take 1 tablet by mouth daily. 360 tablet 0  . atorvastatin (LIPITOR) 20 MG tablet Take 1 tablet (20 mg total) by mouth 2 (two) times a week. 26 tablet 3  . Cyanocobalamin (B-12 PO) Take by mouth.    . ezetimibe (ZETIA) 10 MG tablet Take 1 tablet by mouth once daily 90 tablet 3  . fluticasone (FLONASE) 50 MCG/ACT nasal spray Place 2 sprays into both nostrils daily. 16 g 6  . Glucosamine-Chondroitin (GLUCOSAMINE CHONDR COMPLEX PO) Take 1 tablet by mouth daily. tab by mouth daily    . losartan (COZAAR) 50 MG tablet Take 1 tablet by mouth once daily 90 tablet 0  . polycarbophil (FIBERCON) 625 MG tablet Take 625 mg by mouth daily. Reported on 05/29/2015    . TURMERIC PO Take by mouth.     No current facility-administered medications on file prior to visit.     ROS see history of present illness  Objective  Physical Exam Vitals:   05/27/20 1525  BP: 134/80  Pulse: 66  Temp: (!) 97.4 F (36.3 C)  SpO2: 95%    BP Readings from Last 3 Encounters:  05/27/20 134/80  05/06/20 (!) 140/92   04/24/20 136/84   Wt Readings from Last 3 Encounters:  05/27/20 206 lb 12.8 oz (93.8 kg)  05/06/20 205 lb 9.6 oz (93.3 kg)  04/24/20 206 lb 4 oz (93.6 kg)    Physical Exam Constitutional:      General: He is not in acute distress.    Appearance: He is not diaphoretic.  Cardiovascular:     Rate and Rhythm: Normal rate.     Heart sounds: Normal heart sounds.     Comments: Sounds to have regular rhythm with ectopic beats Pulmonary:     Effort: Pulmonary effort is normal.     Breath sounds: Normal breath sounds.  Skin:    General: Skin is warm and dry.  Neurological:     Mental Status: He is alert.    EKG: Sinus rhythm with either sinus arrhythmia or PACs, no ischemic changes, rate 64  Assessment/Plan: Please see individual problem list.  Problem List Items Addressed This Visit    Irregular heart rhythm - Primary    Patient with PACs versus sinus arrhythmia.  Patient is asymptomatic.  Discussed that there is no additional intervention for this at this time and he should monitor for symptoms.  We will check electrolytes to ensure that those are not out of balance.  Relevant Orders   EKG [id number]   Basic Metabolic Panel (BMET)      This visit occurred during the SARS-CoV-2 public health emergency.  Safety protocols were in place, including screening questions prior to the visit, additional usage of staff PPE, and extensive cleaning of exam room while observing appropriate contact time as indicated for disinfecting solutions.    Tommi Rumps, MD [place]

## 2020-05-28 LAB — BASIC METABOLIC PANEL
BUN: 22 mg/dL (ref 6–23)
CO2: 25 mEq/L (ref 19–32)
Calcium: 9.3 mg/dL (ref 8.4–10.5)
Chloride: 105 mEq/L (ref 96–112)
Creatinine, Ser: 1.32 mg/dL (ref 0.40–1.50)
GFR: 51.87 mL/min — ABNORMAL LOW (ref >60.00)
Glucose, Bld: 114 mg/dL — ABNORMAL HIGH (ref 70–99)
Potassium: 4.3 mEq/L (ref 3.5–5.1)
Sodium: 139 mEq/L (ref 135–145)

## 2020-06-02 ENCOUNTER — Other Ambulatory Visit: Payer: Self-pay | Admitting: Internal Medicine

## 2020-06-24 DIAGNOSIS — H524 Presbyopia: Secondary | ICD-10-CM | POA: Diagnosis not present

## 2020-06-24 DIAGNOSIS — H02831 Dermatochalasis of right upper eyelid: Secondary | ICD-10-CM | POA: Diagnosis not present

## 2020-06-24 DIAGNOSIS — H2513 Age-related nuclear cataract, bilateral: Secondary | ICD-10-CM | POA: Diagnosis not present

## 2020-06-24 DIAGNOSIS — H52223 Regular astigmatism, bilateral: Secondary | ICD-10-CM | POA: Diagnosis not present

## 2020-06-24 DIAGNOSIS — H02834 Dermatochalasis of left upper eyelid: Secondary | ICD-10-CM | POA: Diagnosis not present

## 2020-06-24 DIAGNOSIS — H25013 Cortical age-related cataract, bilateral: Secondary | ICD-10-CM | POA: Diagnosis not present

## 2020-07-05 DIAGNOSIS — I10 Essential (primary) hypertension: Secondary | ICD-10-CM | POA: Diagnosis not present

## 2020-07-05 DIAGNOSIS — H25013 Cortical age-related cataract, bilateral: Secondary | ICD-10-CM | POA: Diagnosis not present

## 2020-07-05 DIAGNOSIS — H25043 Posterior subcapsular polar age-related cataract, bilateral: Secondary | ICD-10-CM | POA: Diagnosis not present

## 2020-07-05 DIAGNOSIS — H2513 Age-related nuclear cataract, bilateral: Secondary | ICD-10-CM | POA: Diagnosis not present

## 2020-07-05 DIAGNOSIS — H2511 Age-related nuclear cataract, right eye: Secondary | ICD-10-CM | POA: Diagnosis not present

## 2020-08-08 ENCOUNTER — Other Ambulatory Visit: Payer: Self-pay | Admitting: Internal Medicine

## 2020-08-08 NOTE — Telephone Encounter (Signed)
RX Refill:xanax Last Seen:05-06-20 Last ordered:02-09-20

## 2020-09-06 ENCOUNTER — Other Ambulatory Visit: Payer: Self-pay | Admitting: Internal Medicine

## 2020-09-27 DIAGNOSIS — H25013 Cortical age-related cataract, bilateral: Secondary | ICD-10-CM | POA: Diagnosis not present

## 2020-09-27 DIAGNOSIS — H2513 Age-related nuclear cataract, bilateral: Secondary | ICD-10-CM | POA: Diagnosis not present

## 2020-09-28 ENCOUNTER — Other Ambulatory Visit: Payer: Self-pay | Admitting: Internal Medicine

## 2020-10-30 DIAGNOSIS — M170 Bilateral primary osteoarthritis of knee: Secondary | ICD-10-CM | POA: Insufficient documentation

## 2020-10-30 DIAGNOSIS — M7662 Achilles tendinitis, left leg: Secondary | ICD-10-CM | POA: Insufficient documentation

## 2020-10-30 DIAGNOSIS — M13812 Other specified arthritis, left shoulder: Secondary | ICD-10-CM | POA: Diagnosis not present

## 2020-10-30 DIAGNOSIS — M19012 Primary osteoarthritis, left shoulder: Secondary | ICD-10-CM | POA: Insufficient documentation

## 2020-11-05 DIAGNOSIS — R42 Dizziness and giddiness: Secondary | ICD-10-CM

## 2020-11-08 MED ORDER — FLUTICASONE PROPIONATE 50 MCG/ACT NA SUSP: 2.0000 | Freq: Every day | NASAL | 6 refills | Status: DC

## 2020-12-03 NOTE — Telephone Encounter (Signed)
Patient calling back in with Vertigo symptoms. States he was recommended by Dr Derrel Nip to make an appointment.   No available appointments until 01/02/21 with Dr Derrel Nip. Please advise

## 2020-12-04 ENCOUNTER — Telehealth: Payer: Self-pay

## 2020-12-04 NOTE — Telephone Encounter (Signed)
Pt called back in to office about having the continued vertigo symptoms. Pt stated that he is using the flonase and it is helping. He stated that when he called back yesterday he was actually calling about his vision. He stated that he has noticed that when reading he can't see the end of the print. He also stated that in the right eye he sees "big things not floaters" in his peripheral vision. He stated that there has been times where he thought that his wife was standing beside him but when he turns his head to look nothing is there. He stated that he is supposed to have cataract surgery in the next couple of weeks. These symptoms come and go and the lose of vision when reading started about 3 days ago and the seeing big objects in right side peripheral vision has been going on for months. He stated that he has addressed these symptoms with an eye doctor and he stated that they have blown them off like nothing is wrong.

## 2020-12-04 NOTE — Telephone Encounter (Signed)
See telephone encounter. I spoke with pt on the phone.

## 2020-12-04 NOTE — Telephone Encounter (Signed)
The loss of vision that he experienced 3 days ago needs to be addressed with his doctor.  Floaters are not the same issue

## 2020-12-04 NOTE — Telephone Encounter (Signed)
Pt was advised that he needs to call his eye doctor to address. Pt gave a verbal understanding.

## 2020-12-20 DIAGNOSIS — I10 Essential (primary) hypertension: Secondary | ICD-10-CM | POA: Diagnosis not present

## 2020-12-20 DIAGNOSIS — H2513 Age-related nuclear cataract, bilateral: Secondary | ICD-10-CM | POA: Diagnosis not present

## 2020-12-20 DIAGNOSIS — H2511 Age-related nuclear cataract, right eye: Secondary | ICD-10-CM | POA: Diagnosis not present

## 2020-12-23 ENCOUNTER — Other Ambulatory Visit: Payer: Self-pay

## 2020-12-23 ENCOUNTER — Ambulatory Visit (INDEPENDENT_AMBULATORY_CARE_PROVIDER_SITE_OTHER): Payer: Medicare Other | Admitting: Internal Medicine

## 2020-12-23 VITALS — BP 138/80 | HR 50 | Temp 97.9°F | Ht 72.0 in | Wt 202.2 lb

## 2020-12-23 DIAGNOSIS — R2689 Other abnormalities of gait and mobility: Secondary | ICD-10-CM | POA: Diagnosis not present

## 2020-12-23 DIAGNOSIS — Z23 Encounter for immunization: Secondary | ICD-10-CM

## 2020-12-23 DIAGNOSIS — R42 Dizziness and giddiness: Secondary | ICD-10-CM | POA: Diagnosis not present

## 2020-12-23 DIAGNOSIS — M791 Myalgia, unspecified site: Secondary | ICD-10-CM

## 2020-12-23 DIAGNOSIS — E785 Hyperlipidemia, unspecified: Secondary | ICD-10-CM | POA: Diagnosis not present

## 2020-12-23 DIAGNOSIS — R5383 Other fatigue: Secondary | ICD-10-CM

## 2020-12-23 DIAGNOSIS — N1831 Chronic kidney disease, stage 3a: Secondary | ICD-10-CM | POA: Diagnosis not present

## 2020-12-23 DIAGNOSIS — R001 Bradycardia, unspecified: Secondary | ICD-10-CM | POA: Diagnosis not present

## 2020-12-23 DIAGNOSIS — T466X5A Adverse effect of antihyperlipidemic and antiarteriosclerotic drugs, initial encounter: Secondary | ICD-10-CM

## 2020-12-23 DIAGNOSIS — R419 Unspecified symptoms and signs involving cognitive functions and awareness: Secondary | ICD-10-CM

## 2020-12-23 DIAGNOSIS — I10 Essential (primary) hypertension: Secondary | ICD-10-CM | POA: Diagnosis not present

## 2020-12-23 DIAGNOSIS — J309 Allergic rhinitis, unspecified: Secondary | ICD-10-CM | POA: Diagnosis not present

## 2020-12-23 LAB — CBC WITH DIFFERENTIAL/PLATELET
Basophils Absolute: 0 10*3/uL (ref 0.0–0.1)
Basophils Relative: 0.6 % (ref 0.0–3.0)
Eosinophils Absolute: 0.1 10*3/uL (ref 0.0–0.7)
Eosinophils Relative: 1.5 % (ref 0.0–5.0)
HCT: 51.5 % (ref 39.0–52.0)
Hemoglobin: 17.2 g/dL — ABNORMAL HIGH (ref 13.0–17.0)
Lymphocytes Relative: 12.3 % (ref 12.0–46.0)
Lymphs Abs: 1 10*3/uL (ref 0.7–4.0)
MCHC: 33.5 g/dL (ref 30.0–36.0)
MCV: 92.9 fl (ref 78.0–100.0)
Monocytes Absolute: 0.7 10*3/uL (ref 0.1–1.0)
Monocytes Relative: 9.3 % (ref 3.0–12.0)
Neutro Abs: 6.1 10*3/uL (ref 1.4–7.7)
Neutrophils Relative %: 76.3 % (ref 43.0–77.0)
Platelets: 228 10*3/uL (ref 150.0–400.0)
RBC: 5.54 Mil/uL (ref 4.22–5.81)
RDW: 13.9 % (ref 11.5–15.5)
WBC: 8 10*3/uL (ref 4.0–10.5)

## 2020-12-23 LAB — COMPREHENSIVE METABOLIC PANEL
ALT: 31 U/L (ref 0–53)
AST: 25 U/L (ref 0–37)
Albumin: 4.1 g/dL (ref 3.5–5.2)
Alkaline Phosphatase: 93 U/L (ref 39–117)
BUN: 16 mg/dL (ref 6–23)
CO2: 27 mEq/L (ref 19–32)
Calcium: 9.7 mg/dL (ref 8.4–10.5)
Chloride: 105 mEq/L (ref 96–112)
Creatinine, Ser: 1.24 mg/dL (ref 0.40–1.50)
GFR: 55.68 mL/min — ABNORMAL LOW (ref >60.00)
Glucose, Bld: 82 mg/dL (ref 70–99)
Potassium: 4.4 mEq/L (ref 3.5–5.1)
Sodium: 141 mEq/L (ref 135–145)
Total Bilirubin: 2 mg/dL — ABNORMAL HIGH (ref 0.2–1.2)
Total Protein: 6.4 g/dL (ref 6.0–8.3)

## 2020-12-23 LAB — LIPID PANEL
Cholesterol: 159 mg/dL (ref 0–200)
HDL: 68.1 mg/dL (ref >39.00)
LDL Cholesterol: 75 mg/dL (ref 0–99)
NonHDL: 91.22
Total CHOL/HDL Ratio: 2
Triglycerides: 80 mg/dL (ref 0.0–149.0)
VLDL: 16 mg/dL (ref 0.0–40.0)

## 2020-12-23 LAB — B12 AND FOLATE PANEL
Folate: 6.9 ng/mL (ref >5.9)
Vitamin B-12: 784 pg/mL (ref 211–911)

## 2020-12-23 LAB — TSH: TSH: 2.19 u[IU]/mL (ref 0.35–5.50)

## 2020-12-23 NOTE — Assessment & Plan Note (Signed)
Review of MRI suggests vascular origin (chronic smalll vessel ischemic disease nad atrphy). Symptoms are limited t retrieval of names.  He is very worried about AD given his strong  FA.  neurology referral to Dr Manuella Ghazi offered,  But given the lack of good treatments,  He has deferred

## 2020-12-23 NOTE — Progress Notes (Signed)
Subjective:  Patient ID: Bobby Murillo, male    DOB: June 23, 1940  Age: 80 y.o. [name]: [id number]  CC: The primary encounter diagnosis was Need for immunization against influenza. Diagnoses of Stage 3a chronic kidney disease ([place]), Other fatigue, Essential hypertension, Hyperlipidemia LDL goal <100, Vestibular dizziness involving both inner ears, Cognitive complaints with normal neuropsychological exam, Bradycardia, sinus, Myalgia due to statin, Balance problem, and Allergic rhinitis, unspecified seasonality, unspecified trigger were also pertinent to this visit.  HPI Bobby Murillo presents for follow up on multiple issues including 1) recurrent  dizziness , 2) joint pain .  3) cognitive concerns,  4) fatigue   This visit occurred during the SARS-CoV-2 public health emergency.  Safety protocols were in place, including screening questions prior to the visit, additional usage of staff PPE, and extensive cleaning of exam room while observing appropriate contact time as indicated for disinfecting solutions.   1)  Denies true vertigo.  Room does not spin,  but he feels like he is losing his balance during walking,  changing position.  Has not fallen. During trip to [place] he was particularly symptomatic after a short boat ride ,  o prior ENT evaluation .  Has stopped plying tennis and gold due to balance issues.    2) Bilateral cataracts.  Rolanda Jay to take the right one off first.  [facility]  in [place] .  3) Memory problems:  drawing blanks on people's names., objects. Comes up with them hours later. . No progression .  Strong maternal FH of Alzheimers at early age in   59) Left shoulder pain : received subacromial steroid injection  sept 11 by KK. Helped a lot  5) after seeing podiatry for left foot pain.  Surgery recommended but deferred by patient. He has had it addressed by orthopedist .  KK  trying to address with an orthotic /wedge in shoe  6) Bilateral DJD knees, severe, Low energy noticed  by wife. No longer doing Yardwork .  Not walking on a regular basis,  just occasionally . Stopped playing tennis  in the  [place]  and  Golf 6 weeks ago due to balance issues.         Outpatient Medications Prior to Visit  Medication Sig Dispense Refill   acetaminophen (TYLENOL) 325 MG tablet Take 650 mg by mouth every 6 (six) hours as needed.     ALPRAZolam (XANAX) 0.5 MG tablet TAKE 1 TABLET BY MOUTH AT BEDTIME AS NEEDED FOR SLEEP 30 tablet 5   Ascorbic Acid (VITAMIN C PO) Take by mouth.     Aspirin Buf,AlHyd-MgHyd-CaCar, (ASCRIPTIN) 325 MG TABS Take 1 tablet by mouth daily. 360 tablet 0   atorvastatin (LIPITOR) 20 MG tablet Take 1 tablet (20 mg total) by mouth 2 (two) times a week. 26 tablet 3   Cyanocobalamin (B-12 PO) Take by mouth.     ezetimibe (ZETIA) 10 MG tablet Take 1 tablet by mouth once daily 90 tablet 0   fluticasone (FLONASE) 50 MCG/ACT nasal spray Place 2 sprays into both nostrils daily. 16 g 6   Glucosamine-Chondroitin (GLUCOSAMINE CHONDR COMPLEX PO) Take 1 tablet by mouth daily. tab by mouth daily     losartan (COZAAR) 50 MG tablet Take 1 tablet by mouth once daily 90 tablet 1   polycarbophil (FIBERCON) 625 MG tablet Take 625 mg by mouth daily. Reported on 05/29/2015     TURMERIC PO Take by mouth.     No facility-administered medications prior to visit.  Review of Systems;  Patient denies headache, fevers, malaise, unintentional weight loss, skin rash, eye pain, sinus congestion and sinus pain, sore throat, dysphagia,  hemoptysis , cough, dyspnea, wheezing, chest pain, palpitations, orthopnea, edema, abdominal pain, nausea, melena, diarrhea, constipation, flank pain, dysuria, hematuria, urinary  Frequency, nocturia, numbness, tingling, seizures,  Focal weakness, Loss of consciousness,  Tremor, insomnia, depression, anxiety, and suicidal ideation.      Objective:  BP 138/80   Pulse (!) 50   Temp 97.9 F (36.6 C) (Oral)   Ht 6' (1.829 m)   Wt 202 lb 3.2 oz (91.7  kg)   SpO2 98%   BMI 27.42 kg/m   BP Readings from Last 3 Encounters:  12/23/20 138/80  05/27/20 134/80  05/06/20 (!) 140/92    Wt Readings from Last 3 Encounters:  12/23/20 202 lb 3.2 oz (91.7 kg)  05/27/20 206 lb 12.8 oz (93.8 kg)  05/06/20 205 lb 9.6 oz (93.3 kg)    General appearance: alert, cooperative and appears stated age Ears: normal TM's and external ear canals both ears Throat: lips, mucosa, and tongue normal; teeth and gums normal Neck: no adenopathy, no carotid bruit, supple, symmetrical, trachea midline and thyroid not enlarged, symmetric, no tenderness/mass/nodules Back: symmetric, no curvature. ROM normal. No CVA tenderness. Lungs: clear to auscultation bilaterally Heart: regular rate and rhythm, S1, S2 normal, no murmur, click, rub or gallop Abdomen: soft, non-tender; bowel sounds normal; no masses,  no organomegaly Pulses: 2+ and symmetric Skin: Skin color, texture, turgor normal. No rashes or lesions Lymph nodes: Cervical, supraclavicular, and axillary nodes normal. Neuro:  awake and interactive with normal mood and affect. Higher cortical functions are normal. Speech is clear without word-finding difficulty or dysarthria. Extraocular movements are intact. Visual fields of both eyes are grossly intact. Sensation to light touch is grossly intact bilaterally of upper and lower extremities. Motor examination shows 4+/5 symmetric hand grip and upper extremity and 5/5 lower extremity strength. There is no pronation or drift. Gait is antalgic but not ataxic   Lab Results  Component Value Date   HGBA1C 5.8 06/09/2019   HGBA1C 5.9 05/08/2019    Lab Results  Component Value Date   CREATININE 1.32 05/27/2020   CREATININE 1.38 05/06/2020   CREATININE 1.22 04/24/2020    Lab Results  Component Value Date   WBC 7.5 04/24/2020   HGB 17.9 Repeated and verified X2. (H) 04/24/2020   HCT 52.7 Repeated and verified X2. (H) 04/24/2020   PLT 235.0 04/24/2020   GLUCOSE  114 (H) 05/27/2020   CHOL 213 (H) 05/06/2020   TRIG 124.0 05/06/2020   HDL 65.00 05/06/2020   LDLDIRECT 184.0 12/30/2015   LDLCALC 123 (H) 05/06/2020   ALT 29 05/06/2020   AST 23 05/06/2020   NA 139 05/27/2020   K 4.3 05/27/2020   CL 105 05/27/2020   CREATININE 1.32 05/27/2020   BUN 22 05/27/2020   CO2 25 05/27/2020   TSH 4.03 05/08/2019   PSA 0.00 (L) 05/06/2020   HGBA1C 5.8 06/09/2019   MICROALBUR <0.7 12/15/2019    MR Brain W Wo Contrast  Result Date: 05/07/2020 CLINICAL DATA:  New onset vertigo.  History of prostate cancer. EXAM: MRI HEAD WITHOUT AND WITH CONTRAST TECHNIQUE: Multiplanar, multiecho pulse sequences of the brain and surrounding structures were obtained without and with intravenous contrast. CONTRAST:  48mL GADAVIST GADOBUTROL 1 MMOL/ML IV SOLN COMPARISON:  None. FINDINGS: Brain: There is no evidence of an acute infarct, intracranial hemorrhage, mass, midline shift, or extra-axial  fluid collection. T2 hyperintensities in the cerebral white matter bilaterally are nonspecific but compatible with mild chronic small vessel ischemic disease. There is mild generalized cerebral atrophy. No abnormal enhancement is identified. Vascular: Major intracranial vascular flow voids are preserved. Skull and upper cervical spine: Unremarkable bone marrow signal. Moderate ligamentous thickening posterior to the dens without mass effect on the spinal cord. Sinuses/Orbits: Unremarkable orbits. Small mucous retention cysts in the maxillary sinuses. Minimal right ethmoid air cell mucosal thickening. Clear mastoid air cells. Other: None. IMPRESSION: 1. No acute intracranial abnormality or mass. 2. Mild chronic small vessel ischemic disease. Electronically Signed   By: Logan Bores M.D.   On: 05/07/2020 10:04    Assessment & Plan:   Problem List Items Addressed This Visit       Unprioritized   Hyperlipidemia LDL goal <100 (Chronic)    Managed with atorvastatin 20 mg two times weekly.  Zetia  was added for goal LDL < 100   Lab Results  Component Value Date   CHOL 213 (H) 05/06/2020   HDL 65.00 05/06/2020   LDLCALC 123 (H) 05/06/2020   LDLDIRECT 184.0 12/30/2015   TRIG 124.0 05/06/2020   CHOLHDL 3 05/06/2020         Relevant Orders   Lipid panel   Bradycardia, sinus (Chronic)    Evaluated in 2017 with 14 day Holter monitor.  Low of 37, high of 112 with activity       Essential hypertension    Well controlled on current regimen of  Losartan 50 mg daily . Renal function stable, no changes today.      Allergic rhinitis    Advised to continue Flonase and add zyrtec as this may be contributing to balance issues via eustachian tube dysfunction       Myalgia due to statin    Tolerating atorvastatin twice weekly       Stage 3a chronic kidney disease (HCC)    GFR decline noted previously on  fasting labs.  Repeat assessment due.     Lab Results  Component Value Date   CREATININE 1.32 05/27/2020   Lab Results  Component Value Date   NA 139 05/27/2020   K 4.3 05/27/2020   CL 105 05/27/2020   CO2 25 05/27/2020         Relevant Orders   CBC with Differential/Platelet   Iron, TIBC and Ferritin Panel   Comprehensive metabolic panel   Balance problem    Likely multifactorial,  With inner ear,  Proximal muscle weakness both contributing .  Referral to ENT and PT       Cognitive complaints with normal neuropsychological exam    Review of MRI suggests vascular origin (chronic smalll vessel ischemic disease nad atrphy). Symptoms are limited t retrieval of names.  He is very worried about AD given his strong  FA.  neurology referral to Dr Manuella Ghazi offered,  But given the lack of good treatments,  He has deferred       Other Visit Diagnoses     Need for immunization against influenza    -  Primary   Relevant Orders   Flu Vaccine QUAD High Dose(Fluad) (Completed)   Other fatigue       Relevant Orders   TSH   B12 and Folate Panel   Ambulatory referral to  Physical Therapy   Vestibular dizziness involving both inner ears       Relevant Orders   Ambulatory referral to ENT   Ambulatory referral to  Physical Therapy         I spent 40 minutes dedicated to the care of this patient on the date of this encounter to include pre-visit review of his medical history,  Face-to-face time with the patient , and post visit ordering of testing and therapeutics.     Follow-up: No follow-ups on file.   Crecencio Mc, MD

## 2020-12-23 NOTE — Assessment & Plan Note (Signed)
GFR decline noted previously on  fasting labs.  Repeat assessment due.     Lab Results  Component Value Date   CREATININE 1.32 05/27/2020   Lab Results  Component Value Date   NA 139 05/27/2020   K 4.3 05/27/2020   CL 105 05/27/2020   CO2 25 05/27/2020

## 2020-12-23 NOTE — Assessment & Plan Note (Addendum)
Managed with atorvastatin 20 mg two times weekly.  Zetia was added for goal LDL < 100   Lab Results  Component Value Date   CHOL 213 (H) 05/06/2020   HDL 65.00 05/06/2020   LDLCALC 123 (H) 05/06/2020   LDLDIRECT 184.0 12/30/2015   TRIG 124.0 05/06/2020   CHOLHDL 3 05/06/2020

## 2020-12-23 NOTE — Assessment & Plan Note (Signed)
Well controlled on current regimen of  Losartan 50 mg daily . Renal function stable, no changes today.

## 2020-12-23 NOTE — Assessment & Plan Note (Signed)
Evaluated in 2017 with 14 day Holter monitor.  Low of 37, high of 112 with activity

## 2020-12-23 NOTE — Patient Instructions (Addendum)
Continue Flonase daily Add once daily zyrtec.    This combination  regimen is for your inner ear,  to  see what effect  it has on your balance issues   ENT referral and PT referral I process   I recommend you Start exercising at your club gym using the recumbent bike

## 2020-12-23 NOTE — Assessment & Plan Note (Signed)
Tolerating atorvastatin twice weekly

## 2020-12-23 NOTE — Assessment & Plan Note (Signed)
Likely multifactorial,  With inner ear,  Proximal muscle weakness both contributing .  Referral to ENT and PT

## 2020-12-23 NOTE — Assessment & Plan Note (Signed)
Advised to continue Flonase and add zyrtec as this may be contributing to balance issues via eustachian tube dysfunction

## 2020-12-24 LAB — IRON,TIBC AND FERRITIN PANEL
%SAT: 47 % (calc) (ref 20–48)
Ferritin: 109 ng/mL (ref 24–380)
Iron: 172 ug/dL (ref 50–180)
TIBC: 367 mcg/dL (calc) (ref 250–425)

## 2020-12-30 DIAGNOSIS — Z85828 Personal history of other malignant neoplasm of skin: Secondary | ICD-10-CM | POA: Diagnosis not present

## 2020-12-30 DIAGNOSIS — D2271 Melanocytic nevi of right lower limb, including hip: Secondary | ICD-10-CM | POA: Diagnosis not present

## 2020-12-30 DIAGNOSIS — D485 Neoplasm of uncertain behavior of skin: Secondary | ICD-10-CM | POA: Diagnosis not present

## 2020-12-30 DIAGNOSIS — L820 Inflamed seborrheic keratosis: Secondary | ICD-10-CM | POA: Diagnosis not present

## 2020-12-30 DIAGNOSIS — X32XXXA Exposure to sunlight, initial encounter: Secondary | ICD-10-CM | POA: Diagnosis not present

## 2020-12-30 DIAGNOSIS — D2262 Melanocytic nevi of left upper limb, including shoulder: Secondary | ICD-10-CM | POA: Diagnosis not present

## 2020-12-30 DIAGNOSIS — L570 Actinic keratosis: Secondary | ICD-10-CM | POA: Diagnosis not present

## 2020-12-30 DIAGNOSIS — D2261 Melanocytic nevi of right upper limb, including shoulder: Secondary | ICD-10-CM | POA: Diagnosis not present

## 2020-12-30 DIAGNOSIS — D0471 Carcinoma in situ of skin of right lower limb, including hip: Secondary | ICD-10-CM | POA: Diagnosis not present

## 2020-12-30 DIAGNOSIS — L538 Other specified erythematous conditions: Secondary | ICD-10-CM | POA: Diagnosis not present

## 2020-12-30 DIAGNOSIS — C4441 Basal cell carcinoma of skin of scalp and neck: Secondary | ICD-10-CM | POA: Diagnosis not present

## 2021-01-02 ENCOUNTER — Other Ambulatory Visit: Payer: Self-pay | Admitting: Internal Medicine

## 2021-01-03 ENCOUNTER — Emergency Department: Payer: Medicare Other

## 2021-01-03 ENCOUNTER — Observation Stay
Admission: EM | Admit: 2021-01-03 | Discharge: 2021-01-05 | Disposition: A | Discharge status: Home / Self Care | Payer: Medicare Other | Attending: Student | Admitting: Student

## 2021-01-03 ENCOUNTER — Observation Stay: Payer: Medicare Other

## 2021-01-03 ENCOUNTER — Other Ambulatory Visit: Payer: Self-pay

## 2021-01-03 DIAGNOSIS — R2681 Unsteadiness on feet: Secondary | ICD-10-CM | POA: Insufficient documentation

## 2021-01-03 DIAGNOSIS — E782 Mixed hyperlipidemia: Secondary | ICD-10-CM | POA: Diagnosis not present

## 2021-01-03 DIAGNOSIS — Z8546 Personal history of malignant neoplasm of prostate: Secondary | ICD-10-CM | POA: Insufficient documentation

## 2021-01-03 DIAGNOSIS — G459 Transient cerebral ischemic attack, unspecified: Secondary | ICD-10-CM | POA: Diagnosis present

## 2021-01-03 DIAGNOSIS — Z79899 Other long term (current) drug therapy: Secondary | ICD-10-CM | POA: Diagnosis not present

## 2021-01-03 DIAGNOSIS — I6529 Occlusion and stenosis of unspecified carotid artery: Secondary | ICD-10-CM | POA: Diagnosis present

## 2021-01-03 DIAGNOSIS — I63533 Cerebral infarction due to unspecified occlusion or stenosis of bilateral posterior cerebral arteries: Secondary | ICD-10-CM | POA: Diagnosis not present

## 2021-01-03 DIAGNOSIS — I63312 Cerebral infarction due to thrombosis of left middle cerebral artery: Secondary | ICD-10-CM | POA: Diagnosis present

## 2021-01-03 DIAGNOSIS — D751 Secondary polycythemia: Secondary | ICD-10-CM | POA: Diagnosis not present

## 2021-01-03 DIAGNOSIS — I499 Cardiac arrhythmia, unspecified: Secondary | ICD-10-CM | POA: Diagnosis not present

## 2021-01-03 DIAGNOSIS — I10 Essential (primary) hypertension: Secondary | ICD-10-CM | POA: Diagnosis present

## 2021-01-03 DIAGNOSIS — F419 Anxiety disorder, unspecified: Secondary | ICD-10-CM | POA: Diagnosis not present

## 2021-01-03 DIAGNOSIS — N1831 Chronic kidney disease, stage 3a: Secondary | ICD-10-CM | POA: Diagnosis present

## 2021-01-03 DIAGNOSIS — E785 Hyperlipidemia, unspecified: Secondary | ICD-10-CM | POA: Diagnosis not present

## 2021-01-03 DIAGNOSIS — Z20822 Contact with and (suspected) exposure to covid-19: Secondary | ICD-10-CM | POA: Insufficient documentation

## 2021-01-03 DIAGNOSIS — I129 Hypertensive chronic kidney disease with stage 1 through stage 4 chronic kidney disease, or unspecified chronic kidney disease: Secondary | ICD-10-CM | POA: Insufficient documentation

## 2021-01-03 DIAGNOSIS — Z85828 Personal history of other malignant neoplasm of skin: Secondary | ICD-10-CM | POA: Insufficient documentation

## 2021-01-03 DIAGNOSIS — R42 Dizziness and giddiness: Secondary | ICD-10-CM | POA: Diagnosis not present

## 2021-01-03 DIAGNOSIS — R6889 Other general symptoms and signs: Secondary | ICD-10-CM | POA: Diagnosis not present

## 2021-01-03 DIAGNOSIS — R001 Bradycardia, unspecified: Secondary | ICD-10-CM | POA: Diagnosis not present

## 2021-01-03 DIAGNOSIS — R0902 Hypoxemia: Secondary | ICD-10-CM | POA: Diagnosis not present

## 2021-01-03 DIAGNOSIS — R29818 Other symptoms and signs involving the nervous system: Secondary | ICD-10-CM | POA: Diagnosis not present

## 2021-01-03 DIAGNOSIS — I6523 Occlusion and stenosis of bilateral carotid arteries: Secondary | ICD-10-CM | POA: Diagnosis not present

## 2021-01-03 DIAGNOSIS — I639 Cerebral infarction, unspecified: Principal | ICD-10-CM | POA: Insufficient documentation

## 2021-01-03 DIAGNOSIS — D72829 Elevated white blood cell count, unspecified: Secondary | ICD-10-CM | POA: Diagnosis present

## 2021-01-03 DIAGNOSIS — I63513 Cerebral infarction due to unspecified occlusion or stenosis of bilateral middle cerebral arteries: Secondary | ICD-10-CM | POA: Diagnosis not present

## 2021-01-03 DIAGNOSIS — Z743 Need for continuous supervision: Secondary | ICD-10-CM | POA: Diagnosis not present

## 2021-01-03 DIAGNOSIS — R4789 Other speech disturbances: Secondary | ICD-10-CM | POA: Diagnosis present

## 2021-01-03 DIAGNOSIS — Q211 Atrial septal defect, unspecified: Secondary | ICD-10-CM

## 2021-01-03 HISTORY — DX: Cerebral infarction, unspecified: I63.9

## 2021-01-03 LAB — APTT: aPTT: 31 seconds (ref 24–36)

## 2021-01-03 LAB — DIFFERENTIAL
Abs Immature Granulocytes: 0.06 10*3/uL (ref 0.00–0.07)
Basophils Absolute: 0.1 10*3/uL (ref 0.0–0.1)
Basophils Relative: 0 %
Eosinophils Absolute: 0.1 10*3/uL (ref 0.0–0.5)
Eosinophils Relative: 1 %
Immature Granulocytes: 1 %
Lymphocytes Relative: 6 %
Lymphs Abs: 0.8 10*3/uL (ref 0.7–4.0)
Monocytes Absolute: 0.9 10*3/uL (ref 0.1–1.0)
Monocytes Relative: 7 %
Neutro Abs: 10.2 10*3/uL — ABNORMAL HIGH (ref 1.7–7.7)
Neutrophils Relative %: 85 %

## 2021-01-03 LAB — COMPREHENSIVE METABOLIC PANEL
ALT: 33 U/L (ref 0–44)
AST: 34 U/L (ref 15–41)
Albumin: 4 g/dL (ref 3.5–5.0)
Alkaline Phosphatase: 87 U/L (ref 38–126)
Anion gap: 9 (ref 5–15)
BUN: 18 mg/dL (ref 8–23)
CO2: 26 mmol/L (ref 22–32)
Calcium: 9.6 mg/dL (ref 8.9–10.3)
Chloride: 103 mmol/L (ref 98–111)
Creatinine, Ser: 1.25 mg/dL — ABNORMAL HIGH (ref 0.61–1.24)
GFR, Estimated: 59 mL/min — ABNORMAL LOW (ref >60)
Glucose, Bld: 94 mg/dL (ref 70–99)
Potassium: 4.1 mmol/L (ref 3.5–5.1)
Sodium: 138 mmol/L (ref 135–145)
Total Bilirubin: 1.7 mg/dL — ABNORMAL HIGH (ref 0.3–1.2)
Total Protein: 7.2 g/dL (ref 6.5–8.1)

## 2021-01-03 LAB — PROTIME-INR
INR: 1 (ref 0.8–1.2)
Prothrombin Time: 13.6 seconds (ref 11.4–15.2)

## 2021-01-03 LAB — CBC
HCT: 50.2 % (ref 39.0–52.0)
Hemoglobin: 17.9 g/dL — ABNORMAL HIGH (ref 13.0–17.0)
MCH: 32.5 pg (ref 26.0–34.0)
MCHC: 35.7 g/dL (ref 30.0–36.0)
MCV: 91.3 fL (ref 80.0–100.0)
Platelets: 215 10*3/uL (ref 150–400)
RBC: 5.5 MIL/uL (ref 4.22–5.81)
RDW: 12.7 % (ref 11.5–15.5)
WBC: 12 10*3/uL — ABNORMAL HIGH (ref 4.0–10.5)
nRBC: 0 % (ref 0.0–0.2)

## 2021-01-03 LAB — CBG MONITORING, ED: Glucose-Capillary: 105 mg/dL — ABNORMAL HIGH (ref 70–99)

## 2021-01-03 LAB — RESP PANEL BY RT-PCR (FLU A&B, COVID) ARPGX2
Influenza A by PCR: NEGATIVE
Influenza B by PCR: NEGATIVE
SARS Coronavirus 2 by RT PCR: NEGATIVE

## 2021-01-03 LAB — LIPID PANEL
Cholesterol: 186 mg/dL (ref 0–200)
HDL: 64 mg/dL (ref >40)
LDL Cholesterol: 103 mg/dL — ABNORMAL HIGH (ref 0–99)
Total CHOL/HDL Ratio: 2.9 RATIO
Triglycerides: 93 mg/dL (ref <150)
VLDL: 19 mg/dL (ref 0–40)

## 2021-01-03 MED ORDER — ENOXAPARIN SODIUM 40 MG/0.4ML IJ SOSY
40.0000 mg | PREFILLED_SYRINGE | INTRAMUSCULAR | Status: DC
  Administered 2021-01-03 – 2021-01-04 (×2): 40 mg via SUBCUTANEOUS
  Filled 2021-01-03 (×2): qty 0.4

## 2021-01-03 MED ORDER — ACETAMINOPHEN 325 MG PO TABS: 650.0000 mg | ORAL_TABLET | ORAL | Status: DC | PRN

## 2021-01-03 MED ORDER — ACETAMINOPHEN 160 MG/5ML PO SOLN
650.0000 mg | ORAL | Status: DC | PRN
  Filled 2021-01-03: qty 20.3

## 2021-01-03 MED ORDER — ALPRAZOLAM 0.25 MG PO TABS
0.2500 mg | ORAL_TABLET | Freq: Once | ORAL | Status: AC
  Administered 2021-01-03: 0.25 mg via ORAL
  Filled 2021-01-03: qty 1

## 2021-01-03 MED ORDER — CLOPIDOGREL BISULFATE 75 MG PO TABS
75.0000 mg | ORAL_TABLET | Freq: Every day | ORAL | Status: DC
  Administered 2021-01-04 – 2021-01-05 (×2): 75 mg via ORAL
  Filled 2021-01-03 (×3): qty 1

## 2021-01-03 MED ORDER — CLOPIDOGREL BISULFATE 75 MG PO TABS
300.0000 mg | ORAL_TABLET | Freq: Once | ORAL | Status: AC
  Administered 2021-01-03: 300 mg via ORAL
  Filled 2021-01-03: qty 4

## 2021-01-03 MED ORDER — SENNOSIDES-DOCUSATE SODIUM 8.6-50 MG PO TABS: 1.0000 | ORAL_TABLET | Freq: Every evening | ORAL | Status: DC | PRN

## 2021-01-03 MED ORDER — SODIUM CHLORIDE 0.9% FLUSH
3.0000 mL | Freq: Once | INTRAVENOUS | Status: AC
Start: 2021-01-03 — End: 2021-01-03
  Administered 2021-01-03: 3 mL via INTRAVENOUS

## 2021-01-03 MED ORDER — STROKE: EARLY STAGES OF RECOVERY BOOK: Freq: Once | Status: AC

## 2021-01-03 MED ORDER — ASPIRIN EC 81 MG PO TBEC
81.0000 mg | DELAYED_RELEASE_TABLET | Freq: Every day | ORAL | Status: DC
  Administered 2021-01-04 – 2021-01-05 (×2): 81 mg via ORAL
  Filled 2021-01-03 (×3): qty 1

## 2021-01-03 MED ORDER — ASPIRIN EC 81 MG PO TBEC: 81.0000 mg | DELAYED_RELEASE_TABLET | Freq: Every day | ORAL | Status: DC

## 2021-01-03 MED ORDER — MECLIZINE HCL 25 MG PO TABS
25.0000 mg | ORAL_TABLET | Freq: Two times a day (BID) | ORAL | Status: DC | PRN
  Filled 2021-01-03: qty 1

## 2021-01-03 MED ORDER — ACETAMINOPHEN 650 MG RE SUPP: 650.0000 mg | RECTAL | Status: DC | PRN

## 2021-01-03 NOTE — ED Notes (Signed)
Pt assisted to toilet. Then back to bed and placed back on cardiac monitor.

## 2021-01-03 NOTE — ED Triage Notes (Addendum)
Pt comes into the ED via EMS from home with c/o sudden onset word garbage , had 2 episodes lasting 2-3 minutes apart, 1st around 1400 and 2nd around 1415.. pt is a/ox 4 on arrival.  Speech is clear at present, pt denies any numbness, weakness or other sx 180/98 HR55 98%RA [id number] #[id number]

## 2021-01-03 NOTE — ED Notes (Signed)
RN to bedside.  MD at bedside.

## 2021-01-03 NOTE — ED Provider Notes (Signed)
[facility] Emergency Department Provider Note  ____________________________________________  Time seen: Approximately 3:20 PM  I have reviewed the triage vital signs and the nursing notes.   HISTORY  Chief Complaint Transient Ischemic Attack    HPI GEAN LAROSE is a 80 y.o. male with a past history of hypertension, hyperlipidemia and sinus bradycardia who comes ED due to garbled speech.  Last known well was about 12:00 PM at which time he had a few episodes of incoherent speech over the next 30 minutes that lasted 2 to 3 minutes at a time.  Denies weakness or paresthesia.  No vision change.  No thunderclap headache.  Symptoms have now resolved.    Past Medical History:  Diagnosis Date  . Abnormal LFTs   . Anxiety   . Basal cell carcinoma July 2012   left calf   . Hyperlipidemia   . prostate cancer 2010   Dr. Ronny Bacon  . Sinus bradycardia by electrocardiogram      Patient Active Problem List   Diagnosis Date Noted  . Balance problem 12/23/2020  . Irregular heart rhythm 05/27/2020  . Abdominal aortic atherosclerosis ([place]) 05/06/2020  . Myalgia due to statin 05/06/2020  . Vertigo 04/24/2020  . Allergic rhinitis 03/06/2020  . Ankle edema, bilateral 12/17/2019  . Rigidity of 1st MTP joint, left 08/07/2019  . Stage 3a chronic kidney disease ([place]) 08/07/2019  . Cognitive complaints with normal neuropsychological exam 02/21/2019  . Right hip pain 02/09/2018  . Polyarthralgia 02/08/2018  . Polycythemia 02/08/2018  . Insomnia due to psychological stress 02/02/2017  . Encounter for preventive health examination 02/02/2017  . Essential hypertension 06/01/2015  . Bradycardia, sinus 06/01/2015  . Encounter for Medicare annual wellness exam 12/02/2014  . Statin intolerance 11/03/2012  . Degenerative joint disease of knee 02/15/2011  . Hyperlipidemia LDL goal <100 02/15/2011  . History of prostate cancer   . Basal cell carcinoma      Past Surgical  History:  Procedure Laterality Date  . PROSTATECTOMY  11/2008   transurethral, radical, Dr. Darcus Austin     Prior to Admission medications   Medication Sig Start Date End Date Taking? Authorizing Provider  acetaminophen (TYLENOL) 325 MG tablet Take 650 mg by mouth every 6 (six) hours as needed.    [provider]  ALPRAZolam Duanne Moron) 0.5 MG tablet TAKE 1 TABLET BY MOUTH AT BEDTIME AS NEEDED FOR SLEEP 08/08/20   Crecencio Mc, MD  Ascorbic Acid (VITAMIN C PO) Take by mouth.    [provider]  Aspirin Buf,AlHyd-MgHyd-CaCar, (ASCRIPTIN) 325 MG TABS Take 1 tablet by mouth daily. 04/25/20   Crecencio Mc, MD  atorvastatin (LIPITOR) 20 MG tablet Take 1 tablet (20 mg total) by mouth 2 (two) times a week. 05/09/20   Crecencio Mc, MD  Cyanocobalamin (B-12 PO) Take by mouth.    [provider]  ezetimibe (ZETIA) 10 MG tablet Take 1 tablet by mouth once daily 01/03/21   Crecencio Mc, MD  fluticasone (FLONASE) 50 MCG/ACT nasal spray Place 2 sprays into both nostrils daily. 11/08/20   Crecencio Mc, MD  Glucosamine-Chondroitin (GLUCOSAMINE CHONDR COMPLEX PO) Take 1 tablet by mouth daily. tab by mouth daily 11/20/08   [provider]  losartan (COZAAR) 50 MG tablet Take 1 tablet by mouth once daily 09/09/20   Crecencio Mc, MD  polycarbophil (FIBERCON) 625 MG tablet Take 625 mg by mouth daily. Reported on 05/29/2015    [provider]  TURMERIC PO Take  by mouth.    [provider]     Allergies Alfuzosin and Pravastatin sodium   Family History  Problem Relation Age of Onset  . Alzheimer's disease Mother   . Stroke Father   . Prostate cancer Father   . Alzheimer's disease Sister   . Stroke Paternal Aunt   . Heart disease Paternal Uncle 74    Social History Social History   Tobacco Use  . Smoking status: Never  . Smokeless tobacco: Never  Vaping Use  . Vaping Use: Never used  Substance Use Topics  . Alcohol use: Yes    Alcohol/week:  8.0 standard drinks    Types: 4 Glasses of wine, 4 Shots of liquor per week    Comment: occasional  . Drug use: No    Review of Systems  Constitutional:   No fever or chills.  Positive for fatigue since yesterday ENT:   No sore throat. No rhinorrhea. Cardiovascular:   No chest pain or syncope. Respiratory:   No dyspnea or cough. Gastrointestinal:   Negative for abdominal pain, vomiting and diarrhea.  Musculoskeletal:   Negative for focal pain or swelling All other systems reviewed and are negative except as documented above in ROS and HPI.  ____________________________________________   PHYSICAL EXAM:  VITAL SIGNS: ED Triage Vitals  Enc Vitals Group     BP 01/03/21 1448 (!) 184/75     Pulse Rate 01/03/21 1448 (!) 48     Resp 01/03/21 1448 18     Temp 01/03/21 1448 97.6 F (36.4 C)     Temp Source 01/03/21 1448 Oral     SpO2 01/03/21 1448 97 %     Weight 01/03/21 1445 200 lb (90.7 kg)     Height 01/03/21 1445 6\' 1"  (1.854 m)     Head Circumference --      Peak Flow --      Pain Score 01/03/21 1445 0     Pain Loc --      Pain Edu? --      Excl. in [place]? --     Vital signs reviewed, nursing assessments reviewed.   Constitutional:   Alert and oriented. Non-toxic appearance. Eyes:   Conjunctivae are normal. EOMI. PERRLA.  No nystagmus ENT      Head:   Normocephalic and atraumatic.      Nose: Normal      Mouth/Throat: Normal, moist mucosa.      Neck:   No meningismus. Full ROM. Hematological/Lymphatic/Immunilogical:   No cervical lymphadenopathy. Cardiovascular:   Bradycardia heart rate 50. Symmetric bilateral radial and DP pulses.  No murmurs. Cap refill less than 2 seconds. Respiratory:   Normal respiratory effort without tachypnea/retractions. Breath sounds are clear and equal bilaterally. No wheezes/rales/rhonchi. Gastrointestinal:   Soft and nontender. Non distended. There is no CVA tenderness.  No rebound, rigidity, or guarding. Genitourinary:    deferred Musculoskeletal:   Normal range of motion in all extremities. No joint effusions.  No lower extremity tenderness.  No edema. Neurologic:   Normal speech and language.  No receptive or expressive aphasia currently Motor grossly intact.  No drift Cerebellar function intact Cranial nerves III through XII intact NIH stroke scale 0 No acute focal neurologic deficits are appreciated.  Skin:    Skin is warm, dry and intact. No rash noted.  No petechiae, purpura, or bullae.  ____________________________________________    LABS (pertinent positives/negatives) (all labs ordered are listed, but only abnormal results are displayed) Labs Reviewed  CBC -  Abnormal; Notable for the following components:      Result Value   WBC 12.0 (*)    Hemoglobin 17.9 (*)    All other components within normal limits  DIFFERENTIAL - Abnormal; Notable for the following components:   Neutro Abs 10.2 (*)    All other components within normal limits  COMPREHENSIVE METABOLIC PANEL - Abnormal; Notable for the following components:   Creatinine, Ser 1.25 (*)    Total Bilirubin 1.7 (*)    GFR, Estimated 59 (*)    All other components within normal limits  CBG MONITORING, ED - Abnormal; Notable for the following components:   Glucose-Capillary 105 (*)    All other components within normal limits  PROTIME-INR  APTT  LIPID PANEL  HEMOGLOBIN A1C   ____________________________________________   EKG  Interpreted by me Sinus bradycardia rate of 49.  Normal axis and intervals.  Normal QRS ST segments and T waves.  No ischemic changes.  ____________________________________________    RADIOLOGY  CT HEAD WO CONTRAST  Result Date: 01/03/2021 CLINICAL DATA:  Neuro deficit, acute, stroke suspected. sudden onset word garbage , had 2 episodes lasting 2-3 minutes apart, 1st around 1400 and 2nd around 1415. Speech is clear at present, pt denies any numbness, weakness or other sx EXAM: CT HEAD WITHOUT CONTRAST  TECHNIQUE: Contiguous axial images were obtained from the base of the skull through the vertex without intravenous contrast. COMPARISON:  MR head 05/06/2020 BRAIN: BRAIN Patchy and confluent areas of decreased attenuation are noted throughout the deep and periventricular white matter of the cerebral hemispheres bilaterally, compatible with chronic microvascular ischemic disease. Limited evaluation of the temporal lobe, occipital lobes, cerebellum due to streak artifact. No evidence of large-territorial acute infarction. No parenchymal hemorrhage. No mass lesion. No extra-axial collection. No mass effect or midline shift. No hydrocephalus. Basilar cisterns are patent. Vascular: No hyperdense vessel. Skull: No acute fracture or focal lesion. Sinuses/Orbits: Paranasal sinuses and mastoid air cells are clear. The orbits are unremarkable. Other: None. IMPRESSION: No acute intracranial abnormality in a patient with chronic microvascular ischemic changes. Consider MRI head noncontrast for further evaluation. Electronically Signed   By: Iven Finn M.D.   On: 01/03/2021 15:43    ____________________________________________   PROCEDURES Procedures  ____________________________________________  DIFFERENTIAL DIAGNOSIS   Intracranial hemorrhage, ischemic stroke, electrolyte abnormality, delirium  CLINICAL IMPRESSION / ASSESSMENT AND PLAN / ED COURSE  Medications ordered in the ED: Medications  sodium chloride flush (NS) 0.9 % injection 3 mL (has no administration in time range)    Pertinent labs & imaging results that were available during my care of the patient were reviewed by me and considered in my medical decision making (see chart for details).  Bobby Murillo was evaluated in Emergency Department on 01/03/2021 for the symptoms described in the history of present illness. He was evaluated in the context of the global COVID-19 pandemic, which necessitated consideration that the patient might be  at risk for infection with the SARS-CoV-2 virus that causes COVID-19. Institutional protocols and algorithms that pertain to the evaluation of patients at risk for COVID-19 are in a state of rapid change based on information released by regulatory bodies including the CDC and federal and state organizations. These policies and algorithms were followed during the patient's care in the ED.   Patient presents with a few episodes of speech difficulty most worrisome for stroke/TIA.  Given age, hypertension, hyperlipidemia, recurrent episodes over a short timeframe, patient warrants hospitalization for stroke/TIA work-up.  Will  discuss with neurology.  Initial vital signs are unremarkable, in line with known sinus bradycardia and hypertension.    ----------------------------------------- 3:55 PM on 01/03/2021 ----------------------------------------- D/w Neuro Dr. Curly Shores who will evaluate. Clinical Course as of 01/03/21 1558  Fri Jan 03, 2021  1557 CT head images reviewed by me, no obvious intracranial hemorrhage or dense infarct.  Radiology report reviewed as well, unremarkable. [PS]    Clinical Course User Index [PS] Carrie Mew, MD     ____________________________________________   FINAL CLINICAL IMPRESSION(S) / ED DIAGNOSES    Final diagnoses:  TIA (transient ischemic attack)     ED Discharge Orders     None       Portions of this note were generated with dragon dictation software. Dictation errors may occur despite best attempts at proofreading.    Carrie Mew, MD 01/03/21 1556

## 2021-01-03 NOTE — H&P (Signed)
History and Physical    Bobby Murillo XBM:[id number] DOB: 1940/07/23 DOA: 01/03/2021  Referring MD/NP/PA:   PCP: Bobby Mc, MD   Patient coming from:  The patient is coming from home.  At baseline, pt is independent for most of ADL.        Chief Complaint: slurred speech  HPI: Bobby Murillo is a 80 y.o. male with medical history significant of hypertension, hyperlipidemia, anxiety, bradycardia, prostate cancer (s/p for prostatectomy), vertigo, CKD-3A, polycythemia, who presents with slurred speech.  Per patient's wife, patient had 2 episode of slurred speech, first episode happened at about 2 PM, which lasted for about 2-3 minutes, then resolved spontaneously.  Described as coherent speaking and word garbage.  Second episode happened approximately 15-30 minutes later.  Does not have unilateral numbness or tinglings in extremities.  No facial droop or slurred speech.  No vision change or hearing loss.  Denies chest pain, cough, shortness.  No fever or chills.  No nausea, vomiting, diarrhea or abdominal pain.  No symptoms of UTI.  Patient is taking 325 mg of aspirin daily currently.  ED Course: pt was found to have WBC 12.0, INR 1.0, PTT 31, pending COVID-19 PCR, renal function at baseline, temperature normal, blood pressure 184/75, heart rate 48, RR 18, oxygen saturation 97% on room air.  CT of head is negative for acute intracranial abnormalities.  Bobby Murillo of neurology is consulted  Review of Systems:   General: no fevers, chills, no body weight gain, fatigue HEENT: no blurry vision, hearing changes or sore throat Respiratory: no dyspnea, coughing, wheezing CV: no chest pain, no palpitations GI: no nausea, vomiting, abdominal pain, diarrhea, constipation GU: no dysuria, burning on urination, increased urinary frequency, hematuria  Ext: no leg edema Neuro: no unilateral weakness, numbness, or tingling, no vision change or hearing loss. Has slurred speech Skin: no rash, no skin  tear. MSK: No muscle spasm, no deformity, no limitation of range of movement in spin Heme: No easy bruising.  Travel history: No recent long distant travel.  Allergy:  Allergies  Allergen Reactions   Alfuzosin Other (See Comments)    Other Reaction: fainting spell   Pravastatin Sodium     Other reaction(s): Muscle Pain    Past Medical History:  Diagnosis Date   Abnormal LFTs    Anxiety    Basal cell carcinoma July 2012   left calf    Hyperlipidemia    prostate cancer 2010   Dr. Ronny Bacon   Sinus bradycardia by electrocardiogram     Past Surgical History:  Procedure Laterality Date   PROSTATECTOMY  11/2008   transurethral, radical, Dr. Darcus Austin    Social History:  reports that he has never smoked. He has never used smokeless tobacco. He reports current alcohol use of about 8.0 standard drinks per week. He reports that he does not use drugs.  Family History:  Family History  Problem Relation Age of Onset   Alzheimer's disease Mother    Stroke Father    Prostate cancer Father    Alzheimer's disease Sister    Stroke Paternal Aunt    Heart disease Paternal Uncle 38     Prior to Admission medications   Medication Sig Start Date End Date Taking? Authorizing Provider  acetaminophen (TYLENOL) 325 MG tablet Take 650 mg by mouth every 6 (six) hours as needed.    [provider]  ALPRAZolam (XANAX) 0.5 MG tablet TAKE 1 TABLET BY MOUTH AT BEDTIME AS NEEDED FOR SLEEP  08/08/20   Bobby Mc, MD  Ascorbic Acid (VITAMIN C PO) Take by mouth.    [provider]  Aspirin Buf,AlHyd-MgHyd-CaCar, (ASCRIPTIN) 325 MG TABS Take 1 tablet by mouth daily. 04/25/20   Bobby Mc, MD  atorvastatin (LIPITOR) 20 MG tablet Take 1 tablet (20 mg total) by mouth 2 (two) times a week. 05/09/20   Bobby Mc, MD  Cyanocobalamin (B-12 PO) Take by mouth.    [provider]  ezetimibe (ZETIA) 10 MG tablet Take 1 tablet by mouth once daily 01/03/21   Bobby Mc, MD   fluticasone (FLONASE) 50 MCG/ACT nasal spray Place 2 sprays into both nostrils daily. 11/08/20   Bobby Mc, MD  Glucosamine-Chondroitin (GLUCOSAMINE CHONDR COMPLEX PO) Take 1 tablet by mouth daily. tab by mouth daily 11/20/08   [provider]  losartan (COZAAR) 50 MG tablet Take 1 tablet by mouth once daily 09/09/20   Bobby Mc, MD  polycarbophil (FIBERCON) 625 MG tablet Take 625 mg by mouth daily. Reported on 05/29/2015    [provider]  TURMERIC PO Take by mouth.    [provider]    Physical Exam: Vitals:   01/03/21 1445 01/03/21 1448 01/03/21 1600  BP:  (!) 184/75 (!) 144/76  Pulse:  (!) 48 (!) 46  Resp:  18   Temp:  97.6 F (36.4 C)   TempSrc:  Oral   SpO2:  97% 97%  Weight: 90.7 kg    Height: 6\' 1"  (1.854 m)     General: Not in acute distress HEENT:       Eyes: PERRL, EOMI, no scleral icterus.       ENT: No discharge from the ears and nose, no pharynx injection, no tonsillar enlargement.        Neck: No JVD, no bruit, no mass felt. Heme: No neck lymph node enlargement. Cardiac: S1/S2, RRR, No murmurs, No gallops or rubs. Respiratory: No rales, wheezing, rhonchi or rubs. GI: Soft, nondistended, nontender, no rebound pain, no organomegaly, BS present. GU: No hematuria Ext: No pitting leg edema bilaterally. 1+DP/PT pulse bilaterally. Musculoskeletal: No joint deformities, No joint redness or warmth, no limitation of ROM in spin. Skin: No rashes.  Neuro: Alert, oriented X3, cranial nerves II-XII grossly intact, moves all extremities normally. Muscle strength 5/5 in all extremities, sensation to light touch intact.  Psych: Patient is not psychotic, no suicidal or hemocidal ideation.  Labs on Admission: I have personally reviewed following labs and imaging studies  CBC: Recent Labs  Lab 01/03/21 1500  WBC 12.0*  NEUTROABS 10.2*  HGB 17.9*  HCT 50.2  MCV 91.3  PLT 242   Basic Metabolic Panel: Recent Labs  Lab 01/03/21 1500   NA 138  K 4.1  CL 103  CO2 26  GLUCOSE 94  BUN 18  CREATININE 1.25*  CALCIUM 9.6   GFR: Estimated Creatinine Clearance: 55 mL/min (A) (by C-G formula based on SCr of 1.25 mg/dL (H)). Liver Function Tests: Recent Labs  Lab 01/03/21 1500  AST 34  ALT 33  ALKPHOS 87  BILITOT 1.7*  PROT 7.2  ALBUMIN 4.0   No results for input(s): LIPASE, AMYLASE in the last 168 hours. No results for input(s): AMMONIA in the last 168 hours. Coagulation Profile: Recent Labs  Lab 01/03/21 1500  INR 1.0   Cardiac Enzymes: No results for input(s): CKTOTAL, CKMB, CKMBINDEX, TROPONINI in the last 168 hours. BNP (last 3 results) No results for input(s): PROBNP in the  last 8760 hours. HbA1C: No results for input(s): HGBA1C in the last 72 hours. CBG: Recent Labs  Lab 01/03/21 1458  GLUCAP 105*   Lipid Profile: Recent Labs    01/03/21 1500  CHOL 186  HDL 64  LDLCALC 103*  TRIG 93  CHOLHDL 2.9   Thyroid Function Tests: No results for input(s): TSH, T4TOTAL, FREET4, T3FREE, THYROIDAB in the last 72 hours. Anemia Panel: No results for input(s): VITAMINB12, FOLATE, FERRITIN, TIBC, IRON, RETICCTPCT in the last 72 hours. Urine analysis:    Component Value Date/Time   COLORURINE YELLOW 09/09/2018 1343   APPEARANCEUR CLEAR 09/09/2018 1343   LABSPEC 1.025 09/09/2018 1343   PHURINE 5.5 09/09/2018 1343   GLUCOSEU NEGATIVE 09/09/2018 1343   HGBUR TRACE-INTACT (A) 09/09/2018 1343   BILIRUBINUR NEGATIVE 09/09/2018 1343   KETONESUR NEGATIVE 09/09/2018 1343   UROBILINOGEN 0.2 09/09/2018 1343   NITRITE NEGATIVE 09/09/2018 1343   LEUKOCYTESUR NEGATIVE 09/09/2018 1343   Sepsis Labs: @LABRCNTIP (procalcitonin:4,lacticidven:4) )No results found for this or any previous visit (from the past 240 hour(s)).   Radiological Exams on Admission: CT HEAD WO CONTRAST  Result Date: 01/03/2021 CLINICAL DATA:  Neuro deficit, acute, stroke suspected. sudden onset word garbage , had 2 episodes lasting  2-3 minutes apart, 1st around 1400 and 2nd around 1415. Speech is clear at present, pt denies any numbness, weakness or other sx EXAM: CT HEAD WITHOUT CONTRAST TECHNIQUE: Contiguous axial images were obtained from the base of the skull through the vertex without intravenous contrast. COMPARISON:  MR head 05/06/2020 BRAIN: BRAIN Patchy and confluent areas of decreased attenuation are noted throughout the deep and periventricular white matter of the cerebral hemispheres bilaterally, compatible with chronic microvascular ischemic disease. Limited evaluation of the temporal lobe, occipital lobes, cerebellum due to streak artifact. No evidence of large-territorial acute infarction. No parenchymal hemorrhage. No mass lesion. No extra-axial collection. No mass effect or midline shift. No hydrocephalus. Basilar cisterns are patent. Vascular: No hyperdense vessel. Skull: No acute fracture or focal lesion. Sinuses/Orbits: Paranasal sinuses and mastoid air cells are clear. The orbits are unremarkable. Other: None. IMPRESSION: No acute intracranial abnormality in a patient with chronic microvascular ischemic changes. Consider MRI head noncontrast for further evaluation. Electronically Signed   By: Iven Finn M.D.   On: 01/03/2021 15:43     EKG: I have personally reviewed.  Sinus rhythm, QTC 408, no ischemic change  Assessment/Plan Principal Problem:   TIA (transient ischemic attack) Active Problems:   Essential hypertension   Polycythemia   Stage 3a chronic kidney disease (HCC)   Vertigo   Hyperlipidemia   Anxiety   Leukocytosis   TIA (transient ischemic attack): Patient has had 2 episode of slurred speech, which has resolved now.  No focal neuro logical deficit on physical examination currently.  Likely due to TIA.  Bobby Murillo of neurology is consulted, recommended TIA work-up and start patient on Plavix in addition to aspirin..  -Placed on MedSurg bed for observation - Obtain MRI/MRA  - will hold  oral Bp meds to allow permissive HTN in the setting of acute stroke, for SBP>220 or dBP>120 - Check carotid dopplers  - per Bobby Murillo, will start pt on 300mg  of plavix now, then 75 mg daily and change ASA dose from home 325 to 81 mg daily - fasting lipid panel and HbA1c  - 2D transthoracic echocardiography  - swallowing screen. If fails, will get SLP - PT/OT consult  Essential hypertension -Hold home Bp meds -IV hydralazine for SBP>220 or dBP>120  Polycythemia: Hgb 17.9. -will be on ASA and plavix -F/u by CBC  Stage 3a chronic kidney disease ([place]): stable -f/u by BMP  Hx of Vertigo -prn meclizine  Hyperlipidemia -Zetia - Lipitor (pt is taking it twice per week)  Anxiety -Continue home as needed Xanax   Leukocytosis: WBC 12.0, no source of infection identified.  No fever.  Likely reactive -Follow-up with CBC     DVT ppx: SQ Lovenox Code Status: Full code Family Communication:   Yes, patient's wife  at bed side Disposition Plan:  Anticipate discharge back to previous environment Consults called:  Bobby Murillo of neuro Admission status and Level of care: Med-Surg:     for obs   Status is: Observation  The patient remains OBS appropriate and will d/c before 2 midnights.        Date of Service 01/03/2021    Ivor Costa [facility]   If 7PM-7AM, please contact night-coverage [email or web address] 01/03/2021, 4:54 PM

## 2021-01-04 ENCOUNTER — Encounter: Payer: Self-pay | Admitting: Internal Medicine

## 2021-01-04 ENCOUNTER — Observation Stay
Admit: 2021-01-04 | Discharge: 2021-01-04 | Disposition: A | Discharge status: Home / Self Care | Payer: Medicare Other | Attending: Internal Medicine | Admitting: Internal Medicine

## 2021-01-04 DIAGNOSIS — D751 Secondary polycythemia: Secondary | ICD-10-CM | POA: Diagnosis not present

## 2021-01-04 DIAGNOSIS — R42 Dizziness and giddiness: Secondary | ICD-10-CM | POA: Diagnosis not present

## 2021-01-04 DIAGNOSIS — E785 Hyperlipidemia, unspecified: Secondary | ICD-10-CM | POA: Diagnosis not present

## 2021-01-04 DIAGNOSIS — N1831 Chronic kidney disease, stage 3a: Secondary | ICD-10-CM | POA: Diagnosis not present

## 2021-01-04 DIAGNOSIS — D72829 Elevated white blood cell count, unspecified: Secondary | ICD-10-CM | POA: Diagnosis not present

## 2021-01-04 DIAGNOSIS — I6522 Occlusion and stenosis of left carotid artery: Secondary | ICD-10-CM

## 2021-01-04 DIAGNOSIS — G459 Transient cerebral ischemic attack, unspecified: Secondary | ICD-10-CM | POA: Diagnosis not present

## 2021-01-04 DIAGNOSIS — I63232 Cerebral infarction due to unspecified occlusion or stenosis of left carotid arteries: Secondary | ICD-10-CM

## 2021-01-04 DIAGNOSIS — I63512 Cerebral infarction due to unspecified occlusion or stenosis of left middle cerebral artery: Secondary | ICD-10-CM | POA: Diagnosis not present

## 2021-01-04 DIAGNOSIS — Q211 Atrial septal defect, unspecified: Secondary | ICD-10-CM

## 2021-01-04 DIAGNOSIS — I10 Essential (primary) hypertension: Secondary | ICD-10-CM | POA: Diagnosis not present

## 2021-01-04 DIAGNOSIS — R001 Bradycardia, unspecified: Secondary | ICD-10-CM | POA: Diagnosis not present

## 2021-01-04 LAB — CBC
HCT: 47.9 % (ref 39.0–52.0)
Hemoglobin: 16.8 g/dL (ref 13.0–17.0)
MCH: 31.6 pg (ref 26.0–34.0)
MCHC: 35.1 g/dL (ref 30.0–36.0)
MCV: 90 fL (ref 80.0–100.0)
Platelets: 202 10*3/uL (ref 150–400)
RBC: 5.32 MIL/uL (ref 4.22–5.81)
RDW: 12.7 % (ref 11.5–15.5)
WBC: 5.5 10*3/uL (ref 4.0–10.5)
nRBC: 0 % (ref 0.0–0.2)

## 2021-01-04 LAB — ECHOCARDIOGRAM COMPLETE
AR max vel: 2.76 cm2
AV Area VTI: 2.99 cm2
AV Area mean vel: 2.84 cm2
AV Mean grad: 3.5 mmHg
AV Peak grad: 7.3 mmHg
Ao pk vel: 1.36 m/s
Calc EF: 59.2 %
Height: 73 in
S' Lateral: 3.2 cm
Single Plane A2C EF: 45.2 %
Single Plane A4C EF: 66 %
Weight: 3200 oz

## 2021-01-04 LAB — HEMOGLOBIN A1C
Hgb A1c MFr Bld: 6 % — ABNORMAL HIGH (ref 4.8–5.6)
Mean Plasma Glucose: 126 mg/dL

## 2021-01-04 MED ORDER — SODIUM CHLORIDE 0.9 % IV SOLN: INTRAVENOUS | Status: AC

## 2021-01-04 MED ORDER — ATORVASTATIN CALCIUM 20 MG PO TABS: 20.0000 mg | ORAL_TABLET | ORAL | Status: DC

## 2021-01-04 MED ORDER — ALPRAZOLAM 0.5 MG PO TABS
0.5000 mg | ORAL_TABLET | Freq: Every evening | ORAL | Status: DC | PRN
  Administered 2021-01-04: 21:00:00 0.5 mg via ORAL
  Filled 2021-01-04: qty 1

## 2021-01-04 MED ORDER — EZETIMIBE 10 MG PO TABS
10.0000 mg | ORAL_TABLET | Freq: Every day | ORAL | Status: DC
  Administered 2021-01-04 – 2021-01-05 (×2): 10 mg via ORAL
  Filled 2021-01-04 (×3): qty 1

## 2021-01-04 MED ORDER — TURMERIC 500 MG PO CAPS: 1.0000 | ORAL_CAPSULE | Freq: Every day | ORAL | Status: DC

## 2021-01-04 MED ORDER — CALCIUM POLYCARBOPHIL 625 MG PO TABS
625.0000 mg | ORAL_TABLET | Freq: Every day | ORAL | Status: DC
  Administered 2021-01-04 – 2021-01-05 (×2): 625 mg via ORAL
  Filled 2021-01-04 (×2): qty 1

## 2021-01-04 MED ORDER — HYDRALAZINE HCL 20 MG/ML IJ SOLN: 5.0000 mg | INTRAMUSCULAR | Status: DC | PRN

## 2021-01-04 MED ORDER — ONDANSETRON HCL 4 MG/2ML IJ SOLN: 4.0000 mg | Freq: Three times a day (TID) | INTRAMUSCULAR | Status: DC | PRN

## 2021-01-04 NOTE — Evaluation (Signed)
Physical Therapy Evaluation Patient Details Name: Bobby Murillo [name]: [id number] DOB: 1940/09/20 Today's Date: 01/04/2021  History of Present Illness  80 yo male with onset of slurring of speech was admitted 10/21, resolved. 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 No hemorrhage or mass effect.  PMHx:  HTN, polycythemia, CKD 3a, vertigo, HLD, anxiety, leukocytosis, skin CA, bradycardia, prostate CA, abnormal LFT's  Clinical Impression  Pt was seen for assessment of acute changes to a chronic balance issue in the setting of acute infarcts on L posterior frontal lobe.  Pt is quite motivated, and has benefit of being physically active prior to this occurrence.  He is appropriate for PT to see in outpatient to work on high level balance skills, esp given that pt is on uneven terrain to golf and running with tennis.   Focus acutely on balance challenges, with implementation of surface changes, head movement and dual task issues to increase difficulty for him.  Pt is understanding of the purpose of PT intervention, and will be a good candidate for this next venue given his ability to carry over to a HEP.  Focus on goals of acute PT and will be observant of possible vestibular issues as well, although pt did not demonstrate obvious concerns at this time.       Recommendations for follow up therapy are one component of a multi-disciplinary discharge planning process, led by the attending physician.  Recommendations may be updated based on patient status, additional functional criteria and insurance authorization.  Follow Up Recommendations Outpatient PT;Supervision for mobility/OOB    Equipment Recommendations  None recommended by PT    Recommendations for Other Services       Precautions / Restrictions Precautions Precautions: Fall Restrictions Weight Bearing Restrictions: No      Mobility  Bed Mobility Overal bed  mobility: Independent                  Transfers Overall transfer level: Modified independent               General transfer comment: pt is able to stand up independently but impulsive with effort  Ambulation/Gait Ambulation/Gait assistance: Supervision Gait Distance (Feet): 300 Feet Assistive device: None Gait Pattern/deviations: Step-through pattern Gait velocity: reduced Gait velocity interpretation: <1.31 ft/sec, indicative of household ambulator General Gait Details: steps with RLE catching him at times, crossover to L side on RLE, lateral shift to L  Stairs            Wheelchair Mobility    Modified Rankin (Stroke Patients Only) Modified Rankin (Stroke Patients Only) Pre-Morbid Rankin Score: No significant disability Modified Rankin: Moderate disability     Balance Overall balance assessment: Needs assistance Sitting-balance support: Feet supported Sitting balance-Leahy Scale: Normal     Standing balance support: During functional activity;No upper extremity supported Standing balance-Leahy Scale: Good Standing balance comment: fair to less than fair with RLE catching him as needed                             Pertinent Vitals/Pain Pain Assessment: No/denies pain    Home Living Family/patient expects to be discharged to:: Private residence Living Arrangements: Spouse/significant other Available Help at Discharge: Family;Available PRN/intermittently Type of Home: House Home Access: Level entry     Home Layout: Two level;Able to live on main level with bedroom/bathroom Home Equipment: Gilford Rile -  2 wheels;Cane - single point;Bedside commode (uses BSC for shower chair) Additional Comments: wife had TKA    Prior Function Level of Independence: Independent         Comments: very active, plays golf and tennis     Hand Dominance   Dominant Hand: Right    Extremity/Trunk Assessment   Upper Extremity Assessment Upper  Extremity Assessment: Defer to OT evaluation    Lower Extremity Assessment Lower Extremity Assessment: LLE deficits/detail LLE Deficits / Details: balance control is lost on LLE in SLS or tandem LLE Coordination: WNL    Cervical / Trunk Assessment Cervical / Trunk Assessment: Normal  Communication   Communication: No difficulties  Cognition Arousal/Alertness: Awake/alert Behavior During Therapy: WFL for tasks assessed/performed Overall Cognitive Status: Within Functional Limits for tasks assessed                                        General Comments General comments (skin integrity, edema, etc.): pt has no nystagmus or dizziness complaints, but demonstrates LLE challenge to support in single leg stance and when LLE is supporting in tandem.  Can manage narrow base and with eyes closed.    Exercises Other Exercises Other Exercises: OT ed with pt and spouse re: role   Assessment/Plan    PT Assessment Patient needs continued PT services  PT Problem List Decreased balance;Decreased safety awareness       PT Treatment Interventions DME instruction;Gait training;Functional mobility training;Therapeutic activities;Therapeutic exercise;Balance training;Neuromuscular re-education;Patient/family education    PT Goals (Current goals can be found in the Care Plan section)  Acute Rehab PT Goals Patient Stated Goal: to go home PT Goal Formulation: With patient/family Time For Goal Achievement: 01/18/21 Potential to Achieve Goals: Good    Frequency 7X/week   Barriers to discharge   no barriers but will benefit from outpatient therapy to practice balance skills, assess vestibular hypofunction    Co-evaluation   Reason for Co-Treatment: To address functional/ADL transfers PT goals addressed during session: Mobility/safety with mobility OT goals addressed during session: ADL's and self-care       AM-PAC PT "6 Clicks" Mobility  Outcome Measure Help needed  turning from your back to your side while in a flat bed without using bedrails?: None Help needed moving from lying on your back to sitting on the side of a flat bed without using bedrails?: None Help needed moving to and from a bed to a chair (including a wheelchair)?: None Help needed standing up from a chair using your arms (e.g., wheelchair or bedside chair)?: A Little Help needed to walk in hospital room?: A Little Help needed climbing 3-5 steps with a railing? : A Little 6 Click Score: 21    End of Session   Activity Tolerance: Patient tolerated treatment well;Treatment limited secondary to medical complications (Comment) Patient left: in bed;with call bell/phone within reach;with bed alarm set;with family/visitor present;with nursing/sitter in room Nurse Communication: Mobility status;Other (comment) (staff communication with his outcome of eval) PT Visit Diagnosis: Unsteadiness on feet (R26.81);Muscle weakness (generalized) (M62.81);Ataxic gait (R26.0);Hemiplegia and hemiparesis Hemiplegia - Right/Left: Left Hemiplegia - dominant/non-dominant: Non-dominant Hemiplegia - caused by: Cerebral infarction    Time: 7939-0300 PT Time Calculation (min) (ACUTE ONLY): 24 min   Charges:   PT Evaluation $PT Eval Moderate Complexity: 1 Mod         Ramond Dial 01/04/2021, 10:54 AM  Mee Hives, PT PhD  Acute Rehab Dept. Number: [place] and [place]

## 2021-01-04 NOTE — Assessment & Plan Note (Signed)
#  80 year old male patient is currently admitted hospital for dysarthria left-sided stroke noted to have elevated hemoglobin/hematocrit.  #Erythrocytosis-mild etiology unclear-dehydration versus others.  Patient non-smoker.  Rule out primary bone marrow causes.  #Left-sided stroke-with critical stenosis on the left carotid  Recommendation:  #Check JAK2 mutation.  Again low clinical suspicion for polycythemia vera leading to stroke especially if patient has significant carotid stenosis on the left side.  From hematology standpoint would recommend holding off phlebotomy at this time.  Again given critical left-sided carotid stenosis agree with vascular evaluation.  Thank you Dr. Cyndia Skeeters for allowing me to participate in the care of your pleasant patient. Please do not hesitate to contact me with questions or concerns in the interim.

## 2021-01-04 NOTE — Progress Notes (Signed)
[facility] CONSULT NOTE  Patient Care Team: Crecencio Mc, MD as PCP - General (Internal Medicine)  CHIEF COMPLAINTS/PURPOSE OF CONSULTATION:  Polycythemia  HISTORY OF PRESENTING ILLNESS:  Bobby Murillo 80 y.o.  male with no history of smoking/or lung disease is currently admitted to hospital for dysarthria.  MRI showed left-sided stroke.  Further work-up including carotid Doppler shows-critical stenosis in the left side.  Of note patient was also noted to have elevated hemoglobin/hematocrit on admission-hemoglobin 17.2/hematocrit 51.5.  However in February 2022 patient's hematocrit was 17.9 with a hematocrit of 52.7.  Hematology has been consulted for further evaluation-for work-up of polycythemia in the context of stroke.  Patient overall feels improved.  His neurologic deficits have not resolved but improving.  Review of Systems  Constitutional:  Negative for chills, diaphoresis, fever, malaise/fatigue and weight loss.  HENT:  Negative for nosebleeds and sore throat.   Eyes:  Negative for double vision.  Respiratory:  Negative for cough, hemoptysis, sputum production, shortness of breath and wheezing.   Cardiovascular:  Negative for chest pain, palpitations, orthopnea and leg swelling.  Gastrointestinal:  Negative for abdominal pain, blood in stool, constipation, diarrhea, heartburn, melena, nausea and vomiting.  Genitourinary:  Negative for dysuria, frequency and urgency.  Musculoskeletal:  Negative for back pain and joint pain.  Skin: Negative.  Negative for itching and rash.  Neurological:  Positive for speech change and weakness. Negative for dizziness, tingling, focal weakness and headaches.  Endo/Heme/Allergies:  Does not bruise/bleed easily.  Psychiatric/Behavioral:  Negative for depression. The patient is not nervous/anxious and does not have insomnia.     MEDICAL HISTORY:  Past Medical History:  Diagnosis Date   Abnormal LFTs    Anxiety    Basal cell  carcinoma July 2012   left calf    Hyperlipidemia    prostate cancer 2010   Dr. Ronny Bacon   Sinus bradycardia by electrocardiogram     SURGICAL HISTORY: Past Surgical History:  Procedure Laterality Date   PROSTATECTOMY  11/2008   transurethral, radical, Dr. Darcus Austin    SOCIAL HISTORY: Social History   Socioeconomic History   Marital status: Married    Spouse name: Not on file   Number of children: Not on file   Years of education: Not on file   Highest education level: Not on file  Occupational History   Not on file  Tobacco Use   Smoking status: Never   Smokeless tobacco: Never  Vaping Use   Vaping Use: Never used  Substance and Sexual Activity   Alcohol use: Yes    Alcohol/week: 8.0 standard drinks    Types: 4 Glasses of wine, 4 Shots of liquor per week    Comment: occasional   Drug use: No   Sexual activity: Yes  Other Topics Concern   Not on file  Social History Narrative   Patient lives in [place] with his wife.  Non-smoker.  Drinks over the weekend.  Retired from [employer].   Social Determinants of Health   Financial Resource Strain: Low Risk    Difficulty of Paying Living Expenses: Not hard at all  Food Insecurity: No Food Insecurity   Worried About Charity fundraiser in the Last Year: Never true   [place] in the Last Year: Never true  Transportation Needs: No Transportation Needs   Lack of Transportation (Medical): No   Lack of Transportation (Non-Medical): No  Physical Activity: Not on file  Stress: No Stress Concern Present  Feeling of Stress : Not at all  Social Connections: Unknown   Frequency of Communication with Friends and Family: More than three times a week   Frequency of Social Gatherings with Friends and Family: Once a week   Attends Religious Services: Not on Electrical engineer or Organizations: Yes   Attends Archivist Meetings: Not on file   Marital Status: Married  Human resources officer Violence: Not At Risk   Fear of  Current or Ex-Partner: No   Emotionally Abused: No   Physically Abused: No   Sexually Abused: No    FAMILY HISTORY: Family History  Problem Relation Age of Onset   Alzheimer's disease Mother    Stroke Father    Prostate cancer Father    Alzheimer's disease Sister    Stroke Paternal Aunt    Heart disease Paternal Uncle 67    ALLERGIES:  is allergic to alfuzosin and pravastatin sodium.  MEDICATIONS:  Current Facility-Administered Medications  Medication Dose Route Frequency Provider Last Rate Last Admin   0.9 %  sodium chloride infusion   Intravenous Continuous Mercy Riding, MD 75 mL/hr at 01/04/21 0834 New Bag at 01/04/21 0834   acetaminophen (TYLENOL) tablet 650 mg  650 mg Oral Q4H PRN Ivor Costa, MD       Or   acetaminophen (TYLENOL) 160 MG/5ML solution 650 mg  650 mg Per Tube Q4H PRN Ivor Costa, MD       Or   acetaminophen (TYLENOL) suppository 650 mg  650 mg Rectal Q4H PRN Ivor Costa, MD       ALPRAZolam Duanne Moron) tablet 0.5 mg  0.5 mg Oral QHS PRN Ivor Costa, MD       aspirin EC tablet 81 mg  81 mg Oral Daily Ivor Costa, MD   81 mg at 01/04/21 0830   [START ON 01/06/2021] atorvastatin (LIPITOR) tablet 20 mg  20 mg Oral Once per day on Mon Thu Niu, Xilin, MD       clopidogrel (PLAVIX) tablet 75 mg  75 mg Oral Daily Ivor Costa, MD   75 mg at 01/04/21 0830   enoxaparin (LOVENOX) injection 40 mg  40 mg Subcutaneous Q24H Ivor Costa, MD   40 mg at 01/03/21 2237   ezetimibe (ZETIA) tablet 10 mg  10 mg Oral Daily Ivor Costa, MD   10 mg at 01/04/21 0831   hydrALAZINE (APRESOLINE) injection 5 mg  5 mg Intravenous Q2H PRN Ivor Costa, MD       meclizine (ANTIVERT) tablet 25 mg  25 mg Oral BID PRN Ivor Costa, MD       ondansetron [facility]) injection 4 mg  4 mg Intravenous Q8H PRN Ivor Costa, MD       polycarbophil (FIBERCON) tablet 625 mg  625 mg Oral Daily Ivor Costa, MD   625 mg at 01/04/21 0831   senna-docusate (Senokot-S) tablet 1 tablet  1 tablet Oral QHS PRN Ivor Costa, MD           .  PHYSICAL EXAMINATION:  Vitals:   01/04/21 1200 01/04/21 1432  BP: (!) 146/80 138/80  Pulse: 71 77  Resp: 18 18  Temp: 98.5 F (36.9 C) 97.9 F (36.6 C)  SpO2: 98% 98%   Filed Weights   01/03/21 1445  Weight: 200 lb (90.7 kg)    Physical Exam Vitals and nursing note reviewed.  HENT:     Head: Normocephalic and atraumatic.     Mouth/Throat:     Pharynx:  Oropharynx is clear.  Eyes:     Extraocular Movements: Extraocular movements intact.     Pupils: Pupils are equal, round, and reactive to light.  Cardiovascular:     Rate and Rhythm: Normal rate and regular rhythm.  Pulmonary:     Comments: Decreased breath sounds bilaterally.  Abdominal:     Palpations: Abdomen is soft.  Musculoskeletal:        General: Normal range of motion.     Cervical back: Normal range of motion.  Skin:    General: Skin is warm.  Neurological:     General: No focal deficit present.     Mental Status: He is alert and oriented to person, place, and time.  Psychiatric:        Behavior: Behavior normal.        Judgment: Judgment normal.     LABORATORY DATA:  I have reviewed the data as listed Lab Results  Component Value Date   WBC 5.5 01/04/2021   HGB 16.8 01/04/2021   HCT 47.9 01/04/2021   MCV 90.0 01/04/2021   PLT 202 01/04/2021   Recent Labs    05/06/20 0914 05/27/20 1527 12/23/20 1158 01/03/21 1500  NA 140 139 141 138  K 5.0 4.3 4.4 4.1  CL 106 105 105 103  CO2 26 25 27 26   GLUCOSE 101* 114* 82 94  BUN 16 22 16 18   CREATININE 1.38 1.32 1.24 1.25*  CALCIUM 9.7 9.3 9.7 9.6  GFRNONAA  --   --   --  59*  PROT 6.3  --  6.4 7.2  ALBUMIN 3.9  --  4.1 4.0  AST 23  --  25 34  ALT 29  --  31 33  ALKPHOS 91  --  93 87  BILITOT 1.1  --  2.0* 1.7*    RADIOGRAPHIC STUDIES: I have personally reviewed the radiological images as listed and agreed with the findings in the report. CT HEAD WO CONTRAST  Result Date: 01/03/2021 CLINICAL DATA:  Neuro deficit, acute, stroke  suspected. sudden onset word garbage , had 2 episodes lasting 2-3 minutes apart, 1st around 1400 and 2nd around 1415. Speech is clear at present, pt denies any numbness, weakness or other sx EXAM: CT HEAD WITHOUT CONTRAST TECHNIQUE: Contiguous axial images were obtained from the base of the skull through the vertex without intravenous contrast. COMPARISON:  MR head 05/06/2020 BRAIN: BRAIN Patchy and confluent areas of decreased attenuation are noted throughout the deep and periventricular white matter of the cerebral hemispheres bilaterally, compatible with chronic microvascular ischemic disease. Limited evaluation of the temporal lobe, occipital lobes, cerebellum due to streak artifact. No evidence of large-territorial acute infarction. No parenchymal hemorrhage. No mass lesion. No extra-axial collection. No mass effect or midline shift. No hydrocephalus. Basilar cisterns are patent. Vascular: No hyperdense vessel. Skull: No acute fracture or focal lesion. Sinuses/Orbits: Paranasal sinuses and mastoid air cells are clear. The orbits are unremarkable. Other: None. IMPRESSION: No acute intracranial abnormality in a patient with chronic microvascular ischemic changes. Consider MRI head noncontrast for further evaluation. Electronically Signed   By: Iven Finn M.D.   On: 01/03/2021 15:43   MR ANGIO HEAD WO CONTRAST  Result Date: 01/03/2021 CLINICAL DATA:  Transient ischemic attack. Neuro deficit, acute, stroke suspected. Speech disturbance. EXAM: MRI HEAD WITHOUT CONTRAST MRA HEAD WITHOUT CONTRAST TECHNIQUE: Multiplanar, multi-echo pulse sequences of the brain and surrounding structures were acquired without intravenous contrast. Angiographic images of the Circle of Willis were acquired using MRA  technique without intravenous contrast. COMPARISON:  Head CT same day FINDINGS: MRI HEAD FINDINGS Brain: Diffusion imaging shows a few punctate foci of acute cortical infarction in the left posterior frontal vertex  region. These are consistent with micro embolic infarctions in the left MCA territory. Possible minimal punctate white matter infarction posterior to the atrium of the left lateral ventricle, possibly incidental. The brainstem and cerebellum appear normal. Cerebral hemispheres otherwise show chronic small-vessel ischemic changes of the white matter. No mass, hemorrhage, hydrocephalus or extra-axial collection. Vascular: Major vessels at the base of the brain show flow. Skull and upper cervical spine: Negative Sinuses/Orbits: Clear/normal Other: None MRA HEAD FINDINGS Anterior circulation: Both internal carotid arteries are widely patent through the skull base and siphon regions. The anterior and middle cerebral vessels are patent without evidence of large vessel occlusion. There is atherosclerotic narrowing and irregularity of the more distal branch vessels. Focal occlusion is present within the right M2 inferior division. Posterior circulation: Both vertebral arteries are patent to the basilar. No basilar stenosis. There is considerable atherosclerotic disease affecting the PCA branches, more extensive on the left than the right. Severe stenosis present on both sides. Anatomic variants: None significant IMPRESSION: Few small foci of acute infarction affecting the cortex to the brain in the left posterior frontal vertex. No hemorrhage or mass effect. Extensive chronic small-vessel ischemic changes elsewhere throughout the cerebral hemispheric white matter. Question incidental punctate acute white matter infarction just posterior to the atrium of the left lateral ventricle. No intracranial large vessel occlusion. Atherosclerotic narrowing and irregularity of the more distal MCA branches. Focal stenosis of the right M2 branch inferior division. Severe atherosclerotic disease of both PCAs with severe stenoses. Electronically Signed   By: Nelson Chimes M.D.   On: 01/03/2021 18:01   MR BRAIN WO CONTRAST  Result Date:  01/03/2021 CLINICAL DATA:  Transient ischemic attack. Neuro deficit, acute, stroke suspected. Speech disturbance. EXAM: MRI HEAD WITHOUT CONTRAST MRA HEAD WITHOUT CONTRAST TECHNIQUE: Multiplanar, multi-echo pulse sequences of the brain and surrounding structures were acquired without intravenous contrast. Angiographic images of the Circle of Willis were acquired using MRA technique without intravenous contrast. COMPARISON:  Head CT same day FINDINGS: MRI HEAD FINDINGS Brain: Diffusion imaging shows a few punctate foci of acute cortical infarction in the left posterior frontal vertex region. These are consistent with micro embolic infarctions in the left MCA territory. Possible minimal punctate white matter infarction posterior to the atrium of the left lateral ventricle, possibly incidental. The brainstem and cerebellum appear normal. Cerebral hemispheres otherwise show chronic small-vessel ischemic changes of the white matter. No mass, hemorrhage, hydrocephalus or extra-axial collection. Vascular: Major vessels at the base of the brain show flow. Skull and upper cervical spine: Negative Sinuses/Orbits: Clear/normal Other: None MRA HEAD FINDINGS Anterior circulation: Both internal carotid arteries are widely patent through the skull base and siphon regions. The anterior and middle cerebral vessels are patent without evidence of large vessel occlusion. There is atherosclerotic narrowing and irregularity of the more distal branch vessels. Focal occlusion is present within the right M2 inferior division. Posterior circulation: Both vertebral arteries are patent to the basilar. No basilar stenosis. There is considerable atherosclerotic disease affecting the PCA branches, more extensive on the left than the right. Severe stenosis present on both sides. Anatomic variants: None significant IMPRESSION: Few small foci of acute infarction affecting the cortex to the brain in the left posterior frontal vertex. No hemorrhage  or mass effect. Extensive chronic small-vessel ischemic changes elsewhere throughout the  cerebral hemispheric white matter. Question incidental punctate acute white matter infarction just posterior to the atrium of the left lateral ventricle. No intracranial large vessel occlusion. Atherosclerotic narrowing and irregularity of the more distal MCA branches. Focal stenosis of the right M2 branch inferior division. Severe atherosclerotic disease of both PCAs with severe stenoses. Electronically Signed   By: Nelson Chimes M.D.   On: 01/03/2021 18:01   US Carotid Bilateral (at [facility] and [facility] only)  Result Date: 01/03/2021 CLINICAL DATA:  TIA symptoms, garbled speech, hyperlipidemia EXAM: BILATERAL CAROTID DUPLEX ULTRASOUND TECHNIQUE: Pearline Cables scale imaging, color Doppler and duplex ultrasound were performed of bilateral carotid and vertebral arteries in the neck. COMPARISON:  None. FINDINGS: Criteria: Quantification of carotid stenosis is based on velocity parameters that correlate the residual internal carotid diameter with NASCET-based stenosis levels, using the diameter of the distal internal carotid lumen as the denominator for stenosis measurement. The following velocity measurements were obtained: RIGHT ICA: 85/26 cm/sec CCA: 403/47 cm/sec SYSTOLIC ICA/CCA RATIO:  0.8 ECA: 104 cm/sec LEFT ICA: 273/101 cm/sec CCA: 42/59 cm/sec SYSTOLIC ICA/CCA RATIO:  4.1 ECA: 145 cm/sec RIGHT CAROTID ARTERY: No significant atheromatous plaque. RIGHT VERTEBRAL ARTERY:  Normal antegrade flow. LEFT CAROTID ARTERY: There is significant nonshadowing likely noncalcified plaque at the origin of the left ICA. Significant spectral broadening within the left ICA waveform. LEFT VERTEBRAL ARTERY:  Normal antegrade flow. IMPRESSION: 1. Estimated 70-99% stenosis within the left ICA, based on grayscale imaging and Doppler interrogation. Significant plaque within the proximal left ICA on grayscale imaging without evidence of calcification or  posterior shadowing. Follow-up CTA may be useful for further evaluation. 2. Estimated 0-49% stenosis within the right ICA. 3. Antegrade flow within the bilateral vertebral arteries. Electronically Signed   By: Randa Ngo M.D.   On: 01/03/2021 20:56   ECHOCARDIOGRAM COMPLETE  Result Date: 01/04/2021    ECHOCARDIOGRAM REPORT   Patient Name:   Bobby Murillo Date of Exam: 01/04/2021 Medical Rec #:  [id number]      Height:       73.0 in Accession #:    [id number]     Weight:       200.0 lb Date of Birth:  [birth year]       BSA:          2.152 m Patient Age:    44 years       BP:           129/60 mmHg Patient Gender: M              HR:           86 bpm. Exam Location:  [facility] Procedure: 2D Echo Indications:     TIA  History:         Patient has no prior history of Echocardiogram examinations.                  Risk Factors:Hypertension, Dyslipidemia and CKD, CA.  Sonographer:     L Thornton-Maynard Referring Phys:  [id number] KZSWF NIU Diagnosing Phys: Donnelly Angelica  Sonographer Comments: Patient is > 55 yrs old. No Saline study performed. IMPRESSIONS  1. Left ventricular ejection fraction, by estimation, is 60 to 65%. The left ventricle has normal function. The left ventricle has no regional wall motion abnormalities. Left ventricular diastolic parameters were normal.  2. Right ventricular systolic function is normal. The right ventricular size is normal.  3. The mitral valve is normal in structure. No evidence of mitral valve regurgitation.  4. The aortic valve is normal in structure. Aortic valve regurgitation is not visualized. No aortic stenosis is present.  5. Possible left to right shunt seen by doppler. Conclusion(s)/Recommendation(s): Cannot exclude ASD/PFO. Consider transesophageal echocardiogram, if clinically indicated. FINDINGS  Left Ventricle: Left ventricular ejection fraction, by estimation, is 60 to 65%. The left ventricle has normal function. The left ventricle has no regional wall motion abnormalities. The  left ventricular internal cavity size was normal in size. There is  no left ventricular hypertrophy. Left ventricular diastolic parameters were normal. Right Ventricle: The right ventricular size is normal. No increase in right ventricular wall thickness. Right ventricular systolic function is normal. Left Atrium: Left atrial size was normal in size. Right Atrium: Right atrial size was normal in size. Pericardium: There is no evidence of pericardial effusion. Mitral Valve: The mitral valve is normal in structure. No evidence of mitral valve regurgitation. Tricuspid Valve: The tricuspid valve is normal in structure. Tricuspid valve regurgitation is trivial. Aortic Valve: The aortic valve is normal in structure. Aortic valve regurgitation is not visualized. No aortic stenosis is present. Aortic valve mean gradient measures 3.5 mmHg. Aortic valve peak gradient measures 7.3 mmHg. Aortic valve area, by VTI measures 2.99 cm. Pulmonic Valve: The pulmonic valve was not well visualized. Pulmonic valve regurgitation is not visualized. Aorta: The aortic root is normal in size and structure. IAS/Shunts: Possible left to right shunt seen by doppler.  LEFT VENTRICLE PLAX 2D LVIDd:         5.00 cm     Diastology LVIDs:         3.20 cm     LV e' medial:    6.75 cm/s LV PW:         1.00 cm     LV E/e' medial:  10.4 LV IVS:        0.70 cm     LV e' lateral:   7.73 cm/s LVOT diam:     2.10 cm     LV E/e' lateral: 9.1 LV SV:         74 LV SV Index:   34 LVOT Area:     3.46 cm  LV Volumes (MOD) LV vol d, MOD A2C: 42.0 ml LV vol d, MOD A4C: 73.6 ml LV vol s, MOD A2C: 23.0 ml LV vol s, MOD A4C: 25.0 ml LV SV MOD A2C:     19.0 ml LV SV MOD A4C:     73.6 ml LV SV MOD BP:      36.2 ml RIGHT VENTRICLE RV S prime:     17.00 cm/s TAPSE (M-mode): 2.5 cm LEFT ATRIUM           Index LA diam:      2.60 cm 1.21 cm/m LA Vol (A2C): 19.8 ml 9.20 ml/m LA Vol (A4C): 23.3 ml 10.83 ml/m  AORTIC VALVE AV Area (Vmax):    2.76 cm AV Area (Vmean):   2.84  cm AV Area (VTI):     2.99 cm AV Vmax:           135.50 cm/s AV Vmean:          88.800 cm/s AV VTI:            0.247 m AV Peak Grad:      7.3 mmHg AV Mean Grad:      3.5 mmHg LVOT Vmax:         108.00 cm/s LVOT Vmean:        72.800  cm/s LVOT VTI:          0.213 m LVOT/AV VTI ratio: 0.86  AORTA Ao Root diam: 3.70 cm MV E velocity: 70.30 cm/s  TRICUSPID VALVE MV A velocity: 85.70 cm/s  TR Peak grad:   24.2 mmHg MV E/A ratio:  0.82        TR Vmax:        246.00 cm/s                             SHUNTS                            Systemic VTI:  0.21 m                            Systemic Diam: 2.10 cm Donnelly Angelica Electronically signed by Donnelly Angelica Signature Date/Time: 01/04/2021/12:33:45 PM    Final     Erythrocytosis #80 year old male patient is currently admitted hospital for dysarthria left-sided stroke noted to have elevated hemoglobin/hematocrit.  #Erythrocytosis-mild etiology unclear-dehydration versus others.  Patient non-smoker.  Rule out primary bone marrow causes.  #Left-sided stroke-with critical stenosis on the left carotid  Recommendation:  #Check JAK2 mutation.  Again low clinical suspicion for polycythemia vera leading to stroke especially if patient has significant carotid stenosis on the left side.  From hematology standpoint would recommend holding off phlebotomy at this time.  Again given critical left-sided carotid stenosis agree with vascular evaluation.  Thank you Dr. Cyndia Skeeters for allowing me to participate in the care of your pleasant patient. Please do not hesitate to contact me with questions or concerns in the interim.  All questions were answered. The patient knows to call the clinic with any problems, questions or concerns.    Cammie Sickle, MD 01/04/2021 4:02 PM

## 2021-01-04 NOTE — Consult Note (Signed)
Reason for Consult: Left internal carotid artery stenosis with transischemic attack. Referring Physician: Dr. Bettina Gavia, MD  Bobby Murillo is an 80 y.o. male.  HPI: This individual presented with difficult speech and memory loss for a few weeks prior to developing worsening speech issues and was seen via the emergency room and found to have a left hemispheric small stroke.  Ultrasound showed left carotid artery stenosis that was significant.  Past Medical History:  Diagnosis Date   Abnormal LFTs    Anxiety    Basal cell carcinoma July 2012   left calf    Hyperlipidemia    prostate cancer 2010   Dr. Ronny Bacon   Sinus bradycardia by electrocardiogram     Past Surgical History:  Procedure Laterality Date   PROSTATECTOMY  11/2008   transurethral, radical, Dr. Darcus Austin    Family History  Problem Relation Age of Onset   Alzheimer's disease Mother    Stroke Father    Prostate cancer Father    Alzheimer's disease Sister    Stroke Paternal Aunt    Heart disease Paternal Uncle 24    Social History:  reports that he has never smoked. He has never used smokeless tobacco. He reports current alcohol use of about 8.0 standard drinks per week. He reports that he does not use drugs.  Allergies:  Allergies  Allergen Reactions   Alfuzosin Other (See Comments)    Other Reaction: fainting spell   Pravastatin Sodium     Other reaction(s): Muscle Pain    Medications: I have reviewed the patient's current medications. Prior to Admission:  Medications Prior to Admission  Medication Sig Dispense Refill Last Dose   acetaminophen (TYLENOL) 325 MG tablet Take 650 mg by mouth every 6 (six) hours as needed.   01/02/2021 at AFTERNOON   ALPRAZolam (XANAX) 0.5 MG tablet TAKE 1 TABLET BY MOUTH AT BEDTIME AS NEEDED FOR SLEEP 30 tablet 5 01/02/2021 at NIGHT   Aspirin Buf,AlHyd-MgHyd-CaCar, (ASCRIPTIN) 325 MG TABS Take 1 tablet by mouth daily. 360 tablet 0 01/02/2021 at NIGHT   atorvastatin (LIPITOR) 20 MG tablet  Take 1 tablet (20 mg total) by mouth 2 (two) times a week. 26 tablet 3 01/02/2021 at NIGHT   ezetimibe (ZETIA) 10 MG tablet Take 1 tablet by mouth once daily 90 tablet 0 01/02/2021 at NIGHT   fluticasone (FLONASE) 50 MCG/ACT nasal spray Place 2 sprays into both nostrils daily. 16 g 6 01/03/2021 at AM   losartan (COZAAR) 50 MG tablet Take 1 tablet by mouth once daily 90 tablet 1 01/02/2021 at NIGHT   polycarbophil (FIBERCON) 625 MG tablet Take 625 mg by mouth daily. Reported on 05/29/2015   01/02/2021 at NIGHT   TURMERIC PO Take by mouth.   01/03/2021 at AM   Ascorbic Acid (VITAMIN C PO) Take by mouth. (Patient not taking: No sig reported)   Not Taking   Cyanocobalamin (B-12 PO) Take by mouth. (Patient not taking: No sig reported)   Not Taking   Glucosamine-Chondroitin (GLUCOSAMINE CHONDR COMPLEX PO) Take 1 tablet by mouth daily. tab by mouth daily       Results for orders placed or performed during the hospital encounter of 01/03/21 (from the past 48 hour(s))  CBG monitoring, ED     Status: Abnormal   Collection Time: 01/03/21  2:58 PM  Result Value Ref Range   Glucose-Capillary 105 (H) 70 - 99 mg/dL    Comment: Glucose reference range applies only to samples taken after fasting for at least  8 hours.   Comment 1 Notify RN    Comment 2 Document in Chart   Protime-INR     Status: None   Collection Time: 01/03/21  3:00 PM  Result Value Ref Range   Prothrombin Time 13.6 11.4 - 15.2 seconds   INR 1.0 0.8 - 1.2    Comment: (NOTE) INR goal varies based on device and disease states. Performed at [facility], [address] [place], [place] [address]   APTT     Status: None   Collection Time: 01/03/21  3:00 PM  Result Value Ref Range   aPTT 31 24 - 36 seconds    Comment: Performed at [facility], [address] [place], [place] [address]  CBC     Status: Abnormal   Collection Time: 01/03/21  3:00 PM  Result Value Ref Range   WBC 12.0 (H) 4.0 - 10.5 K/uL   RBC 5.50  4.22 - 5.81 MIL/uL   Hemoglobin 17.9 (H) 13.0 - 17.0 g/dL   HCT 50.2 39.0 - 52.0 %   MCV 91.3 80.0 - 100.0 fL   MCH 32.5 26.0 - 34.0 pg   MCHC 35.7 30.0 - 36.0 g/dL   RDW 12.7 11.5 - 15.5 %   Platelets 215 150 - 400 K/uL   nRBC 0.0 0.0 - 0.2 %    Comment: Performed at [facility], [address] [place], [place] [address]  Differential     Status: Abnormal   Collection Time: 01/03/21  3:00 PM  Result Value Ref Range   Neutrophils Relative % 85 %   Neutro Abs 10.2 (H) 1.7 - 7.7 K/uL   Lymphocytes Relative 6 %   Lymphs Abs 0.8 0.7 - 4.0 K/uL   Monocytes Relative 7 %   Monocytes Absolute 0.9 0.1 - 1.0 K/uL   Eosinophils Relative 1 %   Eosinophils Absolute 0.1 0.0 - 0.5 K/uL   Basophils Relative 0 %   Basophils Absolute 0.1 0.0 - 0.1 K/uL   Immature Granulocytes 1 %   Abs Immature Granulocytes 0.06 0.00 - 0.07 K/uL    Comment: Performed at [facility], [address] [place], [place] [address]  Comprehensive metabolic panel     Status: Abnormal   Collection Time: 01/03/21  3:00 PM  Result Value Ref Range   Sodium 138 135 - 145 mmol/L   Potassium 4.1 3.5 - 5.1 mmol/L   Chloride 103 98 - 111 mmol/L   CO2 26 22 - 32 mmol/L   Glucose, Bld 94 70 - 99 mg/dL    Comment: Glucose reference range applies only to samples taken after fasting for at least 8 hours.   BUN 18 8 - 23 mg/dL   Creatinine, Ser 1.25 (H) 0.61 - 1.24 mg/dL   Calcium 9.6 8.9 - 10.3 mg/dL   Total Protein 7.2 6.5 - 8.1 g/dL   Albumin 4.0 3.5 - 5.0 g/dL   AST 34 15 - 41 U/L   ALT 33 0 - 44 U/L   Alkaline Phosphatase 87 38 - 126 U/L   Total Bilirubin 1.7 (H) 0.3 - 1.2 mg/dL   GFR, Estimated 59 (L) >60 mL/min    Comment: (NOTE) Calculated using the CKD-EPI Creatinine Equation (2021)    Anion gap 9 5 - 15    Comment: Performed at [facility], [address], [place], [place] [address]  Lipid panel     Status: Abnormal   Collection Time: 01/03/21  3:00 PM  Result Value Ref Range    Cholesterol 186 0 - 200 mg/dL   Triglycerides 93 <150 mg/dL   HDL 64 >40 mg/dL   Total CHOL/HDL Ratio 2.9 RATIO   VLDL 19 0 - 40 mg/dL   LDL Cholesterol 103 (H) 0 - 99 mg/dL    Comment:        Total Cholesterol/HDL:CHD Risk Coronary Heart Disease Risk Table                     Men   Women  1/2 Average Risk   3.4   3.3  Average Risk       5.0   4.4  2 X Average Risk   9.6   7.1  3 X Average Risk  23.4   11.0        Use the calculated Patient Ratio above and the CHD Risk Table to determine the patient's CHD Risk.        ATP III CLASSIFICATION (LDL):  <100     mg/dL   Optimal  100-129  mg/dL   Near or Above                    Optimal  130-159  mg/dL   Borderline  160-189  mg/dL   High  >190     mg/dL   Very High Performed at [facility], [address] [place], [place] [address]   Hemoglobin A1c     Status: Abnormal   Collection Time: 01/03/21  3:00 PM  Result Value Ref Range   Hgb A1c MFr Bld 6.0 (H) 4.8 - 5.6 %    Comment: (NOTE)         Prediabetes: 5.7 - 6.4         Diabetes: >6.4         Glycemic control for adults with diabetes: <7.0    Mean Plasma Glucose 126 mg/dL    Comment: (NOTE) Performed At: [facility] [place], [place] [id number] Rush Farmer MD DJ:[id number]   Resp Panel by RT-PCR (Flu A&B, Covid) Nasopharyngeal Swab     Status: None   Collection Time: 01/03/21  4:15 PM   Specimen: Nasopharyngeal Swab; Nasopharyngeal(NP) swabs in vial transport medium  Result Value Ref Range   SARS Coronavirus 2 by RT PCR NEGATIVE NEGATIVE    Comment: (NOTE) SARS-CoV-2 target nucleic acids are NOT DETECTED.  The SARS-CoV-2 RNA is generally detectable in upper respiratory specimens during the acute phase of infection. The lowest concentration of SARS-CoV-2 viral copies this assay can detect is 138 copies/mL. A negative result does not preclude SARS-Cov-2 infection and should not be used as the sole basis for treatment or other  patient management decisions. A negative result may occur with  improper specimen collection/handling, submission of specimen other than nasopharyngeal swab, presence of viral mutation(s) within the areas targeted by this assay, and inadequate number of viral copies(<138 copies/mL). A negative result must be combined with clinical observations, patient history, and epidemiological information. The expected result is Negative.  Fact Sheet for Patients:  EntrepreneurPulse.com.au  Fact Sheet for Healthcare Providers:  IncredibleEmployment.be  This test is no t yet approved or cleared by the [place] FDA and  has been authorized for detection and/or diagnosis of SARS-CoV-2 by FDA under an Emergency Use Authorization (EUA). This EUA will remain  in effect (meaning this test can be used) for the duration of the COVID-19 declaration under Section 564(b)(1) of the  Act, 21 U.S.C.section 360bbb-3(b)(1), unless the authorization is terminated  or revoked sooner.       Influenza A by PCR NEGATIVE NEGATIVE   Influenza B by PCR NEGATIVE NEGATIVE    Comment: (NOTE) The Xpert Xpress SARS-CoV-2/FLU/RSV plus assay is intended as an aid in the diagnosis of influenza from Nasopharyngeal swab specimens and should not be used as a sole basis for treatment. Nasal washings and aspirates are unacceptable for Xpert Xpress SARS-CoV-2/FLU/RSV testing.  Fact Sheet for Patients: EntrepreneurPulse.com.au  Fact Sheet for Healthcare Providers: IncredibleEmployment.be  This test is not yet approved or cleared by the [place] FDA and has been authorized for detection and/or diagnosis of SARS-CoV-2 by FDA under an Emergency Use Authorization (EUA). This EUA will remain in effect (meaning this test can be used) for the duration of the COVID-19 declaration under Section 564(b)(1) of the Act, 21 U.S.C. section 360bbb-3(b)(1), unless  the authorization is terminated or revoked.  Performed at [facility], [address] [place], [place] [address]   CBC     Status: None   Collection Time: 01/04/21  5:33 AM  Result Value Ref Range   WBC 5.5 4.0 - 10.5 K/uL   RBC 5.32 4.22 - 5.81 MIL/uL   Hemoglobin 16.8 13.0 - 17.0 g/dL   HCT 47.9 39.0 - 52.0 %   MCV 90.0 80.0 - 100.0 fL   MCH 31.6 26.0 - 34.0 pg   MCHC 35.1 30.0 - 36.0 g/dL   RDW 12.7 11.5 - 15.5 %   Platelets 202 150 - 400 K/uL   nRBC 0.0 0.0 - 0.2 %    Comment: Performed at [facility], [address] [place],  [address]    CT HEAD WO CONTRAST  Result Date: 01/03/2021 CLINICAL DATA:  Neuro deficit, acute, stroke suspected. sudden onset word garbage , had 2 episodes lasting 2-3 minutes apart, 1st around 1400 and 2nd around 1415. Speech is clear at present, pt denies any numbness, weakness or other sx EXAM: CT HEAD WITHOUT CONTRAST TECHNIQUE: Contiguous axial images were obtained from the base of the skull through the vertex without intravenous contrast. COMPARISON:  MR head 05/06/2020 BRAIN: BRAIN Patchy and confluent areas of decreased attenuation are noted throughout the deep and periventricular white matter of the cerebral hemispheres bilaterally, compatible with chronic microvascular ischemic disease. Limited evaluation of the temporal lobe, occipital lobes, cerebellum due to streak artifact. No evidence of large-territorial acute infarction. No parenchymal hemorrhage. No mass lesion. No extra-axial collection. No mass effect or midline shift. No hydrocephalus. Basilar cisterns are patent. Vascular: No hyperdense vessel. Skull: No acute fracture or focal lesion. Sinuses/Orbits: Paranasal sinuses and mastoid air cells are clear. The orbits are unremarkable. Other: None. IMPRESSION: No acute intracranial abnormality in a patient with chronic microvascular ischemic changes. Consider MRI head noncontrast for further evaluation. Electronically  Signed   By: Iven Finn M.D.   On: 01/03/2021 15:43   MR ANGIO HEAD WO CONTRAST  Result Date: 01/03/2021 CLINICAL DATA:  Transient ischemic attack. Neuro deficit, acute, stroke suspected. Speech disturbance. EXAM: MRI HEAD WITHOUT CONTRAST MRA HEAD WITHOUT CONTRAST TECHNIQUE: Multiplanar, multi-echo pulse sequences of the brain and surrounding structures were acquired without intravenous contrast. Angiographic images of the Circle of Willis were acquired using MRA technique without intravenous contrast. COMPARISON:  Head CT same day FINDINGS: MRI HEAD FINDINGS Brain: Diffusion imaging shows a few punctate foci of acute cortical infarction in the left posterior frontal vertex region. These are consistent with micro  embolic infarctions in the left MCA territory. Possible minimal punctate white matter infarction posterior to the atrium of the left lateral ventricle, possibly incidental. The brainstem and cerebellum appear normal. Cerebral hemispheres otherwise show chronic small-vessel ischemic changes of the white matter. No mass, hemorrhage, hydrocephalus or extra-axial collection. Vascular: Major vessels at the base of the brain show flow. Skull and upper cervical spine: Negative Sinuses/Orbits: Clear/normal Other: None MRA HEAD FINDINGS Anterior circulation: Both internal carotid arteries are widely patent through the skull base and siphon regions. The anterior and middle cerebral vessels are patent without evidence of large vessel occlusion. There is atherosclerotic narrowing and irregularity of the more distal branch vessels. Focal occlusion is present within the right M2 inferior division. Posterior circulation: Both vertebral arteries are patent to the basilar. No basilar stenosis. There is considerable atherosclerotic disease affecting the PCA branches, more extensive on the left than the right. Severe stenosis present on both sides. Anatomic variants: None significant IMPRESSION: Few small foci of  acute infarction affecting the cortex to the brain in the left posterior frontal vertex. No hemorrhage or mass effect. Extensive chronic small-vessel ischemic changes elsewhere throughout the cerebral hemispheric white matter. Question incidental punctate acute white matter infarction just posterior to the atrium of the left lateral ventricle. No intracranial large vessel occlusion. Atherosclerotic narrowing and irregularity of the more distal MCA branches. Focal stenosis of the right M2 branch inferior division. Severe atherosclerotic disease of both PCAs with severe stenoses. Electronically Signed   By: Nelson Chimes M.D.   On: 01/03/2021 18:01   MR BRAIN WO CONTRAST  Result Date: 01/03/2021 CLINICAL DATA:  Transient ischemic attack. Neuro deficit, acute, stroke suspected. Speech disturbance. EXAM: MRI HEAD WITHOUT CONTRAST MRA HEAD WITHOUT CONTRAST TECHNIQUE: Multiplanar, multi-echo pulse sequences of the brain and surrounding structures were acquired without intravenous contrast. Angiographic images of the Circle of Willis were acquired using MRA technique without intravenous contrast. COMPARISON:  Head CT same day FINDINGS: MRI HEAD FINDINGS Brain: Diffusion imaging shows a few punctate foci of acute cortical infarction in the left posterior frontal vertex region. These are consistent with micro embolic infarctions in the left MCA territory. Possible minimal punctate white matter infarction posterior to the atrium of the left lateral ventricle, possibly incidental. The brainstem and cerebellum appear normal. Cerebral hemispheres otherwise show chronic small-vessel ischemic changes of the white matter. No mass, hemorrhage, hydrocephalus or extra-axial collection. Vascular: Major vessels at the base of the brain show flow. Skull and upper cervical spine: Negative Sinuses/Orbits: Clear/normal Other: None MRA HEAD FINDINGS Anterior circulation: Both internal carotid arteries are widely patent through the skull  base and siphon regions. The anterior and middle cerebral vessels are patent without evidence of large vessel occlusion. There is atherosclerotic narrowing and irregularity of the more distal branch vessels. Focal occlusion is present within the right M2 inferior division. Posterior circulation: Both vertebral arteries are patent to the basilar. No basilar stenosis. There is considerable atherosclerotic disease affecting the PCA branches, more extensive on the left than the right. Severe stenosis present on both sides. Anatomic variants: None significant IMPRESSION: Few small foci of acute infarction affecting the cortex to the brain in the left posterior frontal vertex. No hemorrhage or mass effect. Extensive chronic small-vessel ischemic changes elsewhere throughout the cerebral hemispheric white matter. Question incidental punctate acute white matter infarction just posterior to the atrium of the left lateral ventricle. No intracranial large vessel occlusion. Atherosclerotic narrowing and irregularity of the more distal MCA branches. Focal stenosis of  the right M2 branch inferior division. Severe atherosclerotic disease of both PCAs with severe stenoses. Electronically Signed   By: Nelson Chimes M.D.   On: 01/03/2021 18:01   US Carotid Bilateral (at [facility] and [facility] only)  Result Date: 01/03/2021 CLINICAL DATA:  TIA symptoms, garbled speech, hyperlipidemia EXAM: BILATERAL CAROTID DUPLEX ULTRASOUND TECHNIQUE: Pearline Cables scale imaging, color Doppler and duplex ultrasound were performed of bilateral carotid and vertebral arteries in the neck. COMPARISON:  None. FINDINGS: Criteria: Quantification of carotid stenosis is based on velocity parameters that correlate the residual internal carotid diameter with NASCET-based stenosis levels, using the diameter of the distal internal carotid lumen as the denominator for stenosis measurement. The following velocity measurements were obtained: RIGHT ICA: 85/26 cm/sec CCA: 585/27  cm/sec SYSTOLIC ICA/CCA RATIO:  0.8 ECA: 104 cm/sec LEFT ICA: 273/101 cm/sec CCA: 78/24 cm/sec SYSTOLIC ICA/CCA RATIO:  4.1 ECA: 145 cm/sec RIGHT CAROTID ARTERY: No significant atheromatous plaque. RIGHT VERTEBRAL ARTERY:  Normal antegrade flow. LEFT CAROTID ARTERY: There is significant nonshadowing likely noncalcified plaque at the origin of the left ICA. Significant spectral broadening within the left ICA waveform. LEFT VERTEBRAL ARTERY:  Normal antegrade flow. IMPRESSION: 1. Estimated 70-99% stenosis within the left ICA, based on grayscale imaging and Doppler interrogation. Significant plaque within the proximal left ICA on grayscale imaging without evidence of calcification or posterior shadowing. Follow-up CTA may be useful for further evaluation. 2. Estimated 0-49% stenosis within the right ICA. 3. Antegrade flow within the bilateral vertebral arteries. Electronically Signed   By: Randa Ngo M.D.   On: 01/03/2021 20:56   ECHOCARDIOGRAM COMPLETE  Result Date: 01/04/2021    ECHOCARDIOGRAM REPORT   Patient Name:   JAMA KRICHBAUM Date of Exam: 01/04/2021 Medical Rec #:  [id number]      Height:       73.0 in Accession #:    [id number]     Weight:       200.0 lb Date of Birth:  November 20, 1940       BSA:          2.152 m Patient Age:    61 years       BP:           129/60 mmHg Patient Gender: M              HR:           86 bpm. Exam Location:  [facility] Procedure: 2D Echo Indications:     TIA  History:         Patient has no prior history of Echocardiogram examinations.                  Risk Factors:Hypertension, Dyslipidemia and CKD, CA.  Sonographer:     L Thornton-Maynard Referring Phys:  [id number] TOIZT NIU Diagnosing Phys: Donnelly Angelica  Sonographer Comments: Patient is > 90 yrs old. No Saline study performed. IMPRESSIONS  1. Left ventricular ejection fraction, by estimation, is 60 to 65%. The left ventricle has normal function. The left ventricle has no regional wall motion abnormalities. Left ventricular diastolic  parameters were normal.  2. Right ventricular systolic function is normal. The right ventricular size is normal.  3. The mitral valve is normal in structure. No evidence of mitral valve regurgitation.  4. The aortic valve is normal in structure. Aortic valve regurgitation is not visualized. No aortic stenosis is present.  5. Possible left to right shunt seen by doppler. Conclusion(s)/Recommendation(s): Cannot exclude ASD/PFO. Consider transesophageal echocardiogram, if clinically  indicated. FINDINGS  Left Ventricle: Left ventricular ejection fraction, by estimation, is 60 to 65%. The left ventricle has normal function. The left ventricle has no regional wall motion abnormalities. The left ventricular internal cavity size was normal in size. There is  no left ventricular hypertrophy. Left ventricular diastolic parameters were normal. Right Ventricle: The right ventricular size is normal. No increase in right ventricular wall thickness. Right ventricular systolic function is normal. Left Atrium: Left atrial size was normal in size. Right Atrium: Right atrial size was normal in size. Pericardium: There is no evidence of pericardial effusion. Mitral Valve: The mitral valve is normal in structure. No evidence of mitral valve regurgitation. Tricuspid Valve: The tricuspid valve is normal in structure. Tricuspid valve regurgitation is trivial. Aortic Valve: The aortic valve is normal in structure. Aortic valve regurgitation is not visualized. No aortic stenosis is present. Aortic valve mean gradient measures 3.5 mmHg. Aortic valve peak gradient measures 7.3 mmHg. Aortic valve area, by VTI measures 2.99 cm. Pulmonic Valve: The pulmonic valve was not well visualized. Pulmonic valve regurgitation is not visualized. Aorta: The aortic root is normal in size and structure. IAS/Shunts: Possible left to right shunt seen by doppler.  LEFT VENTRICLE PLAX 2D LVIDd:         5.00 cm     Diastology LVIDs:         3.20 cm     LV e'  medial:    6.75 cm/s LV PW:         1.00 cm     LV E/e' medial:  10.4 LV IVS:        0.70 cm     LV e' lateral:   7.73 cm/s LVOT diam:     2.10 cm     LV E/e' lateral: 9.1 LV SV:         74 LV SV Index:   34 LVOT Area:     3.46 cm  LV Volumes (MOD) LV vol d, MOD A2C: 42.0 ml LV vol d, MOD A4C: 73.6 ml LV vol s, MOD A2C: 23.0 ml LV vol s, MOD A4C: 25.0 ml LV SV MOD A2C:     19.0 ml LV SV MOD A4C:     73.6 ml LV SV MOD BP:      36.2 ml RIGHT VENTRICLE RV S prime:     17.00 cm/s TAPSE (M-mode): 2.5 cm LEFT ATRIUM           Index LA diam:      2.60 cm 1.21 cm/m LA Vol (A2C): 19.8 ml 9.20 ml/m LA Vol (A4C): 23.3 ml 10.83 ml/m  AORTIC VALVE AV Area (Vmax):    2.76 cm AV Area (Vmean):   2.84 cm AV Area (VTI):     2.99 cm AV Vmax:           135.50 cm/s AV Vmean:          88.800 cm/s AV VTI:            0.247 m AV Peak Grad:      7.3 mmHg AV Mean Grad:      3.5 mmHg LVOT Vmax:         108.00 cm/s LVOT Vmean:        72.800 cm/s LVOT VTI:          0.213 m LVOT/AV VTI ratio: 0.86  AORTA Ao Root diam: 3.70 cm MV E velocity: 70.30 cm/s  TRICUSPID VALVE MV A velocity: 85.70 cm/s  TR Peak grad:   24.2 mmHg MV E/A ratio:  0.82        TR Vmax:        246.00 cm/s                             SHUNTS                            Systemic VTI:  0.21 m                            Systemic Diam: 2.10 cm Donnelly Angelica Electronically signed by Donnelly Angelica Signature Date/Time: 01/04/2021/12:33:45 PM    Final     Review of Systems  All other systems reviewed and are negative. Blood pressure (!) 146/80, pulse 71, temperature 98.5 F (36.9 C), temperature source Oral, resp. rate 18, height 6\' 1"  (1.854 m), weight 90.7 kg, SpO2 98 %. Physical Exam Constitutional:      Appearance: Normal appearance. He is normal weight.  HENT:     Head: Normocephalic and atraumatic.     Nose: Nose normal.  Eyes:     Extraocular Movements: Extraocular movements intact.     Conjunctiva/sclera: Conjunctivae normal.     Pupils: Pupils are equal, round,  and reactive to light.  Cardiovascular:     Rate and Rhythm: Normal rate and regular rhythm.  Pulmonary:     Effort: Pulmonary effort is normal.     Breath sounds: Normal breath sounds.  Abdominal:     General: Abdomen is flat. Bowel sounds are normal.     Palpations: Abdomen is soft.  Musculoskeletal:        General: Normal range of motion.     Cervical back: Full passive range of motion without pain, normal range of motion and neck supple.  Skin:    General: Skin is warm and dry.  Neurological:     General: No focal deficit present.     Mental Status: He is alert and oriented to person, place, and time.     Comments: Patient is noted to have pressured speech.  Psychiatric:        Mood and Affect: Mood normal.        Behavior: Behavior normal.        Thought Content: Thought content normal.        Judgment: Judgment normal.    Assessment/Plan: This is a 80 year old male patient with symptomatic left internal carotid artery stenosis and left hemispheric small stroke.  The patient will require left carotid artery endarterectomy versus carotid artery stenting.  He will need to wait 1 to 2 weeks prior to intervention from his initial symptom.  In the event that his symptoms become progressively worse he will require surgery sooner.  I will recommend full neurological evaluation and continue with antiplatelet aggregation's and maximize risk factor modification.  If no further neurological events occur he will be scheduled for elective carotid artery surgery open versus endovascular within the next 1 to 2 weeks.  Elmore Guise 01/04/2021, 2:05 PM

## 2021-01-04 NOTE — Progress Notes (Signed)
PHARMACIST - PHYSICIAN ORDER COMMUNICATION  CONCERNING: P&T Medication Policy on Herbal Medications  DESCRIPTION:  This patient's order for:  turmeric  has been noted.  This product(s) is classified as an "herbal" or natural product. Due to a lack of definitive safety studies or FDA approval, nonstandard manufacturing practices, plus the potential risk of unknown drug-drug interactions while on inpatient medications, the Pharmacy and Therapeutics Committee does not permit the use of "herbal" or natural products of this type within [facility].   ACTION TAKEN: The pharmacy department is unable to verify this order at this time and your patient has been informed of this safety policy. Please reevaluate patient's clinical condition at discharge and address if the herbal or natural product(s) should be resumed at that time.  

## 2021-01-04 NOTE — Evaluation (Signed)
Occupational Therapy Evaluation Patient Details Name: Bobby Murillo [name]: [id number] DOB: [birth year] Today's Date: 01/04/2021   History of Present Illness 79 yo male with onset of slurring of speech was admitted 10/21, resolved. 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 No hemorrhage or mass effect.  PMHx:  HTN, polycythemia, CKD 3a, vertigo, HLD, anxiety, leukocytosis, skin CA, bradycardia, prostate CA, abnormal LFT's   Clinical Impression   Pt seen for OT evaluation this date in setting of acute hospitalization d/t slurred speech which appears to have resolved at this time. Pt reports being INDEP at baseline and very active including playing tennis and golf. Pt presents this date with clear and appropriate speech, equal strength, coordination and sensation bilaterally, and generally appears close to his baseline (with confirmation from spouse who is present in room throughout). Pt does have some slight, but notable balance deficits as well as some mild shaking with finger-to-nose test, but pt and spouse reports this is baseline for pt as well. He is able to complete STS with no AD with MOD I and fxl mobility around unit with INDEP. Again, with mild balance deficits notable, but no sway or gross LOB observed. He completes all ADLs I'ly. Pt appears safe to return to home environment with spouse from OT standpoint and no f/u services are warranted.   Recommendations for follow up therapy are one component of a multi-disciplinary discharge planning process, led by the attending physician.  Recommendations may be updated based on patient status, additional functional criteria and insurance authorization.   Follow Up Recommendations  No OT follow up    Equipment Recommendations  None recommended by OT    Recommendations for Other Services       Precautions / Restrictions Precautions Precautions:  Fall Restrictions Weight Bearing Restrictions: No      Mobility Bed Mobility Overal bed mobility: Independent                  Transfers Overall transfer level: Modified independent               General transfer comment: increased time, no physical assist, good control    Balance Overall balance assessment: Needs assistance   Sitting balance-Leahy Scale: Normal       Standing balance-Leahy Scale: Good Standing balance comment: notable balnace deficits with fxl mobility, but pt is able to self-correct, demos good righting response. He is unable to sustain single leg stance for 5 seconds on either lower extremity, but reports this is his baseline.                           ADL either performed or assessed with clinical judgement   ADL Overall ADL's : Independent;At baseline                                             Vision Baseline Vision/History: 1 Wears glasses Ability to See in Adequate Light: 0 Adequate Patient Visual Report: No change from baseline Vision Assessment?: Yes Eye Alignment: Within Functional Limits Ocular Range of Motion: Within Functional Limits Alignment/Gaze Preference: Within Defined Limits Tracking/Visual Pursuits: Able to track stimulus in all quads without difficulty     Perception     Praxis      Pertinent Vitals/Pain  Pain Assessment: No/denies pain     Hand Dominance     Extremity/Trunk Assessment Upper Extremity Assessment Upper Extremity Assessment: Overall WFL for tasks assessed (some very minimal hand shaking with finger to nose test which pt and spouse confirm is his baseline.)   Lower Extremity Assessment Lower Extremity Assessment: Overall WFL for tasks assessed (good musculature and ROM WFL, but some notable balance deficits which pt and spouse confirm is baseline.)       Communication Communication Communication: No difficulties (one instance of delayed word-finding, but over  all clear, appropriate speech)   Cognition Arousal/Alertness: Awake/alert Behavior During Therapy: WFL for tasks assessed/performed Overall Cognitive Status: Within Functional Limits for tasks assessed                                     General Comments       Exercises Other Exercises Other Exercises: OT ed with pt and spouse re: role   Shoulder Instructions      Home Living Family/patient expects to be discharged to:: Private residence Living Arrangements: Spouse/significant other Available Help at Discharge: Family;Available PRN/intermittently   Home Access: Level entry                     Home Equipment: Walker - 2 wheels;Bedside commode   Additional Comments: from spouse's surgery      Prior Functioning/Environment Level of Independence: Independent        Comments: very active, plays golf and tennis        OT Problem List: Impaired balance (sitting and/or standing)      OT Treatment/Interventions:      OT Goals(Current goals can be found in the care plan section) Acute Rehab OT Goals Patient Stated Goal: to go home OT Goal Formulation: All assessment and education complete, DC therapy  OT Frequency:     Barriers to D/C:            Co-evaluation PT/OT/SLP Co-Evaluation/Treatment: Yes Reason for Co-Treatment: To address functional/ADL transfers PT goals addressed during session: Mobility/safety with mobility OT goals addressed during session: ADL's and self-care      AM-PAC OT "6 Clicks" Daily Activity     Outcome Measure Help from another person eating meals?: None Help from another person taking care of personal grooming?: None Help from another person toileting, which includes using toliet, bedpan, or urinal?: None Help from another person bathing (including washing, rinsing, drying)?: None Help from another person to put on and taking off regular upper body clothing?: None Help from another person to put on and taking  off regular lower body clothing?: None 6 Click Score: 24   End of Session Nurse Communication: Mobility status  Activity Tolerance: Patient tolerated treatment well Patient left: Other (comment);with family/visitor present (seated EOB finishing up with PT)  OT Visit Diagnosis: Unsteadiness on feet (R26.81)                Time: 2409-7353 OT Time Calculation (min): 9 min Charges:  OT General Charges $OT Visit: 1 Visit OT Evaluation $OT Eval Low Complexity: [place], [place], OTR/L ascom [phone] 01/04/21, 10:36 AM

## 2021-01-04 NOTE — Progress Notes (Signed)
PROGRESS NOTE  IZICK Bobby Murillo EHM:[id number] DOB: 1940/03/26   PCP: Crecencio Mc, MD  Patient is from: Home.  Lives with his wife.  Independently ambulates at baseline.  DOA: 01/03/2021 LOS: 0  Chief complaints:  Chief Complaint  Patient presents with   Transient Ischemic Attack     Brief Narrative / Interim history: 80 year old M with PMH of CKD-3A, polycythemia, sinus bradycardia, HTN, HLD, prostate cancer s/p prostatectomy, anxiety and depression presenting with brief speech changes about 2 PM on 10/21.  Had what looks like aphasia and incoherence that lasted about 2 to 3 minutes and resolved.  Had second episode about 15 to 30 minutes later.  No other focal neuro symptoms.  BP elevated to 184/75.  Bradycardic to 48.  CT head without acute finding.  Neurology consulted.  Loaded with Plavix.  Right brain with acute left MCA CVA.  Carotid ultrasound with 70% to 99% left ICA stenosis.  Vascular surgery consulted.  Subjective: Seen and examined earlier this morning.  No major events overnight of this morning.  She feels "fine".  No further problem with his speech.  He denies focal weakness, numbness or tingling.  Denies headache or vision change.  Denies chest pain, dyspnea, GI or UTI symptoms.  Objective: Vitals:   01/04/21 0422 01/04/21 0601 01/04/21 0806 01/04/21 1000  BP: (!) 158/71 129/60 139/67 (!) 146/86  Pulse: 91 86 67 72  Resp: 16 18 16 19   Temp: (!) 97.4 F (36.3 C) 97.9 F (36.6 C) 98.7 F (37.1 C) 98.6 F (37 C)  TempSrc: Oral Oral  Oral  SpO2: 93% 96% 96% 97%  Weight:      Height:        Intake/Output Summary (Last 24 hours) at 01/04/2021 1123 Last data filed at 01/04/2021 1024 Gross per 24 hour  Intake 120 ml  Output 425 ml  Net -305 ml   Filed Weights   01/03/21 1445  Weight: 90.7 kg    Examination:  GENERAL: No apparent distress.  Nontoxic. HEENT: MMM.  Vision and hearing grossly intact.  NECK: Supple.  No apparent JVD.  RESP: 97% on RA.  No  IWOB.  Fair aeration bilaterally. CVS:  RRR. Heart sounds normal.  ABD/GI/GU: BS+. Abd soft, NTND.  MSK/EXT:  Moves extremities. No apparent deformity. No edema.  SKIN: no apparent skin lesion or wound NEURO: Awake, alert and oriented appropriately. Speech clear. Cranial nerves II-XII intact. Motor 5/5 in all muscle groups of UE and LE bilaterally, Normal tone. Light sensation intact in all dermatomes of upper and lower ext bilaterally. Patellar reflex symmetric.  No pronator drift.  Finger to nose intact. PSYCH: Calm. Normal affect.   Procedures:  None  Microbiology summarized: [id number] and influenza PCR nonreactive.  Assessment & Plan: Acute left MCA CVA/aphasia/left ICA stenosis-symptoms resolved.  MRI brain with acute left posterior frontal vertex CVA and extensive chronic small vessel ischemic changes elsewhere throughout the cerebral hemispheric white matter.  MRA without intracranial LVO.  Carotid ultrasound with 70 to 99% left ICA stenosis.  LDL 103.  A1c 6.0%.  Loaded with 300 mg Plavix in ED -Neurology following -Follow TTE/SLP/PT/OT -Permissive hypertension -Start IV NS at 75 cc an hour to keep BP up -Continue Plavix and aspirin -Continue Zetia.  On Lipitor 20 mg twice weekly due to intolerance.  Could be a candidate for PCSK9 inhibitors. -Vascular surgery consulted about ICA stenosis  Uncontrolled hypertension: Normotensive this morning. -Permissive hypertension -IVF as above  Polycythemia: Secondary?  Resolved. -IV  fluid as above  CKD-3A: Stable Recent Labs    04/24/20 1240 05/06/20 0914 05/27/20 1527 12/23/20 1158 01/03/21 1500  BUN 18 16 22 16 18   CREATININE 1.22 1.38 1.32 1.24 1.25*  -Continue monitoring   History of vertigo: Stable  Hyperlipidemia: LDL 101.  HDL 64.  Has history of statin intolerance -Added Zetia -Could be a candidate for PSK 9 inhibitors.  Anxiety/depression: Stable -Continue home meds  Leukocytosis: Likely demargination.   Resolved.  History of prostate cancer s/p prostatectomy.  Seems to be remission. -Outpatient follow-up  Hyperbilirubinemia: Total bili 1.7. -Recheck  Body mass index is 26.39 kg/m.         DVT prophylaxis:  enoxaparin (LOVENOX) injection 40 mg Start: 01/03/21 2200  Code Status: Full code Family Communication: Updated patient's wife at bedside. Level of care: Med-Surg with telemetry monitoring Status is: Observation  The patient will require care spanning > 2 midnights and should be moved to inpatient because: Acute CVA with symptomatic left ICA stenosis that warranted vascular evaluation and intervention      Consultants:  Neurology Vascular surgery   Sch Meds:  Scheduled Meds:  aspirin EC  81 mg Oral Daily   [START ON 01/06/2021] atorvastatin  20 mg Oral Once per day on Mon Thu   clopidogrel  75 mg Oral Daily   enoxaparin (LOVENOX) injection  40 mg Subcutaneous Q24H   ezetimibe  10 mg Oral Daily   polycarbophil  625 mg Oral Daily   Continuous Infusions:  sodium chloride 75 mL/hr at 01/04/21 0834   PRN Meds:.acetaminophen **OR** acetaminophen (TYLENOL) oral liquid 160 mg/5 mL **OR** acetaminophen, ALPRAZolam, hydrALAZINE, meclizine, ondansetron (ZOFRAN) IV, senna-docusate  Antimicrobials: Anti-infectives (From admission, onward)    None        I have personally reviewed the following labs and images: CBC: Recent Labs  Lab 01/03/21 1500 01/04/21 0533  WBC 12.0* 5.5  NEUTROABS 10.2*  --   HGB 17.9* 16.8  HCT 50.2 47.9  MCV 91.3 90.0  PLT 215 202   BMP &GFR Recent Labs  Lab 01/03/21 1500  NA 138  K 4.1  CL 103  CO2 26  GLUCOSE 94  BUN 18  CREATININE 1.25*  CALCIUM 9.6   Estimated Creatinine Clearance: 55 mL/min (A) (by C-G formula based on SCr of 1.25 mg/dL (H)). Liver & Pancreas: Recent Labs  Lab 01/03/21 1500  AST 34  ALT 33  ALKPHOS 87  BILITOT 1.7*  PROT 7.2  ALBUMIN 4.0   No results for input(s): LIPASE, AMYLASE in the  last 168 hours. No results for input(s): AMMONIA in the last 168 hours. Diabetic: Recent Labs    01/03/21 1500  HGBA1C 6.0*   Recent Labs  Lab 01/03/21 1458  GLUCAP 105*   Cardiac Enzymes: No results for input(s): CKTOTAL, CKMB, CKMBINDEX, TROPONINI in the last 168 hours. No results for input(s): PROBNP in the last 8760 hours. Coagulation Profile: Recent Labs  Lab 01/03/21 1500  INR 1.0   Thyroid Function Tests: No results for input(s): TSH, T4TOTAL, FREET4, T3FREE, THYROIDAB in the last 72 hours. Lipid Profile: Recent Labs    01/03/21 1500  CHOL 186  HDL 64  LDLCALC 103*  TRIG 93  CHOLHDL 2.9   Anemia Panel: No results for input(s): VITAMINB12, FOLATE, FERRITIN, TIBC, IRON, RETICCTPCT in the last 72 hours. Urine analysis:    Component Value Date/Time   COLORURINE YELLOW 09/09/2018 [place] 09/09/2018 1343   LABSPEC 1.025 09/09/2018 1343  PHURINE 5.5 09/09/2018 1343   GLUCOSEU NEGATIVE 09/09/2018 1343   HGBUR TRACE-INTACT (A) 09/09/2018 1343   BILIRUBINUR NEGATIVE 09/09/2018 1343   KETONESUR NEGATIVE 09/09/2018 1343   UROBILINOGEN 0.2 09/09/2018 1343   NITRITE NEGATIVE 09/09/2018 1343   LEUKOCYTESUR NEGATIVE 09/09/2018 1343   Sepsis Labs: Invalid input(s): PROCALCITONIN, [place]  Microbiology: Recent Results (from the past 240 hour(s))  Resp Panel by RT-PCR (Flu A&B, Covid) Nasopharyngeal Swab     Status: None   Collection Time: 01/03/21  4:15 PM   Specimen: Nasopharyngeal Swab; Nasopharyngeal(NP) swabs in vial transport medium  Result Value Ref Range Status   SARS Coronavirus 2 by RT PCR NEGATIVE NEGATIVE Final    Comment: (NOTE) SARS-CoV-2 target nucleic acids are NOT DETECTED.  The SARS-CoV-2 RNA is generally detectable in upper respiratory specimens during the acute phase of infection. The lowest concentration of SARS-CoV-2 viral copies this assay can detect is 138 copies/mL. A negative result does not preclude  SARS-Cov-2 infection and should not be used as the sole basis for treatment or other patient management decisions. A negative result may occur with  improper specimen collection/handling, submission of specimen other than nasopharyngeal swab, presence of viral mutation(s) within the areas targeted by this assay, and inadequate number of viral copies(<138 copies/mL). A negative result must be combined with clinical observations, patient history, and epidemiological information. The expected result is Negative.  Fact Sheet for Patients:  EntrepreneurPulse.com.au  Fact Sheet for Healthcare Providers:  IncredibleEmployment.be  This test is no t yet approved or cleared by the [place] FDA and  has been authorized for detection and/or diagnosis of SARS-CoV-2 by FDA under an Emergency Use Authorization (EUA). This EUA will remain  in effect (meaning this test can be used) for the duration of the COVID-19 declaration under Section 564(b)(1) of the Act, 21 U.S.C.section 360bbb-3(b)(1), unless the authorization is terminated  or revoked sooner.       Influenza A by PCR NEGATIVE NEGATIVE Final   Influenza B by PCR NEGATIVE NEGATIVE Final    Comment: (NOTE) The Xpert Xpress SARS-CoV-2/FLU/RSV plus assay is intended as an aid in the diagnosis of influenza from Nasopharyngeal swab specimens and should not be used as a sole basis for treatment. Nasal washings and aspirates are unacceptable for Xpert Xpress SARS-CoV-2/FLU/RSV testing.  Fact Sheet for Patients: EntrepreneurPulse.com.au  Fact Sheet for Healthcare Providers: IncredibleEmployment.be  This test is not yet approved or cleared by the [place] FDA and has been authorized for detection and/or diagnosis of SARS-CoV-2 by FDA under an Emergency Use Authorization (EUA). This EUA will remain in effect (meaning this test can be used) for the duration of  the COVID-19 declaration under Section 564(b)(1) of the Act, 21 U.S.C. section 360bbb-3(b)(1), unless the authorization is terminated or revoked.  Performed at [facility], [address] [place], [place] [address]     Radiology Studies: CT HEAD WO CONTRAST  Result Date: 01/03/2021 CLINICAL DATA:  Neuro deficit, acute, stroke suspected. sudden onset word garbage , had 2 episodes lasting 2-3 minutes apart, 1st around 1400 and 2nd around 1415. Speech is clear at present, pt denies any numbness, weakness or other sx EXAM: CT HEAD WITHOUT CONTRAST TECHNIQUE: Contiguous axial images were obtained from the base of the skull through the vertex without intravenous contrast. COMPARISON:  MR head 05/06/2020 BRAIN: BRAIN Patchy and confluent areas of decreased attenuation are noted throughout the deep and periventricular white matter of the cerebral hemispheres bilaterally, compatible with chronic microvascular ischemic disease.  Limited evaluation of the temporal lobe, occipital lobes, cerebellum due to streak artifact. No evidence of large-territorial acute infarction. No parenchymal hemorrhage. No mass lesion. No extra-axial collection. No mass effect or midline shift. No hydrocephalus. Basilar cisterns are patent. Vascular: No hyperdense vessel. Skull: No acute fracture or focal lesion. Sinuses/Orbits: Paranasal sinuses and mastoid air cells are clear. The orbits are unremarkable. Other: None. IMPRESSION: No acute intracranial abnormality in a patient with chronic microvascular ischemic changes. Consider MRI head noncontrast for further evaluation. Electronically Signed   By: Iven Finn M.D.   On: 01/03/2021 15:43   MR ANGIO HEAD WO CONTRAST  Result Date: 01/03/2021 CLINICAL DATA:  Transient ischemic attack. Neuro deficit, acute, stroke suspected. Speech disturbance. EXAM: MRI HEAD WITHOUT CONTRAST MRA HEAD WITHOUT CONTRAST TECHNIQUE: Multiplanar, multi-echo pulse sequences of the brain  and surrounding structures were acquired without intravenous contrast. Angiographic images of the Circle of Willis were acquired using MRA technique without intravenous contrast. COMPARISON:  Head CT same day FINDINGS: MRI HEAD FINDINGS Brain: Diffusion imaging shows a few punctate foci of acute cortical infarction in the left posterior frontal vertex region. These are consistent with micro embolic infarctions in the left MCA territory. Possible minimal punctate white matter infarction posterior to the atrium of the left lateral ventricle, possibly incidental. The brainstem and cerebellum appear normal. Cerebral hemispheres otherwise show chronic small-vessel ischemic changes of the white matter. No mass, hemorrhage, hydrocephalus or extra-axial collection. Vascular: Major vessels at the base of the brain show flow. Skull and upper cervical spine: Negative Sinuses/Orbits: Clear/normal Other: None MRA HEAD FINDINGS Anterior circulation: Both internal carotid arteries are widely patent through the skull base and siphon regions. The anterior and middle cerebral vessels are patent without evidence of large vessel occlusion. There is atherosclerotic narrowing and irregularity of the more distal branch vessels. Focal occlusion is present within the right M2 inferior division. Posterior circulation: Both vertebral arteries are patent to the basilar. No basilar stenosis. There is considerable atherosclerotic disease affecting the PCA branches, more extensive on the left than the right. Severe stenosis present on both sides. Anatomic variants: None significant IMPRESSION: Few small foci of acute infarction affecting the cortex to the brain in the left posterior frontal vertex. No hemorrhage or mass effect. Extensive chronic small-vessel ischemic changes elsewhere throughout the cerebral hemispheric white matter. Question incidental punctate acute white matter infarction just posterior to the atrium of the left lateral  ventricle. No intracranial large vessel occlusion. Atherosclerotic narrowing and irregularity of the more distal MCA branches. Focal stenosis of the right M2 branch inferior division. Severe atherosclerotic disease of both PCAs with severe stenoses. Electronically Signed   By: Nelson Chimes M.D.   On: 01/03/2021 18:01   MR BRAIN WO CONTRAST  Result Date: 01/03/2021 CLINICAL DATA:  Transient ischemic attack. Neuro deficit, acute, stroke suspected. Speech disturbance. EXAM: MRI HEAD WITHOUT CONTRAST MRA HEAD WITHOUT CONTRAST TECHNIQUE: Multiplanar, multi-echo pulse sequences of the brain and surrounding structures were acquired without intravenous contrast. Angiographic images of the Circle of Willis were acquired using MRA technique without intravenous contrast. COMPARISON:  Head CT same day FINDINGS: MRI HEAD FINDINGS Brain: Diffusion imaging shows a few punctate foci of acute cortical infarction in the left posterior frontal vertex region. These are consistent with micro embolic infarctions in the left MCA territory. Possible minimal punctate white matter infarction posterior to the atrium of the left lateral ventricle, possibly incidental. The brainstem and cerebellum appear normal. Cerebral hemispheres otherwise show chronic small-vessel ischemic changes  of the white matter. No mass, hemorrhage, hydrocephalus or extra-axial collection. Vascular: Major vessels at the base of the brain show flow. Skull and upper cervical spine: Negative Sinuses/Orbits: Clear/normal Other: None MRA HEAD FINDINGS Anterior circulation: Both internal carotid arteries are widely patent through the skull base and siphon regions. The anterior and middle cerebral vessels are patent without evidence of large vessel occlusion. There is atherosclerotic narrowing and irregularity of the more distal branch vessels. Focal occlusion is present within the right M2 inferior division. Posterior circulation: Both vertebral arteries are patent to  the basilar. No basilar stenosis. There is considerable atherosclerotic disease affecting the PCA branches, more extensive on the left than the right. Severe stenosis present on both sides. Anatomic variants: None significant IMPRESSION: Few small foci of acute infarction affecting the cortex to the brain in the left posterior frontal vertex. No hemorrhage or mass effect. Extensive chronic small-vessel ischemic changes elsewhere throughout the cerebral hemispheric white matter. Question incidental punctate acute white matter infarction just posterior to the atrium of the left lateral ventricle. No intracranial large vessel occlusion. Atherosclerotic narrowing and irregularity of the more distal MCA branches. Focal stenosis of the right M2 branch inferior division. Severe atherosclerotic disease of both PCAs with severe stenoses. Electronically Signed   By: Nelson Chimes M.D.   On: 01/03/2021 18:01   US Carotid Bilateral (at [facility] and [facility] only)  Result Date: 01/03/2021 CLINICAL DATA:  TIA symptoms, garbled speech, hyperlipidemia EXAM: BILATERAL CAROTID DUPLEX ULTRASOUND TECHNIQUE: Pearline Cables scale imaging, color Doppler and duplex ultrasound were performed of bilateral carotid and vertebral arteries in the neck. COMPARISON:  None. FINDINGS: Criteria: Quantification of carotid stenosis is based on velocity parameters that correlate the residual internal carotid diameter with NASCET-based stenosis levels, using the diameter of the distal internal carotid lumen as the denominator for stenosis measurement. The following velocity measurements were obtained: RIGHT ICA: 85/26 cm/sec CCA: 191/47 cm/sec SYSTOLIC ICA/CCA RATIO:  0.8 ECA: 104 cm/sec LEFT ICA: 273/101 cm/sec CCA: 82/95 cm/sec SYSTOLIC ICA/CCA RATIO:  4.1 ECA: 145 cm/sec RIGHT CAROTID ARTERY: No significant atheromatous plaque. RIGHT VERTEBRAL ARTERY:  Normal antegrade flow. LEFT CAROTID ARTERY: There is significant nonshadowing likely noncalcified plaque at the  origin of the left ICA. Significant spectral broadening within the left ICA waveform. LEFT VERTEBRAL ARTERY:  Normal antegrade flow. IMPRESSION: 1. Estimated 70-99% stenosis within the left ICA, based on grayscale imaging and Doppler interrogation. Significant plaque within the proximal left ICA on grayscale imaging without evidence of calcification or posterior shadowing. Follow-up CTA may be useful for further evaluation. 2. Estimated 0-49% stenosis within the right ICA. 3. Antegrade flow within the bilateral vertebral arteries. Electronically Signed   By: Randa Ngo M.D.   On: 01/03/2021 20:56      Abbott Jasinski T. [place]  If 7PM-7AM, please contact night-coverage [email or web address] 01/04/2021, 11:23 AM

## 2021-01-04 NOTE — Consult Note (Signed)
Neurology Consultation Reason for Consult: c/f TIA Requesting Physician: Wendee Beavers  CC: Garbled speech  History is obtained from:   HPI: Bobby Murillo is a 80 y.o. male with a past medical history of hypertension, hyperlipidemia w/ statin intolerance, stage III CKD, polycythemia, sinus bradycardia (completely asymptomatic), remote prostate cancer (2010)  He reports that for the past 6 months he has been having some intermittent difficulty remembering names or certain words that has been fairly subtle and stable.  However yesterday when he was leaving [employer] he felt faint and generally unwell.  He was able to drive safely home but on arrival his wife found he could not remember many basic words and his speech was garbled.  This episode lasted about 30 minutes but he was nearly back to baseline on ED arrival.  Additionally he has been having episodes of darkness in his right-sided vision, very peripherally happening at night typically.  Regarding his statin intolerance he notes he was on pravastatin for many years but ultimately discontinued it due to muscle aches and myalgias.  He had been on Zetia but due to inadequate LDL control was started on atorvastatin 20 mg twice weekly by his PCP which he has been tolerating well for the past month.  He has lived a very active lifestyle and still continues to play golf and tennis though he does not consider himself an athlete anymore.  He reports his blood pressures at home typically run in the 150s/80s, but does not note any significant intolerance of lower blood pressures just that he has had hard to control blood pressure  Notably he had seen his primary care physician in February 2022 for vertigo which was ultimately felt to be multifactorial.  He did have an MRI brain at that time which was negative for acute process.    His polycythemia is followed by Dr. Tasia Catchings and being managed by watchful waiting  LKW: 2 PM on 10/21 tPA given?: No, symptoms  minor/rapidly resolving Premorbid modified rankin scale:      0 - No symptoms.  ROS: All other review of systems was negative except as noted in the HPI.   Past Medical History:  Diagnosis Date   Abnormal LFTs    Anxiety    Basal cell carcinoma July 2012   left calf    Hyperlipidemia    prostate cancer 2010   Dr. Ronny Bacon   Sinus bradycardia by electrocardiogram    Past Surgical History:  Procedure Laterality Date   PROSTATECTOMY  11/2008   transurethral, radical, Dr. Darcus Austin   Current Outpatient Medications  Medication Instructions   acetaminophen (TYLENOL) 650 mg, Oral, Every 6 hours PRN,     ALPRAZolam (XANAX) 0.5 MG tablet TAKE 1 TABLET BY MOUTH AT BEDTIME AS NEEDED FOR SLEEP   Ascorbic Acid (VITAMIN C PO) Take by mouth.   Aspirin Buf,AlHyd-MgHyd-CaCar, (ASCRIPTIN) 325 MG TABS 1 tablet, Oral, Daily   atorvastatin (LIPITOR) 20 mg, Oral, 2 times weekly   Cyanocobalamin (B-12 PO) Take by mouth.   ezetimibe (ZETIA) 10 MG tablet Take 1 tablet by mouth once daily   fluticasone (FLONASE) 50 MCG/ACT nasal spray 2 sprays, Each Nare, Daily   Glucosamine-Chondroitin (GLUCOSAMINE CHONDR COMPLEX PO) 1 tablet, Oral, Daily, tab by mouth daily   losartan (COZAAR) 50 MG tablet Take 1 tablet by mouth once daily   polycarbophil (FIBERCON) 625 mg, Oral, Daily, Reported on 05/29/2015   TURMERIC PO Oral    Current Facility-Administered Medications:    0.9 %  sodium chloride infusion, , Intravenous, Continuous, Cyndia Skeeters, Taye T, MD, Last Rate: 75 mL/hr at 01/04/21 0834, New Bag at 01/04/21 0834   acetaminophen (TYLENOL) tablet 650 mg, 650 mg, Oral, Q4H PRN **OR** acetaminophen (TYLENOL) 160 MG/5ML solution 650 mg, 650 mg, Per Tube, Q4H PRN **OR** acetaminophen (TYLENOL) suppository 650 mg, 650 mg, Rectal, Q4H PRN, Ivor Costa, MD   ALPRAZolam Duanne Moron) tablet 0.5 mg, 0.5 mg, Oral, QHS PRN, Ivor Costa, MD   aspirin EC tablet 81 mg, 81 mg, Oral, Daily, Ivor Costa, MD, 81 mg at 01/04/21 0830   [START ON  01/06/2021] atorvastatin (LIPITOR) tablet 20 mg, 20 mg, Oral, Once per day on Mon Thu, Niu, Xilin, MD   clopidogrel (PLAVIX) tablet 75 mg, 75 mg, Oral, Daily, Ivor Costa, MD, 75 mg at 01/04/21 0830   enoxaparin (LOVENOX) injection 40 mg, 40 mg, Subcutaneous, Q24H, Ivor Costa, MD, 40 mg at 01/03/21 2237   ezetimibe (ZETIA) tablet 10 mg, 10 mg, Oral, Daily, Ivor Costa, MD, 10 mg at 01/04/21 0831   hydrALAZINE (APRESOLINE) injection 5 mg, 5 mg, Intravenous, Q2H PRN, Ivor Costa, MD   meclizine (ANTIVERT) tablet 25 mg, 25 mg, Oral, BID PRN, Ivor Costa, MD   ondansetron [facility]) injection 4 mg, 4 mg, Intravenous, Q8H PRN, Ivor Costa, MD   polycarbophil (FIBERCON) tablet 625 mg, 625 mg, Oral, Daily, Ivor Costa, MD, 625 mg at 01/04/21 0831   senna-docusate (Senokot-S) tablet 1 tablet, 1 tablet, Oral, QHS PRN, Ivor Costa, MD   Family History  Problem Relation Age of Onset   Alzheimer's disease Mother    Stroke Father    Prostate cancer Father    Alzheimer's disease Sister    Stroke Paternal Aunt    Heart disease Paternal Uncle 48  -Early onset Alzheimer's disease on the maternal side   Social History:  reports that he has never smoked. He has never used smokeless tobacco. He reports current alcohol use of about 8.0 standard drinks per week. He reports that he does not use drugs.   Exam: Current vital signs: BP 139/67 (BP Location: Left Arm)   Pulse 67   Temp 98.7 F (37.1 C)   Resp 16   Ht 6\' 1"  (1.854 m)   Wt 90.7 kg   SpO2 96%   BMI 26.39 kg/m  Vital signs in last 24 hours: Temp:  [97.4 F (36.3 C)-98.7 F (37.1 C)] 98.7 F (37.1 C) (10/22 0806) Pulse Rate:  [33-97] 67 (10/22 0806) Resp:  [13-19] 16 (10/22 0806) BP: (92-184)/(60-142) 139/67 (10/22 0806) SpO2:  [93 %-97 %] 96 % (10/22 0806) Weight:  [90.7 kg] 90.7 kg (10/21 1445)   Physical Exam  Constitutional: Appears well-developed and well-nourished.  Psych: Affect appropriate to situation, pleasant and  cooperative Eyes: No scleral injection HENT: No oropharyngeal obstruction.  MSK: no joint deformities.  Cardiovascular: Normal rate and regular rhythm.  Respiratory: Effort normal, non-labored breathing GI: Soft.  No distension. There is no tenderness.  Skin: Warm dry and intact visible skin  Neuro: Mental Status: Patient is awake, alert, oriented to person, place, month, year, and situation. Patient is able to give a clear and coherent history. Has some mild difficulty remembering the names of his cholesterol medications, otherwise no significant word finding difficulty, speech fluent and able to comprehend, name and repeat Cranial Nerves: II: Visual Fields are full. Pupils are equal, round, and reactive to light.  III,IV, VI: EOMI without ptosis or diploplia.  V: Facial sensation is symmetric to temperature VII: Facial  movement is symmetric.  VIII: hearing is intact to voice X: Uvula elevates symmetrically XI: Shoulder shrug is symmetric. XII: tongue is midline without atrophy or fasciculations.  Motor: Tone is normal. Bulk is normal. 5/5 strength was present in all four extremities.  Sensory: Sensation is symmetric to light touch and temperature in the arms and legs.   Deep Tendon Reflexes: 1+ and symmetric in the biceps and brachioradialis, 2+ patellae.  Cerebellar: FNF and HKS are intact bilaterally  NIHSS total 1 Score breakdown: Very mild aphasia  I have reviewed labs in epic and the results pertinent to this consultation are:  Basic Metabolic Panel: Recent Labs  Lab 01/03/21 1500  NA 138  K 4.1  CL 103  CO2 26  GLUCOSE 94  BUN 18  CREATININE 1.25*  CALCIUM 9.6  GFR 59  CBC: Recent Labs  Lab 01/03/21 1500 01/04/21 0533  WBC 12.0* 5.5  NEUTROABS 10.2*  --   HGB 17.9* 16.8  HCT 50.2 47.9  MCV 91.3 90.0  PLT 215 202    Coagulation Studies: Recent Labs    01/03/21 1500  LABPROT 13.6  INR 1.0     Lab Results  Component Value Date   HGBA1C  6.0 (H) 01/03/2021    Lab Results  Component Value Date   CHOL 186 01/03/2021   HDL 64 01/03/2021   LDLCALC 103 (H) 01/03/2021   LDLDIRECT 184.0 12/30/2015   TRIG 93 01/03/2021   CHOLHDL 2.9 01/03/2021     I have personally reviewed the images obtained and agree with radiology:  MRI brain without contrast, MRA head without contrast:  Few small foci of acute infarction affecting the cortex to the brain in the left posterior frontal vertex. No hemorrhage or mass effect.   Extensive chronic small-vessel ischemic changes elsewhere throughout the cerebral hemispheric white matter. Question incidental punctate acute white matter infarction just posterior to the atrium of the left lateral ventricle.   No intracranial large vessel occlusion. Atherosclerotic narrowing and irregularity of the more distal MCA branches. Focal stenosis of the right M2 branch inferior division. Severe atherosclerotic disease of both PCAs with severe stenoses.         Carotid ultrasound: 1. Estimated 70-99% stenosis within the left ICA, based on grayscale imaging and Doppler interrogation. Significant plaque within the proximal left ICA on grayscale imaging without evidence of calcification or posterior shadowing. Follow-up CTA may be useful for further evaluation. 2. Estimated 0-49% stenosis within the right ICA. 3. Antegrade flow within the bilateral vertebral arteries.   Echo 01/04/2021  1. Left ventricular ejection fraction, by estimation, is 60 to 65%. The left ventricle has normal function. The left ventricle has no regional  wall motion abnormalities. Left ventricular diastolic parameters were normal.   2. Right ventricular systolic function is normal. The right ventricular size is normal.   3. The mitral valve is normal in structure. No evidence of mitral valve regurgitation.   4. The aortic valve is normal in structure. Aortic valve regurgitation is not visualized. No aortic stenosis is  present.   5. Possible left to right shunt seen by doppler.   Impression: This is a 80 year old man with past medical history significant for hypertension, hyperlipidemia, prediabetes, polycythemia presenting with word finding difficulties found to have punctate left MCA territory strokes with a 70% left ICA stenosis.  His report of intermittent word finding difficulties in the past 6 months as well as intermittent right-sided vision change that is somewhat subtle is likely related to  this stenosis, for which I will consult vascular surgery.  He does have room for medical optimization given his cholesterol and his blood pressures are not well controlled at home, and he has only been on aspirin in the past  Recommendations: -S/p 300 mg Plavix and 325 of aspirin yesterday on my recommendation, continue 75 mg Plavix daily and 81 mg aspirin daily for 90 days, though this may be adjusted by vascular surgery pending potential intervention -Continue to gradually increase statin dose, defer on exact escalation regimen to primary team/PCP.  Goal LDL less than 70 -Continue risk factor modification counseling, diet, exercise -Appreciate vascular surgery evaluation for consideration of intervention -Appreciate hematology input on management of polycythemia in this setting, with comment on utility of initiation of hydroxyurea or phlebotomy  -Appreciate management of comorbidities per primary team   Lesleigh Noe MD-PhD [facility] [phone] [facility] coverage for [facility] is from 8 AM to 4 AM in-house and 4 PM to 8 PM by telephone/video. 8 PM to 8 AM emergent questions or overnight urgent questions should be addressed to Teleneurology On-call or [facility] neurohospitalist; contact information can be found on AMION

## 2021-01-04 NOTE — ED Notes (Signed)
Requested xanax for sleep per patient's request, new orders placed by Randol Kern NP.

## 2021-01-04 NOTE — Progress Notes (Signed)
SLP Cancellation Note  Patient Details Name: Bobby Murillo [name]: [id number] DOB: 08-22-1940   Cancelled treatment:       Reason Eval/Treat Not Completed: SLP screened, no needs identified, will sign off (chart reviewed) Met w/ pt and Wife in room. Pt denied any difficulty swallowing and is currently on a regular diet; tolerates swallowing pills w/ water per NSG. Pt conversed in general conversation w/out overt expressive/receptive deficits noted; noted similar when pt was walking in the hall w/ OT discipline earlier. Pt denied any gross speech-language deficits; he stated he a "trouble" w/ peoples names at times. Speech clear. Noted MRI results; Carotid results indicating "Estimated 70-99% stenosis within the left ICA". Pt is having f/u re: this. Discussed ways to lessen stress currently as well as maintain good sleep, hydration. Wife/pt agreed.  No further skilled ST services indicated as pt appears at general baseline. Encouraged him to f/u w/ PCP for referral if any consistent issues noted post treatments if needed. Pt agreed. NSG to reconsult if any change in status while admitted.       Orinda Kenner, MS, CCC-SLP Speech Language Pathologist [facility] [phone] [facility] 01/04/2021, 10:58 AM

## 2021-01-05 DIAGNOSIS — I63232 Cerebral infarction due to unspecified occlusion or stenosis of left carotid arteries: Secondary | ICD-10-CM | POA: Diagnosis not present

## 2021-01-05 DIAGNOSIS — I10 Essential (primary) hypertension: Secondary | ICD-10-CM | POA: Diagnosis not present

## 2021-01-05 DIAGNOSIS — R001 Bradycardia, unspecified: Secondary | ICD-10-CM

## 2021-01-05 DIAGNOSIS — I63312 Cerebral infarction due to thrombosis of left middle cerebral artery: Secondary | ICD-10-CM

## 2021-01-05 DIAGNOSIS — D751 Secondary polycythemia: Secondary | ICD-10-CM | POA: Diagnosis not present

## 2021-01-05 DIAGNOSIS — I6529 Occlusion and stenosis of unspecified carotid artery: Secondary | ICD-10-CM | POA: Diagnosis present

## 2021-01-05 DIAGNOSIS — I63512 Cerebral infarction due to unspecified occlusion or stenosis of left middle cerebral artery: Secondary | ICD-10-CM | POA: Diagnosis not present

## 2021-01-05 DIAGNOSIS — Q211 Atrial septal defect, unspecified: Secondary | ICD-10-CM

## 2021-01-05 DIAGNOSIS — I6522 Occlusion and stenosis of left carotid artery: Secondary | ICD-10-CM | POA: Diagnosis not present

## 2021-01-05 HISTORY — DX: Cerebral infarction due to thrombosis of left middle cerebral artery: I63.312

## 2021-01-05 LAB — HEPATIC FUNCTION PANEL
ALT: 26 U/L (ref 0–44)
AST: 23 U/L (ref 15–41)
Albumin: 3.4 g/dL — ABNORMAL LOW (ref 3.5–5.0)
Alkaline Phosphatase: 72 U/L (ref 38–126)
Bilirubin, Direct: 0.2 mg/dL (ref 0.0–0.2)
Indirect Bilirubin: 1.2 mg/dL — ABNORMAL HIGH (ref 0.3–0.9)
Total Bilirubin: 1.4 mg/dL — ABNORMAL HIGH (ref 0.3–1.2)
Total Protein: 6.2 g/dL — ABNORMAL LOW (ref 6.5–8.1)

## 2021-01-05 MED ORDER — ASPIRIN 81 MG PO TBEC: 81.0000 mg | DELAYED_RELEASE_TABLET | Freq: Every day | ORAL | 11 refills | Status: DC

## 2021-01-05 MED ORDER — CLOPIDOGREL BISULFATE 75 MG PO TABS: 75.0000 mg | ORAL_TABLET | Freq: Every day | ORAL | 1 refills | Status: DC

## 2021-01-05 NOTE — Progress Notes (Signed)
Patient being discharged home. IV removed before discharge. Went over discharge instructions with patient as well as discharge medications. All questions were answered and patient states that he understands. Patient going home POV with wife.

## 2021-01-05 NOTE — Discharge Summary (Signed)
Physician Discharge Summary  HERMANN DOTTAVIO DUK:[id number] DOB: 03-Mar-1941 DOA: 01/03/2021  PCP: Crecencio Mc, MD  Admit date: 01/03/2021 Discharge date: 01/05/2021 Admitted From: Home Disposition: Home Recommendations for Outpatient Follow-up:  Follow ups as below. Please obtain CBC/BMP/Mag at follow up Outpatient follow-up with vascular surgery as below Ambulatory referral to cardiology for atrial septal defect, bradycardia and lipid management Please follow up on the following pending results: None Home Health: Not indicated Equipment/Devices: Not indicated Discharge Condition: Stable CODE STATUS: Full code  Follow-up Information     Crecencio Mc, MD. Schedule an appointment as soon as possible for a visit in 1 week(s).   Specialty: Internal Medicine Contact information: [address] [place] [address] [phone]         Elmore Guise, MD. Schedule an appointment as soon as possible for a visit in 1 week(s).   Specialty: Vascular Surgery Contact information: [place] [address] [phone]                Hospital Course: 80 year old M with PMH of CKD-3A, polycythemia, sinus bradycardia, HTN, HLD, prostate cancer s/p prostatectomy, anxiety and depression presenting with brief speech changes about 2 PM on 10/21.  Had what looks like aphasia and incoherence that lasted about 2 to 3 minutes and resolved.  Had second episode about 15 to 30 minutes later.  No other focal neuro symptoms.  BP elevated to 184/75.  Bradycardic to 48.  CT head without acute finding.  Neurology consulted.  Loaded with Plavix.  Right brain with acute left MCA CVA.  Carotid ultrasound with 70% to 99% left ICA stenosis.  Vascular surgery consulted and recommended outpatient follow-up for management of ICA stenosis.  Hematology consulted for erythrocytosis and did not feel phlebotomy was indicated at this time but ordered JAK2 mutation to rule out  primary bone marrow causes.  Patient is discharged to low-dose aspirin and Plavix.  Has been referred to cardiology for ASD and lipid management due to statin intolerance.  See individual problem list below for more on hospital course.  Discharge Diagnoses:  Acute left MCA CVA/aphasia/left ICA stenosis-symptoms resolved.  MRI brain with acute left posterior frontal vertex CVA and extensive chronic small vessel ischemic changes elsewhere throughout the cerebral hemispheric white matter.  MRA without intracranial LVO.  Carotid ultrasound with 70 to 99% left ICA stenosis.  TTE with LVEF of 60 to 65% and possible left-to-right shunt.  LDL 103.  A1c 6.0%.  Loaded with 300 mg Plavix in ED.  -Discharged on Plavix and aspirin -Vascular surgery recommended outpatient follow-up for ICA stenosis -Continue Zetia.  On Lipitor 20 mg twice weekly due to intolerance. -Ambulatory referral to cardiology for possible intracardiac shunt and lipid management   Uncontrolled hypertension: Improved. -Resume home losartan on discharge.   Polycythemia/erythrocytosis: Secondary?  Not a smoker.  No lung disease.  Not on androgens. -Repeat CBC outpatient and consider referral to hematology   CKD-3A: Stable -Recheck at follow-up   Sinus bradycardia: Not symptomatic from this.  Not on nodal blocking agent. -Outpatient follow-up with cardiology   History of vertigo: Stable   Hyperlipidemia: LDL 101.  HDL 64.  Has history of statin intolerance -Zetia and statin as above   Anxiety/depression: Stable -Continue home meds   Leukocytosis: Likely demargination.  Resolved.   History of prostate cancer s/p prostatectomy.  Seems to be remission.  -Outpatient follow-up for surveillance   Hyperbilirubinemia: Total bili 1.7.  Improved.  Body mass index  is 26.39 kg/m.           Discharge Exam: Vitals:   01/04/21 1956 01/05/21 0000 01/05/21 0358 01/05/21 0738  BP: (!) 161/76 109/70 (!) 141/84 121/73  Pulse: 70  (!) 42 (!) 47 (!) 43  Temp: 98.1 F (36.7 C) 97.9 F (36.6 C) 97.8 F (36.6 C) 97.8 F (36.6 C)  Resp: 15 20 18 20   Height:      Weight:      SpO2: 98% 94% 95% 95%  TempSrc: Oral  Oral Oral  BMI (Calculated):         GENERAL: No apparent distress.  Nontoxic. HEENT: MMM.  Vision and hearing grossly intact.  NECK: Supple.  No apparent JVD.  RESP:  No IWOB.  Fair aeration bilaterally. CVS:  RRR. Heart sounds normal.  ABD/GI/GU: Bowel sounds present. Soft. Non tender.  MSK/EXT:  Moves extremities. No apparent deformity. No edema.  SKIN: no apparent skin lesion or wound NEURO: Awake and alert.  Oriented appropriately.  No apparent focal neuro deficit. PSYCH: Calm. Normal affect.   Discharge Instructions  Discharge Instructions     Ambulatory referral to Cardiology   Complete by: As directed    Diet - low sodium heart healthy   Complete by: As directed    Discharge instructions   Complete by: As directed    It has been a pleasure taking care of you!  You were hospitalized with word finding difficulty likely due to stroke as seen on MRI of your brain.  Further tests showed narrowing of left carotid artery that needs vascular surgery intervention.  Please follow-up with vascular surgery as recommended to you.  We also sent a referral to cardiology for management of your cholesterol and the hole in your heart.  Meanwhile, we have started you on Plavix and aspirin to prevent your risk of further stroke.  It is very important that you avoid any over-the-counter pain medication other than plain Tylenol while taking those medications.  Please review your new medication list and the directions on your medications before you take them.    Take care,   Increase activity slowly   Complete by: As directed       Allergies as of 01/05/2021       Reactions   Alfuzosin Other (See Comments)   Other Reaction: fainting spell   Pravastatin Sodium    Other reaction(s): Muscle Pain         Medication List     STOP taking these medications    Ascriptin 325 MG Tabs       TAKE these medications    acetaminophen 325 MG tablet Commonly known as: TYLENOL Take 650 mg by mouth every 6 (six) hours as needed.   ALPRAZolam 0.5 MG tablet Commonly known as: XANAX TAKE 1 TABLET BY MOUTH AT BEDTIME AS NEEDED FOR SLEEP   aspirin 81 MG EC tablet Take 1 tablet (81 mg total) by mouth daily. Swallow whole.   atorvastatin 20 MG tablet Commonly known as: LIPITOR Take 1 tablet (20 mg total) by mouth 2 (two) times a week.   B-12 PO Take by mouth.   clopidogrel 75 MG tablet Commonly known as: PLAVIX Take 1 tablet (75 mg total) by mouth daily.   ezetimibe 10 MG tablet Commonly known as: ZETIA Take 1 tablet by mouth once daily   fluticasone 50 MCG/ACT nasal spray Commonly known as: FLONASE Place 2 sprays into both nostrils daily.   GLUCOSAMINE CHONDR COMPLEX PO Take 1  tablet by mouth daily. tab by mouth daily   losartan 50 MG tablet Commonly known as: COZAAR Take 1 tablet by mouth once daily   polycarbophil 625 MG tablet Commonly known as: FIBERCON Take 625 mg by mouth daily. Reported on 05/29/2015   TURMERIC PO Take by mouth.   VITAMIN C PO Take by mouth.        Consultations: Neurology Vascular surgery Hematology  Procedures/Studies: None   CT HEAD WO CONTRAST  Result Date: 01/03/2021 CLINICAL DATA:  Neuro deficit, acute, stroke suspected. sudden onset word garbage , had 2 episodes lasting 2-3 minutes apart, 1st around 1400 and 2nd around 1415. Speech is clear at present, pt denies any numbness, weakness or other sx EXAM: CT HEAD WITHOUT CONTRAST TECHNIQUE: Contiguous axial images were obtained from the base of the skull through the vertex without intravenous contrast. COMPARISON:  MR head 05/06/2020 BRAIN: BRAIN Patchy and confluent areas of decreased attenuation are noted throughout the deep and periventricular white matter of the cerebral  hemispheres bilaterally, compatible with chronic microvascular ischemic disease. Limited evaluation of the temporal lobe, occipital lobes, cerebellum due to streak artifact. No evidence of large-territorial acute infarction. No parenchymal hemorrhage. No mass lesion. No extra-axial collection. No mass effect or midline shift. No hydrocephalus. Basilar cisterns are patent. Vascular: No hyperdense vessel. Skull: No acute fracture or focal lesion. Sinuses/Orbits: Paranasal sinuses and mastoid air cells are clear. The orbits are unremarkable. Other: None. IMPRESSION: No acute intracranial abnormality in a patient with chronic microvascular ischemic changes. Consider MRI head noncontrast for further evaluation. Electronically Signed   By: Iven Finn M.D.   On: 01/03/2021 15:43   MR ANGIO HEAD WO CONTRAST  Result Date: 01/03/2021 CLINICAL DATA:  Transient ischemic attack. Neuro deficit, acute, stroke suspected. Speech disturbance. EXAM: MRI HEAD WITHOUT CONTRAST MRA HEAD WITHOUT CONTRAST TECHNIQUE: Multiplanar, multi-echo pulse sequences of the brain and surrounding structures were acquired without intravenous contrast. Angiographic images of the Circle of Willis were acquired using MRA technique without intravenous contrast. COMPARISON:  Head CT same day FINDINGS: MRI HEAD FINDINGS Brain: Diffusion imaging shows a few punctate foci of acute cortical infarction in the left posterior frontal vertex region. These are consistent with micro embolic infarctions in the left MCA territory. Possible minimal punctate white matter infarction posterior to the atrium of the left lateral ventricle, possibly incidental. The brainstem and cerebellum appear normal. Cerebral hemispheres otherwise show chronic small-vessel ischemic changes of the white matter. No mass, hemorrhage, hydrocephalus or extra-axial collection. Vascular: Major vessels at the base of the brain show flow. Skull and upper cervical spine: Negative  Sinuses/Orbits: Clear/normal Other: None MRA HEAD FINDINGS Anterior circulation: Both internal carotid arteries are widely patent through the skull base and siphon regions. The anterior and middle cerebral vessels are patent without evidence of large vessel occlusion. There is atherosclerotic narrowing and irregularity of the more distal branch vessels. Focal occlusion is present within the right M2 inferior division. Posterior circulation: Both vertebral arteries are patent to the basilar. No basilar stenosis. There is considerable atherosclerotic disease affecting the PCA branches, more extensive on the left than the right. Severe stenosis present on both sides. Anatomic variants: None significant IMPRESSION: Few small foci of acute infarction affecting the cortex to the brain in the left posterior frontal vertex. No hemorrhage or mass effect. Extensive chronic small-vessel ischemic changes elsewhere throughout the cerebral hemispheric white matter. Question incidental punctate acute white matter infarction just posterior to the atrium of the left lateral ventricle. No  intracranial large vessel occlusion. Atherosclerotic narrowing and irregularity of the more distal MCA branches. Focal stenosis of the right M2 branch inferior division. Severe atherosclerotic disease of both PCAs with severe stenoses. Electronically Signed   By: Nelson Chimes M.D.   On: 01/03/2021 18:01   MR BRAIN WO CONTRAST  Result Date: 01/03/2021 CLINICAL DATA:  Transient ischemic attack. Neuro deficit, acute, stroke suspected. Speech disturbance. EXAM: MRI HEAD WITHOUT CONTRAST MRA HEAD WITHOUT CONTRAST TECHNIQUE: Multiplanar, multi-echo pulse sequences of the brain and surrounding structures were acquired without intravenous contrast. Angiographic images of the Circle of Willis were acquired using MRA technique without intravenous contrast. COMPARISON:  Head CT same day FINDINGS: MRI HEAD FINDINGS Brain: Diffusion imaging shows a few  punctate foci of acute cortical infarction in the left posterior frontal vertex region. These are consistent with micro embolic infarctions in the left MCA territory. Possible minimal punctate white matter infarction posterior to the atrium of the left lateral ventricle, possibly incidental. The brainstem and cerebellum appear normal. Cerebral hemispheres otherwise show chronic small-vessel ischemic changes of the white matter. No mass, hemorrhage, hydrocephalus or extra-axial collection. Vascular: Major vessels at the base of the brain show flow. Skull and upper cervical spine: Negative Sinuses/Orbits: Clear/normal Other: None MRA HEAD FINDINGS Anterior circulation: Both internal carotid arteries are widely patent through the skull base and siphon regions. The anterior and middle cerebral vessels are patent without evidence of large vessel occlusion. There is atherosclerotic narrowing and irregularity of the more distal branch vessels. Focal occlusion is present within the right M2 inferior division. Posterior circulation: Both vertebral arteries are patent to the basilar. No basilar stenosis. There is considerable atherosclerotic disease affecting the PCA branches, more extensive on the left than the right. Severe stenosis present on both sides. Anatomic variants: None significant IMPRESSION: Few small foci of acute infarction affecting the cortex to the brain in the left posterior frontal vertex. No hemorrhage or mass effect. Extensive chronic small-vessel ischemic changes elsewhere throughout the cerebral hemispheric white matter. Question incidental punctate acute white matter infarction just posterior to the atrium of the left lateral ventricle. No intracranial large vessel occlusion. Atherosclerotic narrowing and irregularity of the more distal MCA branches. Focal stenosis of the right M2 branch inferior division. Severe atherosclerotic disease of both PCAs with severe stenoses. Electronically Signed   By:  Nelson Chimes M.D.   On: 01/03/2021 18:01   US Carotid Bilateral (at [facility] and [facility] only)  Result Date: 01/03/2021 CLINICAL DATA:  TIA symptoms, garbled speech, hyperlipidemia EXAM: BILATERAL CAROTID DUPLEX ULTRASOUND TECHNIQUE: Pearline Cables scale imaging, color Doppler and duplex ultrasound were performed of bilateral carotid and vertebral arteries in the neck. COMPARISON:  None. FINDINGS: Criteria: Quantification of carotid stenosis is based on velocity parameters that correlate the residual internal carotid diameter with NASCET-based stenosis levels, using the diameter of the distal internal carotid lumen as the denominator for stenosis measurement. The following velocity measurements were obtained: RIGHT ICA: 85/26 cm/sec CCA: 093/26 cm/sec SYSTOLIC ICA/CCA RATIO:  0.8 ECA: 104 cm/sec LEFT ICA: 273/101 cm/sec CCA: 71/24 cm/sec SYSTOLIC ICA/CCA RATIO:  4.1 ECA: 145 cm/sec RIGHT CAROTID ARTERY: No significant atheromatous plaque. RIGHT VERTEBRAL ARTERY:  Normal antegrade flow. LEFT CAROTID ARTERY: There is significant nonshadowing likely noncalcified plaque at the origin of the left ICA. Significant spectral broadening within the left ICA waveform. LEFT VERTEBRAL ARTERY:  Normal antegrade flow. IMPRESSION: 1. Estimated 70-99% stenosis within the left ICA, based on grayscale imaging and Doppler interrogation. Significant plaque within the proximal  left ICA on grayscale imaging without evidence of calcification or posterior shadowing. Follow-up CTA may be useful for further evaluation. 2. Estimated 0-49% stenosis within the right ICA. 3. Antegrade flow within the bilateral vertebral arteries. Electronically Signed   By: Randa Ngo M.D.   On: 01/03/2021 20:56   ECHOCARDIOGRAM COMPLETE  Result Date: 01/04/2021    ECHOCARDIOGRAM REPORT   Patient Name:   HERVEY WEDIG Date of Exam: 01/04/2021 Medical Rec #:  [id number]      Height:       73.0 in Accession #:    [id number]     Weight:       200.0 lb Date of Birth:   08/25/1940       BSA:          2.152 m Patient Age:    80 years       BP:           129/60 mmHg Patient Gender: M              HR:           86 bpm. Exam Location:  [facility] Procedure: 2D Echo Indications:     TIA  History:         Patient has no prior history of Echocardiogram examinations.                  Risk Factors:Hypertension, Dyslipidemia and CKD, CA.  Sonographer:     L Thornton-Maynard Referring Phys:  [id number] KXFGH NIU Diagnosing Phys: Donnelly Angelica  Sonographer Comments: Patient is > 60 yrs old. No Saline study performed. IMPRESSIONS  1. Left ventricular ejection fraction, by estimation, is 60 to 65%. The left ventricle has normal function. The left ventricle has no regional wall motion abnormalities. Left ventricular diastolic parameters were normal.  2. Right ventricular systolic function is normal. The right ventricular size is normal.  3. The mitral valve is normal in structure. No evidence of mitral valve regurgitation.  4. The aortic valve is normal in structure. Aortic valve regurgitation is not visualized. No aortic stenosis is present.  5. Possible left to right shunt seen by doppler. Conclusion(s)/Recommendation(s): Cannot exclude ASD/PFO. Consider transesophageal echocardiogram, if clinically indicated. FINDINGS  Left Ventricle: Left ventricular ejection fraction, by estimation, is 60 to 65%. The left ventricle has normal function. The left ventricle has no regional wall motion abnormalities. The left ventricular internal cavity size was normal in size. There is  no left ventricular hypertrophy. Left ventricular diastolic parameters were normal. Right Ventricle: The right ventricular size is normal. No increase in right ventricular wall thickness. Right ventricular systolic function is normal. Left Atrium: Left atrial size was normal in size. Right Atrium: Right atrial size was normal in size. Pericardium: There is no evidence of pericardial effusion. Mitral Valve: The mitral valve is normal in  structure. No evidence of mitral valve regurgitation. Tricuspid Valve: The tricuspid valve is normal in structure. Tricuspid valve regurgitation is trivial. Aortic Valve: The aortic valve is normal in structure. Aortic valve regurgitation is not visualized. No aortic stenosis is present. Aortic valve mean gradient measures 3.5 mmHg. Aortic valve peak gradient measures 7.3 mmHg. Aortic valve area, by VTI measures 2.99 cm. Pulmonic Valve: The pulmonic valve was not well visualized. Pulmonic valve regurgitation is not visualized. Aorta: The aortic root is normal in size and structure. IAS/Shunts: Possible left to right shunt seen by doppler.  LEFT VENTRICLE PLAX 2D LVIDd:  5.00 cm     Diastology LVIDs:         3.20 cm     LV e' medial:    6.75 cm/s LV PW:         1.00 cm     LV E/e' medial:  10.4 LV IVS:        0.70 cm     LV e' lateral:   7.73 cm/s LVOT diam:     2.10 cm     LV E/e' lateral: 9.1 LV SV:         74 LV SV Index:   34 LVOT Area:     3.46 cm  LV Volumes (MOD) LV vol d, MOD A2C: 42.0 ml LV vol d, MOD A4C: 73.6 ml LV vol s, MOD A2C: 23.0 ml LV vol s, MOD A4C: 25.0 ml LV SV MOD A2C:     19.0 ml LV SV MOD A4C:     73.6 ml LV SV MOD BP:      36.2 ml RIGHT VENTRICLE RV S prime:     17.00 cm/s TAPSE (M-mode): 2.5 cm LEFT ATRIUM           Index LA diam:      2.60 cm 1.21 cm/m LA Vol (A2C): 19.8 ml 9.20 ml/m LA Vol (A4C): 23.3 ml 10.83 ml/m  AORTIC VALVE AV Area (Vmax):    2.76 cm AV Area (Vmean):   2.84 cm AV Area (VTI):     2.99 cm AV Vmax:           135.50 cm/s AV Vmean:          88.800 cm/s AV VTI:            0.247 m AV Peak Grad:      7.3 mmHg AV Mean Grad:      3.5 mmHg LVOT Vmax:         108.00 cm/s LVOT Vmean:        72.800 cm/s LVOT VTI:          0.213 m LVOT/AV VTI ratio: 0.86  AORTA Ao Root diam: 3.70 cm MV E velocity: 70.30 cm/s  TRICUSPID VALVE MV A velocity: 85.70 cm/s  TR Peak grad:   24.2 mmHg MV E/A ratio:  0.82        TR Vmax:        246.00 cm/s                             SHUNTS                             Systemic VTI:  0.21 m                            Systemic Diam: 2.10 cm Donnelly Angelica Electronically signed by Donnelly Angelica Signature Date/Time: 01/04/2021/12:33:45 PM    Final        The results of significant diagnostics from this hospitalization (including imaging, microbiology, ancillary and laboratory) are listed below for reference.     Microbiology: Recent Results (from the past 240 hour(s))  Resp Panel by RT-PCR (Flu A&B, Covid) Nasopharyngeal Swab     Status: None   Collection Time: 01/03/21  4:15 PM   Specimen: Nasopharyngeal Swab; Nasopharyngeal(NP) swabs in vial transport medium  Result Value Ref Range Status   SARS  Coronavirus 2 by RT PCR NEGATIVE NEGATIVE Final    Comment: (NOTE) SARS-CoV-2 target nucleic acids are NOT DETECTED.  The SARS-CoV-2 RNA is generally detectable in upper respiratory specimens during the acute phase of infection. The lowest concentration of SARS-CoV-2 viral copies this assay can detect is 138 copies/mL. A negative result does not preclude SARS-Cov-2 infection and should not be used as the sole basis for treatment or other patient management decisions. A negative result may occur with  improper specimen collection/handling, submission of specimen other than nasopharyngeal swab, presence of viral mutation(s) within the areas targeted by this assay, and inadequate number of viral copies(<138 copies/mL). A negative result must be combined with clinical observations, patient history, and epidemiological information. The expected result is Negative.  Fact Sheet for Patients:  EntrepreneurPulse.com.au  Fact Sheet for Healthcare Providers:  IncredibleEmployment.be  This test is no t yet approved or cleared by the [place] FDA and  has been authorized for detection and/or diagnosis of SARS-CoV-2 by FDA under an Emergency Use Authorization (EUA). This EUA will remain  in effect (meaning  this test can be used) for the duration of the COVID-19 declaration under Section 564(b)(1) of the Act, 21 U.S.C.section 360bbb-3(b)(1), unless the authorization is terminated  or revoked sooner.       Influenza A by PCR NEGATIVE NEGATIVE Final   Influenza B by PCR NEGATIVE NEGATIVE Final    Comment: (NOTE) The Xpert Xpress SARS-CoV-2/FLU/RSV plus assay is intended as an aid in the diagnosis of influenza from Nasopharyngeal swab specimens and should not be used as a sole basis for treatment. Nasal washings and aspirates are unacceptable for Xpert Xpress SARS-CoV-2/FLU/RSV testing.  Fact Sheet for Patients: EntrepreneurPulse.com.au  Fact Sheet for Healthcare Providers: IncredibleEmployment.be  This test is not yet approved or cleared by the [place] FDA and has been authorized for detection and/or diagnosis of SARS-CoV-2 by FDA under an Emergency Use Authorization (EUA). This EUA will remain in effect (meaning this test can be used) for the duration of the COVID-19 declaration under Section 564(b)(1) of the Act, 21 U.S.C. section 360bbb-3(b)(1), unless the authorization is terminated or revoked.  Performed at [facility], [address] [place], [place] [address]      Labs:  CBC: Recent Labs  Lab 01/03/21 1500 01/04/21 0533  WBC 12.0* 5.5  NEUTROABS 10.2*  --   HGB 17.9* 16.8  HCT 50.2 47.9  MCV 91.3 90.0  PLT 215 202   BMP &GFR Recent Labs  Lab 01/03/21 1500  NA 138  K 4.1  CL 103  CO2 26  GLUCOSE 94  BUN 18  CREATININE 1.25*  CALCIUM 9.6   Estimated Creatinine Clearance: 55 mL/min (A) (by C-G formula based on SCr of 1.25 mg/dL (H)). Liver & Pancreas: Recent Labs  Lab 01/03/21 1500 01/05/21 0351  AST 34 23  ALT 33 26  ALKPHOS 87 72  BILITOT 1.7* 1.4*  PROT 7.2 6.2*  ALBUMIN 4.0 3.4*   No results for input(s): LIPASE, AMYLASE in the last 168 hours. No results for input(s): AMMONIA in the  last 168 hours. Diabetic: Recent Labs    01/03/21 1500  HGBA1C 6.0*   Recent Labs  Lab 01/03/21 1458  GLUCAP 105*   Cardiac Enzymes: No results for input(s): CKTOTAL, CKMB, CKMBINDEX, TROPONINI in the last 168 hours. No results for input(s): PROBNP in the last 8760 hours. Coagulation Profile: Recent Labs  Lab 01/03/21 1500  INR 1.0   Thyroid Function Tests: No results  for input(s): TSH, T4TOTAL, FREET4, T3FREE, THYROIDAB in the last 72 hours. Lipid Profile: Recent Labs    01/03/21 1500  CHOL 186  HDL 64  LDLCALC 103*  TRIG 93  CHOLHDL 2.9   Anemia Panel: No results for input(s): VITAMINB12, FOLATE, FERRITIN, TIBC, IRON, RETICCTPCT in the last 72 hours. Urine analysis:    Component Value Date/Time   COLORURINE YELLOW 09/09/2018 [place] 09/09/2018 1343   LABSPEC 1.025 09/09/2018 1343   PHURINE 5.5 09/09/2018 1343   GLUCOSEU NEGATIVE 09/09/2018 1343   HGBUR TRACE-INTACT (A) 09/09/2018 1343   BILIRUBINUR NEGATIVE 09/09/2018 1343   KETONESUR NEGATIVE 09/09/2018 1343   UROBILINOGEN 0.2 09/09/2018 1343   NITRITE NEGATIVE 09/09/2018 1343   LEUKOCYTESUR NEGATIVE 09/09/2018 1343   Sepsis Labs: Invalid input(s): PROCALCITONIN, LACTICIDVEN   Time coordinating discharge: 45 minutes  SIGNED:  Mercy Riding, MD  [facility] 01/05/2021, 4:22 PM

## 2021-01-06 ENCOUNTER — Telehealth (INDEPENDENT_AMBULATORY_CARE_PROVIDER_SITE_OTHER): Payer: Self-pay

## 2021-01-06 ENCOUNTER — Telehealth: Payer: Self-pay | Admitting: Internal Medicine

## 2021-01-06 ENCOUNTER — Telehealth: Payer: Self-pay

## 2021-01-06 NOTE — Telephone Encounter (Signed)
Transition Care Management Follow-up Telephone Call Date of discharge and from where: 01/05/21-[facility] How have you been since you were released from the hospital? Patient states,"I am doing really well." Denies numbness, tingling, confusion, slurred speech, headache, N/V/D. Patient has onset of unsteady gait and blurred vision for several months however not worsening post stroke. No new symptoms presenting. Baseline per patient. Any questions or concerns? No  Items Reviewed: Did the pt receive and understand the discharge instructions provided? Yes  Medications obtained and verified? Yes , change in therapy aspirin 81mg  and added plavix 75mg . Taking all scheduled medications as directed. Any new allergies since your discharge? No  Dietary orders reviewed? Yes, low sodium, heart healthy Do you have support at home? Yes , wife  Home Care and Equipment/Supplies: Were home health services ordered? No  Functional Questionnaire: (I = Independent and D = Dependent) ADLs: I  Bathing/Dressing- I  Meal Prep- I  Eating- I  Maintaining continence- I  Transferring/Ambulation- I  Managing Meds- I  Follow up appointments reviewed:  PCP Hospital f/u appt confirmed? Yes  Scheduled to see PCP on 01/08/21 @ 11:00. [facility] f/u appt confirmed? Yes  Scheduled to see Vascular on 01/20/21 @ 1:00. Are transportation arrangements needed? No If their condition worsens, is the pt aware to call PCP or go to the Emergency Dept.? Yes Was the patient provided with contact information for the PCP's office or ED? Yes Was to pt encouraged to call back with questions or concerns? Yes

## 2021-01-06 NOTE — Telephone Encounter (Signed)
Bobby Murillo was recently hospitalized for a slight stroke and told that he needs an appointment with his pcp this week.Patient is scheduled for an appointment 01/08/21 with Dr.Tullo.

## 2021-01-06 NOTE — Telephone Encounter (Signed)
Documentation only.

## 2021-01-08 ENCOUNTER — Encounter: Payer: Self-pay | Admitting: Internal Medicine

## 2021-01-08 ENCOUNTER — Ambulatory Visit (INDEPENDENT_AMBULATORY_CARE_PROVIDER_SITE_OTHER): Payer: Medicare Other | Admitting: Internal Medicine

## 2021-01-08 ENCOUNTER — Other Ambulatory Visit: Payer: Self-pay

## 2021-01-08 VITALS — BP 158/72 | HR 72 | Temp 96.1°F | Ht 73.0 in | Wt 205.0 lb

## 2021-01-08 DIAGNOSIS — D751 Secondary polycythemia: Secondary | ICD-10-CM

## 2021-01-08 DIAGNOSIS — I6522 Occlusion and stenosis of left carotid artery: Secondary | ICD-10-CM | POA: Diagnosis not present

## 2021-01-08 DIAGNOSIS — R419 Unspecified symptoms and signs involving cognitive functions and awareness: Secondary | ICD-10-CM | POA: Diagnosis not present

## 2021-01-08 DIAGNOSIS — I10 Essential (primary) hypertension: Secondary | ICD-10-CM

## 2021-01-08 DIAGNOSIS — Z789 Other specified health status: Secondary | ICD-10-CM | POA: Diagnosis not present

## 2021-01-08 DIAGNOSIS — E785 Hyperlipidemia, unspecified: Secondary | ICD-10-CM

## 2021-01-08 DIAGNOSIS — R001 Bradycardia, unspecified: Secondary | ICD-10-CM | POA: Diagnosis not present

## 2021-01-08 DIAGNOSIS — Z8673 Personal history of transient ischemic attack (TIA), and cerebral infarction without residual deficits: Secondary | ICD-10-CM

## 2021-01-08 DIAGNOSIS — Q211 Atrial septal defect, unspecified: Secondary | ICD-10-CM | POA: Diagnosis not present

## 2021-01-08 DIAGNOSIS — Z09 Encounter for follow-up examination after completed treatment for conditions other than malignant neoplasm: Secondary | ICD-10-CM | POA: Diagnosis not present

## 2021-01-08 LAB — CBC WITH DIFFERENTIAL/PLATELET
Basophils Absolute: 0.1 10*3/uL (ref 0.0–0.1)
Basophils Relative: 0.8 % (ref 0.0–3.0)
Eosinophils Absolute: 0.2 10*3/uL (ref 0.0–0.7)
Eosinophils Relative: 2.6 % (ref 0.0–5.0)
HCT: 50.4 % (ref 39.0–52.0)
Hemoglobin: 16.7 g/dL (ref 13.0–17.0)
Lymphocytes Relative: 14.6 % (ref 12.0–46.0)
Lymphs Abs: 1 10*3/uL (ref 0.7–4.0)
MCHC: 33.1 g/dL (ref 30.0–36.0)
MCV: 93.6 fl (ref 78.0–100.0)
Monocytes Absolute: 0.8 10*3/uL (ref 0.1–1.0)
Monocytes Relative: 12.3 % — ABNORMAL HIGH (ref 3.0–12.0)
Neutro Abs: 4.8 10*3/uL (ref 1.4–7.7)
Neutrophils Relative %: 69.7 % (ref 43.0–77.0)
Platelets: 221 10*3/uL (ref 150.0–400.0)
RBC: 5.38 Mil/uL (ref 4.22–5.81)
RDW: 13.8 % (ref 11.5–15.5)
WBC: 6.8 10*3/uL (ref 4.0–10.5)

## 2021-01-08 LAB — COMPREHENSIVE METABOLIC PANEL
ALT: 33 U/L (ref 0–53)
AST: 24 U/L (ref 0–37)
Albumin: 4 g/dL (ref 3.5–5.2)
Alkaline Phosphatase: 92 U/L (ref 39–117)
BUN: 21 mg/dL (ref 6–23)
CO2: 24 mEq/L (ref 19–32)
Calcium: 9.4 mg/dL (ref 8.4–10.5)
Chloride: 106 mEq/L (ref 96–112)
Creatinine, Ser: 1.17 mg/dL (ref 0.40–1.50)
GFR: 59.68 mL/min — ABNORMAL LOW (ref >60.00)
Glucose, Bld: 78 mg/dL (ref 70–99)
Potassium: 4.3 mEq/L (ref 3.5–5.1)
Sodium: 139 mEq/L (ref 135–145)
Total Bilirubin: 1.4 mg/dL — ABNORMAL HIGH (ref 0.2–1.2)
Total Protein: 6.4 g/dL (ref 6.0–8.3)

## 2021-01-08 LAB — MAGNESIUM: Magnesium: 2.1 mg/dL (ref 1.5–2.5)

## 2021-01-08 MED ORDER — ZOSTER VAC RECOMB ADJUVANTED 50 MCG/0.5ML IM SUSR: 0.5000 mL | Freq: Once | INTRAMUSCULAR | 1 refills | Status: AC

## 2021-01-08 NOTE — Progress Notes (Signed)
Pre visit review using our clinic review tool, if applicable. No additional management support is needed unless otherwise documented below in the visit note. 

## 2021-01-08 NOTE — Progress Notes (Signed)
Subjective:  Patient ID: Bobby Murillo, male    DOB: 30-Jan-1941  Age: 80 y.o. [name]: [id number]  CC: The primary encounter diagnosis was Essential hypertension. Diagnoses of Erythrocytosis, Hospital discharge follow-up, Cognitive complaints with normal neuropsychological exam, History of CVA (cerebrovascular accident) without residual deficits, Hyperlipidemia LDL goal <100, Statin intolerance, Atrial septal defect, Sinus bradycardia, and Stenosis of left internal carotid artery were also pertinent to this visit.  HPI Bobby Murillo presents for hospital follow up Chief Complaint  Patient presents with   Follow-up    Hospital/ TIA   This visit occurred during the SARS-CoV-2 public health emergency.  Safety protocols were in place, including screening questions prior to the visit, additional usage of staff PPE, and extensive cleaning of exam room while observing appropriate contact time as indicated for disinfecting solutions.   Bobby Murillo is a 80 yr old male  with history of  CKD-3A, polycythemia, sinus bradycardia, HTN, HLD, prostate cancer s/p prostatectomy, anxiety and depression presenting with brief speech changes that were fist noted about 2 PM on 10/21..  his  aphasia and incoherence lasted about 2 to 3 minutes and resolved, followed by a second episode about 15 to 30 minutes later.  He was admitted to [facility] on Oct 21 with diagnosis of TIA accompanied by  severe hypertension .   He was found by MRI /MRA to have had a left MCA CVA attributed to left ICA stenosis.  All symptoms resolved.  MRI brain noted ab  acute left posterior frontal vertex CVA and extensive chronic small vessel ischemic changes elsewhere throughout the cerebral hemispheric white matter.  MRA without intracranial LVO.  Carotid ultrasound noted  70 to 99% left ICA stenosis.  TTE was done, with LVEF of 60 to 65% and possible left-to-right shunt.  LDL 103.  A1c 6.0%.  He was Loaded with 300 mg Plavix in ED. -He was evaluated by  Neurology and Vascular surgery as well as speech and occupation therapy.  He was discharged home on Oct 23  in improved condition on Plavix and aspirin and  advised to follow up with vascular surgery and cardiology   Current medications reviewed and all changes to baseline medications explained in detail today.  All imaging studies , diagnoses and procedures reviewed and explained   He feels fine.  He has had no recurrence of symptoms and is tolerating plavix without bleeding.  -Medication reconciliation done . Losartan 50 mg  dose reviewed.  Home readings since discharge have been 140 at home  Taking  atorvatatin 20 mg 2/week and zetia daily   Outpatient Medications Prior to Visit  Medication Sig Dispense Refill   acetaminophen (TYLENOL) 325 MG tablet Take 650 mg by mouth every 6 (six) hours as needed.     ALPRAZolam (XANAX) 0.5 MG tablet TAKE 1 TABLET BY MOUTH AT BEDTIME AS NEEDED FOR SLEEP 30 tablet 5   Ascorbic Acid (VITAMIN C PO) Take by mouth.     aspirin EC 81 MG EC tablet Take 1 tablet (81 mg total) by mouth daily. Swallow whole. 30 tablet 11   atorvastatin (LIPITOR) 20 MG tablet Take 1 tablet (20 mg total) by mouth 2 (two) times a week. 26 tablet 3   clopidogrel (PLAVIX) 75 MG tablet Take 1 tablet (75 mg total) by mouth daily. 90 tablet 1   Cyanocobalamin (B-12 PO) Take by mouth.     ezetimibe (ZETIA) 10 MG tablet Take 1 tablet by mouth once daily 90 tablet 0  fluticasone (FLONASE) 50 MCG/ACT nasal spray Place 2 sprays into both nostrils daily. 16 g 6   Glucosamine-Chondroitin (GLUCOSAMINE CHONDR COMPLEX PO) Take 1 tablet by mouth daily. tab by mouth daily     losartan (COZAAR) 50 MG tablet Take 1 tablet by mouth once daily 90 tablet 1   polycarbophil (FIBERCON) 625 MG tablet Take 625 mg by mouth daily. Reported on 05/29/2015     TURMERIC PO Take by mouth.     No facility-administered medications prior to visit.    Review of Systems;  Patient denies headache, fevers, malaise,  unintentional weight loss, skin rash, eye pain, sinus congestion and sinus pain, sore throat, dysphagia,  hemoptysis , cough, dyspnea, wheezing, chest pain, palpitations, orthopnea, edema, abdominal pain, nausea, melena, diarrhea, constipation, flank pain, dysuria, hematuria, urinary  Frequency, nocturia, numbness, tingling, seizures,  Focal weakness, Loss of consciousness,  Tremor, insomnia, depression, anxiety, and suicidal ideation.      Objective:  BP (!) 158/72   Pulse 72   Temp (!) 96.1 F (35.6 C) (Skin)   Ht 6\' 1"  (1.854 m)   Wt 205 lb (93 kg) Comment: W/ shoes on  SpO2 97%   BMI 27.05 kg/m   BP Readings from Last 3 Encounters:  01/08/21 (!) 158/72  01/05/21 121/73  12/23/20 138/80    Wt Readings from Last 3 Encounters:  01/08/21 205 lb (93 kg)  01/03/21 200 lb (90.7 kg)  12/23/20 202 lb 3.2 oz (91.7 kg)    General appearance: alert, cooperative and appears stated age Ears: normal TM's and external ear canals both ears Throat: lips, mucosa, and tongue normal; teeth and gums normal Neck: no adenopathy, no carotid bruit, supple, symmetrical, trachea midline and thyroid not enlarged, symmetric, no tenderness/mass/nodules Back: symmetric, no curvature. ROM normal. No CVA tenderness. Lungs: clear to auscultation bilaterally Heart: regular rate and rhythm, S1, S2 normal, no murmur, click, rub or gallop Abdomen: soft, non-tender; bowel sounds normal; no masses,  no organomegaly Pulses: 2+ and symmetric Skin: Skin color, texture, turgor normal. No rashes or lesions Lymph nodes: Cervical, supraclavicular, and axillary nodes normal. Neuro: CNs 2-12 intact.   Speech articulate with no word finding issues. . Muscle strength 5/5 in upper and lower exremities. Fine resting tremor bilaterally both hands cerebellar function normal. Romberg negative.  No pronator drift.   Gait normal.    Lab Results  Component Value Date   HGBA1C 6.0 (H) 01/03/2021   HGBA1C 5.8 06/09/2019   HGBA1C  5.9 05/08/2019    Lab Results  Component Value Date   CREATININE 1.17 01/08/2021   CREATININE 1.25 (H) 01/03/2021   CREATININE 1.24 12/23/2020    Lab Results  Component Value Date   WBC 6.8 01/08/2021   HGB 16.7 01/08/2021   HCT 50.4 01/08/2021   PLT 221.0 01/08/2021   GLUCOSE 78 01/08/2021   CHOL 186 01/03/2021   TRIG 93 01/03/2021   HDL 64 01/03/2021   LDLDIRECT 184.0 12/30/2015   LDLCALC 103 (H) 01/03/2021   ALT 33 01/08/2021   AST 24 01/08/2021   NA 139 01/08/2021   K 4.3 01/08/2021   CL 106 01/08/2021   CREATININE 1.17 01/08/2021   BUN 21 01/08/2021   CO2 24 01/08/2021   TSH 2.19 12/23/2020   PSA 0.00 (L) 05/06/2020   INR 1.0 01/03/2021   HGBA1C 6.0 (H) 01/03/2021   MICROALBUR <0.7 12/15/2019    ECHOCARDIOGRAM COMPLETE  Result Date: 01/04/2021    ECHOCARDIOGRAM REPORT   Patient Name:  Bobby Murillo Date of Exam: 01/04/2021 Medical Rec #:  [id number]      Height:       73.0 in Accession #:    [id number]     Weight:       200.0 lb Date of Birth:  [birth year]       BSA:          2.152 m Patient Age:    89 years       BP:           129/60 mmHg Patient Gender: M              HR:           86 bpm. Exam Location:  [facility] Procedure: 2D Echo Indications:     TIA  History:         Patient has no prior history of Echocardiogram examinations.                  Risk Factors:Hypertension, Dyslipidemia and CKD, CA.  Sonographer:     L Thornton-Maynard Referring Phys:  [id number] NIU Diagnosing Phys: Donnelly Angelica  Sonographer Comments: Patient is > 52 yrs old. No Saline study performed. IMPRESSIONS  1. Left ventricular ejection fraction, by estimation, is 60 to 65%. The left ventricle has normal function. The left ventricle has no regional wall motion abnormalities. Left ventricular diastolic parameters were normal.  2. Right ventricular systolic function is normal. The right ventricular size is normal.  3. The mitral valve is normal in structure. No evidence of mitral valve regurgitation.  4.  The aortic valve is normal in structure. Aortic valve regurgitation is not visualized. No aortic stenosis is present.  5. Possible left to right shunt seen by doppler. Conclusion(s)/Recommendation(s): Cannot exclude ASD/PFO. Consider transesophageal echocardiogram, if clinically indicated. FINDINGS  Left Ventricle: Left ventricular ejection fraction, by estimation, is 60 to 65%. The left ventricle has normal function. The left ventricle has no regional wall motion abnormalities. The left ventricular internal cavity size was normal in size. There is  no left ventricular hypertrophy. Left ventricular diastolic parameters were normal. Right Ventricle: The right ventricular size is normal. No increase in right ventricular wall thickness. Right ventricular systolic function is normal. Left Atrium: Left atrial size was normal in size. Right Atrium: Right atrial size was normal in size. Pericardium: There is no evidence of pericardial effusion. Mitral Valve: The mitral valve is normal in structure. No evidence of mitral valve regurgitation. Tricuspid Valve: The tricuspid valve is normal in structure. Tricuspid valve regurgitation is trivial. Aortic Valve: The aortic valve is normal in structure. Aortic valve regurgitation is not visualized. No aortic stenosis is present. Aortic valve mean gradient measures 3.5 mmHg. Aortic valve peak gradient measures 7.3 mmHg. Aortic valve area, by VTI measures 2.99 cm. Pulmonic Valve: The pulmonic valve was not well visualized. Pulmonic valve regurgitation is not visualized. Aorta: The aortic root is normal in size and structure. IAS/Shunts: Possible left to right shunt seen by doppler.  LEFT VENTRICLE PLAX 2D LVIDd:         5.00 cm     Diastology LVIDs:         3.20 cm     LV e' medial:    6.75 cm/s LV PW:         1.00 cm     LV E/e' medial:  10.4 LV IVS:        0.70 cm     LV e' lateral:  7.73 cm/s LVOT diam:     2.10 cm     LV E/e' lateral: 9.1 LV SV:         74 LV SV Index:   34  LVOT Area:     3.46 cm  LV Volumes (MOD) LV vol d, MOD A2C: 42.0 ml LV vol d, MOD A4C: 73.6 ml LV vol s, MOD A2C: 23.0 ml LV vol s, MOD A4C: 25.0 ml LV SV MOD A2C:     19.0 ml LV SV MOD A4C:     73.6 ml LV SV MOD BP:      36.2 ml RIGHT VENTRICLE RV S prime:     17.00 cm/s TAPSE (M-mode): 2.5 cm LEFT ATRIUM           Index LA diam:      2.60 cm 1.21 cm/m LA Vol (A2C): 19.8 ml 9.20 ml/m LA Vol (A4C): 23.3 ml 10.83 ml/m  AORTIC VALVE AV Area (Vmax):    2.76 cm AV Area (Vmean):   2.84 cm AV Area (VTI):     2.99 cm AV Vmax:           135.50 cm/s AV Vmean:          88.800 cm/s AV VTI:            0.247 m AV Peak Grad:      7.3 mmHg AV Mean Grad:      3.5 mmHg LVOT Vmax:         108.00 cm/s LVOT Vmean:        72.800 cm/s LVOT VTI:          0.213 m LVOT/AV VTI ratio: 0.86  AORTA Ao Root diam: 3.70 cm MV E velocity: 70.30 cm/s  TRICUSPID VALVE MV A velocity: 85.70 cm/s  TR Peak grad:   24.2 mmHg MV E/A ratio:  0.82        TR Vmax:        246.00 cm/s                             SHUNTS                            Systemic VTI:  0.21 m                            Systemic Diam: 2.10 cm Donnelly Angelica Electronically signed by Donnelly Angelica Signature Date/Time: 01/04/2021/12:33:45 PM    Final     Assessment & Plan:   Problem List Items Addressed This Visit     Hyperlipidemia LDL goal <100 (Chronic)    He is tolerating  combination therapy . No changes today       Statin intolerance    He is tolerating a reduced dose of lipitior (2 times weekly) and daily Zetia       Essential hypertension - Primary    Current readings at hoe have normalized on losartan 50 mg daily   Lab Results  Component Value Date   CREATININE 1.17 01/08/2021   Lab Results  Component Value Date   NA 139 01/08/2021   K 4.3 01/08/2021   CL 106 01/08/2021   CO2 24 01/08/2021         Relevant Orders   Magnesium (Completed)   Comprehensive metabolic panel (Completed)   Sinus bradycardia  Chronic and longstanding,  Asymptomatic        Erythrocytosis    Chronic,  With previous oncology evaluation reviewed with patient today  from January 2020 , which included a negative Jak 2 mutation study and  recommendations for sleep study which was declined (see below).  Agree that in the setting of severe carotid stenosis,  His polycythemia is unliikely to be causative.        Relevant Orders   CBC with Differential/Platelet (Completed)   Cognitive complaints with normal neuropsychological exam   Internal carotid artery stenosis    Referral to vascular surgery underway for definitive treatment      Atrial septal defect    Right to left shunt suggested on TTE done during admission for acute left brain MCA infarct /CVA .  Cardiology referral in progress       Hospital discharge follow-up    Patient is stable post discharge and has no new issues or questions about discharge plans at the visit today for hospital follow up.  I have reviewed the records from the hospital admission in detail with patient today and initiated the cardiology referrral and confirmed the follow up with vascular surgery .  He had actually been  referred to Neurology in early October for concerns about his cognitive decline and strong FH of Alzheimers , but was never given an  appt . Will repeat the referral now        History of CVA (cerebrovascular accident) without residual deficits   Relevant Orders   Ambulatory referral to Neurology    I am having Bobby Murillo start on Zoster Vaccine Adjuvanted. I am also having him maintain his acetaminophen, polycarbophil, Glucosamine-Chondroitin (GLUCOSAMINE CHONDR COMPLEX PO), TURMERIC PO, Cyanocobalamin (B-12 PO), Ascorbic Acid (VITAMIN C PO), atorvastatin, ALPRAZolam, losartan, fluticasone, ezetimibe, aspirin, and clopidogrel.  Meds ordered this encounter  Medications   Zoster Vaccine Adjuvanted [facility]) injection    Sig: Inject 0.5 mLs into the muscle once for 1 dose.    Dispense:  1 each    Refill:   1    There are no discontinued medications.  Follow-up: Return in about 4 weeks (around 02/05/2021).   Crecencio Mc, MD

## 2021-01-08 NOTE — Patient Instructions (Addendum)
   Please monitor your blood pressure once daily.  If your home readings are  150/80 or higher over the next 48 hours., or if the systolic is 818 or higher or the diastolic over 80,   increase the losartan to 100 mg daily    I will make the cardiology referral to evaluate if you have  the "foramen ovale" (hole in your heart )

## 2021-01-09 ENCOUNTER — Telehealth: Payer: Self-pay | Admitting: Internal Medicine

## 2021-01-09 NOTE — Telephone Encounter (Signed)
Do you have the paperwork he brought in for his appt yesterday?

## 2021-01-09 NOTE — Telephone Encounter (Signed)
Patient was seen yesterday by Dr Derrel Nip. He left his hospital discharged at the office and wanted to know if office still had them and could he pick up paper work.

## 2021-01-11 DIAGNOSIS — Z8673 Personal history of transient ischemic attack (TIA), and cerebral infarction without residual deficits: Secondary | ICD-10-CM | POA: Insufficient documentation

## 2021-01-11 DIAGNOSIS — I6930 Unspecified sequelae of cerebral infarction: Secondary | ICD-10-CM | POA: Insufficient documentation

## 2021-01-11 DIAGNOSIS — Z09 Encounter for follow-up examination after completed treatment for conditions other than malignant neoplasm: Secondary | ICD-10-CM | POA: Insufficient documentation

## 2021-01-11 NOTE — Assessment & Plan Note (Addendum)
Chronic,  With previous oncology evaluation reviewed with patient today  from January 2020 , which included a negative Jak 2 mutation study and  recommendations for sleep study which was declined (see below).  Agree that in the setting of severe carotid stenosis,  His polycythemia is unliikely to be causative.

## 2021-01-11 NOTE — Assessment & Plan Note (Signed)
He is tolerating a reduced dose of lipitior (2 times weekly) and daily Zetia

## 2021-01-11 NOTE — Assessment & Plan Note (Signed)
Current readings at hoe have normalized on losartan 50 mg daily   Lab Results  Component Value Date   CREATININE 1.17 01/08/2021   Lab Results  Component Value Date   NA 139 01/08/2021   K 4.3 01/08/2021   CL 106 01/08/2021   CO2 24 01/08/2021

## 2021-01-11 NOTE — Assessment & Plan Note (Signed)
He is tolerating  combination therapy . No changes today

## 2021-01-11 NOTE — Assessment & Plan Note (Signed)
Right to left shunt suggested on TTE done during admission for acute left brain MCA infarct /CVA .  Cardiology referral in progress

## 2021-01-11 NOTE — Assessment & Plan Note (Signed)
Chronic and longstanding,  Asymptomatic

## 2021-01-11 NOTE — Assessment & Plan Note (Signed)
Referral to vascular surgery underway for definitive treatment

## 2021-01-11 NOTE — Assessment & Plan Note (Addendum)
Patient is stable post discharge and has no new issues or questions about discharge plans at the visit today for hospital follow up.  I have reviewed the records from the hospital admission in detail with patient today and initiated the cardiology referrral and confirmed the follow up with vascular surgery .  He had actually been  referred to Neurology in early October for concerns about his cognitive decline and strong FH of Alzheimers , but was never given an  appt . Will repeat the referral now

## 2021-01-13 ENCOUNTER — Encounter: Payer: Self-pay | Admitting: Neurology

## 2021-01-20 ENCOUNTER — Encounter (INDEPENDENT_AMBULATORY_CARE_PROVIDER_SITE_OTHER): Payer: Self-pay | Admitting: Vascular Surgery

## 2021-01-20 ENCOUNTER — Ambulatory Visit (INDEPENDENT_AMBULATORY_CARE_PROVIDER_SITE_OTHER): Payer: Medicare Other | Admitting: Vascular Surgery

## 2021-01-20 ENCOUNTER — Other Ambulatory Visit: Payer: Self-pay

## 2021-01-20 VITALS — BP 121/80 | HR 91 | Resp 16 | Ht 73.0 in | Wt 206.0 lb

## 2021-01-20 DIAGNOSIS — R42 Dizziness and giddiness: Secondary | ICD-10-CM | POA: Diagnosis not present

## 2021-01-20 DIAGNOSIS — I10 Essential (primary) hypertension: Secondary | ICD-10-CM | POA: Diagnosis not present

## 2021-01-20 DIAGNOSIS — I63239 Cerebral infarction due to unspecified occlusion or stenosis of unspecified carotid arteries: Secondary | ICD-10-CM | POA: Diagnosis not present

## 2021-01-20 DIAGNOSIS — E785 Hyperlipidemia, unspecified: Secondary | ICD-10-CM

## 2021-01-20 DIAGNOSIS — H903 Sensorineural hearing loss, bilateral: Secondary | ICD-10-CM | POA: Diagnosis not present

## 2021-01-20 DIAGNOSIS — N1831 Chronic kidney disease, stage 3a: Secondary | ICD-10-CM

## 2021-01-20 HISTORY — DX: Cerebral infarction due to unspecified occlusion or stenosis of unspecified carotid artery: I63.239

## 2021-01-20 NOTE — Progress Notes (Signed)
[name] : [id number]  Bobby Murillo is a 80 y.o. (09-Nov-1940) male who presents with chief complaint of recent stroke.  History of Present Illness:   The patient is seen for evaluation of carotid stenosis. The carotid stenosis was identified after a CVA on 01/03/2021.  The patient was seen in the ER after he had 2 episode of slurred speech, first episode happened at about 2 PM, which lasted for about 2-3 minutes, then resolved spontaneously.  Described as coherent speaking and word garbage.  Second episode happened approximately 15-30 minutes later.  Does not have unilateral numbness or tinglings in extremities.  No facial droop or slurred speech note in the ER.  MRI/MRA showed an acute left sided infarct and diffuse small vessel disease .  The patient denies amaurosis fugax. There is no recent history of TIA symptoms or focal motor deficits. There is no prior documented CVA.  There is no history of migraine headaches. There is no history of seizures.  The patient is taking enteric-coated aspirin 81 mg daily.  The patient has a history of coronary artery disease, no recent episodes of angina or shortness of breath. The patient denies PAD or claudication symptoms. There is a history of hyperlipidemia which is being treated with a statin.    Current Meds  Medication Sig   acetaminophen (TYLENOL) 325 MG tablet Take 650 mg by mouth every 6 (six) hours as needed.   ALPRAZolam (XANAX) 0.5 MG tablet TAKE 1 TABLET BY MOUTH AT BEDTIME AS NEEDED FOR SLEEP   Ascorbic Acid (VITAMIN C PO) Take by mouth.   aspirin EC 81 MG EC tablet Take 1 tablet (81 mg total) by mouth daily. Swallow whole.   atorvastatin (LIPITOR) 20 MG tablet Take 1 tablet (20 mg total) by mouth 2 (two) times a week.   clopidogrel (PLAVIX) 75 MG tablet Take 1 tablet (75 mg total) by mouth daily.   Cyanocobalamin (B-12 PO) Take by mouth.   ezetimibe (ZETIA) 10 MG tablet Take 1 tablet by mouth once daily   fluticasone (FLONASE) 50  MCG/ACT nasal spray Place 2 sprays into both nostrils daily.   Glucosamine-Chondroitin (GLUCOSAMINE CHONDR COMPLEX PO) Take 1 tablet by mouth daily. tab by mouth daily   losartan (COZAAR) 50 MG tablet Take 1 tablet by mouth once daily   polycarbophil (FIBERCON) 625 MG tablet Take 625 mg by mouth daily. Reported on 05/29/2015   TURMERIC PO Take by mouth.    Past Medical History:  Diagnosis Date   Abnormal LFTs    Anxiety    Basal cell carcinoma 09/14/2010   left calf    Hyperlipidemia    prostate cancer 03/16/2008   Dr. Ronny Bacon   Sinus bradycardia by electrocardiogram    Stroke [facility])     Past Surgical History:  Procedure Laterality Date   PROSTATECTOMY  11/2008   transurethral, radical, Dr. Darcus Austin    Social History Social History   Tobacco Use   Smoking status: Never   Smokeless tobacco: Never  Vaping Use   Vaping Use: Never used  Substance Use Topics   Alcohol use: Yes    Alcohol/week: 8.0 standard drinks    Types: 4 Glasses of wine, 4 Shots of liquor per week    Comment: occasional   Drug use: No    Family History Family History  Problem Relation Age of Onset   Alzheimer's disease Mother    Stroke Father    Prostate cancer Father    Alzheimer's disease Sister  Stroke Paternal Aunt    Heart disease Paternal Uncle 57    Allergies  Allergen Reactions   Alfuzosin Other (See Comments)    Other Reaction: fainting spell   Pravastatin Sodium     Other reaction(s): Muscle Pain     REVIEW OF SYSTEMS (Negative unless checked)  Constitutional: [] Weight loss  [] Fever  [] Chills Cardiac: [] Chest pain   [] Chest pressure   [] Palpitations   [] Shortness of breath when laying flat   [] Shortness of breath with exertion. Vascular:  [] Pain in legs with walking   [] Pain in legs at rest  [] History of DVT   [] Phlebitis   [] Swelling in legs   [] Varicose veins   [] Non-healing ulcers Pulmonary:   [] Uses home oxygen   [] Productive cough   [] Hemoptysis   [] Wheeze  [] COPD    [] Asthma Neurologic:  [] Dizziness   [] Seizures   [x] History of stroke   [] History of TIA  [] Aphasia   [] Vissual changes   [] Weakness or numbness in arm   [] Weakness or numbness in leg Musculoskeletal:   [] Joint swelling   [x] Joint pain   [] Low back pain Hematologic:  [] Easy bruising  [] Easy bleeding   [] Hypercoagulable state   [] Anemic Gastrointestinal:  [] Diarrhea   [] Vomiting  [] Gastroesophageal reflux/heartburn   [] Difficulty swallowing. Genitourinary:  [x] Chronic kidney disease   [] Difficult urination  [] Frequent urination   [] Blood in urine Skin:  [] Rashes   [] Ulcers  Psychological:  [] History of anxiety   []  History of major depression.  Physical Examination  Vitals:   01/20/21 1319  BP: 121/80  Pulse: 91  Resp: 16  Weight: 206 lb (93.4 kg)  Height: 6\' 1"  (1.854 m)   Body mass index is 27.18 kg/m. Gen: WD/WN, NAD Head: [place]/AT, No temporalis wasting.  Ear/Nose/Throat: Hearing grossly intact, nares w/o erythema or drainage Eyes: PER, EOMI, sclera nonicteric.  Neck: Supple, no masses.  No bruit or JVD.  Pulmonary:  Good air movement, no audible wheezing, no use of accessory muscles.  Cardiac: RRR, normal S1, S2, no Murmurs. Vascular:   no carotid bruits noted Vessel Right Left  Radial Palpable Palpable  Carotid Palpable Palpable  Gastrointestinal: soft, non-distended. No guarding/no peritoneal signs.  Musculoskeletal: M/S 5/5 throughout.  No visible deformity.  Neurologic: CN 2-12 intact. Pain and light touch intact in extremities.  Symmetrical.  Speech is fluent. Motor exam as listed above. Psychiatric: Judgment intact, Mood & affect appropriate for pt's clinical situation. Dermatologic: No rashes or ulcers noted.  No changes consistent with cellulitis.   CBC Lab Results  Component Value Date   WBC 6.8 01/08/2021   HGB 16.7 01/08/2021   HCT 50.4 01/08/2021   MCV 93.6 01/08/2021   PLT 221.0 01/08/2021    BMET    Component Value Date/Time   NA 139 01/08/2021 1222    NA 140 10/23/2011 1142   K 4.3 01/08/2021 1222   CL 106 01/08/2021 1222   CO2 24 01/08/2021 1222   GLUCOSE 78 01/08/2021 1222   BUN 21 01/08/2021 1222   BUN 20 10/23/2011 1142   CREATININE 1.17 01/08/2021 1222   CREATININE 1.26 (H) 12/22/2019 1508   CALCIUM 9.4 01/08/2021 1222   GFRNONAA 59 (L) 01/03/2021 1500   GFRAA 69 10/23/2011 1142   Estimated Creatinine Clearance: 58.8 mL/min (by C-G formula based on SCr of 1.17 mg/dL).  COAG Lab Results  Component Value Date   INR 1.0 01/03/2021    Radiology CT HEAD WO CONTRAST  Result Date: 01/03/2021 CLINICAL DATA:  Neuro deficit,  acute, stroke suspected. sudden onset word garbage , had 2 episodes lasting 2-3 minutes apart, 1st around 1400 and 2nd around 1415. Speech is clear at present, pt denies any numbness, weakness or other sx EXAM: CT HEAD WITHOUT CONTRAST TECHNIQUE: Contiguous axial images were obtained from the base of the skull through the vertex without intravenous contrast. COMPARISON:  MR head 05/06/2020 BRAIN: BRAIN Patchy and confluent areas of decreased attenuation are noted throughout the deep and periventricular white matter of the cerebral hemispheres bilaterally, compatible with chronic microvascular ischemic disease. Limited evaluation of the temporal lobe, occipital lobes, cerebellum due to streak artifact. No evidence of large-territorial acute infarction. No parenchymal hemorrhage. No mass lesion. No extra-axial collection. No mass effect or midline shift. No hydrocephalus. Basilar cisterns are patent. Vascular: No hyperdense vessel. Skull: No acute fracture or focal lesion. Sinuses/Orbits: Paranasal sinuses and mastoid air cells are clear. The orbits are unremarkable. Other: None. IMPRESSION: No acute intracranial abnormality in a patient with chronic microvascular ischemic changes. Consider MRI head noncontrast for further evaluation. Electronically Signed   By: Iven Finn M.D.   On: 01/03/2021 15:43   MR ANGIO  HEAD WO CONTRAST  Result Date: 01/03/2021 CLINICAL DATA:  Transient ischemic attack. Neuro deficit, acute, stroke suspected. Speech disturbance. EXAM: MRI HEAD WITHOUT CONTRAST MRA HEAD WITHOUT CONTRAST TECHNIQUE: Multiplanar, multi-echo pulse sequences of the brain and surrounding structures were acquired without intravenous contrast. Angiographic images of the Circle of Willis were acquired using MRA technique without intravenous contrast. COMPARISON:  Head CT same day FINDINGS: MRI HEAD FINDINGS Brain: Diffusion imaging shows a few punctate foci of acute cortical infarction in the left posterior frontal vertex region. These are consistent with micro embolic infarctions in the left MCA territory. Possible minimal punctate white matter infarction posterior to the atrium of the left lateral ventricle, possibly incidental. The brainstem and cerebellum appear normal. Cerebral hemispheres otherwise show chronic small-vessel ischemic changes of the white matter. No mass, hemorrhage, hydrocephalus or extra-axial collection. Vascular: Major vessels at the base of the brain show flow. Skull and upper cervical spine: Negative Sinuses/Orbits: Clear/normal Other: None MRA HEAD FINDINGS Anterior circulation: Both internal carotid arteries are widely patent through the skull base and siphon regions. The anterior and middle cerebral vessels are patent without evidence of large vessel occlusion. There is atherosclerotic narrowing and irregularity of the more distal branch vessels. Focal occlusion is present within the right M2 inferior division. Posterior circulation: Both vertebral arteries are patent to the basilar. No basilar stenosis. There is considerable atherosclerotic disease affecting the PCA branches, more extensive on the left than the right. Severe stenosis present on both sides. Anatomic variants: None significant IMPRESSION: Few small foci of acute infarction affecting the cortex to the brain in the left  posterior frontal vertex. No hemorrhage or mass effect. Extensive chronic small-vessel ischemic changes elsewhere throughout the cerebral hemispheric white matter. Question incidental punctate acute white matter infarction just posterior to the atrium of the left lateral ventricle. No intracranial large vessel occlusion. Atherosclerotic narrowing and irregularity of the more distal MCA branches. Focal stenosis of the right M2 branch inferior division. Severe atherosclerotic disease of both PCAs with severe stenoses. Electronically Signed   By: Nelson Chimes M.D.   On: 01/03/2021 18:01   MR BRAIN WO CONTRAST  Result Date: 01/03/2021 CLINICAL DATA:  Transient ischemic attack. Neuro deficit, acute, stroke suspected. Speech disturbance. EXAM: MRI HEAD WITHOUT CONTRAST MRA HEAD WITHOUT CONTRAST TECHNIQUE: Multiplanar, multi-echo pulse sequences of the brain and surrounding structures  were acquired without intravenous contrast. Angiographic images of the Circle of Willis were acquired using MRA technique without intravenous contrast. COMPARISON:  Head CT same day FINDINGS: MRI HEAD FINDINGS Brain: Diffusion imaging shows a few punctate foci of acute cortical infarction in the left posterior frontal vertex region. These are consistent with micro embolic infarctions in the left MCA territory. Possible minimal punctate white matter infarction posterior to the atrium of the left lateral ventricle, possibly incidental. The brainstem and cerebellum appear normal. Cerebral hemispheres otherwise show chronic small-vessel ischemic changes of the white matter. No mass, hemorrhage, hydrocephalus or extra-axial collection. Vascular: Major vessels at the base of the brain show flow. Skull and upper cervical spine: Negative Sinuses/Orbits: Clear/normal Other: None MRA HEAD FINDINGS Anterior circulation: Both internal carotid arteries are widely patent through the skull base and siphon regions. The anterior and middle cerebral  vessels are patent without evidence of large vessel occlusion. There is atherosclerotic narrowing and irregularity of the more distal branch vessels. Focal occlusion is present within the right M2 inferior division. Posterior circulation: Both vertebral arteries are patent to the basilar. No basilar stenosis. There is considerable atherosclerotic disease affecting the PCA branches, more extensive on the left than the right. Severe stenosis present on both sides. Anatomic variants: None significant IMPRESSION: Few small foci of acute infarction affecting the cortex to the brain in the left posterior frontal vertex. No hemorrhage or mass effect. Extensive chronic small-vessel ischemic changes elsewhere throughout the cerebral hemispheric white matter. Question incidental punctate acute white matter infarction just posterior to the atrium of the left lateral ventricle. No intracranial large vessel occlusion. Atherosclerotic narrowing and irregularity of the more distal MCA branches. Focal stenosis of the right M2 branch inferior division. Severe atherosclerotic disease of both PCAs with severe stenoses. Electronically Signed   By: Nelson Chimes M.D.   On: 01/03/2021 18:01   US Carotid Bilateral (at [facility] and [facility] only)  Result Date: 01/03/2021 CLINICAL DATA:  TIA symptoms, garbled speech, hyperlipidemia EXAM: BILATERAL CAROTID DUPLEX ULTRASOUND TECHNIQUE: Pearline Cables scale imaging, color Doppler and duplex ultrasound were performed of bilateral carotid and vertebral arteries in the neck. COMPARISON:  None. FINDINGS: Criteria: Quantification of carotid stenosis is based on velocity parameters that correlate the residual internal carotid diameter with NASCET-based stenosis levels, using the diameter of the distal internal carotid lumen as the denominator for stenosis measurement. The following velocity measurements were obtained: RIGHT ICA: 85/26 cm/sec CCA: 106/26 cm/sec SYSTOLIC ICA/CCA RATIO:  0.8 ECA: 104 cm/sec LEFT ICA:  273/101 cm/sec CCA: 94/85 cm/sec SYSTOLIC ICA/CCA RATIO:  4.1 ECA: 145 cm/sec RIGHT CAROTID ARTERY: No significant atheromatous plaque. RIGHT VERTEBRAL ARTERY:  Normal antegrade flow. LEFT CAROTID ARTERY: There is significant nonshadowing likely noncalcified plaque at the origin of the left ICA. Significant spectral broadening within the left ICA waveform. LEFT VERTEBRAL ARTERY:  Normal antegrade flow. IMPRESSION: 1. Estimated 70-99% stenosis within the left ICA, based on grayscale imaging and Doppler interrogation. Significant plaque within the proximal left ICA on grayscale imaging without evidence of calcification or posterior shadowing. Follow-up CTA may be useful for further evaluation. 2. Estimated 0-49% stenosis within the right ICA. 3. Antegrade flow within the bilateral vertebral arteries. Electronically Signed   By: Randa Ngo M.D.   On: 01/03/2021 20:56   ECHOCARDIOGRAM COMPLETE  Result Date: 01/04/2021    ECHOCARDIOGRAM REPORT   Patient Name:   Bobby Murillo Date of Exam: 01/04/2021 Medical Rec #:  [id number]      Height:  73.0 in Accession #:    [id number]     Weight:       200.0 lb Date of Birth:  [birth year]       BSA:          2.152 m Patient Age:    77 years       BP:           129/60 mmHg Patient Gender: M              HR:           86 bpm. Exam Location:  [facility] Procedure: 2D Echo Indications:     TIA  History:         Patient has no prior history of Echocardiogram examinations.                  Risk Factors:Hypertension, Dyslipidemia and CKD, CA.  Sonographer:     L Thornton-Maynard Referring Phys:  [id number] NIU Diagnosing Phys: Donnelly Angelica  Sonographer Comments: Patient is > 60 yrs old. No Saline study performed. IMPRESSIONS  1. Left ventricular ejection fraction, by estimation, is 60 to 65%. The left ventricle has normal function. The left ventricle has no regional wall motion abnormalities. Left ventricular diastolic parameters were normal.  2. Right ventricular systolic function is  normal. The right ventricular size is normal.  3. The mitral valve is normal in structure. No evidence of mitral valve regurgitation.  4. The aortic valve is normal in structure. Aortic valve regurgitation is not visualized. No aortic stenosis is present.  5. Possible left to right shunt seen by doppler. Conclusion(s)/Recommendation(s): Cannot exclude ASD/PFO. Consider transesophageal echocardiogram, if clinically indicated. FINDINGS  Left Ventricle: Left ventricular ejection fraction, by estimation, is 60 to 65%. The left ventricle has normal function. The left ventricle has no regional wall motion abnormalities. The left ventricular internal cavity size was normal in size. There is  no left ventricular hypertrophy. Left ventricular diastolic parameters were normal. Right Ventricle: The right ventricular size is normal. No increase in right ventricular wall thickness. Right ventricular systolic function is normal. Left Atrium: Left atrial size was normal in size. Right Atrium: Right atrial size was normal in size. Pericardium: There is no evidence of pericardial effusion. Mitral Valve: The mitral valve is normal in structure. No evidence of mitral valve regurgitation. Tricuspid Valve: The tricuspid valve is normal in structure. Tricuspid valve regurgitation is trivial. Aortic Valve: The aortic valve is normal in structure. Aortic valve regurgitation is not visualized. No aortic stenosis is present. Aortic valve mean gradient measures 3.5 mmHg. Aortic valve peak gradient measures 7.3 mmHg. Aortic valve area, by VTI measures 2.99 cm. Pulmonic Valve: The pulmonic valve was not well visualized. Pulmonic valve regurgitation is not visualized. Aorta: The aortic root is normal in size and structure. IAS/Shunts: Possible left to right shunt seen by doppler.  LEFT VENTRICLE PLAX 2D LVIDd:         5.00 cm     Diastology LVIDs:         3.20 cm     LV e' medial:    6.75 cm/s LV PW:         1.00 cm     LV E/e' medial:  10.4 LV  IVS:        0.70 cm     LV e' lateral:   7.73 cm/s LVOT diam:     2.10 cm     LV E/e' lateral: 9.1 LV SV:  74 LV SV Index:   34 LVOT Area:     3.46 cm  LV Volumes (MOD) LV vol d, MOD A2C: 42.0 ml LV vol d, MOD A4C: 73.6 ml LV vol s, MOD A2C: 23.0 ml LV vol s, MOD A4C: 25.0 ml LV SV MOD A2C:     19.0 ml LV SV MOD A4C:     73.6 ml LV SV MOD BP:      36.2 ml RIGHT VENTRICLE RV S prime:     17.00 cm/s TAPSE (M-mode): 2.5 cm LEFT ATRIUM           Index LA diam:      2.60 cm 1.21 cm/m LA Vol (A2C): 19.8 ml 9.20 ml/m LA Vol (A4C): 23.3 ml 10.83 ml/m  AORTIC VALVE AV Area (Vmax):    2.76 cm AV Area (Vmean):   2.84 cm AV Area (VTI):     2.99 cm AV Vmax:           135.50 cm/s AV Vmean:          88.800 cm/s AV VTI:            0.247 m AV Peak Grad:      7.3 mmHg AV Mean Grad:      3.5 mmHg LVOT Vmax:         108.00 cm/s LVOT Vmean:        72.800 cm/s LVOT VTI:          0.213 m LVOT/AV VTI ratio: 0.86  AORTA Ao Root diam: 3.70 cm MV E velocity: 70.30 cm/s  TRICUSPID VALVE MV A velocity: 85.70 cm/s  TR Peak grad:   24.2 mmHg MV E/A ratio:  0.82        TR Vmax:        246.00 cm/s                             SHUNTS                            Systemic VTI:  0.21 m                            Systemic Diam: 2.10 cm Donnelly Angelica Electronically signed by Donnelly Angelica Signature Date/Time: 01/04/2021/12:33:45 PM    Final      Assessment/Plan 1. Carotid stenosis, symptomatic, with infarction [facility]) The patient remains asymptomatic with respect to the carotid stenosis.  However, the patient has now progressed and has a lesion the is >70%.  Patient should undergo CT angiography of the carotid arteries to define the degree of stenosis of the internal carotid arteries bilaterally and the anatomic suitability for surgery vs. intervention.  If the patient does indeed need surgery cardiac clearance will be required, once cleared the patient will be scheduled for surgery.  The risks, benefits and alternative therapies were  reviewed in detail with the patient.  All questions were answered.  The patient agrees to proceed with imaging.  Continue antiplatelet therapy as prescribed. Continue management of CAD, HTN and Hyperlipidemia. Healthy heart diet, encouraged exercise at least 4 times per week.   - CT ANGIO NECK W OR WO CONTRAST; Future  2. Essential hypertension Continue antihypertensive medications as already ordered, these medications have been reviewed and there are no changes at this time.   3. Hyperlipidemia LDL goal <100  Continue statin as ordered and reviewed, no changes at this time   4. Stage 3a chronic kidney disease ([place]) At the present time the patient does not require dialysis  Avoid nephrotoxic medications and dehydration.  We will hydrate the patient prior to the CT scan  Further plans per nephrology     Hortencia Pilar, MD  01/20/2021 2:13 PM

## 2021-01-26 NOTE — Progress Notes (Signed)
NEUROLOGY CONSULTATION NOTE  Bobby Murillo [name]: [id number] DOB: [birth year]  Referring provider: Deborra Medina, MD Primary care provider: Deborra Medina, MD  Reason for consult:  TIA  Assessment/Plan:   Aphasia secondary to left hemispheric stroke due to left internal carotid artery stenosis Left carotid artery stenosis Hypertension Hyperlipidemia Atrial septal defect - suspect incidental finding - stroke likely due to the ICA stenosis  1.Continue ASA 81mg  and Plavix 75mg  daily for now 2 Follow up with vascular surgery - it appears that the CTA has not yet been scheduled.  Recommended that they call vascular surgery's office to let them know 3  Statin therapy.  LDL goal less than 70 4  Blood pressure control 5  Hgb A1c goal less than 7 6  Follow up 6 months.   Subjective:  Bobby Murillo is a 80 year old right-handed male with HTN, HLD, CKD, polycythemia, asymptomatic sinus bradycardia and history of prostate cancer who presents for strokes.  History supplemented by hospital records and referring provider's note.  Current medication:  ASA 81mg , Plavix 75mg , atorvastatin 20mg , Zetia, losartan  On 01/02/2021, he developed word-finding difficulty and nonsensical speech.  No facial droop, slurred speech or unilateral numbness or weakness.  Episode lasted 30 minutes.  He was admitted to the hospital for further evaluation.  MRI of brain showed few small foci of acute infarcts involving the cortex of the left posterior frontal vertex, as well as extensive chronic small vessel ischemic changes and questionable acute punctate infarct just posterior to the atrium of the left lateral ventricle.  MRA of head showed atherosclerotic narrowing and irregularity of the distal MCA branches, focal stenosis of the right M2 branch inferior division and severe atherosclerotic stenosis of both PCAs.  Carotid ultrasound showed 70-99% left ICA stenosis, 0-49% right ICA stenosis and antegrade flow of bilateral  VAs.  Echocardiogram showed EF 60-65% with possible left to right shunt.  The left ICA stenosis was evaluated by vascular surgery.  CTA was ordered.       PAST MEDICAL HISTORY: Past Medical History:  Diagnosis Date   Abnormal LFTs    Anxiety    Basal cell carcinoma 09/14/2010   left calf    Hyperlipidemia    prostate cancer 03/16/2008   Dr. Ronny Bacon   Sinus bradycardia by electrocardiogram    Stroke [facility])     PAST SURGICAL HISTORY: Past Surgical History:  Procedure Laterality Date   PROSTATECTOMY  11/2008   transurethral, radical, Dr. Darcus Austin    MEDICATIONS: Current Outpatient Medications on File Prior to Visit  Medication Sig Dispense Refill   acetaminophen (TYLENOL) 325 MG tablet Take 650 mg by mouth every 6 (six) hours as needed.     ALPRAZolam (XANAX) 0.5 MG tablet TAKE 1 TABLET BY MOUTH AT BEDTIME AS NEEDED FOR SLEEP 30 tablet 5   Ascorbic Acid (VITAMIN C PO) Take by mouth.     aspirin EC 81 MG EC tablet Take 1 tablet (81 mg total) by mouth daily. Swallow whole. 30 tablet 11   atorvastatin (LIPITOR) 20 MG tablet Take 1 tablet (20 mg total) by mouth 2 (two) times a week. 26 tablet 3   clopidogrel (PLAVIX) 75 MG tablet Take 1 tablet (75 mg total) by mouth daily. 90 tablet 1   Cyanocobalamin (B-12 PO) Take by mouth.     ezetimibe (ZETIA) 10 MG tablet Take 1 tablet by mouth once daily 90 tablet 0   fluticasone (FLONASE) 50 MCG/ACT nasal spray Place 2 sprays into  both nostrils daily. 16 g 6   Glucosamine-Chondroitin (GLUCOSAMINE CHONDR COMPLEX PO) Take 1 tablet by mouth daily. tab by mouth daily     losartan (COZAAR) 50 MG tablet Take 1 tablet by mouth once daily 90 tablet 1   polycarbophil (FIBERCON) 625 MG tablet Take 625 mg by mouth daily. Reported on 05/29/2015     TURMERIC PO Take by mouth.     No current facility-administered medications on file prior to visit.    ALLERGIES: Allergies  Allergen Reactions   Alfuzosin Other (See Comments)    Other Reaction: fainting spell    Pravastatin Sodium     Other reaction(s): Muscle Pain    FAMILY HISTORY: Family History  Problem Relation Age of Onset   Alzheimer's disease Mother    Stroke Father    Prostate cancer Father    Alzheimer's disease Sister    Stroke Paternal Aunt    Heart disease Paternal Uncle 4    Objective:  Blood pressure (!) 151/86, pulse 92, height 6\' 1"  (1.854 m), weight 207 lb 9.6 oz (94.2 kg), SpO2 97 %. General: No acute distress.  Patient appears well-groomed.   Head:  Normocephalic/atraumatic Eyes:  fundi examined but not visualized Neck: supple, no paraspinal tenderness, full range of motion Back: No paraspinal tenderness Heart: regular rate and rhythm Lungs: Clear to auscultation bilaterally. Vascular: No carotid bruits. Neurological Exam: Mental status: alert and oriented to person, place, and time, recent and remote memory intact, fund of knowledge intact, attention and concentration intact, speech fluent and not dysarthric, language intact. Cranial nerves: CN I: not tested CN II: pupils equal, round and reactive to light, visual fields intact CN III, IV, VI:  full range of motion, no nystagmus, no ptosis CN V: facial sensation intact. CN VII: upper and lower face symmetric CN VIII: hearing intact CN IX, X: gag intact, uvula midline CN XI: sternocleidomastoid and trapezius muscles intact CN XII: tongue midline Bulk & Tone: normal, no fasciculations. Motor:  muscle strength 5/5 throughout Sensation:  Pinprick, temperature and vibratory sensation intact. Deep Tendon Reflexes:  2+ throughout,  toes downgoing.   Finger to nose testing:  Without dysmetria.   Heel to shin:  Without dysmetria.   Gait:  Normal station and stride.  Romberg negative.    Thank you for allowing me to take part in the care of this patient.  Metta Clines, DO  CC: Deborra Medina, MD

## 2021-01-27 ENCOUNTER — Other Ambulatory Visit: Payer: Self-pay

## 2021-01-27 ENCOUNTER — Telehealth (INDEPENDENT_AMBULATORY_CARE_PROVIDER_SITE_OTHER): Payer: Self-pay

## 2021-01-27 ENCOUNTER — Ambulatory Visit: Payer: Medicare Other | Admitting: Neurology

## 2021-01-27 ENCOUNTER — Other Ambulatory Visit (INDEPENDENT_AMBULATORY_CARE_PROVIDER_SITE_OTHER): Payer: Self-pay | Admitting: Nurse Practitioner

## 2021-01-27 ENCOUNTER — Encounter: Payer: Self-pay | Admitting: Neurology

## 2021-01-27 VITALS — BP 151/86 | HR 92 | Ht 73.0 in | Wt 207.6 lb

## 2021-01-27 DIAGNOSIS — I6522 Occlusion and stenosis of left carotid artery: Secondary | ICD-10-CM | POA: Diagnosis not present

## 2021-01-27 DIAGNOSIS — I63232 Cerebral infarction due to unspecified occlusion or stenosis of left carotid arteries: Secondary | ICD-10-CM | POA: Diagnosis not present

## 2021-01-27 DIAGNOSIS — Q211 Atrial septal defect, unspecified: Secondary | ICD-10-CM

## 2021-01-27 DIAGNOSIS — I10 Essential (primary) hypertension: Secondary | ICD-10-CM | POA: Diagnosis not present

## 2021-01-27 DIAGNOSIS — E785 Hyperlipidemia, unspecified: Secondary | ICD-10-CM

## 2021-01-27 MED ORDER — SODIUM CHLORIDE 0.9 % IV SOLN: INTRAVENOUS | Status: AC

## 2021-01-27 MED ORDER — SODIUM CHLORIDE 0.9 % IV SOLN: Freq: Once | INTRAVENOUS | Status: DC

## 2021-01-27 NOTE — Patient Instructions (Addendum)
Contact Dr. Grace Isaac office to let them know that you were never contacted to schedule the CT of the carotid artery Continue atorvastatin Follow up 6 months.

## 2021-01-27 NOTE — Telephone Encounter (Signed)
Patient called wanting to know if his having seen Dr. Tomi Likens 01/27/21 will keep him from having to be seen in office with Dr. Delana Meyer. I explained that he was being seen in our office for a CT result appt. Patient has not had a CT. Patient was advised that once his CT has been scheduled to contact our office to make an appt if needed other than the 01/30/21 appt.

## 2021-01-27 NOTE — Telephone Encounter (Signed)
Patient was made aware to arrive at [facility] same day surgery at 8 am for pre-hydration on 01/30/21 for CT

## 2021-01-29 NOTE — H&P (View-Only) (Signed)
[name] : [id number]  Bobby Murillo is a 80 y.o. (01/08/1941) male who presents with chief complaint of talk about the CT of the carotid arteries.  History of Present Illness:   The patient is seen for follow up evaluation of carotid stenosis status post CT angiogram. CT scan was done 01/30/2021. Patient reports that the test went well with no problems or complications.   The patient denies interval amaurosis fugax. There is no recent or interval TIA symptoms or focal motor deficits. There is no prior documented CVA.  The patient is taking enteric-coated aspirin 81 mg daily.  There is no history of migraine headaches. There is no history of seizures.  The patient has a history of coronary artery disease, no recent episodes of angina or shortness of breath. The patient denies PAD or claudication symptoms. There is a history of hyperlipidemia which is being treated with a statin.    CT angiogram is reviewed by me personally and shows 95% stenosis consistent with calcified plaque at the origin of the left internal carotid artery.    No outpatient medications have been marked as taking for the 01/30/21 encounter (Appointment) with Delana Meyer, Dolores Lory, MD.    Past Medical History:  Diagnosis Date   Abnormal LFTs    Anxiety    Basal cell carcinoma 09/14/2010   left calf    Hyperlipidemia    prostate cancer 03/16/2008   Dr. Ronny Bacon   Sinus bradycardia by electrocardiogram    Stroke [facility])     Past Surgical History:  Procedure Laterality Date   PROSTATECTOMY  11/2008   transurethral, radical, Dr. Darcus Austin    Social History Social History   Tobacco Use   Smoking status: Never   Smokeless tobacco: Never  Vaping Use   Vaping Use: Never used  Substance Use Topics   Alcohol use: Yes    Alcohol/week: 8.0 standard drinks    Types: 4 Glasses of wine, 4 Shots of liquor per week    Comment: occasional   Drug use: No    Family History Family History  Problem Relation Age of Onset    Alzheimer's disease Mother    Stroke Father    Prostate cancer Father    Alzheimer's disease Sister    Stroke Paternal Aunt    Heart disease Paternal Uncle 47    Allergies  Allergen Reactions   Alfuzosin Other (See Comments)    Other Reaction: fainting spell   Pravastatin Sodium     Other reaction(s): Muscle Pain     REVIEW OF SYSTEMS (Negative unless checked)  Constitutional: [] Weight loss  [] Fever  [] Chills Cardiac: [] Chest pain   [] Chest pressure   [] Palpitations   [] Shortness of breath when laying flat   [] Shortness of breath with exertion. Vascular:  [] Pain in legs with walking   [] Pain in legs at rest  [] History of DVT   [] Phlebitis   [] Swelling in legs   [] Varicose veins   [] Non-healing ulcers Pulmonary:   [] Uses home oxygen   [] Productive cough   [] Hemoptysis   [] Wheeze  [] COPD   [] Asthma Neurologic:  [] Dizziness   [] Seizures   [] History of stroke   [] History of TIA  [] Aphasia   [] Vissual changes   [] Weakness or numbness in arm   [] Weakness or numbness in leg Musculoskeletal:   [] Joint swelling   [] Joint pain   [] Low back pain Hematologic:  [] Easy bruising  [] Easy bleeding   [] Hypercoagulable state   [] Anemic Gastrointestinal:  [] Diarrhea   [] Vomiting  []   Gastroesophageal reflux/heartburn   [] Difficulty swallowing. Genitourinary:  [] Chronic kidney disease   [] Difficult urination  [] Frequent urination   [] Blood in urine Skin:  [] Rashes   [] Ulcers  Psychological:  [] History of anxiety   []  History of major depression.  Physical Examination  There were no vitals filed for this visit. There is no height or weight on file to calculate BMI. Gen: WD/WN, NAD Head: [place]/AT, No temporalis wasting.  Ear/Nose/Throat: Hearing grossly intact, nares w/o erythema or drainage Eyes: PER, EOMI, sclera nonicteric.  Neck: Supple, no masses.  No bruit or JVD.  Pulmonary:  Good air movement, no audible wheezing, no use of accessory muscles.  Cardiac: RRR, normal S1, S2, no Murmurs. Vascular:   no left bruit note Vessel Right Left  Radial Palpable Palpable  Carotid Palpable Palpable  Gastrointestinal: soft, non-distended. No guarding/no peritoneal signs.  Musculoskeletal: M/S 5/5 throughout.  No visible deformity.  Neurologic: CN 2-12 intact. Pain and light touch intact in extremities.  Symmetrical.  Speech is fluent. Motor exam as listed above. Psychiatric: Judgment intact, Mood & affect appropriate for pt's clinical situation. Dermatologic: No rashes or ulcers noted.  No changes consistent with cellulitis.   CBC Lab Results  Component Value Date   WBC 6.8 01/08/2021   HGB 16.7 01/08/2021   HCT 50.4 01/08/2021   MCV 93.6 01/08/2021   PLT 221.0 01/08/2021    BMET    Component Value Date/Time   NA 139 01/08/2021 1222   NA 140 10/23/2011 1142   K 4.3 01/08/2021 1222   CL 106 01/08/2021 1222   CO2 24 01/08/2021 1222   GLUCOSE 78 01/08/2021 1222   BUN 21 01/08/2021 1222   BUN 20 10/23/2011 1142   CREATININE 1.17 01/08/2021 1222   CREATININE 1.26 (H) 12/22/2019 1508   CALCIUM 9.4 01/08/2021 1222   GFRNONAA 59 (L) 01/03/2021 1500   GFRAA 69 10/23/2011 1142   CrCl cannot be calculated (Patient's most recent lab result is older than the maximum 21 days allowed.).  COAG Lab Results  Component Value Date   INR 1.0 01/03/2021    Radiology CT HEAD WO CONTRAST  Result Date: 01/03/2021 CLINICAL DATA:  Neuro deficit, acute, stroke suspected. sudden onset word garbage , had 2 episodes lasting 2-3 minutes apart, 1st around 1400 and 2nd around 1415. Speech is clear at present, pt denies any numbness, weakness or other sx EXAM: CT HEAD WITHOUT CONTRAST TECHNIQUE: Contiguous axial images were obtained from the base of the skull through the vertex without intravenous contrast. COMPARISON:  MR head 05/06/2020 BRAIN: BRAIN Patchy and confluent areas of decreased attenuation are noted throughout the deep and periventricular white matter of the cerebral hemispheres bilaterally,  compatible with chronic microvascular ischemic disease. Limited evaluation of the temporal lobe, occipital lobes, cerebellum due to streak artifact. No evidence of large-territorial acute infarction. No parenchymal hemorrhage. No mass lesion. No extra-axial collection. No mass effect or midline shift. No hydrocephalus. Basilar cisterns are patent. Vascular: No hyperdense vessel. Skull: No acute fracture or focal lesion. Sinuses/Orbits: Paranasal sinuses and mastoid air cells are clear. The orbits are unremarkable. Other: None. IMPRESSION: No acute intracranial abnormality in a patient with chronic microvascular ischemic changes. Consider MRI head noncontrast for further evaluation. Electronically Signed   By: Iven Finn M.D.   On: 01/03/2021 15:43   MR ANGIO HEAD WO CONTRAST  Result Date: 01/03/2021 CLINICAL DATA:  Transient ischemic attack. Neuro deficit, acute, stroke suspected. Speech disturbance. EXAM: MRI HEAD WITHOUT CONTRAST MRA HEAD WITHOUT CONTRAST  TECHNIQUE: Multiplanar, multi-echo pulse sequences of the brain and surrounding structures were acquired without intravenous contrast. Angiographic images of the Circle of Willis were acquired using MRA technique without intravenous contrast. COMPARISON:  Head CT same day FINDINGS: MRI HEAD FINDINGS Brain: Diffusion imaging shows a few punctate foci of acute cortical infarction in the left posterior frontal vertex region. These are consistent with micro embolic infarctions in the left MCA territory. Possible minimal punctate white matter infarction posterior to the atrium of the left lateral ventricle, possibly incidental. The brainstem and cerebellum appear normal. Cerebral hemispheres otherwise show chronic small-vessel ischemic changes of the white matter. No mass, hemorrhage, hydrocephalus or extra-axial collection. Vascular: Major vessels at the base of the brain show flow. Skull and upper cervical spine: Negative Sinuses/Orbits: Clear/normal  Other: None MRA HEAD FINDINGS Anterior circulation: Both internal carotid arteries are widely patent through the skull base and siphon regions. The anterior and middle cerebral vessels are patent without evidence of large vessel occlusion. There is atherosclerotic narrowing and irregularity of the more distal branch vessels. Focal occlusion is present within the right M2 inferior division. Posterior circulation: Both vertebral arteries are patent to the basilar. No basilar stenosis. There is considerable atherosclerotic disease affecting the PCA branches, more extensive on the left than the right. Severe stenosis present on both sides. Anatomic variants: None significant IMPRESSION: Few small foci of acute infarction affecting the cortex to the brain in the left posterior frontal vertex. No hemorrhage or mass effect. Extensive chronic small-vessel ischemic changes elsewhere throughout the cerebral hemispheric white matter. Question incidental punctate acute white matter infarction just posterior to the atrium of the left lateral ventricle. No intracranial large vessel occlusion. Atherosclerotic narrowing and irregularity of the more distal MCA branches. Focal stenosis of the right M2 branch inferior division. Severe atherosclerotic disease of both PCAs with severe stenoses. Electronically Signed   By: Nelson Chimes M.D.   On: 01/03/2021 18:01   MR BRAIN WO CONTRAST  Result Date: 01/03/2021 CLINICAL DATA:  Transient ischemic attack. Neuro deficit, acute, stroke suspected. Speech disturbance. EXAM: MRI HEAD WITHOUT CONTRAST MRA HEAD WITHOUT CONTRAST TECHNIQUE: Multiplanar, multi-echo pulse sequences of the brain and surrounding structures were acquired without intravenous contrast. Angiographic images of the Circle of Willis were acquired using MRA technique without intravenous contrast. COMPARISON:  Head CT same day FINDINGS: MRI HEAD FINDINGS Brain: Diffusion imaging shows a few punctate foci of acute cortical  infarction in the left posterior frontal vertex region. These are consistent with micro embolic infarctions in the left MCA territory. Possible minimal punctate white matter infarction posterior to the atrium of the left lateral ventricle, possibly incidental. The brainstem and cerebellum appear normal. Cerebral hemispheres otherwise show chronic small-vessel ischemic changes of the white matter. No mass, hemorrhage, hydrocephalus or extra-axial collection. Vascular: Major vessels at the base of the brain show flow. Skull and upper cervical spine: Negative Sinuses/Orbits: Clear/normal Other: None MRA HEAD FINDINGS Anterior circulation: Both internal carotid arteries are widely patent through the skull base and siphon regions. The anterior and middle cerebral vessels are patent without evidence of large vessel occlusion. There is atherosclerotic narrowing and irregularity of the more distal branch vessels. Focal occlusion is present within the right M2 inferior division. Posterior circulation: Both vertebral arteries are patent to the basilar. No basilar stenosis. There is considerable atherosclerotic disease affecting the PCA branches, more extensive on the left than the right. Severe stenosis present on both sides. Anatomic variants: None significant IMPRESSION: Few small foci of acute  infarction affecting the cortex to the brain in the left posterior frontal vertex. No hemorrhage or mass effect. Extensive chronic small-vessel ischemic changes elsewhere throughout the cerebral hemispheric white matter. Question incidental punctate acute white matter infarction just posterior to the atrium of the left lateral ventricle. No intracranial large vessel occlusion. Atherosclerotic narrowing and irregularity of the more distal MCA branches. Focal stenosis of the right M2 branch inferior division. Severe atherosclerotic disease of both PCAs with severe stenoses. Electronically Signed   By: Nelson Chimes M.D.   On: 01/03/2021  18:01   US Carotid Bilateral (at [facility] and [facility] only)  Result Date: 01/03/2021 CLINICAL DATA:  TIA symptoms, garbled speech, hyperlipidemia EXAM: BILATERAL CAROTID DUPLEX ULTRASOUND TECHNIQUE: Pearline Cables scale imaging, color Doppler and duplex ultrasound were performed of bilateral carotid and vertebral arteries in the neck. COMPARISON:  None. FINDINGS: Criteria: Quantification of carotid stenosis is based on velocity parameters that correlate the residual internal carotid diameter with NASCET-based stenosis levels, using the diameter of the distal internal carotid lumen as the denominator for stenosis measurement. The following velocity measurements were obtained: RIGHT ICA: 85/26 cm/sec CCA: 417/40 cm/sec SYSTOLIC ICA/CCA RATIO:  0.8 ECA: 104 cm/sec LEFT ICA: 273/101 cm/sec CCA: 81/44 cm/sec SYSTOLIC ICA/CCA RATIO:  4.1 ECA: 145 cm/sec RIGHT CAROTID ARTERY: No significant atheromatous plaque. RIGHT VERTEBRAL ARTERY:  Normal antegrade flow. LEFT CAROTID ARTERY: There is significant nonshadowing likely noncalcified plaque at the origin of the left ICA. Significant spectral broadening within the left ICA waveform. LEFT VERTEBRAL ARTERY:  Normal antegrade flow. IMPRESSION: 1. Estimated 70-99% stenosis within the left ICA, based on grayscale imaging and Doppler interrogation. Significant plaque within the proximal left ICA on grayscale imaging without evidence of calcification or posterior shadowing. Follow-up CTA may be useful for further evaluation. 2. Estimated 0-49% stenosis within the right ICA. 3. Antegrade flow within the bilateral vertebral arteries. Electronically Signed   By: Randa Ngo M.D.   On: 01/03/2021 20:56   ECHOCARDIOGRAM COMPLETE  Result Date: 01/04/2021    ECHOCARDIOGRAM REPORT   Patient Name:   Bobby Murillo Date of Exam: 01/04/2021 Medical Rec #:  [id number]      Height:       73.0 in Accession #:    [id number]     Weight:       200.0 lb Date of Birth:  [birth year]       BSA:          2.152 m  Patient Age:    6 years       BP:           129/60 mmHg Patient Gender: M              HR:           86 bpm. Exam Location:  [facility] Procedure: 2D Echo Indications:     TIA  History:         Patient has no prior history of Echocardiogram examinations.                  Risk Factors:Hypertension, Dyslipidemia and CKD, CA.  Sonographer:     L Thornton-Maynard Referring Phys:  [id number] XAJOI NIU Diagnosing Phys: Donnelly Angelica  Sonographer Comments: Patient is > 40 yrs old. No Saline study performed. IMPRESSIONS  1. Left ventricular ejection fraction, by estimation, is 60 to 65%. The left ventricle has normal function. The left ventricle has no regional wall motion abnormalities. Left ventricular diastolic parameters were normal.  2. Right  ventricular systolic function is normal. The right ventricular size is normal.  3. The mitral valve is normal in structure. No evidence of mitral valve regurgitation.  4. The aortic valve is normal in structure. Aortic valve regurgitation is not visualized. No aortic stenosis is present.  5. Possible left to right shunt seen by doppler. Conclusion(s)/Recommendation(s): Cannot exclude ASD/PFO. Consider transesophageal echocardiogram, if clinically indicated. FINDINGS  Left Ventricle: Left ventricular ejection fraction, by estimation, is 60 to 65%. The left ventricle has normal function. The left ventricle has no regional wall motion abnormalities. The left ventricular internal cavity size was normal in size. There is  no left ventricular hypertrophy. Left ventricular diastolic parameters were normal. Right Ventricle: The right ventricular size is normal. No increase in right ventricular wall thickness. Right ventricular systolic function is normal. Left Atrium: Left atrial size was normal in size. Right Atrium: Right atrial size was normal in size. Pericardium: There is no evidence of pericardial effusion. Mitral Valve: The mitral valve is normal in structure. No evidence of mitral valve  regurgitation. Tricuspid Valve: The tricuspid valve is normal in structure. Tricuspid valve regurgitation is trivial. Aortic Valve: The aortic valve is normal in structure. Aortic valve regurgitation is not visualized. No aortic stenosis is present. Aortic valve mean gradient measures 3.5 mmHg. Aortic valve peak gradient measures 7.3 mmHg. Aortic valve area, by VTI measures 2.99 cm. Pulmonic Valve: The pulmonic valve was not well visualized. Pulmonic valve regurgitation is not visualized. Aorta: The aortic root is normal in size and structure. IAS/Shunts: Possible left to right shunt seen by doppler.  LEFT VENTRICLE PLAX 2D LVIDd:         5.00 cm     Diastology LVIDs:         3.20 cm     LV e' medial:    6.75 cm/s LV PW:         1.00 cm     LV E/e' medial:  10.4 LV IVS:        0.70 cm     LV e' lateral:   7.73 cm/s LVOT diam:     2.10 cm     LV E/e' lateral: 9.1 LV SV:         74 LV SV Index:   34 LVOT Area:     3.46 cm  LV Volumes (MOD) LV vol d, MOD A2C: 42.0 ml LV vol d, MOD A4C: 73.6 ml LV vol s, MOD A2C: 23.0 ml LV vol s, MOD A4C: 25.0 ml LV SV MOD A2C:     19.0 ml LV SV MOD A4C:     73.6 ml LV SV MOD BP:      36.2 ml RIGHT VENTRICLE RV S prime:     17.00 cm/s TAPSE (M-mode): 2.5 cm LEFT ATRIUM           Index LA diam:      2.60 cm 1.21 cm/m LA Vol (A2C): 19.8 ml 9.20 ml/m LA Vol (A4C): 23.3 ml 10.83 ml/m  AORTIC VALVE AV Area (Vmax):    2.76 cm AV Area (Vmean):   2.84 cm AV Area (VTI):     2.99 cm AV Vmax:           135.50 cm/s AV Vmean:          88.800 cm/s AV VTI:            0.247 m AV Peak Grad:      7.3 mmHg AV Mean Grad:  3.5 mmHg LVOT Vmax:         108.00 cm/s LVOT Vmean:        72.800 cm/s LVOT VTI:          0.213 m LVOT/AV VTI ratio: 0.86  AORTA Ao Root diam: 3.70 cm MV E velocity: 70.30 cm/s  TRICUSPID VALVE MV A velocity: 85.70 cm/s  TR Peak grad:   24.2 mmHg MV E/A ratio:  0.82        TR Vmax:        246.00 cm/s                             SHUNTS                            Systemic VTI:   0.21 m                            Systemic Diam: 2.10 cm Donnelly Angelica Electronically signed by Donnelly Angelica Signature Date/Time: 01/04/2021/12:33:45 PM    Final      Assessment/Plan 1. Carotid stenosis, symptomatic, with infarction [facility]) Recommend:  The patient remains asymptomatic with respect to the carotid stenosis.  However, the patient has now progressed and has a lesion the is 95%.  Patient's CT angiography of the carotid arteries confirms 95% left ICA stenosis.  The anatomical considerations support surgery over stenting.  This was discussed in detail with the patient.  The patient does indeed need surgery, therefore, cardiac clearance will be arranged. Once cleared the patient will be scheduled for surgery.  The risks, benefits and alternative therapies were reviewed in detail with the patient.  All questions were answered.  The patient agrees to proceed with surgery of the left carotid artery.  Continue antiplatelet therapy as prescribed. Continue management of CAD, HTN and Hyperlipidemia. Healthy heart diet, encouraged exercise at least 4 times per week.   - Ambulatory referral to Cardiology  2. Essential hypertension Continue antihypertensive medications as already ordered, these medications have been reviewed and there are no changes at this time.  - Ambulatory referral to Cardiology  3. Abdominal aortic atherosclerosis (HCC) Recommend:  I do not find evidence of life style limiting vascular disease. The patient specifically denies life style limitation.  Previous noninvasive studies including ABI's of the legs do not identify critical vascular problems.  The patient should continue walking and begin a more formal exercise program. The patient should continue his antiplatelet therapy and aggressive treatment of the lipid abnormalities.  The patient should begin wearing graduated compression socks 15-20 mmHg strength to control her mild edema.  4. Hyperlipidemia LDL goal  <100 Continue statin as ordered and reviewed, no changes at this time     Hortencia Pilar, MD  01/29/2021 1:54 PM

## 2021-01-29 NOTE — Progress Notes (Signed)
[name] : [id number]  Bobby Murillo is a 80 y.o. (February 01, 1941) male who presents with chief complaint of talk about the CT of the carotid arteries.  History of Present Illness:   The patient is seen for follow up evaluation of carotid stenosis status post CT angiogram. CT scan was done 01/30/2021. Patient reports that the test went well with no problems or complications.   The patient denies interval amaurosis fugax. There is no recent or interval TIA symptoms or focal motor deficits. There is no prior documented CVA.  The patient is taking enteric-coated aspirin 81 mg daily.  There is no history of migraine headaches. There is no history of seizures.  The patient has a history of coronary artery disease, no recent episodes of angina or shortness of breath. The patient denies PAD or claudication symptoms. There is a history of hyperlipidemia which is being treated with a statin.    CT angiogram is reviewed by me personally and shows 95% stenosis consistent with calcified plaque at the origin of the left internal carotid artery.    No outpatient medications have been marked as taking for the 01/30/21 encounter (Appointment) with Delana Meyer, Dolores Lory, MD.    Past Medical History:  Diagnosis Date   Abnormal LFTs    Anxiety    Basal cell carcinoma 09/14/2010   left calf    Hyperlipidemia    prostate cancer 03/16/2008   Dr. Ronny Bacon   Sinus bradycardia by electrocardiogram    Stroke [facility])     Past Surgical History:  Procedure Laterality Date   PROSTATECTOMY  11/2008   transurethral, radical, Dr. Darcus Austin    Social History Social History   Tobacco Use   Smoking status: Never   Smokeless tobacco: Never  Vaping Use   Vaping Use: Never used  Substance Use Topics   Alcohol use: Yes    Alcohol/week: 8.0 standard drinks    Types: 4 Glasses of wine, 4 Shots of liquor per week    Comment: occasional   Drug use: No    Family History Family History  Problem Relation Age of Onset    Alzheimer's disease Mother    Stroke Father    Prostate cancer Father    Alzheimer's disease Sister    Stroke Paternal Aunt    Heart disease Paternal Uncle 79    Allergies  Allergen Reactions   Alfuzosin Other (See Comments)    Other Reaction: fainting spell   Pravastatin Sodium     Other reaction(s): Muscle Pain     REVIEW OF SYSTEMS (Negative unless checked)  Constitutional: [] Weight loss  [] Fever  [] Chills Cardiac: [] Chest pain   [] Chest pressure   [] Palpitations   [] Shortness of breath when laying flat   [] Shortness of breath with exertion. Vascular:  [] Pain in legs with walking   [] Pain in legs at rest  [] History of DVT   [] Phlebitis   [] Swelling in legs   [] Varicose veins   [] Non-healing ulcers Pulmonary:   [] Uses home oxygen   [] Productive cough   [] Hemoptysis   [] Wheeze  [] COPD   [] Asthma Neurologic:  [] Dizziness   [] Seizures   [] History of stroke   [] History of TIA  [] Aphasia   [] Vissual changes   [] Weakness or numbness in arm   [] Weakness or numbness in leg Musculoskeletal:   [] Joint swelling   [] Joint pain   [] Low back pain Hematologic:  [] Easy bruising  [] Easy bleeding   [] Hypercoagulable state   [] Anemic Gastrointestinal:  [] Diarrhea   [] Vomiting  []   Gastroesophageal reflux/heartburn   [] Difficulty swallowing. Genitourinary:  [] Chronic kidney disease   [] Difficult urination  [] Frequent urination   [] Blood in urine Skin:  [] Rashes   [] Ulcers  Psychological:  [] History of anxiety   []  History of major depression.  Physical Examination  There were no vitals filed for this visit. There is no height or weight on file to calculate BMI. Gen: WD/WN, NAD Head: [place]/AT, No temporalis wasting.  Ear/Nose/Throat: Hearing grossly intact, nares w/o erythema or drainage Eyes: PER, EOMI, sclera nonicteric.  Neck: Supple, no masses.  No bruit or JVD.  Pulmonary:  Good air movement, no audible wheezing, no use of accessory muscles.  Cardiac: RRR, normal S1, S2, no Murmurs. Vascular:   no left bruit note Vessel Right Left  Radial Palpable Palpable  Carotid Palpable Palpable  Gastrointestinal: soft, non-distended. No guarding/no peritoneal signs.  Musculoskeletal: M/S 5/5 throughout.  No visible deformity.  Neurologic: CN 2-12 intact. Pain and light touch intact in extremities.  Symmetrical.  Speech is fluent. Motor exam as listed above. Psychiatric: Judgment intact, Mood & affect appropriate for pt's clinical situation. Dermatologic: No rashes or ulcers noted.  No changes consistent with cellulitis.   CBC Lab Results  Component Value Date   WBC 6.8 01/08/2021   HGB 16.7 01/08/2021   HCT 50.4 01/08/2021   MCV 93.6 01/08/2021   PLT 221.0 01/08/2021    BMET    Component Value Date/Time   NA 139 01/08/2021 1222   NA 140 10/23/2011 1142   K 4.3 01/08/2021 1222   CL 106 01/08/2021 1222   CO2 24 01/08/2021 1222   GLUCOSE 78 01/08/2021 1222   BUN 21 01/08/2021 1222   BUN 20 10/23/2011 1142   CREATININE 1.17 01/08/2021 1222   CREATININE 1.26 (H) 12/22/2019 1508   CALCIUM 9.4 01/08/2021 1222   GFRNONAA 59 (L) 01/03/2021 1500   GFRAA 69 10/23/2011 1142   CrCl cannot be calculated (Patient's most recent lab result is older than the maximum 21 days allowed.).  COAG Lab Results  Component Value Date   INR 1.0 01/03/2021    Radiology CT HEAD WO CONTRAST  Result Date: 01/03/2021 CLINICAL DATA:  Neuro deficit, acute, stroke suspected. sudden onset word garbage , had 2 episodes lasting 2-3 minutes apart, 1st around 1400 and 2nd around 1415. Speech is clear at present, pt denies any numbness, weakness or other sx EXAM: CT HEAD WITHOUT CONTRAST TECHNIQUE: Contiguous axial images were obtained from the base of the skull through the vertex without intravenous contrast. COMPARISON:  MR head 05/06/2020 BRAIN: BRAIN Patchy and confluent areas of decreased attenuation are noted throughout the deep and periventricular white matter of the cerebral hemispheres bilaterally,  compatible with chronic microvascular ischemic disease. Limited evaluation of the temporal lobe, occipital lobes, cerebellum due to streak artifact. No evidence of large-territorial acute infarction. No parenchymal hemorrhage. No mass lesion. No extra-axial collection. No mass effect or midline shift. No hydrocephalus. Basilar cisterns are patent. Vascular: No hyperdense vessel. Skull: No acute fracture or focal lesion. Sinuses/Orbits: Paranasal sinuses and mastoid air cells are clear. The orbits are unremarkable. Other: None. IMPRESSION: No acute intracranial abnormality in a patient with chronic microvascular ischemic changes. Consider MRI head noncontrast for further evaluation. Electronically Signed   By: Iven Finn M.D.   On: 01/03/2021 15:43   MR ANGIO HEAD WO CONTRAST  Result Date: 01/03/2021 CLINICAL DATA:  Transient ischemic attack. Neuro deficit, acute, stroke suspected. Speech disturbance. EXAM: MRI HEAD WITHOUT CONTRAST MRA HEAD WITHOUT CONTRAST  TECHNIQUE: Multiplanar, multi-echo pulse sequences of the brain and surrounding structures were acquired without intravenous contrast. Angiographic images of the Circle of Willis were acquired using MRA technique without intravenous contrast. COMPARISON:  Head CT same day FINDINGS: MRI HEAD FINDINGS Brain: Diffusion imaging shows a few punctate foci of acute cortical infarction in the left posterior frontal vertex region. These are consistent with micro embolic infarctions in the left MCA territory. Possible minimal punctate white matter infarction posterior to the atrium of the left lateral ventricle, possibly incidental. The brainstem and cerebellum appear normal. Cerebral hemispheres otherwise show chronic small-vessel ischemic changes of the white matter. No mass, hemorrhage, hydrocephalus or extra-axial collection. Vascular: Major vessels at the base of the brain show flow. Skull and upper cervical spine: Negative Sinuses/Orbits: Clear/normal  Other: None MRA HEAD FINDINGS Anterior circulation: Both internal carotid arteries are widely patent through the skull base and siphon regions. The anterior and middle cerebral vessels are patent without evidence of large vessel occlusion. There is atherosclerotic narrowing and irregularity of the more distal branch vessels. Focal occlusion is present within the right M2 inferior division. Posterior circulation: Both vertebral arteries are patent to the basilar. No basilar stenosis. There is considerable atherosclerotic disease affecting the PCA branches, more extensive on the left than the right. Severe stenosis present on both sides. Anatomic variants: None significant IMPRESSION: Few small foci of acute infarction affecting the cortex to the brain in the left posterior frontal vertex. No hemorrhage or mass effect. Extensive chronic small-vessel ischemic changes elsewhere throughout the cerebral hemispheric white matter. Question incidental punctate acute white matter infarction just posterior to the atrium of the left lateral ventricle. No intracranial large vessel occlusion. Atherosclerotic narrowing and irregularity of the more distal MCA branches. Focal stenosis of the right M2 branch inferior division. Severe atherosclerotic disease of both PCAs with severe stenoses. Electronically Signed   By: Nelson Chimes M.D.   On: 01/03/2021 18:01   MR BRAIN WO CONTRAST  Result Date: 01/03/2021 CLINICAL DATA:  Transient ischemic attack. Neuro deficit, acute, stroke suspected. Speech disturbance. EXAM: MRI HEAD WITHOUT CONTRAST MRA HEAD WITHOUT CONTRAST TECHNIQUE: Multiplanar, multi-echo pulse sequences of the brain and surrounding structures were acquired without intravenous contrast. Angiographic images of the Circle of Willis were acquired using MRA technique without intravenous contrast. COMPARISON:  Head CT same day FINDINGS: MRI HEAD FINDINGS Brain: Diffusion imaging shows a few punctate foci of acute cortical  infarction in the left posterior frontal vertex region. These are consistent with micro embolic infarctions in the left MCA territory. Possible minimal punctate white matter infarction posterior to the atrium of the left lateral ventricle, possibly incidental. The brainstem and cerebellum appear normal. Cerebral hemispheres otherwise show chronic small-vessel ischemic changes of the white matter. No mass, hemorrhage, hydrocephalus or extra-axial collection. Vascular: Major vessels at the base of the brain show flow. Skull and upper cervical spine: Negative Sinuses/Orbits: Clear/normal Other: None MRA HEAD FINDINGS Anterior circulation: Both internal carotid arteries are widely patent through the skull base and siphon regions. The anterior and middle cerebral vessels are patent without evidence of large vessel occlusion. There is atherosclerotic narrowing and irregularity of the more distal branch vessels. Focal occlusion is present within the right M2 inferior division. Posterior circulation: Both vertebral arteries are patent to the basilar. No basilar stenosis. There is considerable atherosclerotic disease affecting the PCA branches, more extensive on the left than the right. Severe stenosis present on both sides. Anatomic variants: None significant IMPRESSION: Few small foci of acute  infarction affecting the cortex to the brain in the left posterior frontal vertex. No hemorrhage or mass effect. Extensive chronic small-vessel ischemic changes elsewhere throughout the cerebral hemispheric white matter. Question incidental punctate acute white matter infarction just posterior to the atrium of the left lateral ventricle. No intracranial large vessel occlusion. Atherosclerotic narrowing and irregularity of the more distal MCA branches. Focal stenosis of the right M2 branch inferior division. Severe atherosclerotic disease of both PCAs with severe stenoses. Electronically Signed   By: Nelson Chimes M.D.   On: 01/03/2021  18:01   US Carotid Bilateral (at [facility] and [facility] only)  Result Date: 01/03/2021 CLINICAL DATA:  TIA symptoms, garbled speech, hyperlipidemia EXAM: BILATERAL CAROTID DUPLEX ULTRASOUND TECHNIQUE: Pearline Cables scale imaging, color Doppler and duplex ultrasound were performed of bilateral carotid and vertebral arteries in the neck. COMPARISON:  None. FINDINGS: Criteria: Quantification of carotid stenosis is based on velocity parameters that correlate the residual internal carotid diameter with NASCET-based stenosis levels, using the diameter of the distal internal carotid lumen as the denominator for stenosis measurement. The following velocity measurements were obtained: RIGHT ICA: 85/26 cm/sec CCA: 161/09 cm/sec SYSTOLIC ICA/CCA RATIO:  0.8 ECA: 104 cm/sec LEFT ICA: 273/101 cm/sec CCA: 60/45 cm/sec SYSTOLIC ICA/CCA RATIO:  4.1 ECA: 145 cm/sec RIGHT CAROTID ARTERY: No significant atheromatous plaque. RIGHT VERTEBRAL ARTERY:  Normal antegrade flow. LEFT CAROTID ARTERY: There is significant nonshadowing likely noncalcified plaque at the origin of the left ICA. Significant spectral broadening within the left ICA waveform. LEFT VERTEBRAL ARTERY:  Normal antegrade flow. IMPRESSION: 1. Estimated 70-99% stenosis within the left ICA, based on grayscale imaging and Doppler interrogation. Significant plaque within the proximal left ICA on grayscale imaging without evidence of calcification or posterior shadowing. Follow-up CTA may be useful for further evaluation. 2. Estimated 0-49% stenosis within the right ICA. 3. Antegrade flow within the bilateral vertebral arteries. Electronically Signed   By: Randa Ngo M.D.   On: 01/03/2021 20:56   ECHOCARDIOGRAM COMPLETE  Result Date: 01/04/2021    ECHOCARDIOGRAM REPORT   Patient Name:   Bobby Murillo Date of Exam: 01/04/2021 Medical Rec #:  [id number]      Height:       73.0 in Accession #:    [id number]     Weight:       200.0 lb Date of Birth:  25-Oct-1940       BSA:          2.152 m  Patient Age:    38 years       BP:           129/60 mmHg Patient Gender: M              HR:           86 bpm. Exam Location:  [facility] Procedure: 2D Echo Indications:     TIA  History:         Patient has no prior history of Echocardiogram examinations.                  Risk Factors:Hypertension, Dyslipidemia and CKD, CA.  Sonographer:     L Thornton-Maynard Referring Phys:  [id number] NIU Diagnosing Phys: Donnelly Angelica  Sonographer Comments: Patient is > 60 yrs old. No Saline study performed. IMPRESSIONS  1. Left ventricular ejection fraction, by estimation, is 60 to 65%. The left ventricle has normal function. The left ventricle has no regional wall motion abnormalities. Left ventricular diastolic parameters were normal.  2. Right  ventricular systolic function is normal. The right ventricular size is normal.  3. The mitral valve is normal in structure. No evidence of mitral valve regurgitation.  4. The aortic valve is normal in structure. Aortic valve regurgitation is not visualized. No aortic stenosis is present.  5. Possible left to right shunt seen by doppler. Conclusion(s)/Recommendation(s): Cannot exclude ASD/PFO. Consider transesophageal echocardiogram, if clinically indicated. FINDINGS  Left Ventricle: Left ventricular ejection fraction, by estimation, is 60 to 65%. The left ventricle has normal function. The left ventricle has no regional wall motion abnormalities. The left ventricular internal cavity size was normal in size. There is  no left ventricular hypertrophy. Left ventricular diastolic parameters were normal. Right Ventricle: The right ventricular size is normal. No increase in right ventricular wall thickness. Right ventricular systolic function is normal. Left Atrium: Left atrial size was normal in size. Right Atrium: Right atrial size was normal in size. Pericardium: There is no evidence of pericardial effusion. Mitral Valve: The mitral valve is normal in structure. No evidence of mitral valve  regurgitation. Tricuspid Valve: The tricuspid valve is normal in structure. Tricuspid valve regurgitation is trivial. Aortic Valve: The aortic valve is normal in structure. Aortic valve regurgitation is not visualized. No aortic stenosis is present. Aortic valve mean gradient measures 3.5 mmHg. Aortic valve peak gradient measures 7.3 mmHg. Aortic valve area, by VTI measures 2.99 cm. Pulmonic Valve: The pulmonic valve was not well visualized. Pulmonic valve regurgitation is not visualized. Aorta: The aortic root is normal in size and structure. IAS/Shunts: Possible left to right shunt seen by doppler.  LEFT VENTRICLE PLAX 2D LVIDd:         5.00 cm     Diastology LVIDs:         3.20 cm     LV e' medial:    6.75 cm/s LV PW:         1.00 cm     LV E/e' medial:  10.4 LV IVS:        0.70 cm     LV e' lateral:   7.73 cm/s LVOT diam:     2.10 cm     LV E/e' lateral: 9.1 LV SV:         74 LV SV Index:   34 LVOT Area:     3.46 cm  LV Volumes (MOD) LV vol d, MOD A2C: 42.0 ml LV vol d, MOD A4C: 73.6 ml LV vol s, MOD A2C: 23.0 ml LV vol s, MOD A4C: 25.0 ml LV SV MOD A2C:     19.0 ml LV SV MOD A4C:     73.6 ml LV SV MOD BP:      36.2 ml RIGHT VENTRICLE RV S prime:     17.00 cm/s TAPSE (M-mode): 2.5 cm LEFT ATRIUM           Index LA diam:      2.60 cm 1.21 cm/m LA Vol (A2C): 19.8 ml 9.20 ml/m LA Vol (A4C): 23.3 ml 10.83 ml/m  AORTIC VALVE AV Area (Vmax):    2.76 cm AV Area (Vmean):   2.84 cm AV Area (VTI):     2.99 cm AV Vmax:           135.50 cm/s AV Vmean:          88.800 cm/s AV VTI:            0.247 m AV Peak Grad:      7.3 mmHg AV Mean Grad:  3.5 mmHg LVOT Vmax:         108.00 cm/s LVOT Vmean:        72.800 cm/s LVOT VTI:          0.213 m LVOT/AV VTI ratio: 0.86  AORTA Ao Root diam: 3.70 cm MV E velocity: 70.30 cm/s  TRICUSPID VALVE MV A velocity: 85.70 cm/s  TR Peak grad:   24.2 mmHg MV E/A ratio:  0.82        TR Vmax:        246.00 cm/s                             SHUNTS                            Systemic VTI:   0.21 m                            Systemic Diam: 2.10 cm Donnelly Angelica Electronically signed by Donnelly Angelica Signature Date/Time: 01/04/2021/12:33:45 PM    Final      Assessment/Plan 1. Carotid stenosis, symptomatic, with infarction [facility]) Recommend:  The patient remains asymptomatic with respect to the carotid stenosis.  However, the patient has now progressed and has a lesion the is 95%.  Patient's CT angiography of the carotid arteries confirms 95% left ICA stenosis.  The anatomical considerations support surgery over stenting.  This was discussed in detail with the patient.  The patient does indeed need surgery, therefore, cardiac clearance will be arranged. Once cleared the patient will be scheduled for surgery.  The risks, benefits and alternative therapies were reviewed in detail with the patient.  All questions were answered.  The patient agrees to proceed with surgery of the left carotid artery.  Continue antiplatelet therapy as prescribed. Continue management of CAD, HTN and Hyperlipidemia. Healthy heart diet, encouraged exercise at least 4 times per week.   - Ambulatory referral to Cardiology  2. Essential hypertension Continue antihypertensive medications as already ordered, these medications have been reviewed and there are no changes at this time.  - Ambulatory referral to Cardiology  3. Abdominal aortic atherosclerosis (HCC) Recommend:  I do not find evidence of life style limiting vascular disease. The patient specifically denies life style limitation.  Previous noninvasive studies including ABI's of the legs do not identify critical vascular problems.  The patient should continue walking and begin a more formal exercise program. The patient should continue his antiplatelet therapy and aggressive treatment of the lipid abnormalities.  The patient should begin wearing graduated compression socks 15-20 mmHg strength to control her mild edema.  4. Hyperlipidemia LDL goal  <100 Continue statin as ordered and reviewed, no changes at this time     Hortencia Pilar, MD  01/29/2021 1:54 PM

## 2021-01-30 ENCOUNTER — Encounter (INDEPENDENT_AMBULATORY_CARE_PROVIDER_SITE_OTHER): Payer: Self-pay | Admitting: Vascular Surgery

## 2021-01-30 ENCOUNTER — Ambulatory Visit
Admission: RE | Admit: 2021-01-30 | Discharge: 2021-01-30 | Disposition: A | Discharge status: Home / Self Care | Payer: Medicare Other | Source: Ambulatory Visit | Attending: Vascular Surgery | Admitting: Vascular Surgery

## 2021-01-30 ENCOUNTER — Ambulatory Visit (INDEPENDENT_AMBULATORY_CARE_PROVIDER_SITE_OTHER): Payer: Medicare Other | Admitting: Vascular Surgery

## 2021-01-30 ENCOUNTER — Other Ambulatory Visit: Payer: Self-pay

## 2021-01-30 VITALS — BP 135/80 | HR 80 | Resp 16 | Wt 210.2 lb

## 2021-01-30 DIAGNOSIS — I10 Essential (primary) hypertension: Secondary | ICD-10-CM

## 2021-01-30 DIAGNOSIS — E785 Hyperlipidemia, unspecified: Secondary | ICD-10-CM

## 2021-01-30 DIAGNOSIS — I63239 Cerebral infarction due to unspecified occlusion or stenosis of unspecified carotid arteries: Secondary | ICD-10-CM

## 2021-01-30 DIAGNOSIS — I6523 Occlusion and stenosis of bilateral carotid arteries: Secondary | ICD-10-CM | POA: Diagnosis not present

## 2021-01-30 DIAGNOSIS — I700 Atherosclerosis of aorta: Secondary | ICD-10-CM | POA: Diagnosis not present

## 2021-01-30 MED ORDER — IOHEXOL 350 MG/ML SOLN
75.0000 mL | Freq: Once | INTRAVENOUS | Status: AC | PRN
  Administered 2021-01-30: 11:00:00 75 mL via INTRAVENOUS

## 2021-01-30 NOTE — OR Nursing (Signed)
IV conversed to med lock per request of Adonis Brook in radiology to receive dye for CT scan. She reports she will remove it prior to discharge to home.

## 2021-01-31 ENCOUNTER — Encounter: Payer: Self-pay | Admitting: Cardiology

## 2021-01-31 ENCOUNTER — Ambulatory Visit: Payer: Medicare Other | Admitting: Cardiology

## 2021-01-31 VITALS — BP 160/64 | HR 62 | Ht 73.0 in | Wt 209.0 lb

## 2021-01-31 DIAGNOSIS — I6522 Occlusion and stenosis of left carotid artery: Secondary | ICD-10-CM

## 2021-01-31 DIAGNOSIS — I10 Essential (primary) hypertension: Secondary | ICD-10-CM | POA: Diagnosis not present

## 2021-01-31 DIAGNOSIS — E7800 Pure hypercholesterolemia, unspecified: Secondary | ICD-10-CM | POA: Diagnosis not present

## 2021-01-31 DIAGNOSIS — Z01810 Encounter for preprocedural cardiovascular examination: Secondary | ICD-10-CM

## 2021-01-31 NOTE — Patient Instructions (Signed)
Medication Instructions:  Your physician recommends that you continue on your current medications as directed. Please refer to the Current Medication list given to you today.  *If you need a refill on your cardiac medications before your next appointment, please call your pharmacy*   Lab Work: None ordered  If you have labs (blood work) drawn today and your tests are completely normal, you will receive your results only by: [place] (if you have MyChart) OR A paper copy in the mail If you have any lab test that is abnormal or we need to change your treatment, we will call you to review the results.   Testing/Procedures: Your physician has requested that you have a lexiscan myoview. For further information please visit [email or web address]. Please follow instruction sheet, as given.    Follow-Up: At [place], you and your health needs are our priority.  As part of our continuing mission to provide you with exceptional heart care, we have created designated Provider Care Teams.  These Care Teams include your primary Cardiologist (physician) and Advanced Practice Providers (APPs -  Physician Assistants and Nurse Practitioners) who all work together to provide you with the care you need, when you need it.  We recommend signing up for the patient portal called "MyChart".  Sign up information is provided on this After Visit Summary.  MyChart is used to connect with patients for Virtual Visits (Telemedicine).  Patients are able to view lab/test results, encounter notes, upcoming appointments, etc.  Non-urgent messages can be sent to your provider as well.   To learn more about what you can do with MyChart, go to [email or web address].    Your next appointment:   3 month(s)  The format for your next appointment:   In Person  Provider:   You may see Kate Sable, MD or one of the following Advanced Practice Providers on your designated Care Team:   Murray Hodgkins,  NP Christell Faith, PA-C Cadence Kathlen Mody, [place]    Other Instructions [place]  Your caregiver has ordered a Stress Test with nuclear imaging. The purpose of this test is to evaluate the blood supply to your heart muscle. This procedure is referred to as a "Non-Invasive Stress Test." This is because other than having an IV started in your vein, nothing is inserted or "invades" your body. Cardiac stress tests are done to find areas of poor blood flow to the heart by determining the extent of coronary artery disease (CAD). Some patients exercise on a treadmill, which naturally increases the blood flow to your heart, while others who are  unable to walk on a treadmill due to physical limitations have a pharmacologic/chemical stress agent called Lexiscan . This medicine will mimic walking on a treadmill by temporarily increasing your coronary blood flow.   Please note: these test may take anywhere between 2-4 hours to complete  PLEASE REPORT TO [place] AT THE FIRST DESK WILL DIRECT YOU WHERE TO GO  Date of Procedure:_____________________________________  Arrival Time for Procedure:______________________________  Instructions regarding medication:     PLEASE NOTIFY THE OFFICE AT LEAST 24 HOURS IN ADVANCE IF YOU ARE UNABLE TO KEEP YOUR APPOINTMENT.  [phone] AND  PLEASE NOTIFY NUCLEAR MEDICINE AT [facility] AT LEAST 24 HOURS IN ADVANCE IF YOU ARE UNABLE TO KEEP YOUR APPOINTMENT. [phone]  How to prepare for your Myoview test:  Do not eat or drink after midnight No caffeine for 24 hours prior to test No smoking 24 hours prior to  test. Your medication may be taken with water.  If your doctor stopped a medication because of this test, do not take that medication. Please wear a short sleeve shirt. No perfume, cologne or lotion. Wear comfortable walking shoes. No heels!

## 2021-01-31 NOTE — Progress Notes (Signed)
Cardiology Office Note:    Date:  01/31/2021   ID:  Bobby Murillo, DOB 02/04/1941, [name] [id number]  PCP:  Crecencio Mc, MD   [facility] Providers Cardiologist:  None     Referring MD: Crecencio Mc, MD   Chief Complaint  Patient presents with   New Patient (Initial Visit)    Referred by Vasc surgery. Meds reviewed verbally with patient.     History of Present Illness:    Bobby Murillo is a 80 y.o. male with a hx of hyperlipidemia, hypertension, CKD 3, CVA , carotid artery stenosis who presents for preop evaluation.  Seen in the hospital last month due to speech disturbance.  Work-up with MRI did not reveal acute infarction in the left cortex.  Carotid CT angiography did not show 95% left ICA stenosis.  Evaluated by vascular surgery, surgical management is being planned.  He is currently on aspirin, Plavix, Lipitor, Zetia.  Echocardiogram 01/04/2021 showed normal ejection fraction EF 55 to 60%, no gross structural abnormalities.  He denies chest pain or shortness of breath.  Denies any history of CAD or MI.  Has history of statin allergies, currently takes Lipitor twice a week.  Also takes Zetia.  Past Medical History:  Diagnosis Date   Abnormal LFTs    Anxiety    Basal cell carcinoma 09/14/2010   left calf    Hyperlipidemia    prostate cancer 03/16/2008   Dr. Ronny Bacon   Sinus bradycardia by electrocardiogram    Stroke [facility])     Past Surgical History:  Procedure Laterality Date   PROSTATECTOMY  11/2008   transurethral, radical, Dr. Darcus Austin    Current Medications: Current Meds  Medication Sig   acetaminophen (TYLENOL) 325 MG tablet Take 650 mg by mouth every 6 (six) hours as needed.   ALPRAZolam (XANAX) 0.5 MG tablet TAKE 1 TABLET BY MOUTH AT BEDTIME AS NEEDED FOR SLEEP   Ascorbic Acid (VITAMIN C PO) Take by mouth.   aspirin EC 81 MG EC tablet Take 1 tablet (81 mg total) by mouth daily. Swallow whole.   atorvastatin (LIPITOR) 20 MG tablet Take 1 tablet (20 mg  total) by mouth 2 (two) times a week.   clopidogrel (PLAVIX) 75 MG tablet Take 1 tablet (75 mg total) by mouth daily.   Cyanocobalamin (B-12 PO) Take by mouth.   ezetimibe (ZETIA) 10 MG tablet Take 1 tablet by mouth once daily   fluticasone (FLONASE) 50 MCG/ACT nasal spray Place 2 sprays into both nostrils daily.   Glucosamine-Chondroitin (GLUCOSAMINE CHONDR COMPLEX PO) Take 1 tablet by mouth daily. tab by mouth daily   losartan (COZAAR) 50 MG tablet Take 1 tablet by mouth once daily   polycarbophil (FIBERCON) 625 MG tablet Take 625 mg by mouth daily. Reported on 05/29/2015   TURMERIC PO Take by mouth.     Allergies:   Alfuzosin and Pravastatin sodium   Social History   Socioeconomic History   Marital status: Married    Spouse name: Not on file   Number of children: Not on file   Years of education: Not on file   Highest education level: Not on file  Occupational History   Not on file  Tobacco Use   Smoking status: Never   Smokeless tobacco: Never  Vaping Use   Vaping Use: Never used  Substance and Sexual Activity   Alcohol use: Yes    Alcohol/week: 8.0 standard drinks    Types: 4 Glasses of wine, 4 Shots of  liquor per week    Comment: occasional   Drug use: No   Sexual activity: Yes  Other Topics Concern   Not on file  Social History Narrative   Patient lives in [place] with his wife.  Non-smoker.  Drinks over the weekend.  Retired from [employer].      Right handed   Social Determinants of Health   Financial Resource Strain: Low Risk    Difficulty of Paying Living Expenses: Not hard at all  Food Insecurity: No Food Insecurity   Worried About Charity fundraiser in the Last Year: Never true   Ran Out of Food in the Last Year: Never true  Transportation Needs: No Transportation Needs   Lack of Transportation (Medical): No   Lack of Transportation (Non-Medical): No  Physical Activity: Not on file  Stress: No Stress Concern Present   Feeling of Stress : Not at all   Social Connections: Unknown   Frequency of Communication with Friends and Family: More than three times a week   Frequency of Social Gatherings with Friends and Family: Once a week   Attends Religious Services: Not on Electrical engineer or Organizations: Yes   Attends Archivist Meetings: Not on file   Marital Status: Married     Family History: The patient's family history includes Alzheimer's disease in his mother and sister; Heart disease (age of onset: 59) in his paternal uncle; Prostate cancer in his father; Stroke in his father and paternal aunt.  ROS:   Please see the history of present illness.     All other systems reviewed and are negative.  EKGs/Labs/Other Studies Reviewed:    The following studies were reviewed today:   EKG:  EKG is  ordered today.  The ekg ordered today demonstrates sinus rhythm, PACs, otherwise normal ECG.  Recent Labs: 12/23/2020: TSH 2.19 01/08/2021: ALT 33; BUN 21; Creatinine, Ser 1.17; Hemoglobin 16.7; Magnesium 2.1; Platelets 221.0; Potassium 4.3; Sodium 139  Recent Lipid Panel    Component Value Date/Time   CHOL 186 01/03/2021 1500   CHOL 280 (H) 10/23/2011 1142   TRIG 93 01/03/2021 1500   HDL 64 01/03/2021 1500   HDL 63 10/23/2011 1142   CHOLHDL 2.9 01/03/2021 1500   VLDL 19 01/03/2021 1500   LDLCALC 103 (H) 01/03/2021 1500   LDLCALC 192 (H) 10/23/2011 1142   LDLDIRECT 184.0 12/30/2015 0929     Risk Assessment/Calculations:          Physical Exam:    VS:  BP (!) 160/64 (BP Location: Left Arm, Patient Position: Sitting, Cuff Size: Normal)   Pulse 62   Ht 6\' 1"  (1.854 m)   Wt 209 lb (94.8 kg)   SpO2 97%   BMI 27.57 kg/m     Wt Readings from Last 3 Encounters:  01/31/21 209 lb (94.8 kg)  01/30/21 210 lb 3.2 oz (95.3 kg)  01/27/21 207 lb 9.6 oz (94.2 kg)     GEN:  Well nourished, well developed in no acute distress HEENT: Normal NECK: Left carotid bruit noted CARDIAC: RRR, no murmurs, rubs,  gallops RESPIRATORY:  Clear to auscultation without rales, wheezing or rhonchi  ABDOMEN: Soft, non-tender, non-distended MUSCULOSKELETAL:  No edema; No deformity  SKIN: Warm and dry NEUROLOGIC:  Alert and oriented x 3 PSYCHIATRIC:  Normal affect   ASSESSMENT:    1. Pre-operative cardiovascular examination   2. Primary hypertension   3. Pure hypercholesterolemia   4. Stenosis of left internal  carotid artery    PLAN:    In order of problems listed above:  Preop eval prior to left carotid artery endarterectomy.  Echo with preserved ejection fraction, obtain Lexiscan Myoview to evaluate presence of ischemia.  If no significant ischemia noted on Lexiscan Myoview, okay to proceed with surgery from a cardiac perspective.  Patient denies chest pain or shortness of breath.  Has no history of CAD/MI Hypertension, continue losartan 50 mg daily for now.  Consider titrating at follow-up. Hyperlipidemia, statin allergies, LDL not at goal.  Plan to initiate PCSK9 after surgical intervention. Left carotid artery stenosis, aspirin, Plavix, Lipitor.  Being followed by vascular surgery.  Intervention pending cardiac work-up.  Follow-up in 3 months.      Shared Decision Making/Informed Consent The risks [chest pain, shortness of breath, cardiac arrhythmias, dizziness, blood pressure fluctuations, myocardial infarction, stroke/transient ischemic attack, nausea, vomiting, allergic reaction, radiation exposure, metallic taste sensation and life-threatening complications (estimated to be 1 in 10,000)], benefits (risk stratification, diagnosing coronary artery disease, treatment guidance) and alternatives of a nuclear stress test were discussed in detail with Bobby Murillo and he agrees to proceed.    Medication Adjustments/Labs and Tests Ordered: Current medicines are reviewed at length with the patient today.  Concerns regarding medicines are outlined above.  Orders Placed This Encounter  Procedures   NM  Myocar Multi W/Spect W/Wall Motion / EF   Cardiac Stress Test: Informed Consent Details: Physician/Practitioner Attestation; Transcribe to consent form and obtain patient signature   EKG 12-Lead   No orders of the defined types were placed in this encounter.   Patient Instructions  Medication Instructions:  Your physician recommends that you continue on your current medications as directed. Please refer to the Current Medication list given to you today.  *If you need a refill on your cardiac medications before your next appointment, please call your pharmacy*   Lab Work: None ordered  If you have labs (blood work) drawn today and your tests are completely normal, you will receive your results only by: [facility] (if you have MyChart) OR A paper copy in the mail If you have any lab test that is abnormal or we need to change your treatment, we will call you to review the results.   Testing/Procedures: Your physician has requested that you have a lexiscan myoview. For further information please visit [email or web address]. Please follow instruction sheet, as given.    Follow-Up: At [facility], you and your health needs are our priority.  As part of our continuing mission to provide you with exceptional heart care, we have created designated Provider Care Teams.  These Care Teams include your primary Cardiologist (physician) and Advanced Practice Providers (APPs -  Physician Assistants and Nurse Practitioners) who all work together to provide you with the care you need, when you need it.  We recommend signing up for the patient portal called "MyChart".  Sign up information is provided on this After Visit Summary.  MyChart is used to connect with patients for Virtual Visits (Telemedicine).  Patients are able to view lab/test results, encounter notes, upcoming appointments, etc.  Non-urgent messages can be sent to your provider as well.   To learn more about what you can do with  MyChart, go to [email or web address].    Your next appointment:   3 month(s)  The format for your next appointment:   In Person  Provider:   You may see Kate Sable, MD or one of the following Advanced Practice Providers on your  designated Care Team:   Murray Hodgkins, NP Christell Faith, PA-C Cadence Kathlen Mody, [place]    Other Instructions [place]  Your caregiver has ordered a Stress Test with nuclear imaging. The purpose of this test is to evaluate the blood supply to your heart muscle. This procedure is referred to as a "Non-Invasive Stress Test." This is because other than having an IV started in your vein, nothing is inserted or "invades" your body. Cardiac stress tests are done to find areas of poor blood flow to the heart by determining the extent of coronary artery disease (CAD). Some patients exercise on a treadmill, which naturally increases the blood flow to your heart, while others who are  unable to walk on a treadmill due to physical limitations have a pharmacologic/chemical stress agent called Lexiscan . This medicine will mimic walking on a treadmill by temporarily increasing your coronary blood flow.   Please note: these test may take anywhere between 2-4 hours to complete  PLEASE REPORT TO [place] AT THE FIRST DESK WILL DIRECT YOU WHERE TO GO  Date of Procedure:_____________________________________  Arrival Time for Procedure:______________________________  Instructions regarding medication:     PLEASE NOTIFY THE OFFICE AT LEAST 24 HOURS IN ADVANCE IF YOU ARE UNABLE TO KEEP YOUR APPOINTMENT.  [phone] AND  PLEASE NOTIFY NUCLEAR MEDICINE AT [facility] AT LEAST 24 HOURS IN ADVANCE IF YOU ARE UNABLE TO KEEP YOUR APPOINTMENT. [phone]  How to prepare for your Myoview test:  Do not eat or drink after midnight No caffeine for 24 hours prior to test No smoking 24 hours prior to test. Your medication may be taken with water.   If your doctor stopped a medication because of this test, do not take that medication. Please wear a short sleeve shirt. No perfume, cologne or lotion. Wear comfortable walking shoes. No heels!       Signed, Kate Sable, MD  01/31/2021 12:34 PM    [place]

## 2021-02-08 ENCOUNTER — Other Ambulatory Visit: Payer: Self-pay | Admitting: Internal Medicine

## 2021-02-10 ENCOUNTER — Encounter
Admission: RE | Admit: 2021-02-10 | Discharge: 2021-02-10 | Disposition: A | Discharge status: Home / Self Care | Payer: Medicare Other | Source: Ambulatory Visit | Attending: Cardiology | Admitting: Cardiology

## 2021-02-10 DIAGNOSIS — Z01810 Encounter for preprocedural cardiovascular examination: Secondary | ICD-10-CM | POA: Diagnosis not present

## 2021-02-10 LAB — NM MYOCAR MULTI W/SPECT W/WALL MOTION / EF
LV dias vol: 58 mL (ref 62–150)
LV sys vol: 15 mL
MPHR: 142 {beats}/min
Nuc Stress EF: 74 %
Peak HR: 93 {beats}/min
Percent HR: 65 %
Rest HR: 61 {beats}/min
Rest Nuclear Isotope Dose: 11 mCi
SDS: 5
SRS: 0
SSS: 1
ST Depression (mm): 0 mm
Stress Nuclear Isotope Dose: 32.1 mCi
TID: 0.84

## 2021-02-10 MED ORDER — REGADENOSON 0.4 MG/5ML IV SOLN
0.4000 mg | Freq: Once | INTRAVENOUS | Status: AC
  Administered 2021-02-10: 09:00:00 0.4 mg via INTRAVENOUS

## 2021-02-10 MED ORDER — TECHNETIUM TC 99M TETROFOSMIN IV KIT
30.0000 | PACK | Freq: Once | INTRAVENOUS | Status: AC | PRN
  Administered 2021-02-10: 09:00:00 32.1 via INTRAVENOUS

## 2021-02-10 MED ORDER — TECHNETIUM TC 99M TETROFOSMIN IV KIT
10.0000 | PACK | Freq: Once | INTRAVENOUS | Status: AC | PRN
  Administered 2021-02-10: 08:00:00 11 via INTRAVENOUS

## 2021-02-10 NOTE — Telephone Encounter (Signed)
LOV: 01/08/21 NOV: 05/12/2021

## 2021-02-13 ENCOUNTER — Telehealth (INDEPENDENT_AMBULATORY_CARE_PROVIDER_SITE_OTHER): Payer: Self-pay

## 2021-02-13 NOTE — Telephone Encounter (Signed)
Spoke with the patient and gave him the the information regarding his surgery with Dr. Delana Meyer on 02/19/21 at the [facility]. Pre-op is phone call on 02/14/21 between 8-1 pm and covid testing at the [facility] between 8-12 pm. Pre-surgical instructions were discussed and will be at the front desk for pick up.

## 2021-02-14 ENCOUNTER — Other Ambulatory Visit (INDEPENDENT_AMBULATORY_CARE_PROVIDER_SITE_OTHER): Payer: Self-pay | Admitting: Nurse Practitioner

## 2021-02-14 ENCOUNTER — Encounter: Payer: Self-pay | Admitting: Vascular Surgery

## 2021-02-14 ENCOUNTER — Other Ambulatory Visit
Admission: RE | Admit: 2021-02-14 | Discharge: 2021-02-14 | Disposition: A | Discharge status: Home / Self Care | Payer: Medicare Other | Source: Ambulatory Visit | Attending: Vascular Surgery | Admitting: Vascular Surgery

## 2021-02-14 ENCOUNTER — Other Ambulatory Visit: Payer: Self-pay

## 2021-02-14 DIAGNOSIS — I63239 Cerebral infarction due to unspecified occlusion or stenosis of unspecified carotid arteries: Secondary | ICD-10-CM

## 2021-02-14 HISTORY — DX: Essential (primary) hypertension: I10

## 2021-02-14 HISTORY — DX: Unspecified osteoarthritis, unspecified site: M19.90

## 2021-02-14 HISTORY — DX: Personal history of urinary calculi: Z87.442

## 2021-02-14 NOTE — Progress Notes (Signed)
Perioperative Services  Pre-Admission/Anesthesia Testing Clinical Review  Date: 02/14/21  Patient Demographics:  Name: Bobby Murillo DOB:   February 11, 1941 [name]:   [id number]  Planned Surgical Procedure(s):    Case: [id number] Date/Time: 02/19/21 0815   Procedure: ENDARTERECTOMY CAROTID (Left)   Anesthesia type: General   Pre-op diagnosis: CAROTID ARTERY STENOSIS   Location: [place] [facility] [place] [facility]   Surgeons: Katha Cabal, MD   NOTE: Available PAT nursing documentation and vital signs have been reviewed. Clinical nursing staff has updated patient's PMH/PSHx, current medication list, and drug allergies/intolerances to ensure comprehensive history available to assist in medical decision making as it pertains to the aforementioned surgical procedure and anticipated anesthetic course. Extensive review of available clinical information performed. [place] PMH and PSHx updated with any diagnoses/procedures that  may have been inadvertently omitted during his intake with the pre-admission testing department's nursing staff.  Clinical Discussion:  Bobby Murillo is a 80 y.o. male who is submitted for pre-surgical anesthesia review and clearance prior to him undergoing the above procedure. Patient has never been a smoker. Pertinent PMH includes: carotid artery stenosis, CVA, aortic atherosclerosis, HTN, HLD, CKD-III, nephrolithiasis, anxiety (on BZO).   Patient is followed by cardiology Bobby Lah, MD). He was last seen in the cardiology clinic on 01/31/2021; notes reviewed.  At the time of his clinic visit, patient doing well overall from a cardiovascular perspective.  He denied any episodes of chest pain, shortness breath, PND, orthopnea, palpitations, significant peripheral edema, vertiginous symptoms, or presyncope/syncope.  Patient suffered an acute CVA back in 12/2020.  Patient presented to the ED on 01/03/2021 with complaints of acute onset speech disturbance.  CT  imaging of the head revealed no acute intracranial abnormalities.  Further imaging via MRI revealed an acute infarction affecting the cortex and the LEFT posterior frontal vertex. There was no hemorrhage or mass-effect noted.  Additionally, there was incidental punctate acute white matter infarction just posterior to the atrium of the LEFT lateral ventricle.  Imaging revealed focal stenosis of the right M2 branch inferior division and severe atherosclerotic disease of both PCAs with severe stenosis.  Patient was admitted to the hospital for ongoing care management.  Carotid doppler study performed on 01/03/2021 revealed a 70-99% stenosis within the LICA. Contralaterally, there was a 0 to 49% stenosis noted within the RICA.  Antegrade flow noted within the BILATERAL vertebral arteries.  TTE performed on 01/04/2021 revealed normal left ventricular systolic function with an EF of 60-65%.  There were no regional wall motion abnormalities. There is no evidence of significant valvular regurgitation or transvalvular gradient suggestive of stenosis.     Myocardial perfusion imaging study performed 02/10/2021 demonstrated a normal left ventricular systolic function with no evidence of stress-induced myocardial ischemia or arrhythmia. Study determined to be normal and low risk.  Following CVA, patient is on daily DAPT therapy (ASA + clopidogrel); compliant with therapy with no evidence or reports of GI bleeding.  Blood pressure elevated at 160/64 on prescribed ARB monotherapy.  Despite elevation, decision was made to continue current therapy with plans to up titrate at next follow-up. Patient is on a statin for his HLD.  Following surgery, plans are to start patient on additional PCSK9 therapy for his HLD.  He is not diabetic. Functional capacity, as defined by DASI, is documented as being >/= 4 METS.  No changes were made to his medication regimen.  Patient to follow-up with outpatient cardiology in 3 months or  sooner  if needed.  [name] is scheduled for a LEFT CAROTID ENDARTERECTOMY on 02/19/2021 with Dr. Hortencia Pilar, MD. Given patient's past medical history significant for cardiovascular diagnoses, presurgical cardiac clearance was sought by the PAT team.  Per cardiology, "patient has no chest pain or shortness of breath.  He has no history of CAD or acute MI. Patient is okay to proceed with surgery from a cardiac perspective and an overall ACCEPTABLE risk of significant cardiovascular complications".  Again, this patient is on daily DAPT therapy.  He has been instructed on recommendations for holding his clopidogrel for 5 days prior to his procedure with plans to restart as soon as postoperative bleeding risk felt to be minimized by his primary attending surgeon; last dose of clinic or will be on 02/13/2021.  Patient will continue his daily low-dose ASA throughout the perioperative period.  Patient denies previous perioperative complications with anesthesia in the past.  In review his EMR, there are no records available for review pertaining to past procedural/anesthetic courses within the [facility] system.  Vitals with BMI 01/31/2021 01/30/2021 01/27/2021  Height 6\' 1"  - 6\' 1"   Weight 209 lbs 210 lbs 3 oz 207 lbs 10 oz  BMI 27.58 47.09 62.8  Systolic 366 294 765  Diastolic 64 80 86  Pulse 62 80 92  Some encounter information is confidential and restricted. Go to Review Flowsheets activity to see all data.    Providers/Specialists:   NOTE: Primary physician provider listed below. Patient may have been seen by APP or partner within same practice.   PROVIDER ROLE / SPECIALTY LAST Anne Hahn, Dolores Lory, MD Vascular Surgery 01/30/2021  Crecencio Mc, MD Primary Care Provider 12/23/2020  Kate Sable, MD Cardiology 01/31/2021   Allergies:  Alfuzosin and Pravastatin sodium  Current Home Medications:   No current facility-administered medications for this encounter.     acetaminophen (TYLENOL) 325 MG tablet   ALPRAZolam (XANAX) 0.5 MG tablet   aspirin EC 81 MG EC tablet   atorvastatin (LIPITOR) 20 MG tablet   clopidogrel (PLAVIX) 75 MG tablet   ezetimibe (ZETIA) 10 MG tablet   Glucosamine-Chondroitin (GLUCOSAMINE CHONDR COMPLEX PO)   losartan (COZAAR) 50 MG tablet   polycarbophil (FIBERCON) 625 MG tablet   fluticasone (FLONASE) 50 MCG/ACT nasal spray   History:   Past Medical History:  Diagnosis Date   Abnormal LFTs    Anxiety    Aortic atherosclerosis (HCC)    Arthritis    Basal cell carcinoma 09/14/2010   left calf    Bilateral carotid artery stenosis    a.) Carotid doppler 46/50/3546: 56-81% LICA; 2-75% RICA. b.) CTA neck 01/30/2021: critical stenosis LEFT carotid bulb.   CKD (chronic kidney disease), stage III ([place])    History of kidney stones    Hyperlipidemia    Hypertension    Long term current use of antithrombotics/antiplatelets    a.) DAPT therapy (ASA + clopidogrel)   Prostate cancer ([place]) 03/16/2008   Dr. Ronny Bacon   Sinus bradycardia by electrocardiogram    Stroke ([place]) 01/03/2021   a.) Acute infarction affecting the cortex. ?? incidental punctate acute white matter infarction just posterior to the atrium of the left lateral ventricle. Stenosis of the right M2 branch inferior division. Severe atherosclerotic disease of both PCAs with severe stenoses.   Past Surgical History:  Procedure Laterality Date   COLONOSCOPY     PROSTATECTOMY  11/14/2008   transurethral, radical, Dr. Darcus Austin   Family History  Problem Relation Age of  Onset   Alzheimer's disease Mother    Stroke Father    Prostate cancer Father    Alzheimer's disease Sister    Stroke Paternal Aunt    Heart disease Paternal Uncle 67   Social History   Tobacco Use   Smoking status: Never   Smokeless tobacco: Never  Vaping Use   Vaping Use: Never used  Substance Use Topics   Alcohol use: Yes    Alcohol/week: 8.0 standard drinks    Types: 4 Glasses of wine, 4 Shots of  liquor per week    Comment: occasional   Drug use: No    Pertinent Clinical Results:  LABS: Labs reviewed: Acceptable for surgery.  Lab Results  Component Value Date   WBC 6.8 01/08/2021   HGB 16.7 01/08/2021   HCT 50.4 01/08/2021   MCV 93.6 01/08/2021   PLT 221.0 01/08/2021   Lab Results  Component Value Date   NA 139 01/08/2021   K 4.3 01/08/2021   CO2 24 01/08/2021   GLUCOSE 78 01/08/2021   BUN 21 01/08/2021   CREATININE 1.17 01/08/2021   CALCIUM 9.4 01/08/2021   GFRNONAA 59 (L) 01/03/2021    ECG: Date: 01/31/2021 Time ECG obtained: 1104 AM Rate: 62 bpm Rhythm:  Sinus rhythm with PACs Axis (leads I and aVF): Normal Intervals: PR 134 ms. QRS 92 ms. QTc 428 ms. ST segment and T wave changes: No evidence of acute ST segment elevation or depression Comparison: Previous tracing obtained on 01/03/2021 showed sinus bradycardia at a rate of 49 bpm.   IMAGING / PROCEDURES: MYOCARDIAL PERFUSION IMAGING STUDY (LEXISCAN) performed on 02/10/2021 Left ventricular function is normal LVEDP normal CT attenuation correction images showed aortic and coronary calcifications No evidence of stress-induced myocardial ischemia or arrhythmia Study was determined to be normal and low risk  TRANSTHORACIC ECHOCARDIOGRAM performed on 01/04/2021 Left ventricular ejection fraction, by estimation, is 60 to 65%. The left ventricle has normal function. The left ventricle has no regional wall motion abnormalities. Left ventricular diastolic parameters were  normal.  Right ventricular systolic function is normal. The right ventricular size is normal.  The mitral valve is normal in structure. No evidence of mitral valve regurgitation.  The aortic valve is normal in structure. Aortic valve regurgitation is not visualized. No aortic stenosis is present.  Possible left to right shunt seen by doppler.   BILATERAL CAROTID DOPPLER performed on 01/03/2021 Estimated 70-99% stenosis within the left ICA,  based on grayscale imaging and Doppler interrogation. Significant plaque within the proximal left ICA on grayscale imaging without evidence of calcification or posterior shadowing. Follow-up CTA may be useful for further evaluation. Estimated 0-49% stenosis within the right ICA. Antegrade flow within the bilateral vertebral arteries.  MRI ANGIO HEAD WO CONTRAST performed on 01/03/2021 Few small foci of acute infarction affecting the cortex to the brain in the left posterior frontal vertex. No hemorrhage or mass effect. Extensive chronic small-vessel ischemic changes elsewhere throughout the cerebral hemispheric white matter.  Question incidental punctate acute white matter infarction just posterior to the atrium of the left lateral ventricle. No intracranial large vessel occlusion.  Atherosclerotic narrowing and irregularity of the more distal MCA branches.  Focal stenosis of the right M2 branch inferior division.  Severe atherosclerotic disease of both PCAs with severe stenoses.   Impression and Plan:  STONE SPIRITO has been referred for pre-anesthesia review and clearance prior to him undergoing the planned anesthetic and procedural courses. Available labs, pertinent testing, and imaging results were personally  reviewed by me. This patient has been appropriately cleared by cardiology with an overall ACCEPTABLE risk of significant perioperative cardiovascular complications.  Based on clinical review performed today (02/14/21), barring any significant acute changes in the patient's overall condition, it is anticipated that he will be able to proceed with the planned surgical intervention. Any acute changes in clinical condition may necessitate his procedure being postponed and/or cancelled. Patient will meet with anesthesia team (MD and/or CRNA) on the day of his procedure for preoperative evaluation/assessment. Questions regarding anesthetic course will be fielded at that time.   Pre-surgical  instructions were reviewed with the patient during his PAT appointment and questions were fielded by PAT clinical staff. Patient was advised that if any questions or concerns arise prior to his procedure then he should return a call to PAT and/or his surgeon's office to discuss.  Honor Loh, MSN, APRN, FNP-C, CEN [facility]  Peri-operative Services Nurse Practitioner Phone: [phone] Fax: [phone] 02/14/21 11:00 AM  NOTE: This note has been prepared using Dragon dictation software. Despite my best ability to proofread, there is always the potential that unintentional transcriptional errors may still occur from this process.

## 2021-02-14 NOTE — Patient Instructions (Addendum)
Your procedure is scheduled on: 02/19/21 Wednesday Report to the Registration Desk on the 1st floor of the [facility]. To find out your arrival time, please call [phone] between 1PM - 3PM on: 02/18/21 - Tuesday Report to [facility] for Lab and Covid Test on 02/17/21 at 8:30 am.  REMEMBER: Instructions that are not followed completely may result in serious medical risk, up to and including death; or upon the discretion of your surgeon and anesthesiologist your surgery may need to be rescheduled.  Do not eat food after midnight the night before surgery.  No gum chewing, lozengers or hard candies.  You may however, drink CLEAR liquids up to 2 hours before you are scheduled to arrive for your surgery. Do not drink anything within 2 hours of your scheduled arrival time.  Clear liquids include: - water  - apple juice without pulp - gatorade (not RED, PURPLE, OR BLUE) - black coffee or tea (Do NOT add milk or creamers to the coffee or tea) Do NOT drink anything that is not on this list.  Type 1 and Type 2 diabetics should only drink water.  In addition, your doctor has ordered for you to drink the provided  Ensure Pre-Surgery Clear Carbohydrate Drink  Gatorade G2 Drinking this carbohydrate drink up to two hours before surgery helps to reduce insulin resistance and improve patient outcomes. Please complete drinking 2 hours prior to scheduled arrival time.  TAKE THESE MEDICATIONS THE MORNING OF SURGERY WITH A SIP OF WATER: NONE  Follow recommendations from Cardiologist, Pulmonologist or PCP regarding stopping Aspirin, Coumadin, Plavix, Eliquis, Pradaxa, or Pletal. Stop taking Plavix 5 days prior to your procedure.  One week prior to surgery: Stop Anti-inflammatories (NSAIDS) such as Advil, Aleve, Ibuprofen, Motrin, Naproxen, Naprosyn and Aspirin based products such as Excedrin, Goodys Powder, BC Powder.  Stop ANY OVER THE COUNTER supplements until after surgery.  You may  however, continue to take Tylenol if needed for pain up until the day of surgery.  No Alcohol for 24 hours before or after surgery.  No Smoking including e-cigarettes for 24 hours prior to surgery.  No chewable tobacco products for at least 6 hours prior to surgery.  No nicotine patches on the day of surgery.  Do not use any "recreational" drugs for at least a week prior to your surgery.  Please be advised that the combination of cocaine and anesthesia may have negative outcomes, up to and including death. If you test positive for cocaine, your surgery will be cancelled.  On the morning of surgery brush your teeth with toothpaste and water, you may rinse your mouth with mouthwash if you wish. Do not swallow any toothpaste or mouthwash.  Use CHG Soap or wipes as directed on instruction sheet.  Do not wear jewelry, make-up, hairpins, clips or nail polish.  Do not wear lotions, powders, or perfumes.   Do not shave body from the neck down 48 hours prior to surgery just in case you cut yourself which could leave a site for infection.  Also, freshly shaved skin may become irritated if using the CHG soap.  Contact lenses, hearing aids and dentures may not be worn into surgery.  Do not bring valuables to the hospital. [facility] is not responsible for any missing/lost belongings or valuables.   Notify your doctor if there is any change in your medical condition (cold, fever, infection).  Wear comfortable clothing (specific to your surgery type) to the hospital.  After surgery, you can  help prevent lung complications by doing breathing exercises.  Take deep breaths and cough every 1-2 hours. Your doctor may order a device called an Incentive Spirometer to help you take deep breaths. When coughing or sneezing, hold a pillow firmly against your incision with both hands. This is called "splinting." Doing this helps protect your incision. It also decreases belly discomfort.  If you are being  admitted to the hospital overnight, leave your suitcase in the car. After surgery it may be brought to your room.  If you are being discharged the day of surgery, you will not be allowed to drive home. You will need a responsible adult (18 years or older) to drive you home and stay with you that night.   If you are taking public transportation, you will need to have a responsible adult (18 years or older) with you. Please confirm with your physician that it is acceptable to use public transportation.   Please call the [facility] at [phone] if you have any questions about these instructions.  Surgery Visitation Policy:  Patients undergoing a surgery or procedure may have one family member or support person with them as long as that person is not COVID-19 positive or experiencing its symptoms.  That person may remain in the waiting area during the procedure and may rotate out with other people.  Inpatient Visitation:    Visiting hours are 7 a.m. to 8 p.m. Up to two visitors ages 16+ are allowed at one time in a patient room. The visitors may rotate out with other people during the day. Visitors must check out when they leave, or other visitors will not be allowed. One designated support person may remain overnight. The visitor must pass COVID-19 screenings, use hand sanitizer when entering and exiting the patient's room and wear a mask at all times, including in the patient's room. Patients must also wear a mask when staff or their visitor are in the room. Masking is required regardless of vaccination status.

## 2021-02-17 ENCOUNTER — Other Ambulatory Visit: Payer: Self-pay

## 2021-02-17 ENCOUNTER — Encounter: Payer: Self-pay | Admitting: Urgent Care

## 2021-02-17 ENCOUNTER — Other Ambulatory Visit
Admission: RE | Admit: 2021-02-17 | Discharge: 2021-02-17 | Disposition: A | Discharge status: Home / Self Care | Payer: Medicare Other | Source: Ambulatory Visit | Attending: Vascular Surgery | Admitting: Vascular Surgery

## 2021-02-17 DIAGNOSIS — Z01812 Encounter for preprocedural laboratory examination: Secondary | ICD-10-CM | POA: Insufficient documentation

## 2021-02-17 DIAGNOSIS — I63239 Cerebral infarction due to unspecified occlusion or stenosis of unspecified carotid arteries: Secondary | ICD-10-CM

## 2021-02-17 DIAGNOSIS — E785 Hyperlipidemia, unspecified: Secondary | ICD-10-CM | POA: Diagnosis not present

## 2021-02-17 DIAGNOSIS — I2510 Atherosclerotic heart disease of native coronary artery without angina pectoris: Secondary | ICD-10-CM | POA: Diagnosis not present

## 2021-02-17 DIAGNOSIS — Z888 Allergy status to other drugs, medicaments and biological substances status: Secondary | ICD-10-CM | POA: Diagnosis not present

## 2021-02-17 DIAGNOSIS — Z7982 Long term (current) use of aspirin: Secondary | ICD-10-CM | POA: Diagnosis not present

## 2021-02-17 DIAGNOSIS — Z87442 Personal history of urinary calculi: Secondary | ICD-10-CM | POA: Diagnosis not present

## 2021-02-17 DIAGNOSIS — Z7902 Long term (current) use of antithrombotics/antiplatelets: Secondary | ICD-10-CM | POA: Diagnosis not present

## 2021-02-17 DIAGNOSIS — I6522 Occlusion and stenosis of left carotid artery: Secondary | ICD-10-CM | POA: Diagnosis not present

## 2021-02-17 DIAGNOSIS — Z20822 Contact with and (suspected) exposure to covid-19: Secondary | ICD-10-CM | POA: Insufficient documentation

## 2021-02-17 DIAGNOSIS — Z8673 Personal history of transient ischemic attack (TIA), and cerebral infarction without residual deficits: Secondary | ICD-10-CM | POA: Diagnosis not present

## 2021-02-17 DIAGNOSIS — I129 Hypertensive chronic kidney disease with stage 1 through stage 4 chronic kidney disease, or unspecified chronic kidney disease: Secondary | ICD-10-CM | POA: Diagnosis not present

## 2021-02-17 DIAGNOSIS — Z8249 Family history of ischemic heart disease and other diseases of the circulatory system: Secondary | ICD-10-CM | POA: Diagnosis not present

## 2021-02-17 DIAGNOSIS — M1990 Unspecified osteoarthritis, unspecified site: Secondary | ICD-10-CM | POA: Diagnosis not present

## 2021-02-17 DIAGNOSIS — Z823 Family history of stroke: Secondary | ICD-10-CM | POA: Diagnosis not present

## 2021-02-17 DIAGNOSIS — I700 Atherosclerosis of aorta: Secondary | ICD-10-CM | POA: Diagnosis not present

## 2021-02-17 DIAGNOSIS — Z85828 Personal history of other malignant neoplasm of skin: Secondary | ICD-10-CM | POA: Diagnosis not present

## 2021-02-17 DIAGNOSIS — N183 Chronic kidney disease, stage 3 unspecified: Secondary | ICD-10-CM | POA: Diagnosis not present

## 2021-02-17 LAB — TYPE AND SCREEN
ABO/RH(D): O POS
Antibody Screen: NEGATIVE

## 2021-02-18 LAB — SARS CORONAVIRUS 2 (TAT 6-24 HRS): SARS Coronavirus 2: NEGATIVE

## 2021-02-19 ENCOUNTER — Encounter: Payer: Self-pay | Admitting: Vascular Surgery

## 2021-02-19 ENCOUNTER — Other Ambulatory Visit: Payer: Self-pay

## 2021-02-19 ENCOUNTER — Inpatient Hospital Stay: Payer: Medicare Other | Admitting: Urgent Care

## 2021-02-19 ENCOUNTER — Encounter
Admission: RE | Disposition: A | Discharge status: Home / Self Care | Payer: Self-pay | Source: Home / Self Care | Attending: Vascular Surgery

## 2021-02-19 ENCOUNTER — Inpatient Hospital Stay
Admission: RE | Admit: 2021-02-19 | Discharge: 2021-02-20 | DRG: 039 | Disposition: A | Discharge status: Home / Self Care | Payer: Medicare Other | Attending: Vascular Surgery | Admitting: Vascular Surgery

## 2021-02-19 DIAGNOSIS — I6522 Occlusion and stenosis of left carotid artery: Secondary | ICD-10-CM | POA: Diagnosis not present

## 2021-02-19 DIAGNOSIS — Z8673 Personal history of transient ischemic attack (TIA), and cerebral infarction without residual deficits: Secondary | ICD-10-CM | POA: Diagnosis not present

## 2021-02-19 DIAGNOSIS — Z823 Family history of stroke: Secondary | ICD-10-CM

## 2021-02-19 DIAGNOSIS — Z8546 Personal history of malignant neoplasm of prostate: Secondary | ICD-10-CM

## 2021-02-19 DIAGNOSIS — I129 Hypertensive chronic kidney disease with stage 1 through stage 4 chronic kidney disease, or unspecified chronic kidney disease: Secondary | ICD-10-CM | POA: Diagnosis not present

## 2021-02-19 DIAGNOSIS — Z20822 Contact with and (suspected) exposure to covid-19: Secondary | ICD-10-CM | POA: Diagnosis present

## 2021-02-19 DIAGNOSIS — Z87442 Personal history of urinary calculi: Secondary | ICD-10-CM

## 2021-02-19 DIAGNOSIS — Z888 Allergy status to other drugs, medicaments and biological substances status: Secondary | ICD-10-CM

## 2021-02-19 DIAGNOSIS — I700 Atherosclerosis of aorta: Secondary | ICD-10-CM | POA: Diagnosis present

## 2021-02-19 DIAGNOSIS — I2510 Atherosclerotic heart disease of native coronary artery without angina pectoris: Secondary | ICD-10-CM | POA: Diagnosis present

## 2021-02-19 DIAGNOSIS — Z85828 Personal history of other malignant neoplasm of skin: Secondary | ICD-10-CM | POA: Diagnosis not present

## 2021-02-19 DIAGNOSIS — E785 Hyperlipidemia, unspecified: Secondary | ICD-10-CM | POA: Diagnosis not present

## 2021-02-19 DIAGNOSIS — N183 Chronic kidney disease, stage 3 unspecified: Secondary | ICD-10-CM | POA: Diagnosis present

## 2021-02-19 DIAGNOSIS — Z7902 Long term (current) use of antithrombotics/antiplatelets: Secondary | ICD-10-CM | POA: Diagnosis not present

## 2021-02-19 DIAGNOSIS — F419 Anxiety disorder, unspecified: Secondary | ICD-10-CM | POA: Diagnosis present

## 2021-02-19 DIAGNOSIS — M1990 Unspecified osteoarthritis, unspecified site: Secondary | ICD-10-CM | POA: Diagnosis present

## 2021-02-19 DIAGNOSIS — Z8249 Family history of ischemic heart disease and other diseases of the circulatory system: Secondary | ICD-10-CM | POA: Diagnosis not present

## 2021-02-19 DIAGNOSIS — I739 Peripheral vascular disease, unspecified: Secondary | ICD-10-CM | POA: Diagnosis not present

## 2021-02-19 DIAGNOSIS — Z7982 Long term (current) use of aspirin: Secondary | ICD-10-CM | POA: Diagnosis not present

## 2021-02-19 DIAGNOSIS — N1831 Chronic kidney disease, stage 3a: Secondary | ICD-10-CM | POA: Diagnosis not present

## 2021-02-19 HISTORY — PX: ENDARTERECTOMY: SHX5162

## 2021-02-19 HISTORY — DX: Atherosclerosis of aorta: I70.0

## 2021-02-19 HISTORY — DX: Occlusion and stenosis of bilateral carotid arteries: I65.23

## 2021-02-19 HISTORY — DX: Chronic kidney disease, stage 3 unspecified: N18.30

## 2021-02-19 HISTORY — DX: Long term (current) use of antithrombotics/antiplatelets: Z79.02

## 2021-02-19 LAB — ABO/RH: ABO/RH(D): O POS

## 2021-02-19 LAB — GLUCOSE, CAPILLARY: Glucose-Capillary: 158 mg/dL — ABNORMAL HIGH (ref 70–99)

## 2021-02-19 LAB — MRSA NEXT GEN BY PCR, NASAL: MRSA by PCR Next Gen: NOT DETECTED

## 2021-02-19 SURGERY — ENDARTERECTOMY, CAROTID
Anesthesia: General | Laterality: Left

## 2021-02-19 MED ORDER — SODIUM CHLORIDE 0.9 % IV SOLN: Freq: Once | INTRAVENOUS | Status: DC

## 2021-02-19 MED ORDER — SUGAMMADEX SODIUM 200 MG/2ML IV SOLN
INTRAVENOUS | Status: DC | PRN
  Administered 2021-02-19: 200 mg via INTRAVENOUS

## 2021-02-19 MED ORDER — LACTATED RINGERS IV SOLN: INTRAVENOUS | Status: DC

## 2021-02-19 MED ORDER — HEPARIN SODIUM (PORCINE) 1000 UNIT/ML IJ SOLN
INTRAMUSCULAR | Status: AC
  Filled 2021-02-19: qty 10

## 2021-02-19 MED ORDER — PHENYLEPHRINE HCL-NACL 20-0.9 MG/250ML-% IV SOLN
INTRAVENOUS | Status: DC | PRN
  Administered 2021-02-19: 80 ug/min via INTRAVENOUS

## 2021-02-19 MED ORDER — EPHEDRINE 5 MG/ML INJ
INTRAVENOUS | Status: AC
  Filled 2021-02-19: qty 5

## 2021-02-19 MED ORDER — ONDANSETRON HCL 4 MG/2ML IJ SOLN: 4.0000 mg | Freq: Four times a day (QID) | INTRAMUSCULAR | Status: DC | PRN

## 2021-02-19 MED ORDER — PHENYLEPHRINE HCL-NACL 20-0.9 MG/250ML-% IV SOLN
INTRAVENOUS | Status: AC
  Filled 2021-02-19: qty 250

## 2021-02-19 MED ORDER — OXYCODONE HCL 5 MG/5ML PO SOLN: 5.0000 mg | Freq: Once | ORAL | Status: DC | PRN

## 2021-02-19 MED ORDER — ASPIRIN EC 81 MG PO TBEC
81.0000 mg | DELAYED_RELEASE_TABLET | Freq: Every day | ORAL | Status: DC
  Administered 2021-02-19 – 2021-02-20 (×2): 81 mg via ORAL
  Filled 2021-02-19 (×2): qty 1

## 2021-02-19 MED ORDER — LIDOCAINE HCL (PF) 1 % IJ SOLN
INTRAMUSCULAR | Status: AC
  Filled 2021-02-19: qty 30

## 2021-02-19 MED ORDER — FAMOTIDINE 20 MG PO TABS: 20.0000 mg | ORAL_TABLET | Freq: Once | ORAL | Status: AC

## 2021-02-19 MED ORDER — ACETAMINOPHEN 10 MG/ML IV SOLN: 1000.0000 mg | Freq: Once | INTRAVENOUS | Status: DC | PRN

## 2021-02-19 MED ORDER — ACETAMINOPHEN 650 MG RE SUPP: 325.0000 mg | RECTAL | Status: DC | PRN

## 2021-02-19 MED ORDER — SODIUM CHLORIDE 0.9 % IV SOLN: 500.0000 mL | Freq: Once | INTRAVENOUS | Status: DC | PRN

## 2021-02-19 MED ORDER — ALUM & MAG HYDROXIDE-SIMETH 200-200-20 MG/5ML PO SUSP: 15.0000 mL | ORAL | Status: DC | PRN

## 2021-02-19 MED ORDER — PHENYLEPHRINE HCL-NACL 20-0.9 MG/250ML-% IV SOLN
INTRAVENOUS | Status: AC
  Filled 2021-02-19: qty 500

## 2021-02-19 MED ORDER — PANTOPRAZOLE SODIUM 40 MG IV SOLR
40.0000 mg | Freq: Every day | INTRAVENOUS | Status: DC
  Administered 2021-02-19: 40 mg via INTRAVENOUS
  Filled 2021-02-19: qty 40

## 2021-02-19 MED ORDER — OXYCODONE-ACETAMINOPHEN 5-325 MG PO TABS: 1.0000 | ORAL_TABLET | ORAL | Status: DC | PRN

## 2021-02-19 MED ORDER — EZETIMIBE 10 MG PO TABS
10.0000 mg | ORAL_TABLET | Freq: Every day | ORAL | Status: DC
  Filled 2021-02-19: qty 1

## 2021-02-19 MED ORDER — ACETAMINOPHEN 10 MG/ML IV SOLN
INTRAVENOUS | Status: DC | PRN
  Administered 2021-02-19: 1000 mg via INTRAVENOUS

## 2021-02-19 MED ORDER — GUAIFENESIN-DM 100-10 MG/5ML PO SYRP: 15.0000 mL | ORAL_SOLUTION | ORAL | Status: DC | PRN

## 2021-02-19 MED ORDER — CEFAZOLIN SODIUM-DEXTROSE 2-4 GM/100ML-% IV SOLN
INTRAVENOUS | Status: AC
  Filled 2021-02-19: qty 100

## 2021-02-19 MED ORDER — HEPARIN 5000 UNITS IN NS 1000 ML (FLUSH)
INTRAMUSCULAR | Status: DC | PRN
  Administered 2021-02-19: 100 mL via INTRAMUSCULAR
  Administered 2021-02-19: 50 mL via INTRAMUSCULAR

## 2021-02-19 MED ORDER — ALPRAZOLAM 0.5 MG PO TABS
0.5000 mg | ORAL_TABLET | Freq: Every evening | ORAL | Status: DC | PRN
  Administered 2021-02-19: 0.5 mg via ORAL
  Filled 2021-02-19: qty 1

## 2021-02-19 MED ORDER — CHLORHEXIDINE GLUCONATE CLOTH 2 % EX PADS: 6.0000 | MEDICATED_PAD | Freq: Once | CUTANEOUS | Status: DC

## 2021-02-19 MED ORDER — DROPERIDOL 2.5 MG/ML IJ SOLN
0.6250 mg | Freq: Once | INTRAMUSCULAR | Status: DC | PRN
  Filled 2021-02-19: qty 2

## 2021-02-19 MED ORDER — CEFAZOLIN SODIUM-DEXTROSE 2-4 GM/100ML-% IV SOLN
2.0000 g | Freq: Three times a day (TID) | INTRAVENOUS | Status: AC
  Administered 2021-02-19 – 2021-02-20 (×2): 2 g via INTRAVENOUS
  Filled 2021-02-19 (×3): qty 100

## 2021-02-19 MED ORDER — PROMETHAZINE HCL 25 MG/ML IJ SOLN: 6.2500 mg | INTRAMUSCULAR | Status: DC | PRN

## 2021-02-19 MED ORDER — MORPHINE SULFATE (PF) 2 MG/ML IV SOLN: 2.0000 mg | INTRAVENOUS | Status: DC | PRN

## 2021-02-19 MED ORDER — OXYCODONE HCL 5 MG PO TABS: 5.0000 mg | ORAL_TABLET | Freq: Once | ORAL | Status: DC | PRN

## 2021-02-19 MED ORDER — METOPROLOL TARTRATE 5 MG/5ML IV SOLN
2.0000 mg | INTRAVENOUS | Status: DC | PRN
Start: 2021-02-19 — End: 2021-02-20

## 2021-02-19 MED ORDER — FENTANYL CITRATE (PF) 100 MCG/2ML IJ SOLN
INTRAMUSCULAR | Status: AC
  Administered 2021-02-19: 25 ug via INTRAVENOUS
  Filled 2021-02-19: qty 2

## 2021-02-19 MED ORDER — PHENYLEPHRINE 40 MCG/ML (10ML) SYRINGE FOR IV PUSH (FOR BLOOD PRESSURE SUPPORT)
PREFILLED_SYRINGE | INTRAVENOUS | Status: DC | PRN
Start: 2021-02-19 — End: 2021-02-19
  Administered 2021-02-19 (×4): 40 ug via INTRAVENOUS
  Administered 2021-02-19: 80 ug via INTRAVENOUS
  Administered 2021-02-19 (×3): 40 ug via INTRAVENOUS
  Administered 2021-02-19: 160 ug via INTRAVENOUS
  Administered 2021-02-19: 80 ug via INTRAVENOUS
  Administered 2021-02-19 (×2): 40 ug via INTRAVENOUS

## 2021-02-19 MED ORDER — CHLORHEXIDINE GLUCONATE 0.12 % MT SOLN: 15.0000 mL | Freq: Once | OROMUCOSAL | Status: AC

## 2021-02-19 MED ORDER — ATORVASTATIN CALCIUM 20 MG PO TABS
20.0000 mg | ORAL_TABLET | ORAL | Status: DC
  Administered 2021-02-20: 20 mg via ORAL
  Filled 2021-02-19: qty 1

## 2021-02-19 MED ORDER — PROPOFOL 10 MG/ML IV BOLUS
INTRAVENOUS | Status: DC | PRN
  Administered 2021-02-19: 20 mg via INTRAVENOUS
  Administered 2021-02-19: 30 mg via INTRAVENOUS
  Administered 2021-02-19: 10 mg via INTRAVENOUS
  Administered 2021-02-19 (×2): 20 mg via INTRAVENOUS
  Administered 2021-02-19: 70 mg via INTRAVENOUS

## 2021-02-19 MED ORDER — FENTANYL CITRATE (PF) 100 MCG/2ML IJ SOLN
INTRAMUSCULAR | Status: DC | PRN
  Administered 2021-02-19: 25 ug via INTRAVENOUS
  Administered 2021-02-19 (×2): 50 ug via INTRAVENOUS
  Administered 2021-02-19: 25 ug via INTRAVENOUS

## 2021-02-19 MED ORDER — FENTANYL CITRATE (PF) 100 MCG/2ML IJ SOLN
25.0000 ug | INTRAMUSCULAR | Status: DC | PRN
  Administered 2021-02-19: 25 ug via INTRAVENOUS

## 2021-02-19 MED ORDER — FENTANYL CITRATE (PF) 100 MCG/2ML IJ SOLN
INTRAMUSCULAR | Status: AC
  Filled 2021-02-19: qty 2

## 2021-02-19 MED ORDER — LIDOCAINE HCL (CARDIAC) PF 100 MG/5ML IV SOSY
PREFILLED_SYRINGE | INTRAVENOUS | Status: DC | PRN
  Administered 2021-02-19: 60 mg via INTRAVENOUS

## 2021-02-19 MED ORDER — DOCUSATE SODIUM 100 MG PO CAPS
100.0000 mg | ORAL_CAPSULE | Freq: Every day | ORAL | Status: DC
  Administered 2021-02-20: 100 mg via ORAL
  Filled 2021-02-19: qty 1

## 2021-02-19 MED ORDER — HEMOSTATIC AGENTS (NO CHARGE) OPTIME
TOPICAL | Status: DC | PRN
  Administered 2021-02-19: 1 via TOPICAL

## 2021-02-19 MED ORDER — HEPARIN SODIUM (PORCINE) 1000 UNIT/ML IJ SOLN
INTRAMUSCULAR | Status: DC | PRN
  Administered 2021-02-19: 8000 [IU] via INTRAVENOUS

## 2021-02-19 MED ORDER — ACETAMINOPHEN 10 MG/ML IV SOLN
INTRAVENOUS | Status: AC
  Filled 2021-02-19: qty 100

## 2021-02-19 MED ORDER — SODIUM CHLORIDE 0.9 % IV SOLN: INTRAVENOUS | Status: AC

## 2021-02-19 MED ORDER — EPHEDRINE SULFATE 50 MG/ML IJ SOLN
INTRAMUSCULAR | Status: DC | PRN
  Administered 2021-02-19: 5 mg via INTRAVENOUS

## 2021-02-19 MED ORDER — LABETALOL HCL 5 MG/ML IV SOLN: 10.0000 mg | INTRAVENOUS | Status: DC | PRN

## 2021-02-19 MED ORDER — SEVOFLURANE IN SOLN
RESPIRATORY_TRACT | Status: AC
  Filled 2021-02-19: qty 250

## 2021-02-19 MED ORDER — SUCCINYLCHOLINE CHLORIDE 200 MG/10ML IV SOSY
PREFILLED_SYRINGE | INTRAVENOUS | Status: AC
  Filled 2021-02-19: qty 20

## 2021-02-19 MED ORDER — CHLORHEXIDINE GLUCONATE 0.12 % MT SOLN
OROMUCOSAL | Status: AC
  Administered 2021-02-19: 15 mL via OROMUCOSAL
  Filled 2021-02-19: qty 15

## 2021-02-19 MED ORDER — CEFAZOLIN SODIUM-DEXTROSE 2-4 GM/100ML-% IV SOLN
2.0000 g | INTRAVENOUS | Status: AC
  Administered 2021-02-19: 2 g via INTRAVENOUS

## 2021-02-19 MED ORDER — NITROGLYCERIN IN D5W 200-5 MCG/ML-% IV SOLN
INTRAVENOUS | Status: AC
  Filled 2021-02-19: qty 250

## 2021-02-19 MED ORDER — VASOPRESSIN 20 UNIT/ML IV SOLN
INTRAVENOUS | Status: AC
  Filled 2021-02-19: qty 1

## 2021-02-19 MED ORDER — HYDROMORPHONE HCL 1 MG/ML IJ SOLN: 1.0000 mg | Freq: Once | INTRAMUSCULAR | Status: DC | PRN

## 2021-02-19 MED ORDER — CHLORHEXIDINE GLUCONATE CLOTH 2 % EX PADS
6.0000 | MEDICATED_PAD | Freq: Every day | CUTANEOUS | Status: DC
  Administered 2021-02-19: 6 via TOPICAL

## 2021-02-19 MED ORDER — SUCCINYLCHOLINE CHLORIDE 200 MG/10ML IV SOSY
PREFILLED_SYRINGE | INTRAVENOUS | Status: AC
  Filled 2021-02-19: qty 10

## 2021-02-19 MED ORDER — ORAL CARE MOUTH RINSE
15.0000 mL | Freq: Two times a day (BID) | OROMUCOSAL | Status: DC
  Administered 2021-02-19 – 2021-02-20 (×3): 15 mL via OROMUCOSAL

## 2021-02-19 MED ORDER — ROCURONIUM BROMIDE 100 MG/10ML IV SOLN
INTRAVENOUS | Status: DC | PRN
  Administered 2021-02-19: 20 mg via INTRAVENOUS
  Administered 2021-02-19: 10 mg via INTRAVENOUS
  Administered 2021-02-19: 40 mg via INTRAVENOUS

## 2021-02-19 MED ORDER — ALBUMIN HUMAN 5 % IV SOLN
INTRAVENOUS | Status: AC
  Filled 2021-02-19: qty 250

## 2021-02-19 MED ORDER — PHENOL 1.4 % MT LIQD
1.0000 | OROMUCOSAL | Status: DC | PRN
  Filled 2021-02-19: qty 177

## 2021-02-19 MED ORDER — FAMOTIDINE 20 MG PO TABS
ORAL_TABLET | ORAL | Status: AC
  Administered 2021-02-19: 20 mg via ORAL
  Filled 2021-02-19: qty 1

## 2021-02-19 MED ORDER — HYDRALAZINE HCL 20 MG/ML IJ SOLN: 5.0000 mg | INTRAMUSCULAR | Status: DC | PRN

## 2021-02-19 MED ORDER — FIBRIN SEALANT 2 ML SINGLE DOSE KIT
PACK | CUTANEOUS | Status: DC | PRN
  Administered 2021-02-19: 2 mL via TOPICAL

## 2021-02-19 MED ORDER — ROCURONIUM BROMIDE 10 MG/ML (PF) SYRINGE
PREFILLED_SYRINGE | INTRAVENOUS | Status: AC
  Filled 2021-02-19: qty 10

## 2021-02-19 MED ORDER — NITROPRUSSIDE SODIUM-NACL 20-0.9 MG/100ML-% IV SOLN
INTRAVENOUS | Status: DC | PRN
  Administered 2021-02-19 (×2): 10 ug via INTRAVENOUS

## 2021-02-19 MED ORDER — SODIUM CHLORIDE 0.9 % IV SOLN: INTRAVENOUS | Status: DC

## 2021-02-19 MED ORDER — ONDANSETRON HCL 4 MG/2ML IJ SOLN
INTRAMUSCULAR | Status: DC | PRN
  Administered 2021-02-19: 4 mg via INTRAVENOUS

## 2021-02-19 MED ORDER — ACETAMINOPHEN 325 MG PO TABS
325.0000 mg | ORAL_TABLET | ORAL | Status: DC | PRN
  Administered 2021-02-19: 650 mg via ORAL
  Administered 2021-02-20: 325 mg via ORAL
  Filled 2021-02-19 (×3): qty 1
  Filled 2021-02-19: qty 2

## 2021-02-19 MED ORDER — ORAL CARE MOUTH RINSE: 15.0000 mL | Freq: Once | OROMUCOSAL | Status: AC

## 2021-02-19 MED ORDER — DEXAMETHASONE SODIUM PHOSPHATE 10 MG/ML IJ SOLN
INTRAMUSCULAR | Status: DC | PRN
  Administered 2021-02-19: 10 mg via INTRAVENOUS

## 2021-02-19 MED ORDER — MIDAZOLAM HCL 2 MG/2ML IJ SOLN
INTRAMUSCULAR | Status: AC
  Filled 2021-02-19: qty 2

## 2021-02-19 MED ORDER — KCL IN DEXTROSE-NACL 20-5-0.9 MEQ/L-%-% IV SOLN
INTRAVENOUS | Status: DC
  Filled 2021-02-19 (×3): qty 1000

## 2021-02-19 MED ORDER — LOSARTAN POTASSIUM 50 MG PO TABS
50.0000 mg | ORAL_TABLET | Freq: Every day | ORAL | Status: DC
  Administered 2021-02-19 – 2021-02-20 (×2): 50 mg via ORAL
  Filled 2021-02-19 (×2): qty 1

## 2021-02-19 SURGICAL SUPPLY — 62 items
ADH SKN CLS APL DERMABOND .7 (GAUZE/BANDAGES/DRESSINGS) ×2
APL PRP STRL LF DISP 70% ISPRP (MISCELLANEOUS) ×2
BAG DECANTER FOR FLEXI CONT (MISCELLANEOUS) ×2 IMPLANT
BLADE SURG 15 STRL LF DISP TIS (BLADE) ×1 IMPLANT
BLADE SURG 15 STRL SS (BLADE) ×2
BLADE SURG SZ11 CARB STEEL (BLADE) ×2 IMPLANT
BOOT SUTURE AID YELLOW STND (SUTURE) ×4 IMPLANT
BRUSH SCRUB EZ  4% CHG (MISCELLANEOUS) ×1
BRUSH SCRUB EZ 4% CHG (MISCELLANEOUS) ×1 IMPLANT
CHLORAPREP W/TINT 26 (MISCELLANEOUS) ×4 IMPLANT
DERMABOND ADVANCED (GAUZE/BANDAGES/DRESSINGS) ×2
DERMABOND ADVANCED .7 DNX12 (GAUZE/BANDAGES/DRESSINGS) ×2 IMPLANT
DRAPE 3/4 80X56 (DRAPES) ×2 IMPLANT
DRAPE INCISE IOBAN 66X45 STRL (DRAPES) ×2 IMPLANT
DRAPE LAPAROTOMY 100X77 ABD (DRAPES) ×2 IMPLANT
DRESSING SURGICEL FIBRLLR 1X2 (HEMOSTASIS) ×1 IMPLANT
DRSG SURGICEL FIBRILLAR 1X2 (HEMOSTASIS) ×2
ELECT CAUTERY BLADE 6.4 (BLADE) ×2 IMPLANT
ELECT REM PT RETURN 9FT ADLT (ELECTROSURGICAL) ×2
ELECTRODE REM PT RTRN 9FT ADLT (ELECTROSURGICAL) ×1 IMPLANT
GAUZE 4X4 16PLY ~~LOC~~+RFID DBL (SPONGE) ×2 IMPLANT
GLOVE SURG ENC MOIS LTX SZ7 (GLOVE) ×2 IMPLANT
GLOVE SURG SYN 8.0 (GLOVE) ×2 IMPLANT
GLOVE SURG UNDER LTX SZ7.5 (GLOVE) ×2 IMPLANT
GOWN STRL REUS W/ TWL LRG LVL3 (GOWN DISPOSABLE) ×1 IMPLANT
GOWN STRL REUS W/ TWL XL LVL3 (GOWN DISPOSABLE) ×2 IMPLANT
GOWN STRL REUS W/TWL LRG LVL3 (GOWN DISPOSABLE) ×2
GOWN STRL REUS W/TWL XL LVL3 (GOWN DISPOSABLE) ×4
HOLDER FOLEY CATH W/STRAP (MISCELLANEOUS) ×2 IMPLANT
IV NS 500ML (IV SOLUTION) ×2
IV NS 500ML BAXH (IV SOLUTION) ×1 IMPLANT
KIT TURNOVER KIT A (KITS) ×2 IMPLANT
LABEL OR SOLS (LABEL) ×2 IMPLANT
LOOP RED MAXI  1X406MM (MISCELLANEOUS) ×2
LOOP VESSEL MAXI 1X406 RED (MISCELLANEOUS) ×2 IMPLANT
LOOP VESSEL MINI 0.8X406 BLUE (MISCELLANEOUS) ×1 IMPLANT
LOOPS BLUE MINI 0.8X406MM (MISCELLANEOUS) ×1
MANIFOLD NEPTUNE II (INSTRUMENTS) ×2 IMPLANT
NEEDLE FILTER BLUNT 18X 1/2SAF (NEEDLE) ×1
NEEDLE FILTER BLUNT 18X1 1/2 (NEEDLE) ×1 IMPLANT
NEEDLE HYPO 25X1 1.5 SAFETY (NEEDLE) ×2 IMPLANT
NS IRRIG 500ML POUR BTL (IV SOLUTION) ×2 IMPLANT
PACK BASIN MAJOR ARMC (MISCELLANEOUS) ×2 IMPLANT
PATCH CAROTID ECM VASC 1X10 (Prosthesis & Implant Heart) ×2 IMPLANT
SHUNT CAROTID STR REINF 3.0X4. (MISCELLANEOUS) ×2 IMPLANT
SPONGE T-LAP 18X18 ~~LOC~~+RFID (SPONGE) ×4 IMPLANT
SUT MNCRL+ 5-0 UNDYED PC-3 (SUTURE) ×1 IMPLANT
SUT MONOCRYL 5-0 (SUTURE) ×1
SUT PROLENE 6 0 BV (SUTURE) ×24 IMPLANT
SUT PROLENE 7 0 BV 1 (SUTURE) ×8 IMPLANT
SUT SILK 2 0 (SUTURE) ×2
SUT SILK 2-0 18XBRD TIE 12 (SUTURE) ×1 IMPLANT
SUT SILK 3 0 (SUTURE) ×2
SUT SILK 3-0 18XBRD TIE 12 (SUTURE) ×1 IMPLANT
SUT SILK 4 0 (SUTURE) ×2
SUT SILK 4-0 18XBRD TIE 12 (SUTURE) ×1 IMPLANT
SUT VIC AB 3-0 SH 27 (SUTURE) ×2
SUT VIC AB 3-0 SH 27X BRD (SUTURE) ×1 IMPLANT
SYR 10ML LL (SYRINGE) ×2 IMPLANT
SYR 20ML LL LF (SYRINGE) ×2 IMPLANT
TRAY FOLEY MTR SLVR 16FR STAT (SET/KITS/TRAYS/PACK) ×2 IMPLANT
WATER STERILE IRR 500ML POUR (IV SOLUTION) ×2 IMPLANT

## 2021-02-19 NOTE — Progress Notes (Signed)
Patient awake/alet x4. Neuro intact, able to move x4 ext, hand grasps equal, able to push/pull bil lower ext.  Facial symmetrical, left radial aline intact, x2 PIV patent. Foley catheter patent. Waiting on bed available in ICU.

## 2021-02-19 NOTE — Anesthesia Procedure Notes (Signed)
Procedure Name: Intubation Date/Time: 02/19/2021 8:52 AM Performed by: Justin Mend, RN Pre-anesthesia Checklist: Patient identified, Emergency Drugs available, Suction available and Patient being monitored Patient Re-evaluated:Patient Re-evaluated prior to induction Oxygen Delivery Method: Circle system utilized Preoxygenation: Pre-oxygenation with 100% oxygen Induction Type: IV induction Ventilation: Mask ventilation without difficulty Laryngoscope Size: McGraph and 4 Grade View: Grade I Tube type: Oral Tube size: 7.5 mm Number of attempts: 1 Airway Equipment and Method: Stylet and Oral airway Placement Confirmation: ETT inserted through vocal cords under direct vision, positive ETCO2 and breath sounds checked- equal and bilateral Secured at: 22 cm Tube secured with: Tape Dental Injury: Teeth and Oropharynx as per pre-operative assessment

## 2021-02-19 NOTE — Plan of Care (Signed)
  Problem: Education: Goal: Knowledge of General Education information will improve Description: Including pain rating scale, medication(s)/side effects and non-pharmacologic comfort measures Outcome: Progressing   Problem: Clinical Measurements: Goal: Ability to maintain clinical measurements within normal limits will improve Outcome: Progressing Goal: Will remain free from infection Outcome: Progressing Goal: Respiratory complications will improve Outcome: Progressing   Problem: Activity: Goal: Risk for activity intolerance will decrease Outcome: Progressing   Problem: Nutrition: Goal: Adequate nutrition will be maintained Outcome: Progressing   Problem: Coping: Goal: Level of anxiety will decrease Outcome: Progressing   Problem: Elimination: Goal: Will not experience complications related to bowel motility Outcome: Progressing

## 2021-02-19 NOTE — Op Note (Signed)
VEIN AND VASCULAR SURGERY   OPERATIVE NOTE  PROCEDURE:   1.  Left carotid endarterectomy with CorMatrix arterial patch reconstruction  PRE-OPERATIVE DIAGNOSIS: 1.  Critical carotid stenosis   POST-OPERATIVE DIAGNOSIS: same as above   SURGEON: Katha Cabal, MD  ASSISTANT(S): Ms. Hezzie Bump  ANESTHESIA: general  ESTIMATED BLOOD LOSS: 100 cc  FINDING(S): 1.  Extensive calcified carotid plaque.  SPECIMEN(S):  Carotid plaque (sent to Pathology)  INDICATIONS:   Bobby Murillo is a 80 y.o. y.o. male who presents with left carotid stenosis of 95%.  The risks, benefits, and alternatives to carotid endarterectomy were discussed with the patient. The differences between carotid stenting and carotid endarterectomy were reviewed.  The patient voiced understanding and appears to be aware that the risks of carotid endarterectomy include but are not limited to: bleeding, infection, stroke, myocardial infarction, death, cranial nerve injuries both temporary and permanent, neck hematoma, possible airway compromise, labile blood pressure post-operatively, cerebral hyperperfusion syndrome, and possible need for additional interventions in the future. The patient is aware of the risks and agrees to proceed forward with the procedure.  DESCRIPTION: After full informed written consent was obtained from the patient, the patient was brought back to the operating room and placed supine upon the operating table.  Prior to induction, the patient received IV antibiotics.  After obtaining adequate anesthesia, the patient was placed a supine position with a shoulder roll in place and the patient's neck slightly hyperextended and rotated away from the surgical site.  The patient was prepped in the standard fashion for a carotid endarterectomy.    A first assistant was required to provide a safe and appropriate environment for executing the surgery.  The assistant was integral in providing  retraction, exposure, running suture providing suction and in the closing process.  The incision was made anterior to the sternocleidomastoid muscle and dissected down through the subcutaneous tissue.  The platysmas was opened with electrocautery.  The internal jugular vein and facial vein were identified.  The facial vein is ligated and divided between 2-0 silk ties.  The omohyoid was identified in the common carotid artery exposed at this level. The dissection was there in carried out along the carotid artery in a cranial direction.  The dissection was then carried along periadventitial plane along the common carotid artery up to the bifurcation. The external carotid artery was identified. Vessel loops were then placed around the external carotid artery as well as the superior thyroid artery. In the process of this dissection, the hypoglossal nerve was identified and protected from harm.  The internal carotid artery was then dissected circumferentially just beyond an area in the internal carotid artery distal to the plaque.    At this point, we gave the patient 7000 units of intravenous heparin.  After this was allowed to circulate for several minutes, the common carotid followed by the external carotid and then the internal carotid artery were clamped.  Arteriotomy was made in the common carotid artery with a 11 blade, and extended the arteriotomy with a Potts scissor down into the common carotid artery, then the arteriotomy was carried through the bifurcation into the internal carotid artery until I reached an area that was not diseased.  At this point, a Sundt shunt was placed.  The endarterectomy was begun in the common carotid artery with a Garment/textile technologist and carried this dissection down into the common carotid artery circumferentially.  Then I transected the plaque at a segment where it was adherent  and transected the plaque with Potts scissors.  I then carried this dissection up into the external  carotid artery.  The plaque was extracted by unclamping the external carotid artery and performing an eversion endarterectomy.  The dissection was then carried into the internal carotid artery where a  feathered end point was created.  The plaque was passed off the field as a specimen.  The distal endpoint was tacked down with 6 interrupted 7-0 Prolene sutures.  A CorMatrix arterial patch was delivered onto the field and trimmed appropriately for the artery and sewed in place with 6-0 Prolene using a 4 quadrant technique.  The medial suture line was completed and the lateral suture line was run approximately one quarter the length of the arteriotomy.  Prior to completing this patch angioplasty, the shunt was removed, the internal carotid artery was flushed and there was excellent backbleeding.  The carotid artery repair was flushed with heparinized saline and then the patch angioplasty was completed in the usual fashion.  The flow was then reestablished first to the external carotid artery and then the internal carotid artery to prevent distal embolization.   Several minutes of pressure were held and 6-0 Prolene patch sutures were used as need for hemostasis.  At this point, I placed Surgicel and Evicel topical hemostatic agents.  There was no more active bleeding in the surgical site.  The sternocleidomastoid space was closed with three interrupted 3-0 Vicryl sutures. I then reapproximated the platysma muscle with a running stitch of 3-0 Vicryl.  The skin was then closed with a running subcuticular 4-0 Monocryl.  The skin was then cleaned, dried and Dermabond was used to reinforce the skin closure.  The patient awakened and was taken to the recovery room in stable condition, following commands and moving all four extremities without any apparent deficits.    COMPLICATIONS: none  CONDITION: stable  Bobby Murillo 02/19/2021<11:37 AM

## 2021-02-19 NOTE — Transfer of Care (Signed)
Immediate Anesthesia Transfer of Care Note  Patient: Bobby Murillo  Procedure(s) Performed: ENDARTERECTOMY CAROTID (Left)  Patient Location: PACU  Anesthesia Type:General  Level of Consciousness: awake, alert , oriented and responds to stimulation  Airway & Oxygen Therapy: Patient Spontanous Breathing and Patient connected to face mask oxygen  Post-op Assessment: Report given to RN, Post -op Vital signs reviewed and stable and Patient moving all extremities X 4  Post vital signs: Reviewed and stable  Last Vitals:  Vitals Value Taken Time  BP 116/64   Temp    Pulse 76 02/19/21 1158  Resp 14 02/19/21 1158  SpO2 96 % 02/19/21 1158  Vitals shown include unvalidated device data.  Last Pain:  Vitals:   02/19/21 0807  TempSrc: Temporal  PainSc: 0-No pain      Patients Stated Pain Goal: 0 (84/78/41 2820)  Complications: No notable events documented.

## 2021-02-19 NOTE — Anesthesia Preprocedure Evaluation (Addendum)
Anesthesia Evaluation  Patient identified by [name], date of birth, ID band Patient awake    Reviewed: Allergy & Precautions, H&P , NPO status , Patient's Chart, lab work & pertinent test results  Airway Mallampati: III  TM Distance: >3 FB Neck ROM: Full    Dental  (+) Caps   Pulmonary neg pulmonary ROS,    Pulmonary exam normal        Cardiovascular Exercise Tolerance: Good METS: 3 - Mets hypertension, Pt. on medications  Rhythm:Regular Rate:Normal - Peripheral Edema Bilateral artery stenosis -Carotid doppler 92/01/9416: 40-81% LICA; 4-48% RICA. b.) CTA neck 01/30/2021: critical stenosis LEFT carotid bulb.  DAPT therapy (ASA + clopidogrel  Myocardial perfusion Scan: 02/10/2021 .  The study is normal. The study is low risk. .  No ST deviation was noted. .  LV perfusion is normal. There is no evidence of ischemia. There is no evidence of infarction. .  Left ventricular function is normal. End diastolic cavity size is normal. End systolic cavity size is normal. .  CT attenuation images showed minimal aortic and coronary calcifications.  ECHO 10/22: 1. Left ventricular ejection fraction, by estimation, is 60 to 65%. The  left ventricle has normal function. The left ventricle has no regional  wall motion abnormalities. Left ventricular diastolic parameters were  normal.  2. Right ventricular systolic function is normal. The right ventricular  size is normal.  3. The mitral valve is normal in structure. No evidence of mitral valve  regurgitation.  4. The aortic valve is normal in structure. Aortic valve regurgitation is  not visualized. No aortic stenosis is present.  5. Possible left to right shunt seen by doppler.    Neuro/Psych PSYCHIATRIC DISORDERS Anxiety Gait inbalance CVA (01/03/2021 thrombosis of left middle cerebral ), No Residual Symptoms    GI/Hepatic negative GI ROS, Neg liver ROS,   Endo/Other  negative  endocrine ROS  Renal/GU CRFRenal disease (stage III)  negative genitourinary   Musculoskeletal  (+) Arthritis ,   Abdominal Normal abdominal exam  (+)   Peds negative pediatric ROS (+)  Hematology negative hematology ROS (+)   Anesthesia Other Findings   Reproductive/Obstetrics negative OB ROS                            Anesthesia Physical Anesthesia Plan  ASA: 3  Anesthesia Plan: General   Post-op Pain Management:    Induction: Intravenous  PONV Risk Score and Plan: 2 and Ondansetron and Dexamethasone  Airway Management Planned: Oral ETT  Additional Equipment: Arterial line  Intra-op Plan:   Post-operative Plan: Extubation in OR  Informed Consent: I have reviewed the patients History and Physical, chart, labs and discussed the procedure including the risks, benefits and alternatives for the proposed anesthesia with the patient or authorized representative who has indicated his/her understanding and acceptance.     Dental advisory given  Plan Discussed with: CRNA and Anesthesiologist  Anesthesia Plan Comments: (MAP Goal >75)       Anesthesia Quick Evaluation

## 2021-02-19 NOTE — Anesthesia Postprocedure Evaluation (Signed)
Anesthesia Post Note  Patient: Bobby Murillo  Procedure(s) Performed: ENDARTERECTOMY CAROTID (Left)  Patient location during evaluation: PACU Anesthesia Type: General Level of consciousness: awake and alert Pain management: pain level controlled Vital Signs Assessment: post-procedure vital signs reviewed and stable Respiratory status: spontaneous breathing, nonlabored ventilation, respiratory function stable and patient connected to nasal cannula oxygen Cardiovascular status: blood pressure returned to baseline and stable Postop Assessment: no apparent nausea or vomiting Anesthetic complications: no   No notable events documented.   Last Vitals:  Vitals:   02/19/21 1415 02/19/21 1442  BP: 117/72   Pulse: 71 64  Resp: 14 10  Temp:  36.7 C  SpO2: 94% 96%    Last Pain:  Vitals:   02/19/21 1442  TempSrc: Oral  PainSc: 0-No pain                 [place]

## 2021-02-19 NOTE — Interval H&P Note (Signed)
History and Physical Interval Note:  02/19/2021 7:57 AM  Bobby Murillo  has presented today for surgery, with the diagnosis of CAROTID ARTERY STENOSIS.  The various methods of treatment have been discussed with the patient and family. After consideration of risks, benefits and other options for treatment, the patient has consented to  Procedure(s): ENDARTERECTOMY CAROTID (Left) as a surgical intervention.  The patient's history has been reviewed, patient examined, no change in status, stable for surgery.  I have reviewed the patient's chart and labs.  Questions were answered to the patient's satisfaction.     Hortencia Pilar

## 2021-02-19 NOTE — Anesthesia Procedure Notes (Signed)
Arterial Line Insertion Start/End12/09/2020 8:35 AM, 02/19/2021 8:50 AM Performed by: Iran Ouch, MD, anesthesiologist  Patient location: OR. Preanesthetic checklist: patient identified, IV checked, site marked, risks and benefits discussed, surgical consent, monitors and equipment checked, pre-op evaluation, timeout performed and anesthesia consent Lidocaine 1% used for infiltration Left, radial was placed Catheter size: 20 G Hand hygiene performed  and maximum sterile barriers used   Attempts: 1 Procedure performed without using ultrasound guided technique. Following insertion, dressing applied. Post procedure assessment: normal and unchanged

## 2021-02-20 ENCOUNTER — Ambulatory Visit: Payer: Medicare Other | Admitting: Cardiology

## 2021-02-20 ENCOUNTER — Encounter: Payer: Self-pay | Admitting: Vascular Surgery

## 2021-02-20 DIAGNOSIS — I6522 Occlusion and stenosis of left carotid artery: Principal | ICD-10-CM

## 2021-02-20 LAB — CBC
HCT: 42.3 % (ref 39.0–52.0)
Hemoglobin: 14.4 g/dL (ref 13.0–17.0)
MCH: 31.1 pg (ref 26.0–34.0)
MCHC: 34 g/dL (ref 30.0–36.0)
MCV: 91.4 fL (ref 80.0–100.0)
Platelets: 224 10*3/uL (ref 150–400)
RBC: 4.63 MIL/uL (ref 4.22–5.81)
RDW: 12.5 % (ref 11.5–15.5)
WBC: 15 10*3/uL — ABNORMAL HIGH (ref 4.0–10.5)
nRBC: 0 % (ref 0.0–0.2)

## 2021-02-20 LAB — BASIC METABOLIC PANEL
Anion gap: 8 (ref 5–15)
BUN: 18 mg/dL (ref 8–23)
CO2: 22 mmol/L (ref 22–32)
Calcium: 9 mg/dL (ref 8.9–10.3)
Chloride: 106 mmol/L (ref 98–111)
Creatinine, Ser: 1.14 mg/dL (ref 0.61–1.24)
GFR, Estimated: >60 mL/min (ref >60)
Glucose, Bld: 135 mg/dL — ABNORMAL HIGH (ref 70–99)
Potassium: 4.3 mmol/L (ref 3.5–5.1)
Sodium: 136 mmol/L (ref 135–145)

## 2021-02-20 LAB — SURGICAL PATHOLOGY

## 2021-02-20 NOTE — Progress Notes (Addendum)
Pt discharged home with wife did not want to wait for medications that come from pharmacy stated that he has the medications at home and he will take them when he gets home

## 2021-02-20 NOTE — Plan of Care (Signed)
  Problem: Education: Goal: Knowledge of General Education information will improve Description: Including pain rating scale, medication(s)/side effects and non-pharmacologic comfort measures Outcome: Adequate for Discharge   Problem: Health Behavior/Discharge Planning: Goal: Ability to manage health-related needs will improve Outcome: Adequate for Discharge   Problem: Clinical Measurements: Goal: Ability to maintain clinical measurements within normal limits will improve Outcome: Adequate for Discharge Goal: Will remain free from infection Outcome: Adequate for Discharge Goal: Diagnostic test results will improve Outcome: Adequate for Discharge Goal: Respiratory complications will improve Outcome: Adequate for Discharge Goal: Cardiovascular complication will be avoided Outcome: Adequate for Discharge   Problem: Activity: Goal: Risk for activity intolerance will decrease Outcome: Adequate for Discharge   Problem: Nutrition: Goal: Adequate nutrition will be maintained Outcome: Adequate for Discharge   Problem: Coping: Goal: Level of anxiety will decrease Outcome: Adequate for Discharge   Problem: Elimination: Goal: Will not experience complications related to bowel motility Outcome: Adequate for Discharge Goal: Will not experience complications related to urinary retention Outcome: Adequate for Discharge   Problem: Pain Managment: Goal: General experience of comfort will improve Outcome: Adequate for Discharge   Problem: Safety: Goal: Ability to remain free from injury will improve Outcome: Adequate for Discharge   Problem: Skin Integrity: Goal: Risk for impaired skin integrity will decrease Outcome: Adequate for Discharge Patient went home

## 2021-02-20 NOTE — Discharge Instructions (Signed)
Vascular Surgery Discharge Instructions:  1) You may shower as of Friday.  Gently clean your incision with soap and water.  Gently pat dry. 2) The thin layer of glue on top of your incision will flake off on its own over the next week or 2.  Please do not try to pick it off. 3) Please do not engage in strenuous activity or lifting greater than 10 pounds until you are cleared at your first postoperative follow-up. 4) Please do not drive for 2 weeks.

## 2021-02-20 NOTE — Discharge Summary (Signed)
[facility]    Discharge Summary  Patient ID:  Bobby Murillo [name]: [id number] DOB/AGE: November 27, 1940 80 y.o.  Admit date: 02/19/2021 Discharge date: 02/20/2021 Date of Surgery: 02/19/2021 Surgeon: Surgeon(s): Schnier, Dolores Lory, MD  Admission Diagnosis: Carotid stenosis, symptomatic w/o infarct, left [I65.22]  Discharge Diagnoses:  Carotid stenosis, symptomatic w/o infarct, left [I65.22]  Secondary Diagnoses: Past Medical History:  Diagnosis Date   Abnormal LFTs    Anxiety    Aortic atherosclerosis ([place])    Arthritis    Basal cell carcinoma 09/14/2010   left calf    Bilateral carotid artery stenosis    a.) Carotid doppler 71/69/6789: 38-10% LICA; 1-75% RICA. b.) CTA neck 01/30/2021: critical stenosis LEFT carotid bulb.   CKD (chronic kidney disease), stage III ([place])    History of kidney stones    Hyperlipidemia    Hypertension    Long term current use of antithrombotics/antiplatelets    a.) DAPT therapy (ASA + clopidogrel)   Prostate cancer ([place]) 03/16/2008   Dr. Ronny Bacon   Sinus bradycardia by electrocardiogram    Stroke ([place]) 01/03/2021   a.) Acute infarction affecting the cortex. ?? incidental punctate acute white matter infarction just posterior to the atrium of the left lateral ventricle. Stenosis of the right M2 branch inferior division. Severe atherosclerotic disease of both PCAs with severe stenoses.   Procedure(s): 02/19/21: Left carotid endarterectomy with CorMatrix arterial patch reconstruction  Discharged Condition: Good  HPI / Hospital Course:  Bobby Murillo is a 80 y.o. y.o. male who presents with left carotid stenosis of 95%.  The risks, benefits, and alternatives to carotid endarterectomy were discussed with the patient. The differences between carotid stenting and carotid endarterectomy were reviewed.  The patient voiced understanding and appears to be aware that the risks of carotid endarterectomy include but are not limited to:  bleeding, infection, stroke, myocardial infarction, death, cranial nerve injuries both temporary and permanent, neck hematoma, possible airway compromise, labile blood pressure post-operatively, cerebral hyperperfusion syndrome, and possible need for additional interventions in the future. The patient is aware of the risks and agrees to proceed forward with the procedure.  On 02/19/21, the patient underwent: Left carotid endarterectomy with CorMatrix arterial patch reconstruction  The patient tolerated the procedure and was transferred from the recovery room to the ICU for observation overnight.  Patient's night of surgery was unremarkable.  During his brief stay, his diet was advanced his Foley was removed and he was urinating on his own, his pain was controlled to the use of p.o. pain medication and he was ambulating at baseline.  Day of discharge the patient was afebrile with stable vital signs.  Physical Exam:  Alert and oriented x3, no acute distress Face: Symmetrical, tongue midline Neck: Trachea midline, incision: clean, dry and intact - no swelling  Cardiovascular: Regular rate and rhythm Pulmonary: CTA bilaterally Abdomen: Soft, nontender, nondistended positive bowel sounds GU: foley removed - urinating Extremities: Warm distally to toes Neuro: Intact  Labs: As below  Complications: None  Consults: None  Significant Diagnostic Studies: CBC Lab Results  Component Value Date   WBC 15.0 (H) 02/20/2021   HGB 14.4 02/20/2021   HCT 42.3 02/20/2021   MCV 91.4 02/20/2021   PLT 224 02/20/2021   BMET    Component Value Date/Time   NA 136 02/20/2021 0558   NA 140 10/23/2011 1142   K 4.3 02/20/2021 0558   CL 106 02/20/2021 0558   CO2 22 02/20/2021 0558   GLUCOSE 135 (  H) 02/20/2021 0558   BUN 18 02/20/2021 0558   BUN 20 10/23/2011 1142   CREATININE 1.14 02/20/2021 0558   CREATININE 1.26 (H) 12/22/2019 1508   CALCIUM 9.0 02/20/2021 0558   GFRNONAA >60 02/20/2021 0558    GFRAA 69 10/23/2011 1142   COAG Lab Results  Component Value Date   INR 1.0 01/03/2021   Disposition:  Discharge to :Home Discharge Instructions     Call MD for:  redness, tenderness, or signs of infection (pain, swelling, bleeding, redness, odor or green/yellow discharge around incision site)   Complete by: As directed    Call MD for:  severe or increased pain, loss or decreased feeling  in affected limb(s)   Complete by: As directed    Call MD for:  temperature >100.5   Complete by: As directed    Discharge instructions   Complete by: As directed    Okay to shower   Driving Restrictions   Complete by: As directed    No driving for 1 week   Lifting restrictions   Complete by: As directed    No lifting for 1 week   No dressing needed   Complete by: As directed    Replace only if drainage present   Resume previous diet   Complete by: As directed       Allergies as of 02/20/2021       Reactions   Alfuzosin Other (See Comments)   Other Reaction: fainting spell   Pravastatin Sodium    Other reaction(s): Muscle Pain        Medication List     STOP taking these medications    clopidogrel 75 MG tablet Commonly known as: PLAVIX       TAKE these medications    acetaminophen 325 MG tablet Commonly known as: TYLENOL Take 650 mg by mouth every 6 (six) hours as needed for moderate pain.   ALPRAZolam 0.5 MG tablet Commonly known as: XANAX TAKE 1 TABLET BY MOUTH AT BEDTIME AS NEEDED FOR SLEEP What changed:  how much to take when to take this additional instructions   aspirin 81 MG EC tablet Take 1 tablet (81 mg total) by mouth daily. Swallow whole.   atorvastatin 20 MG tablet Commonly known as: LIPITOR Take 1 tablet (20 mg total) by mouth 2 (two) times a week.   ezetimibe 10 MG tablet Commonly known as: ZETIA Take 1 tablet by mouth once daily   fluticasone 50 MCG/ACT nasal spray Commonly known as: FLONASE Place 2 sprays into both nostrils daily.    GLUCOSAMINE CHONDR COMPLEX PO Take 1 tablet by mouth daily. tab by mouth daily   losartan 50 MG tablet Commonly known as: COZAAR Take 1 tablet by mouth once daily   polycarbophil 625 MG tablet Commonly known as: FIBERCON Take 625 mg by mouth daily.       Verbal and written Discharge instructions given to the patient. Wound care per Discharge AVS  Follow-up Information     Schnier, Dolores Lory, MD Follow up in 3 week(s).   Specialties: Vascular Surgery, Cardiology, Radiology, Vascular Surgery Why: with a carotid duplex Contact information: [place] [address] [phone]                Signed: Sela Hua, PA-C 02/20/2021, 1:15 PM

## 2021-02-22 ENCOUNTER — Emergency Department
Admission: EM | Admit: 2021-02-22 | Discharge: 2021-02-23 | Disposition: A | Discharge status: Home / Self Care | Payer: Medicare Other | Attending: Emergency Medicine | Admitting: Emergency Medicine

## 2021-02-22 ENCOUNTER — Emergency Department: Payer: Medicare Other

## 2021-02-22 DIAGNOSIS — I499 Cardiac arrhythmia, unspecified: Secondary | ICD-10-CM | POA: Diagnosis not present

## 2021-02-22 DIAGNOSIS — R001 Bradycardia, unspecified: Secondary | ICD-10-CM | POA: Insufficient documentation

## 2021-02-22 DIAGNOSIS — I6523 Occlusion and stenosis of bilateral carotid arteries: Secondary | ICD-10-CM | POA: Diagnosis not present

## 2021-02-22 DIAGNOSIS — Z8546 Personal history of malignant neoplasm of prostate: Secondary | ICD-10-CM | POA: Insufficient documentation

## 2021-02-22 DIAGNOSIS — Z85828 Personal history of other malignant neoplasm of skin: Secondary | ICD-10-CM | POA: Insufficient documentation

## 2021-02-22 DIAGNOSIS — Z7902 Long term (current) use of antithrombotics/antiplatelets: Secondary | ICD-10-CM | POA: Diagnosis not present

## 2021-02-22 DIAGNOSIS — R55 Syncope and collapse: Secondary | ICD-10-CM | POA: Insufficient documentation

## 2021-02-22 DIAGNOSIS — R221 Localized swelling, mass and lump, neck: Secondary | ICD-10-CM | POA: Diagnosis not present

## 2021-02-22 DIAGNOSIS — N1831 Chronic kidney disease, stage 3a: Secondary | ICD-10-CM | POA: Insufficient documentation

## 2021-02-22 DIAGNOSIS — R6889 Other general symptoms and signs: Secondary | ICD-10-CM | POA: Diagnosis not present

## 2021-02-22 DIAGNOSIS — Z7982 Long term (current) use of aspirin: Secondary | ICD-10-CM | POA: Insufficient documentation

## 2021-02-22 DIAGNOSIS — R410 Disorientation, unspecified: Secondary | ICD-10-CM | POA: Diagnosis not present

## 2021-02-22 DIAGNOSIS — R404 Transient alteration of awareness: Secondary | ICD-10-CM | POA: Diagnosis not present

## 2021-02-22 DIAGNOSIS — Z743 Need for continuous supervision: Secondary | ICD-10-CM | POA: Diagnosis not present

## 2021-02-22 DIAGNOSIS — T8182XA Emphysema (subcutaneous) resulting from a procedure, initial encounter: Secondary | ICD-10-CM | POA: Diagnosis not present

## 2021-02-22 DIAGNOSIS — I129 Hypertensive chronic kidney disease with stage 1 through stage 4 chronic kidney disease, or unspecified chronic kidney disease: Secondary | ICD-10-CM | POA: Insufficient documentation

## 2021-02-22 DIAGNOSIS — M47812 Spondylosis without myelopathy or radiculopathy, cervical region: Secondary | ICD-10-CM | POA: Diagnosis not present

## 2021-02-22 LAB — CBC WITH DIFFERENTIAL/PLATELET
Abs Immature Granulocytes: 0.11 10*3/uL — ABNORMAL HIGH (ref 0.00–0.07)
Basophils Absolute: 0.1 10*3/uL (ref 0.0–0.1)
Basophils Relative: 1 %
Eosinophils Absolute: 0.1 10*3/uL (ref 0.0–0.5)
Eosinophils Relative: 2 %
HCT: 42.7 % (ref 39.0–52.0)
Hemoglobin: 14.4 g/dL (ref 13.0–17.0)
Immature Granulocytes: 1 %
Lymphocytes Relative: 15 %
Lymphs Abs: 1.3 10*3/uL (ref 0.7–4.0)
MCH: 31.2 pg (ref 26.0–34.0)
MCHC: 33.7 g/dL (ref 30.0–36.0)
MCV: 92.6 fL (ref 80.0–100.0)
Monocytes Absolute: 1.2 10*3/uL — ABNORMAL HIGH (ref 0.1–1.0)
Monocytes Relative: 14 %
Neutro Abs: 5.6 10*3/uL (ref 1.7–7.7)
Neutrophils Relative %: 67 %
Platelets: 232 10*3/uL (ref 150–400)
RBC: 4.61 MIL/uL (ref 4.22–5.81)
RDW: 12.4 % (ref 11.5–15.5)
WBC: 8.3 10*3/uL (ref 4.0–10.5)
nRBC: 0 % (ref 0.0–0.2)

## 2021-02-22 LAB — TROPONIN I (HIGH SENSITIVITY): Troponin I (High Sensitivity): 8 ng/L (ref <18)

## 2021-02-22 LAB — BASIC METABOLIC PANEL
Anion gap: 7 (ref 5–15)
BUN: 19 mg/dL (ref 8–23)
CO2: 22 mmol/L (ref 22–32)
Calcium: 8.4 mg/dL — ABNORMAL LOW (ref 8.9–10.3)
Chloride: 108 mmol/L (ref 98–111)
Creatinine, Ser: 1.38 mg/dL — ABNORMAL HIGH (ref 0.61–1.24)
GFR, Estimated: 52 mL/min — ABNORMAL LOW (ref >60)
Glucose, Bld: 118 mg/dL — ABNORMAL HIGH (ref 70–99)
Potassium: 3.9 mmol/L (ref 3.5–5.1)
Sodium: 137 mmol/L (ref 135–145)

## 2021-02-22 LAB — PROTIME-INR
INR: 1 (ref 0.8–1.2)
Prothrombin Time: 13.6 seconds (ref 11.4–15.2)

## 2021-02-22 MED ORDER — SODIUM CHLORIDE 0.9 % IV BOLUS
500.0000 mL | Freq: Once | INTRAVENOUS | Status: AC
  Administered 2021-02-22: 500 mL via INTRAVENOUS

## 2021-02-22 MED ORDER — IOHEXOL 350 MG/ML SOLN
75.0000 mL | Freq: Once | INTRAVENOUS | Status: AC | PRN
  Administered 2021-02-22: 75 mL via INTRAVENOUS

## 2021-02-22 NOTE — ED Triage Notes (Signed)
Pt who is 3 fays post carotid "clean out" post cva one month ago arrives today with AMS, syncopal episode tonight. Pt arrives alert and oriented at this time

## 2021-02-22 NOTE — ED Provider Notes (Signed)
[facility] Emergency Department Provider Note  ____________________________________________  Time seen: Approximately 11:33 PM  I have reviewed the triage vital signs and the nursing notes.   HISTORY  Chief Complaint Altered Mental Status and Near Syncope    HPI Bobby Murillo is a 80 y.o. male with a history of CKD hypertension, prior stroke, recent carotid endarterectomy 3 days ago who comes the ED due to syncope.  Patient has been feeling well, went to a house party for a friend's 80th birthday, and as they were getting ready to sit down to eat, he reports blurry vision, feeling clammy and sweaty and passing out.  No significant fall or trauma.  Denies neck pain, no preceding pain syndrome such as chest pain headache shortness of breath or abdominal pain.  Currently he states he feels fine, denies any acute symptoms currently.  Patient does report having some whiskey to drink at the party.  EMS report initially blood pressure was low, and they gave 1 L fluid bolus.  Patient denies any previous episodes of fast or slow heart rate or dizziness.  Past Medical History:  Diagnosis Date   Abnormal LFTs    Anxiety    Aortic atherosclerosis (HCC)    Arthritis    Basal cell carcinoma 09/14/2010   left calf    Bilateral carotid artery stenosis    a.) Carotid doppler 87/86/7672: 09-47% LICA; 0-96% RICA. b.) CTA neck 01/30/2021: critical stenosis LEFT carotid bulb.   CKD (chronic kidney disease), stage III ([place])    History of kidney stones    Hyperlipidemia    Hypertension    Long term current use of antithrombotics/antiplatelets    a.) DAPT therapy (ASA + clopidogrel)   Prostate cancer ([place]) 03/16/2008   Dr. Ronny Bacon   Sinus bradycardia by electrocardiogram    Stroke ([place]) 01/03/2021   a.) Acute infarction affecting the cortex. ?? incidental punctate acute white matter infarction just posterior to the atrium of the left lateral ventricle. Stenosis of the right  M2 branch inferior division. Severe atherosclerotic disease of both PCAs with severe stenoses.     Patient Active Problem List   Diagnosis Date Noted   Carotid stenosis, symptomatic w/o infarct, left 02/19/2021   Carotid stenosis, symptomatic, with infarction ([place]) 01/20/2021   Hospital discharge follow-up 01/11/2021   History of CVA (cerebrovascular accident) without residual deficits 01/11/2021   Acute cerebrovascular accident (CVA) due to thrombosis of left middle cerebral artery ([place]) 01/05/2021   Internal carotid artery stenosis 01/05/2021   Atrial septal defect 01/05/2021   Hyperlipidemia    Anxiety    Leukocytosis    Balance problem 12/23/2020   Irregular heart rhythm 05/27/2020   Abdominal aortic atherosclerosis ([place]) 05/06/2020   Myalgia due to statin 05/06/2020   Vertigo 04/24/2020   Allergic rhinitis 03/06/2020   Ankle edema, bilateral 12/17/2019   Rigidity of 1st MTP joint, left 08/07/2019   Stage 3a chronic kidney disease ([place]) 08/07/2019   Cognitive complaints with normal neuropsychological exam 02/21/2019   Right hip pain 02/09/2018   Polyarthralgia 02/08/2018   Erythrocytosis 02/08/2018   Insomnia due to psychological stress 02/02/2017   Encounter for preventive health examination 02/02/2017   Essential hypertension 06/01/2015   Sinus bradycardia 06/01/2015   Encounter for Medicare annual wellness exam 12/02/2014   Statin intolerance 11/03/2012   Degenerative joint disease of knee 02/15/2011   Hyperlipidemia LDL goal <100 02/15/2011   History of prostate cancer    Basal cell carcinoma  Past Surgical History:  Procedure Laterality Date   COLONOSCOPY     ENDARTERECTOMY Left 02/19/2021   Procedure: ENDARTERECTOMY CAROTID;  Surgeon: Katha Cabal, MD;  Location: [facility] ORS;  Service: Vascular;  Laterality: Left;   PROSTATECTOMY  11/14/2008   transurethral, radical, Dr. Darcus Austin     Prior to Admission medications   Medication Sig Start Date End Date  Taking? Authorizing Provider  acetaminophen (TYLENOL) 325 MG tablet Take 650 mg by mouth every 6 (six) hours as needed for moderate pain.    [provider]  ALPRAZolam (XANAX) 0.5 MG tablet TAKE 1 TABLET BY MOUTH AT BEDTIME AS NEEDED FOR SLEEP Patient taking differently: Take 0.25 mg by mouth at bedtime. 02/10/21   Crecencio Mc, MD  aspirin EC 81 MG EC tablet Take 1 tablet (81 mg total) by mouth daily. Swallow whole. 01/05/21   Mercy Riding, MD  atorvastatin (LIPITOR) 20 MG tablet Take 1 tablet (20 mg total) by mouth 2 (two) times a week. 05/09/20   Crecencio Mc, MD  ezetimibe (ZETIA) 10 MG tablet Take 1 tablet by mouth once daily 01/03/21   Crecencio Mc, MD  fluticasone [facility]) 50 MCG/ACT nasal spray Place 2 sprays into both nostrils daily. Patient not taking: Reported on 02/13/2021 11/08/20   Crecencio Mc, MD  Glucosamine-Chondroitin (GLUCOSAMINE CHONDR COMPLEX PO) Take 1 tablet by mouth daily. tab by mouth daily 11/20/08   [provider]  losartan (COZAAR) 50 MG tablet Take 1 tablet by mouth once daily 09/09/20   Crecencio Mc, MD  polycarbophil (FIBERCON) 625 MG tablet Take 625 mg by mouth daily.    [provider]     Allergies Alfuzosin and Pravastatin sodium   Family History  Problem Relation Age of Onset   Alzheimer's disease Mother    Stroke Father    Prostate cancer Father    Alzheimer's disease Sister    Stroke Paternal Aunt    Heart disease Paternal Uncle 40    Social History Social History   Tobacco Use   Smoking status: Never   Smokeless tobacco: Never  Vaping Use   Vaping Use: Never used  Substance Use Topics   Alcohol use: Yes    Alcohol/week: 8.0 standard drinks    Types: 4 Glasses of wine, 4 Shots of liquor per week    Comment: occasional   Drug use: No    Review of Systems  Constitutional:   No fever or chills.  ENT:   No sore throat. No rhinorrhea. Cardiovascular:   No chest pain positive  syncope. Respiratory:   No dyspnea or cough. Gastrointestinal:   Negative for abdominal pain, vomiting and diarrhea.  Musculoskeletal:   Negative for focal pain or swelling All other systems reviewed and are negative except as documented above in ROS and HPI.  ____________________________________________   PHYSICAL EXAM:  VITAL SIGNS: ED Triage Vitals  Enc Vitals Group     BP 02/22/21 2035 (!) 139/104     Pulse Rate 02/22/21 2035 (!) 54     Resp 02/22/21 2035 (!) 21     Temp 02/22/21 2035 98.7 F (37.1 C)     Temp Source 02/22/21 2035 Oral     SpO2 02/22/21 2025 95 %     Weight 02/22/21 2027 203 lb (92.1 kg)     Height --      Head Circumference --      Peak Flow --      Pain Score 02/22/21  2035 0     Pain Loc --      Pain Edu? --      Excl. in [place]? --     Vital signs reviewed, nursing assessments reviewed.   Constitutional:   Alert and oriented. Non-toxic appearance. Eyes:   Conjunctivae are normal. EOMI. PERRL. ENT      Head:   Normocephalic and atraumatic.      Nose:   Normal      Mouth/Throat:   Normal      Neck:   No meningismus. Full ROM.  No midline tenderness.  Recent surgical incision over the left neck related to CEA procedure appears noninflamed, not tender.  No drainage.  There is a pulsatile mass in the left neck. Hematological/Lymphatic/Immunilogical:   No cervical lymphadenopathy. Cardiovascular:   RRR. Symmetric bilateral radial and DP pulses.  No murmurs. Cap refill less than 2 seconds. Respiratory:   Normal respiratory effort without tachypnea/retractions. Breath sounds are clear and equal bilaterally. No wheezes/rales/rhonchi. Gastrointestinal:   Soft and nontender. Non distended. There is no CVA tenderness.  No rebound, rigidity, or guarding. Genitourinary:   deferred Musculoskeletal:   Normal range of motion in all extremities. No joint effusions.  No lower extremity tenderness.  No edema. Neurologic:   Normal speech and language.  Motor grossly  intact. No acute focal neurologic deficits are appreciated.  Skin:    Skin is warm, dry and intact. No rash noted.  No petechiae, purpura, or bullae.  ____________________________________________    LABS (pertinent positives/negatives) (all labs ordered are listed, but only abnormal results are displayed) Labs Reviewed  BASIC METABOLIC PANEL - Abnormal; Notable for the following components:      Result Value   Glucose, Bld 118 (*)    Creatinine, Ser 1.38 (*)    Calcium 8.4 (*)    GFR, Estimated 52 (*)    All other components within normal limits  CBC WITH DIFFERENTIAL/PLATELET - Abnormal; Notable for the following components:   Monocytes Absolute 1.2 (*)    Abs Immature Granulocytes 0.11 (*)    All other components within normal limits  PROTIME-INR  TROPONIN I (HIGH SENSITIVITY)  TROPONIN I (HIGH SENSITIVITY)   ____________________________________________   EKG  Interpreted by me Sinus bradycardia, rate of 57, normal axis and intervals.  Normal QRS ST segments and T waves.  ____________________________________________    RADIOLOGY  CT Angio Neck W and/or Wo Contrast  Result Date: 02/22/2021 CLINICAL DATA:  Initial evaluation for pulsatile neck mass, recent CEA. EXAM: CT ANGIOGRAPHY NECK TECHNIQUE: Multidetector CT imaging of the neck was performed using the standard protocol during bolus administration of intravenous contrast. Multiplanar CT image reconstructions and MIPs were obtained to evaluate the vascular anatomy. Carotid stenosis measurements (when applicable) are obtained utilizing NASCET criteria, using the distal internal carotid diameter as the denominator. CONTRAST:  104mL OMNIPAQUE IOHEXOL 350 MG/ML SOLN COMPARISON:  Prior study from 01/31/2011. FINDINGS: Aortic arch: Visualized aortic arch normal in caliber with normal 3 vessel morphology. No stenosis or other abnormality about the origin of the great vessels. Right carotid system: Right common carotid artery  patent from its origin to the bifurcation without stenosis. Minimal plaque about the right carotid bulb without stenosis. Right ICA patent distally without stenosis or dissection. Left carotid system: Left CCA widely patent proximally. Postoperative changes from recent carotid endarterectomy seen about the left carotid bifurcation. Diffuse soft tissue density seen within the left carotid sheath, surrounding the distal left common and proximal left internal carotid arteries,  likely postoperative. Few scattered foci of postoperative emphysema noted as well. No active contrast extravasation or pseudoaneurysm formation. No raised dissection flap or other complication. Residual stenosis of up to approximately 45% seen at the origin of the left ICA (series 8, image 120). Left ICA otherwise patent distally to the skull base without stenosis or dissection. Left external carotid artery and its branches remain patent and well perfused. Vertebral arteries: Both vertebral arteries arise from the subclavian arteries. Vertebral arteries widely patent without stenosis or dissection. Skeleton: No visible acute osseous finding. No discrete or worrisome osseous lesions. Multilevel cervical spondylosis without high-grade spinal stenosis. Other neck: Diffuse soft tissue density and stranding seen surrounding the left carotid artery and left carotid sheath, most pronounced at the level of the bifurcation, likely reflecting postoperative changes. Suspected small amount of blood products within this region. Few scattered foci of soft tissue emphysema. No active contrast extravasation. Postoperative thickening and swelling seen within the overlying left sternocleidomastoid muscle and adjacent soft tissues. No frank hematoma formation. No other acute soft tissue abnormality within the neck. Upper chest: Visualized upper chest demonstrates no other acute finding. IMPRESSION: 1. Postoperative changes from recent left carotid endarterectomy.  Diffuse soft tissue density and stranding surrounding the left carotid artery and left carotid sheath likely reflects postoperative changes. No active contrast extravasation, pseudoaneurysm formation, or frank hematoma formation. Previously seen left ICA stenosis is markedly improved, with no hemodynamically significant greater than 50% stenosis now seen. 2. Otherwise negative CTA of the neck. Electronically Signed   By: Jeannine Boga M.D.   On: 02/22/2021 22:57    ____________________________________________   PROCEDURES Procedures  ____________________________________________  DIFFERENTIAL DIAGNOSIS   Electrolyte abnormality, dehydration, non-STEMI, vasovagal syncope, carotid sinus into syncope related to recent surgery  CLINICAL IMPRESSION / ASSESSMENT AND PLAN / ED COURSE  Medications ordered in the ED: Medications  sodium chloride 0.9 % bolus 500 mL (500 mLs Intravenous New Bag/Given 02/22/21 2256)  iohexol (OMNIPAQUE) 350 MG/ML injection 75 mL (75 mLs Intravenous Contrast Given 02/22/21 2201)    Pertinent labs & imaging results that were available during my care of the patient were reviewed by me and considered in my medical decision making (see chart for details).  Bobby Murillo was evaluated in Emergency Department on 02/22/2021 for the symptoms described in the history of present illness. He was evaluated in the context of the global COVID-19 pandemic, which necessitated consideration that the patient might be at risk for infection with the SARS-CoV-2 virus that causes COVID-19. Institutional protocols and algorithms that pertain to the evaluation of patients at risk for COVID-19 are in a state of rapid change based on information released by regulatory bodies including the CDC and federal and state organizations. These policies and algorithms were followed during the patient's care in the ED.   Patient presents with syncope, no worrisome prodromal symptoms.  Currently  asymptomatic.  EKG unremarkable.  Due to the pulsatile mass in the left neck with a recent surgery, CT scan obtained which is reassuring without acute worrisome findings.  With his age and recent surgery, will obtain serial troponins.  Initial lab panels unremarkable, troponin is normal.  If repeat troponin is stable, patient is suitable for discharge and outpatient follow-up.      ____________________________________________   FINAL CLINICAL IMPRESSION(S) / ED DIAGNOSES    Final diagnoses:  Syncope, unspecified syncope type     ED Discharge Orders     None       Portions of this  note were generated with dragon dictation software. Dictation errors may occur despite best attempts at proofreading.    Carrie Mew, MD 02/22/21 [phone]

## 2021-02-22 NOTE — Discharge Instructions (Addendum)
Continue daily medications as directed by your doctor.  Return to the ER for recurrent or worsening symptoms, persistent vomiting, difficulty breathing, lethargy or other concerns.

## 2021-02-23 LAB — TROPONIN I (HIGH SENSITIVITY): Troponin I (High Sensitivity): 7 ng/L (ref <18)

## 2021-02-23 NOTE — ED Provider Notes (Signed)
-----------------------------------------   1:33 AM on 02/23/2021 -----------------------------------------   Delay secondary to lab.  Repeat troponin unremarkable.  Patient feeling fine; voices no complaints of chest pain, shortness of breath, nausea/vomiting or dizziness.  He and his wife are very eager for discharge home.  Strict return precautions given.  Both verbalized understanding and agree with plan of care.   Paulette Blanch, MD 02/23/21 [phone]

## 2021-02-27 ENCOUNTER — Other Ambulatory Visit: Payer: Self-pay | Admitting: Internal Medicine

## 2021-03-04 ENCOUNTER — Other Ambulatory Visit: Payer: Self-pay

## 2021-03-04 ENCOUNTER — Ambulatory Visit (INDEPENDENT_AMBULATORY_CARE_PROVIDER_SITE_OTHER): Payer: Medicare Other | Admitting: Internal Medicine

## 2021-03-04 ENCOUNTER — Encounter: Payer: Self-pay | Admitting: Internal Medicine

## 2021-03-04 VITALS — BP 124/78 | HR 56 | Temp 97.6°F | Ht 72.0 in | Wt 202.6 lb

## 2021-03-04 DIAGNOSIS — I499 Cardiac arrhythmia, unspecified: Secondary | ICD-10-CM

## 2021-03-04 DIAGNOSIS — R001 Bradycardia, unspecified: Secondary | ICD-10-CM

## 2021-03-04 DIAGNOSIS — I63239 Cerebral infarction due to unspecified occlusion or stenosis of unspecified carotid arteries: Secondary | ICD-10-CM

## 2021-03-04 DIAGNOSIS — Z789 Other specified health status: Secondary | ICD-10-CM

## 2021-03-04 DIAGNOSIS — I4940 Unspecified premature depolarization: Secondary | ICD-10-CM

## 2021-03-04 DIAGNOSIS — R55 Syncope and collapse: Secondary | ICD-10-CM | POA: Diagnosis not present

## 2021-03-04 DIAGNOSIS — E785 Hyperlipidemia, unspecified: Secondary | ICD-10-CM | POA: Diagnosis not present

## 2021-03-04 LAB — COMPREHENSIVE METABOLIC PANEL
ALT: 23 U/L (ref 0–53)
AST: 22 U/L (ref 0–37)
Albumin: 3.9 g/dL (ref 3.5–5.2)
Alkaline Phosphatase: 88 U/L (ref 39–117)
BUN: 17 mg/dL (ref 6–23)
CO2: 25 mEq/L (ref 19–32)
Calcium: 9.8 mg/dL (ref 8.4–10.5)
Chloride: 106 mEq/L (ref 96–112)
Creatinine, Ser: 1.28 mg/dL (ref 0.40–1.50)
GFR: 53.53 mL/min — ABNORMAL LOW (ref >60.00)
Glucose, Bld: 78 mg/dL (ref 70–99)
Potassium: 4.3 mEq/L (ref 3.5–5.1)
Sodium: 139 mEq/L (ref 135–145)
Total Bilirubin: 1.3 mg/dL — ABNORMAL HIGH (ref 0.2–1.2)
Total Protein: 6.4 g/dL (ref 6.0–8.3)

## 2021-03-04 LAB — MAGNESIUM: Magnesium: 2.3 mg/dL (ref 1.5–2.5)

## 2021-03-04 LAB — TSH: TSH: 3.27 u[IU]/mL (ref 0.35–5.50)

## 2021-03-04 NOTE — Assessment & Plan Note (Signed)
Chronic and longstanding,  . Unclear if recent syncopal episode occurred after a sinus pause .  Referring for Holter monitor

## 2021-03-04 NOTE — Assessment & Plan Note (Signed)
Patient continues to have  PACs  In the setting of a recent syncopal episodes .  Lytes and thyroid function are normal.  Referral for holter monitor recommended and in progress

## 2021-03-04 NOTE — Assessment & Plan Note (Signed)
S/p left CEA on Dec 8.  Recent CT Angiogram done during ER evaluation for syncope was reviewed by Dr Delana Meyer and notes expected postoperative changes

## 2021-03-04 NOTE — Progress Notes (Signed)
Subjective:  Patient ID: Bobby Murillo, male    DOB: March 15, 1941  Age: 80 y.o. [name]: [id number]  CC: The primary encounter diagnosis was Irregular heart rate. Diagnoses of Cardiac arrhythmia due to premature depolarization, unspecified type, Statin intolerance, Sinus bradycardia, Irregular heart rhythm, Syncope, unspecified syncope type, Hyperlipidemia LDL goal <100, and Carotid stenosis, symptomatic, with infarction [facility]) were also pertinent to this visit.  HPI Bobby Murillo presents for  ER follow up  Chief Complaint  Patient presents with   Follow-up    ED follow up from a syncopal episode that occurred on 02/22/2021   80 yr old male with  history of sinus bradycardia, recent CVA with critical carotid stenosis s/p left CEA on Dec 8 presented to ED fter a syncopal event that occurred while attending a friend's birthday party.  Patient does not remember the event details today ,  but per ER physician report he states that he was in a fasting state and developed blurred vision and  became diaphoretic prior to losing consciousness. He had drink an alcoholic cocktail, and had tolerated 2 beers the night before without symptoms.  It is unclear how long he was unconscious but he was taken to the ER.  Troponins were negative , he was given one liter of  IV fluids one liter,  and a CT angiogram was done to evaluate a left sided pulsatile mass in the area of his recent CEA  . results edited and below:  " Left CCA widely patent proximally. Postoperative changes from recent carotid endarterectomy seen about the left carotid bifurcation. Diffuse soft tissue density seen within the left carotid sheath, surrounding the distal left common and proximal left internal carotid arteries, likely postoperative. Few scattered foci of postoperative emphysema noted as well. No active contrast extravasation or pseudoaneurysm formation. No raised dissection flap or other complication. Residual stenosis of up to approximately  45% seen at the origin of the left ICA (series 8, image 120). Left ICA otherwise patent distally to the skull base without stenosis or dissection. Left external carotid artery and its branches remain patent and well perfused. "   Today's heart rate at home was reportedly 100 bpm which is unusual for him and BP was lower than usual at  110/75 .  Taking losartan 50 mg and  no longer taking amlodipine .  Fluid intake is chronically low. 2 cups of coffee every morning.   Outpatient Medications Prior to Visit  Medication Sig Dispense Refill   acetaminophen (TYLENOL) 325 MG tablet Take 650 mg by mouth every 6 (six) hours as needed for moderate pain.     ALPRAZolam (XANAX) 0.5 MG tablet TAKE 1 TABLET BY MOUTH AT BEDTIME AS NEEDED FOR SLEEP (Patient taking differently: Take 0.25 mg by mouth at bedtime.) 30 tablet 5   aspirin EC 81 MG EC tablet Take 1 tablet (81 mg total) by mouth daily. Swallow whole. 30 tablet 11   atorvastatin (LIPITOR) 20 MG tablet Take 1 tablet (20 mg total) by mouth 2 (two) times a week. 26 tablet 3   ezetimibe (ZETIA) 10 MG tablet Take 1 tablet by mouth once daily 90 tablet 0   fluticasone (FLONASE) 50 MCG/ACT nasal spray Place 2 sprays into both nostrils daily. 16 g 6   Glucosamine-Chondroitin (GLUCOSAMINE CHONDR COMPLEX PO) Take 1 tablet by mouth daily. tab by mouth daily     losartan (COZAAR) 50 MG tablet Take 1 tablet by mouth once daily 90 tablet 0   polycarbophil (  FIBERCON) 625 MG tablet Take 625 mg by mouth daily.     No facility-administered medications prior to visit.    Review of Systems;  Patient denies headache, fevers, malaise, unintentional weight loss, skin rash, eye pain, sinus congestion and sinus pain, sore throat, dysphagia,  hemoptysis , cough, dyspnea, wheezing, chest pain, palpitations, orthopnea, edema, abdominal pain, nausea, melena, diarrhea, constipation, flank pain, dysuria, hematuria, urinary  Frequency, nocturia, numbness, tingling, seizures,  Focal  weakness, Loss of consciousness,  Tremor, insomnia, depression, anxiety, and suicidal ideation.      Objective:  BP 124/78 (BP Location: Left Arm, Patient Position: Sitting, Cuff Size: Normal)    Pulse (!) 56    Temp 97.6 F (36.4 C) (Temporal)    Ht 6' (1.829 m)    Wt 202 lb 9.6 oz (91.9 kg)    SpO2 97%    BMI 27.48 kg/m   BP Readings from Last 3 Encounters:  03/04/21 124/78  02/23/21 (!) 152/83  02/20/21 118/88    Wt Readings from Last 3 Encounters:  03/04/21 202 lb 9.6 oz (91.9 kg)  02/22/21 203 lb (92.1 kg)  02/19/21 214 lb 15.2 oz (97.5 kg)    General appearance: alert, cooperative and appears stated age Ears: normal TM's and external ear canals both ears Throat: lips, mucosa, and tongue normal; teeth and gums normal Neck: no adenopathy, no carotid bruit, supple, symmetrical, trachea midline and thyroid not enlarged, symmetric, no tenderness/mass/nodules Back: symmetric, no curvature. ROM normal. No CVA tenderness. Lungs: clear to auscultation bilaterally Heart: regular rate and rhythm, S1, S2 normal, no murmur, click, rub or gallop Abdomen: soft, non-tender; bowel sounds normal; no masses,  no organomegaly Pulses: 2+ and symmetric Skin: Skin color, texture, turgor normal. No rashes or lesions Lymph nodes: Cervical, supraclavicular, and axillary nodes normal.  Lab Results  Component Value Date   HGBA1C 6.0 (H) 01/03/2021   HGBA1C 5.8 06/09/2019   HGBA1C 5.9 05/08/2019    Lab Results  Component Value Date   CREATININE 1.28 03/04/2021   CREATININE 1.38 (H) 02/22/2021   CREATININE 1.14 02/20/2021    Lab Results  Component Value Date   WBC 8.3 02/22/2021   HGB 14.4 02/22/2021   HCT 42.7 02/22/2021   PLT 232 02/22/2021   GLUCOSE 78 03/04/2021   CHOL 186 01/03/2021   TRIG 93 01/03/2021   HDL 64 01/03/2021   LDLDIRECT 184.0 12/30/2015   LDLCALC 103 (H) 01/03/2021   ALT 23 03/04/2021   AST 22 03/04/2021   NA 139 03/04/2021   K 4.3 03/04/2021   CL 106  03/04/2021   CREATININE 1.28 03/04/2021   BUN 17 03/04/2021   CO2 25 03/04/2021   TSH 3.27 03/04/2021   PSA 0.00 (L) 05/06/2020   INR 1.0 02/22/2021   HGBA1C 6.0 (H) 01/03/2021   MICROALBUR <0.7 12/15/2019    CT Angio Neck W and/or Wo Contrast  Result Date: 02/22/2021 CLINICAL DATA:  Initial evaluation for pulsatile neck mass, recent CEA. EXAM: CT ANGIOGRAPHY NECK TECHNIQUE: Multidetector CT imaging of the neck was performed using the standard protocol during bolus administration of intravenous contrast. Multiplanar CT image reconstructions and MIPs were obtained to evaluate the vascular anatomy. Carotid stenosis measurements (when applicable) are obtained utilizing NASCET criteria, using the distal internal carotid diameter as the denominator. CONTRAST:  57mL OMNIPAQUE IOHEXOL 350 MG/ML SOLN COMPARISON:  Prior study from 01/31/2011. FINDINGS: Aortic arch: Visualized aortic arch normal in caliber with normal 3 vessel morphology. No stenosis or other abnormality about the  origin of the great vessels. Right carotid system: Right common carotid artery patent from its origin to the bifurcation without stenosis. Minimal plaque about the right carotid bulb without stenosis. Right ICA patent distally without stenosis or dissection. Left carotid system: Left CCA widely patent proximally. Postoperative changes from recent carotid endarterectomy seen about the left carotid bifurcation. Diffuse soft tissue density seen within the left carotid sheath, surrounding the distal left common and proximal left internal carotid arteries, likely postoperative. Few scattered foci of postoperative emphysema noted as well. No active contrast extravasation or pseudoaneurysm formation. No raised dissection flap or other complication. Residual stenosis of up to approximately 45% seen at the origin of the left ICA (series 8, image 120). Left ICA otherwise patent distally to the skull base without stenosis or dissection. Left  external carotid artery and its branches remain patent and well perfused. Vertebral arteries: Both vertebral arteries arise from the subclavian arteries. Vertebral arteries widely patent without stenosis or dissection. Skeleton: No visible acute osseous finding. No discrete or worrisome osseous lesions. Multilevel cervical spondylosis without high-grade spinal stenosis. Other neck: Diffuse soft tissue density and stranding seen surrounding the left carotid artery and left carotid sheath, most pronounced at the level of the bifurcation, likely reflecting postoperative changes. Suspected small amount of blood products within this region. Few scattered foci of soft tissue emphysema. No active contrast extravasation. Postoperative thickening and swelling seen within the overlying left sternocleidomastoid muscle and adjacent soft tissues. No frank hematoma formation. No other acute soft tissue abnormality within the neck. Upper chest: Visualized upper chest demonstrates no other acute finding. IMPRESSION: 1. Postoperative changes from recent left carotid endarterectomy. Diffuse soft tissue density and stranding surrounding the left carotid artery and left carotid sheath likely reflects postoperative changes. No active contrast extravasation, pseudoaneurysm formation, or frank hematoma formation. Previously seen left ICA stenosis is markedly improved, with no hemodynamically significant greater than 50% stenosis now seen. 2. Otherwise negative CTA of the neck. Electronically Signed   By: Jeannine Boga M.D.   On: 02/22/2021 22:57    Assessment & Plan:   Problem List Items Addressed This Visit     Hyperlipidemia LDL goal <100 (Chronic)    He has been tolerating initiation of atorvastatin twice weekly to regimen of daily zetia       Statin intolerance    He Is tolerating atorvastatin two days per week       Sinus bradycardia    Chronic and longstanding,  . Unclear if recent syncopal episode occurred  after a sinus pause .  Referring for Holter monitor       Irregular heart rhythm    Patient continues to have  PACs  In the setting of a recent syncopal episodes .  Lytes and thyroid function are normal.  Referral for holter monitor recommended and in progress       Carotid stenosis, symptomatic, with infarction [facility])    S/p left CEA on Dec 8.  Recent CT Angiogram done during ER evaluation for syncope was reviewed by Dr Delana Meyer and notes expected postoperative changes       Other Visit Diagnoses     Irregular heart rate    -  Primary   Relevant Orders   EKG 12-Lead (Completed)   Comprehensive metabolic panel (Completed)   TSH (Completed)   Magnesium (Completed)   Ambulatory referral to Cardiology   Cardiac arrhythmia due to premature depolarization, unspecified type       Syncope, unspecified syncope type  Relevant Orders   Ambulatory referral to Cardiology       I am having Tannon Peerson. Evitt maintain his acetaminophen, polycarbophil, Glucosamine-Chondroitin (GLUCOSAMINE CHONDR COMPLEX PO), atorvastatin, fluticasone, ezetimibe, aspirin, ALPRAZolam, and losartan.  No orders of the defined types were placed in this encounter.   I spent 40 minutes dedicated to the care of this patient on the date of this encounter to include pre-visit review of her medical history,  most recent imaging studies, Face-to-face time with the patient , and post visit ordering of testing and therapeutics.    Follow-up: No follow-ups on file.   Crecencio Mc, MD

## 2021-03-04 NOTE — Assessment & Plan Note (Signed)
He has been tolerating initiation of atorvastatin twice weekly to regimen of daily zetia

## 2021-03-04 NOTE — Patient Instructions (Addendum)
Reduce your losartan dose to 25 mg daily .  Continue the lower dose unless your readings begin to sty over 140/90, then resume 50 mg daily   I will contact Dr Mylo Red to set up a Holter monitor and try to get your appt moved up   60 ounces of water daily is your goal.  Ok to add a little salt to your diet

## 2021-03-04 NOTE — Assessment & Plan Note (Signed)
He Is tolerating atorvastatin two days per week

## 2021-03-05 ENCOUNTER — Other Ambulatory Visit: Payer: Self-pay

## 2021-03-05 ENCOUNTER — Ambulatory Visit (INDEPENDENT_AMBULATORY_CARE_PROVIDER_SITE_OTHER): Payer: Medicare Other

## 2021-03-05 ENCOUNTER — Telehealth: Payer: Self-pay

## 2021-03-05 DIAGNOSIS — I491 Atrial premature depolarization: Secondary | ICD-10-CM

## 2021-03-05 DIAGNOSIS — Z9889 Other specified postprocedural states: Secondary | ICD-10-CM

## 2021-03-05 DIAGNOSIS — R55 Syncope and collapse: Secondary | ICD-10-CM

## 2021-03-05 NOTE — Progress Notes (Signed)
Secure message sent from pt's PCP, Dr. Derrel Nip in reference to pt's OV today. Dr. Derrel Nip consulted Dr. Rockey Situ, since Dr. Garen Lah is out the office, ([place] primary cardiologist).   Per Dr. Derrel Nip: can I get A  Holter monitor put on him and then get him seen after the results are available for review?  patient had a witnessed syncopal episode 3 days post op from Left CEA.  probably hypotension induced aggravated by fasting state ,  but has baseline sinus  bradycardia and reported a rate of 100 this morning at home before he came in .  today's EKG showed a lot of PAC's   Dr. Rockey Situ verbalized Zio monitor for 14 days, may be order under Dr. Mylo Red as he is the primary cardiologist.   Elwyn Reach order placed, this RN will call pt to review monitor instructions, see future telephone encounter from today.

## 2021-03-05 NOTE — Telephone Encounter (Signed)
Refer to earlier "order encounter". Dr. Derrel Nip request heart monitor d/t   patient had a witnessed syncopal episode 3 days post op from Left CEA.  probably hypotension induced aggravated by fasting state ,  but has baseline sinus  bradycardia and reported a rate of 100 this morning at home before he came in .  today's EKG showed a lot of PAC's  Dr. Rockey Situ advised to order ZIO monitor for 14 day wear (can be order under Dr. Garen Lah, primary cardiologist, per Dr. Rockey Situ).   Called Mr. Stegman via phone, reviewed ZIO monitor indications from Dr. Derrel Nip. He should receive the monitor by mail to address on file. Educated Mr. Garciagarcia that the ZIO is an easy-to-wear, single-use heart monitor that records and stores every beat of your heart, whether you're sleeping, active, or sitting; it continues to record with any activity during your normal routine as long as you have it on. The two week heart rhythm recording should hopefully provide some insight as to why he has been experiencing some of the symptoms he has been having to see if it correlates with any abnormal activity with his heart rhythm outside the normal sinus rhythm. Mr. Lamorte verbalized understanding.  Reviewed basic instructions regarding the monitor and how to apply monitor once received:  Avoid showering during the first 24 hours of wearing the monitor. Avoid excessive sweating to help maximize wear time. Do not submerge the device, no hot tubs, and no swimming pools. Keep any lotions or oils away from the patch. After 24 hours you may shower with the patch on. Take brief showers with your back facing the shower head.  Do not remove patch once it has been placed because that will interrupt data and decrease adhesive wear time. Push the button when you have any symptoms and write down what you were feeling in dairy provider with monitor.  Once you have completed wearing your monitor, remove and place into box which has postage paid and place in  your outgoing mailbox.  For any monitor malfunction, sounds, blinking lights, please call the [place] at [phone]  Mr. Pfenning verbalized understanding. Otherwise all questions were address and no additional concerns at this time. Mr. Saber thankful for reaching out to him regarding ZIO monitor. Will call the office for any concerns or issues. Understands to call ZIO for any complications or malfunction with monitor

## 2021-03-10 DIAGNOSIS — Z9889 Other specified postprocedural states: Secondary | ICD-10-CM

## 2021-03-10 DIAGNOSIS — I491 Atrial premature depolarization: Secondary | ICD-10-CM

## 2021-03-10 DIAGNOSIS — R55 Syncope and collapse: Secondary | ICD-10-CM

## 2021-03-18 ENCOUNTER — Encounter: Payer: Self-pay | Admitting: Vascular Surgery

## 2021-03-26 DIAGNOSIS — R55 Syncope and collapse: Secondary | ICD-10-CM | POA: Diagnosis not present

## 2021-03-26 DIAGNOSIS — I491 Atrial premature depolarization: Secondary | ICD-10-CM | POA: Diagnosis not present

## 2021-03-26 DIAGNOSIS — Z9889 Other specified postprocedural states: Secondary | ICD-10-CM | POA: Diagnosis not present

## 2021-03-31 DIAGNOSIS — H2512 Age-related nuclear cataract, left eye: Secondary | ICD-10-CM | POA: Diagnosis not present

## 2021-03-31 DIAGNOSIS — H2513 Age-related nuclear cataract, bilateral: Secondary | ICD-10-CM | POA: Diagnosis not present

## 2021-03-31 DIAGNOSIS — I10 Essential (primary) hypertension: Secondary | ICD-10-CM | POA: Diagnosis not present

## 2021-04-06 ENCOUNTER — Other Ambulatory Visit: Payer: Self-pay | Admitting: Internal Medicine

## 2021-04-09 ENCOUNTER — Ambulatory Visit (INDEPENDENT_AMBULATORY_CARE_PROVIDER_SITE_OTHER): Payer: Medicare Other

## 2021-04-09 VITALS — Ht 72.0 in | Wt 202.0 lb

## 2021-04-09 DIAGNOSIS — Z0000 Encounter for general adult medical examination without abnormal findings: Secondary | ICD-10-CM

## 2021-04-09 NOTE — Progress Notes (Addendum)
Subjective:   Bobby Murillo is a 81 y.o. male who presents for Medicare Annual/Subsequent preventive examination.  Review of Systems    No ROS.  Medicare Wellness Virtual Visit.  Visual/audio telehealth visit, UTA vital signs.   See social history for additional risk factors.   Cardiac Risk Factors include: advanced age (>65men, >82 women);male gender     Objective:    Today's Vitals   04/09/21 0901  Weight: 202 lb (91.6 kg)  Height: 6' (1.829 m)   Body mass index is 27.4 kg/m.  Advanced Directives 04/09/2021 02/22/2021 02/19/2021 02/14/2021 01/04/2021 01/03/2021 04/08/2020  Does Patient Have a Medical Advance Directive? Yes Yes Yes Yes Yes No Yes  Type of Paramedic of [place];Living will [place];Living will [place];Living will - [place];Living will - [place];Living will  Does patient want to make changes to medical advance directive? No - Patient declined No - Patient declined No - Patient declined - No - Patient declined - No - Patient declined  Copy of [place] in Chart? No - copy requested No - copy requested No - copy requested - No - copy requested - No - copy requested  Would patient like information on creating a medical advance directive? - No - Patient declined - - - No - Patient declined -    Current Medications (verified) Outpatient Encounter Medications as of 04/09/2021  Medication Sig   acetaminophen (TYLENOL) 325 MG tablet Take 650 mg by mouth every 6 (six) hours as needed for moderate pain.   ALPRAZolam (XANAX) 0.5 MG tablet TAKE 1 TABLET BY MOUTH AT BEDTIME AS NEEDED FOR SLEEP (Patient taking differently: Take 0.25 mg by mouth at bedtime.)   aspirin EC 81 MG EC tablet Take 1 tablet (81 mg total) by mouth daily. Swallow whole.   atorvastatin (LIPITOR) 20 MG tablet Take 1 tablet (20 mg total) by mouth 2 (two) times a week.   ezetimibe  (ZETIA) 10 MG tablet Take 1 tablet by mouth once daily   fluticasone (FLONASE) 50 MCG/ACT nasal spray Place 2 sprays into both nostrils daily.   Glucosamine-Chondroitin (GLUCOSAMINE CHONDR COMPLEX PO) Take 1 tablet by mouth daily. tab by mouth daily   losartan (COZAAR) 50 MG tablet Take 1 tablet by mouth once daily   polycarbophil (FIBERCON) 625 MG tablet Take 625 mg by mouth daily.   No facility-administered encounter medications on file as of 04/09/2021.    Allergies (verified) Alfuzosin and Pravastatin sodium   History: Past Medical History:  Diagnosis Date   Abnormal LFTs    Anxiety    Aortic atherosclerosis (HCC)    Arthritis    Basal cell carcinoma 09/14/2010   left calf    Bilateral carotid artery stenosis    a.) Carotid doppler 57/03/7791: 90-30% LICA; 0-92% RICA. b.) CTA neck 01/30/2021: critical stenosis LEFT carotid bulb.   CKD (chronic kidney disease), stage III ([place])    History of kidney stones    Hyperlipidemia    Hypertension    Long term current use of antithrombotics/antiplatelets    a.) DAPT therapy (ASA + clopidogrel)   Prostate cancer ([place]) 03/16/2008   Dr. Ronny Bacon   Sinus bradycardia by electrocardiogram    Stroke ([place]) 01/03/2021   a.) Acute infarction affecting the cortex. ?? incidental punctate acute white matter infarction just posterior to the atrium of the left lateral ventricle. Stenosis of the right M2 branch inferior division. Severe atherosclerotic  disease of both PCAs with severe stenoses.   Past Surgical History:  Procedure Laterality Date   COLONOSCOPY     ENDARTERECTOMY Left 02/19/2021   Procedure: ENDARTERECTOMY CAROTID;  Surgeon: Katha Cabal, MD;  Location: [facility] ORS;  Service: Vascular;  Laterality: Left;   PROSTATECTOMY  11/14/2008   transurethral, radical, Dr. Darcus Austin   Family History  Problem Relation Age of Onset   Alzheimer's disease Mother    Stroke Father    Prostate cancer Father    Alzheimer's disease Sister    Stroke  Paternal Aunt    Heart disease Paternal Uncle 68   Social History   Socioeconomic History   Marital status: Married    Spouse name: [name]   Number of children: Not on file   Years of education: Not on file   Highest education level: Not on file  Occupational History   Not on file  Tobacco Use   Smoking status: Never   Smokeless tobacco: Never  Vaping Use   Vaping Use: Never used  Substance and Sexual Activity   Alcohol use: Yes    Alcohol/week: 8.0 standard drinks    Types: 4 Glasses of wine, 4 Shots of liquor per week    Comment: occasional   Drug use: No   Sexual activity: Yes  Other Topics Concern   Not on file  Social History Narrative   Patient lives in [place] with his wife.  Non-smoker.  Drinks over the weekend.  Retired from [employer].      Right handed   Social Determinants of Health   Financial Resource Strain: Low Risk    Difficulty of Paying Living Expenses: Not hard at all  Food Insecurity: No Food Insecurity   Worried About Charity fundraiser in the Last Year: Never true   Ran Out of Food in the Last Year: Never true  Transportation Needs: No Transportation Needs   Lack of Transportation (Medical): No   Lack of Transportation (Non-Medical): No  Physical Activity: Insufficiently Active   Days of Exercise per Week: 1 day   Minutes of Exercise per Session: 90 min  Stress: No Stress Concern Present   Feeling of Stress : Not at all  Social Connections: Unknown   Frequency of Communication with Friends and Family: More than three times a week   Frequency of Social Gatherings with Friends and Family: Once a week   Attends Religious Services: Not on Electrical engineer or Organizations: Yes   Attends Archivist Meetings: Not on file   Marital Status: Married    Tobacco Counseling Counseling given: Not Answered   Clinical Intake:  Pre-visit preparation completed: Yes        Diabetes: No  How often do you need to have someone  help you when you read instructions, pamphlets, or other written materials from your doctor or pharmacy?: 1 - Never   Interpreter Needed?: No      Activities of Daily Living In your present state of health, do you have any difficulty performing the following activities: 04/09/2021 02/20/2021  Hearing? N N  Vision? N N  Difficulty concentrating or making decisions? N N  Walking or climbing stairs? N N  Dressing or bathing? N N  Doing errands, shopping? N N  Preparing Food and eating ? N -  Using the Toilet? N -  In the past six months, have you accidently leaked urine? N -  Do you have problems with loss of  bowel control? N -  Managing your Medications? N -  Managing your Finances? N -  Housekeeping or managing your Housekeeping? N -  Some recent data might be hidden    Patient Care Team: Crecencio Mc, MD as PCP - General (Internal Medicine) Kate Sable, MD as Consulting Physician (Cardiology)  Indicate any recent Medical Services you may have received from other than [facility] providers in the past year (date may be approximate).     Assessment:   This is a routine wellness examination for Bobby Murillo.  Virtual Visit via Telephone Note  I connected with  Bobby Murillo on 04/09/21 at  9:00 AM EST by telephone and verified that I am speaking with the correct person using two identifiers.  Persons participating in the virtual visit: patient/Nurse Health Advisor   I discussed the limitations, risks, security and privacy concerns of performing an evaluation and management service by telephone and the availability of in person appointments. The patient expressed understanding and agreed to proceed.  Interactive audio and video telecommunications were attempted between this nurse and patient, however failed, due to patient having technical difficulties OR patient did not have access to video capability.  We continued and completed visit with audio only.  Some vital signs may be  absent or patient reported.   Hearing/Vision screen Hearing Screening - Comments:: Patient is able to hear conversational tones without difficulty. No issues reported. Vision Screening - Comments:: Followed by [facility] Wears corrective lenses Annual visits  They have regular follow up with the ophthalmologist  Dietary issues and exercise activities discussed: Current Exercise Habits: Home exercise routine, Intensity: Mild Regular diet Good water intake   Goals Addressed               This Visit's Progress     Patient Stated     Increase physical activity (pt-stated)        Stay as active as tolerated       Depression Screen PHQ 2/9 Scores 04/09/2021 03/04/2021 01/08/2021 12/23/2020 04/24/2020 04/08/2020 12/15/2019  PHQ - 2 Score 0 0 0 0 0 0 0  PHQ- 9 Score - - - - - - -    Fall Risk Fall Risk  04/09/2021 03/04/2021 01/27/2021 01/08/2021 12/23/2020  Falls in the past year? 0 0 0 0 0  Number falls in past yr: 0 - 0 0 0  Injury with Fall? - - 0 0 0  Risk for fall due to : - No Fall Risks - No Fall Risks -  Risk for fall due to: Comment - - - - -  Follow up Falls evaluation completed Falls evaluation completed - Falls evaluation completed Falls evaluation completed    FALL RISK PREVENTION PERTAINING TO THE HOME: Home free of loose throw rugs in walkways, pet beds, electrical cords, etc? Yes  Adequate lighting in your home to reduce risk of falls? Yes   ASSISTIVE DEVICES UTILIZED TO PREVENT FALLS: Life alert? No  Use of a cane, walker or w/c? No  Grab bars in the bathroom? No  Shower chair or bench in shower? No  Elevated toilet seat or a handicapped toilet? No   TIMED UP AND GO: Was the test performed? No .   Cognitive Function: Patient is alert and oriented x3.   Enjoys playing brain health stimulating games.  MMSE - Mini Mental State Exam 04/09/2021  Not completed: Unable to complete    6CIT Screen 04/08/2020 04/06/2019 04/04/2018 03/29/2017  What  Year? 0  points 0 points 0 points 0 points  What month? 0 points 0 points 0 points 0 points  What time? 0 points 0 points 0 points 0 points  Count back from 20 0 points 0 points 0 points 0 points  Months in reverse 0 points 0 points 0 points 0 points  Repeat phrase 0 points 0 points 0 points 0 points  Total Score 0 0 0 0   Immunizations Immunization History  Administered Date(s) Administered   Fluad Quad(high Dose 65+) 12/15/2019, 12/23/2020   Influenza Split 02/13/2011   Influenza, High Dose Seasonal PF 12/30/2015, 01/07/2018   Influenza,inj,Quad PF,6+ Mos 11/08/2013, 11/30/2014, 03/29/2017   Influenza-Unspecified 12/30/2012   PFIZER(Purple Top)SARS-COV-2 Vaccination 03/20/2019, 04/10/2019, 02/13/2020, 09/30/2020   Pneumococcal Conjugate-13 11/08/2013   Pneumococcal Polysaccharide-23 02/13/2011, 02/07/2018   Tdap 10/15/2012   Shingrix Completed?: No.    Education has been provided regarding the importance of this vaccine. Patient has been advised to call insurance company to determine out of pocket expense if they have not yet received this vaccine. Advised may also receive vaccine at local pharmacy or Health Dept. Verbalized acceptance and understanding.  Screening Tests Health Maintenance  Topic Date Due   COVID-19 Vaccine (5 - Booster for Pfizer series) 04/25/2021 (Originally 11/25/2020)   Zoster Vaccines- Shingrix (1 of 2) 07/08/2021 (Originally 06/14/1959)   TETANUS/TDAP  10/16/2022   Pneumonia Vaccine 66+ Years old  Completed   INFLUENZA VACCINE  Completed   Hepatitis C Screening  Completed   HPV VACCINES  Aged Out   Health Maintenance There are no preventive care reminders to display for this patient.  Lung Cancer Screening: (Low Dose CT Chest recommended if Age 17-80 years, 30 pack-year currently smoking OR have quit w/in 15years.) does not qualify.   Hepatitis C Screening: does not qualify  Vision Screening: Recommended annual ophthalmology exams for early detection of  glaucoma and other disorders of the eye. Cataract surgery scheduled 05/2021.   Dental Screening: Recommended annual dental exams for proper oral hygiene  Community Resource Referral / Chronic Care Management: CRR required this visit?  No   CCM required this visit?  No      Plan:   Keep all routine maintenance appointments.   I have personally reviewed and noted the following in the patients chart:   Medical and social history Use of alcohol, tobacco or illicit drugs  Current medications and supplements including opioid prescriptions. Patient is not currently taking opioid prescriptions. Functional ability and status Nutritional status Physical activity Advanced directives List of other physicians Hospitalizations, surgeries, and ER visits in previous 12 months Vitals Screenings to include cognitive, depression, and falls Referrals and appointments  In addition, I have reviewed and discussed with patient certain preventive protocols, quality metrics, and best practice recommendations. A written personalized care plan for preventive services as well as general preventive health recommendations were provided to patient.     OBrien-Blaney, Rhyan Radler L, LPN   [id number]/93/[id number]    I have reviewed the above information and agree with above.   Deborra Medina, MD

## 2021-04-09 NOTE — Patient Instructions (Addendum)
[name] , Thank you for taking time to come for your Medicare Wellness Visit. I appreciate your ongoing commitment to your health goals. Please review the following plan we discussed and let me know if I can assist you in the future.   These are the goals we discussed:  Goals       Patient Stated     Increase physical activity (pt-stated)      Stay as active as tolerated        This is a list of the screening recommended for you and due dates:  Health Maintenance  Topic Date Due   COVID-19 Vaccine (5 - Booster for Pfizer series) 04/25/2021*   Zoster (Shingles) Vaccine (1 of 2) 07/08/2021*   Tetanus Vaccine  10/16/2022   Pneumonia Vaccine  Completed   Flu Shot  Completed   Hepatitis C Screening: USPSTF Recommendation to screen - Ages 18-79 yo.  Completed   HPV Vaccine  Aged Out  *Topic was postponed. The date shown is not the original due date.    Advanced directives: End of life planning; Advance aging; Advanced directives discussed.  Copy of current HCPOA/Living Will requested.    Conditions/risks identified: none new  Follow up in one year for your annual wellness visit.   Preventive Care 34 Years and Older, Male Preventive care refers to lifestyle choices and visits with your health care provider that can promote health and wellness. What does preventive care include? A yearly physical exam. This is also called an annual well check. Dental exams once or twice a year. Routine eye exams. Ask your health care provider how often you should have your eyes checked. Personal lifestyle choices, including: Daily care of your teeth and gums. Regular physical activity. Eating a healthy diet. Avoiding tobacco and drug use. Limiting alcohol use. Practicing safe sex. Taking low doses of aspirin every day. Taking vitamin and mineral supplements as recommended by your health care provider. What happens during an annual well check? The services and screenings done by your health  care provider during your annual well check will depend on your age, overall health, lifestyle risk factors, and family history of disease. Counseling  Your health care provider may ask you questions about your: Alcohol use. Tobacco use. Drug use. Emotional well-being. Home and relationship well-being. Sexual activity. Eating habits. History of falls. Memory and ability to understand (cognition). Work and work Statistician. Screening  You may have the following tests or measurements: Height, weight, and BMI. Blood pressure. Lipid and cholesterol levels. These may be checked every 5 years, or more frequently if you are over 8 years old. Skin check. Lung cancer screening. You may have this screening every year starting at age 39 if you have a 30-pack-year history of smoking and currently smoke or have quit within the past 15 years. Fecal occult blood test (FOBT) of the stool. You may have this test every year starting at age 37. Flexible sigmoidoscopy or colonoscopy. You may have a sigmoidoscopy every 5 years or a colonoscopy every 10 years starting at age 77. Prostate cancer screening. Recommendations will vary depending on your family history and other risks. Hepatitis C blood test. Hepatitis B blood test. Sexually transmitted disease (STD) testing. Diabetes screening. This is done by checking your blood sugar (glucose) after you have not eaten for a while (fasting). You may have this done every 1-3 years. Abdominal aortic aneurysm (AAA) screening. You may need this if you are a current or former smoker. Osteoporosis. You  may be screened starting at age 13 if you are at high risk. Talk with your health care provider about your test results, treatment options, and if necessary, the need for more tests. Vaccines  Your health care provider may recommend certain vaccines, such as: Influenza vaccine. This is recommended every year. Tetanus, diphtheria, and acellular pertussis (Tdap, Td)  vaccine. You may need a Td booster every 10 years. Zoster vaccine. You may need this after age 56. Pneumococcal 13-valent conjugate (PCV13) vaccine. One dose is recommended after age 66. Pneumococcal polysaccharide (PPSV23) vaccine. One dose is recommended after age 12. Talk to your health care provider about which screenings and vaccines you need and how often you need them. This information is not intended to replace advice given to you by your health care provider. Make sure you discuss any questions you have with your health care provider. Document Released: 03/29/2015 Document Revised: 11/20/2015 Document Reviewed: 01/01/2015 Elsevier Interactive Patient Education  2017 [employer] Prevention in the Home Falls can cause injuries. They can happen to people of all ages. There are many things you can do to make your home safe and to help prevent falls. What can I do on the outside of my home? Regularly fix the edges of walkways and driveways and fix any cracks. Remove anything that might make you trip as you walk through a door, such as a raised step or threshold. Trim any bushes or trees on the path to your home. Use bright outdoor lighting. Clear any walking paths of anything that might make someone trip, such as rocks or tools. Regularly check to see if handrails are loose or broken. Make sure that both sides of any steps have handrails. Any raised decks and porches should have guardrails on the edges. Have any leaves, snow, or ice cleared regularly. Use sand or salt on walking paths during winter. Clean up any spills in your garage right away. This includes oil or grease spills. What can I do in the bathroom? Use night lights. Install grab bars by the toilet and in the tub and shower. Do not use towel bars as grab bars. Use non-skid mats or decals in the tub or shower. If you need to sit down in the shower, use a plastic, non-slip stool. Keep the floor dry. Clean up any  water that spills on the floor as soon as it happens. Remove soap buildup in the tub or shower regularly. Attach bath mats securely with double-sided non-slip rug tape. Do not have throw rugs and other things on the floor that can make you trip. What can I do in the bedroom? Use night lights. Make sure that you have a light by your bed that is easy to reach. Do not use any sheets or blankets that are too big for your bed. They should not hang down onto the floor. Have a firm chair that has side arms. You can use this for support while you get dressed. Do not have throw rugs and other things on the floor that can make you trip. What can I do in the kitchen? Clean up any spills right away. Avoid walking on wet floors. Keep items that you use a lot in easy-to-reach places. If you need to reach something above you, use a strong step stool that has a grab bar. Keep electrical cords out of the way. Do not use floor polish or wax that makes floors slippery. If you must use wax, use non-skid floor wax. Do  not have throw rugs and other things on the floor that can make you trip. What can I do with my stairs? Do not leave any items on the stairs. Make sure that there are handrails on both sides of the stairs and use them. Fix handrails that are broken or loose. Make sure that handrails are as long as the stairways. Check any carpeting to make sure that it is firmly attached to the stairs. Fix any carpet that is loose or worn. Avoid having throw rugs at the top or bottom of the stairs. If you do have throw rugs, attach them to the floor with carpet tape. Make sure that you have a light switch at the top of the stairs and the bottom of the stairs. If you do not have them, ask someone to add them for you. What else can I do to help prevent falls? Wear shoes that: Do not have high heels. Have rubber bottoms. Are comfortable and fit you well. Are closed at the toe. Do not wear sandals. If you use a  stepladder: Make sure that it is fully opened. Do not climb a closed stepladder. Make sure that both sides of the stepladder are locked into place. Ask someone to hold it for you, if possible. Clearly mark and make sure that you can see: Any grab bars or handrails. First and last steps. Where the edge of each step is. Use tools that help you move around (mobility aids) if they are needed. These include: Canes. Walkers. Scooters. Crutches. Turn on the lights when you go into a dark area. Replace any light bulbs as soon as they burn out. Set up your furniture so you have a clear path. Avoid moving your furniture around. If any of your floors are uneven, fix them. If there are any pets around you, be aware of where they are. Review your medicines with your doctor. Some medicines can make you feel dizzy. This can increase your chance of falling. Ask your doctor what other things that you can do to help prevent falls. This information is not intended to replace advice given to you by your health care provider. Make sure you discuss any questions you have with your health care provider. Document Released: 12/27/2008 Document Revised: 08/08/2015 Document Reviewed: 04/06/2014 Elsevier Interactive Patient Education  2017 [employer].

## 2021-05-05 ENCOUNTER — Telehealth: Payer: Self-pay

## 2021-05-05 ENCOUNTER — Encounter: Payer: Self-pay | Admitting: Cardiology

## 2021-05-05 ENCOUNTER — Ambulatory Visit: Payer: Medicare Other | Admitting: Cardiology

## 2021-05-05 ENCOUNTER — Other Ambulatory Visit: Payer: Self-pay

## 2021-05-05 VITALS — BP 120/74 | HR 69 | Ht 73.0 in | Wt 210.0 lb

## 2021-05-05 DIAGNOSIS — E7800 Pure hypercholesterolemia, unspecified: Secondary | ICD-10-CM

## 2021-05-05 DIAGNOSIS — I10 Essential (primary) hypertension: Secondary | ICD-10-CM | POA: Diagnosis not present

## 2021-05-05 DIAGNOSIS — R2689 Other abnormalities of gait and mobility: Secondary | ICD-10-CM

## 2021-05-05 MED ORDER — PRALUENT 150 MG/ML ~~LOC~~ SOAJ: 150.0000 mg | SUBCUTANEOUS | 6 refills | Status: DC

## 2021-05-05 MED ORDER — LOSARTAN POTASSIUM 25 MG PO TABS: 25.0000 mg | ORAL_TABLET | Freq: Every day | ORAL | 1 refills | Status: DC

## 2021-05-05 NOTE — Patient Instructions (Signed)
Medication Instructions:  - Your physician has recommended you make the following change in your medication:   1) DECREASE losartan to 25 mg: - take 1 tablet by mouth once daily  2) START Praluent 150 mg: - inject into the skin every 14 days  *If you need a refill on your cardiac medications before your next appointment, please call your pharmacy*   Lab Work: - none ordered  If you have labs (blood work) drawn today and your tests are completely normal, you will receive your results only by: [place] (if you have MyChart) OR A paper copy in the mail If you have any lab test that is abnormal or we need to change your treatment, we will call you to review the results.   Testing/Procedures: - none ordered   Follow-Up: At [facility], you and your health needs are our priority.  As part of our continuing mission to provide you with exceptional heart care, we have created designated Provider Care Teams.  These Care Teams include your primary Cardiologist (physician) and Advanced Practice Providers (APPs -  Physician Assistants and Nurse Practitioners) who all work together to provide you with the care you need, when you need it.  We recommend signing up for the patient portal called "MyChart".  Sign up information is provided on this After Visit Summary.  MyChart is used to connect with patients for Virtual Visits (Telemedicine).  Patients are able to view lab/test results, encounter notes, upcoming appointments, etc.  Non-urgent messages can be sent to your provider as well.   To learn more about what you can do with MyChart, go to [email or web address].    Your next appointment:   6 week(s)  The format for your next appointment:   In Person  Provider:   Kate Sable, MD (only)}    Other Instructions  Praluent (Alirocumab) injection What is this medication? ALIROCUMAB (al i ROC ue mab) is known as a PCSK9 inhibitor. It is used to lower the level of  cholesterol in the blood. It may be used alone or in combination with other cholesterol-lowering drugs. This drug may also be used to reduce the risk of heart attack, stroke, and certain types of chest pain (unstable angina) that may need hospitalization. This medicine may be used for other purposes; ask your health care provider or pharmacist if you have questions. COMMON BRAND NAME(S): Praluent What should I tell my care team before I take this medication? They need to know if you have any of these conditions: any unusual or allergic reaction to alirocumab, other medicines, foods, dyes, or preservatives pregnant or trying to get pregnant breast-feeding How should I use this medication? This medicine is for injection under the skin. You will be taught how to prepare and give this medicine. Use exactly as directed. Take your medicine at regular intervals. Do not take your medicine more often than directed. It is important that you put your used needles and syringes in a special sharps container. Do not put them in a trash can. If you do not have a sharps container, call your pharmacist or healthcare provider to get one. Talk to your pediatrician regarding the use of this medicine in children. Special care may be needed. Overdosage: If you think you have taken too much of this medicine contact a poison control center or emergency room at once. NOTE: This medicine is only for you. Do not share this medicine with others. What if I miss a dose? If you are on  an every 2 week schedule and miss a dose, take it as soon as you can. If your next dose is to be taken in less than 7 days, then do not take the missed dose. Take the next dose at your regular time. Do not take double or extra doses. If you are on a monthly schedule and miss a dose, take it as soon as you can. If the dose given is within 7 days of the missed dose, continue with your regular monthly schedule. If the missed dose is administered after 7  days of the original date, administer the dose and start a new monthly schedule based on this date. What may interact with this medication? Interactions are not expected. This list may not describe all possible interactions. Give your health care provider a list of all the medicines, herbs, non-prescription drugs, or dietary supplements you use. Also tell them if you smoke, drink alcohol, or use illegal drugs. Some items may interact with your medicine. What should I watch for while using this medication? You may need blood work done while you are taking this medicine. What side effects may I notice from receiving this medication? Side effects that you should report to your doctor or health care professional as soon as possible: allergic reactions like skin rash, itching or hives, swelling of the face, lips, or tongue signs and symptoms of infection like fever or chills; cough; sore throat; pain or trouble passing urine signs and symptoms of liver injury like dark yellow or brown urine; general ill feeling or flu-like symptoms; light-colored stools; loss of appetite; nausea; right upper belly pain; unusually weak or tired; yellowing of the eyes or skin Side effects that usually do not require medical attention (report to your doctor or health care professional if they continue or are bothersome): diarrhea muscle cramps muscle pain pain, redness, or irritation at site where injected This list may not describe all possible side effects. Call your doctor for medical advice about side effects. You may report side effects to FDA at 1-800-FDA-1088. Where should I keep my medication? Keep out of the reach of children. You will be instructed on how to store this medicine. Throw away any unused medicine after the expiration date on the label. NOTE: This sheet is a summary. It may not cover all possible information. If you have questions about this medicine, talk to your doctor, pharmacist, or health care  provider.  2022 Elsevier/Gold Standard (2017-07-16 00:00:00)

## 2021-05-05 NOTE — Telephone Encounter (Signed)
Request Reference Number: [id number]. PRALUENT INJ 150MG /ML is approved through 11/02/2021. Your patient may now fill this prescription and it will be covered.

## 2021-05-05 NOTE — Progress Notes (Signed)
Cardiology Office Note:    Date:  05/05/2021   ID:  Bobby Murillo, DOB Jul 15, 1940, [name] [id number]  PCP:  Crecencio Mc, MD   [facility] Providers Cardiologist:  None     Referring MD: Crecencio Mc, MD   Chief Complaint  Patient presents with   Other    3 month follow up -- Patient c.o a little SOB. Meds reviewed verbally with patient.     History of Present Illness:    Bobby Murillo is a 81 y.o. male with a hx of hyperlipidemia, hypertension, CKD 3, CVA , carotid artery stenosis s/p CEA 02/2021 who presents for follow-up.  Previously seen for preop evaluation due to left ICA stenosis and hyperlipidemia.  Underwent CEA of the left carotid artery successfully.  Takes aspirin 81 mg, Lipitor, Zetia.  Patient has myalgias with statins.  Currently takes Lipitor 20 mg twice a week.  Patient had a syncopal episode 3 days postop from left CEA.  Etiology included hypotension, vasovagal.  Cardiac monitor was placed to evaluate any significant arrhythmias.  He states having occasional nonspecific mobility issues/wobbly gait which started before his surgery, has persisted.    Prior notes Echocardiogram 01/04/2021 showed normal ejection fraction EF 55 to 60%, no gross structural abnormalities. Lexiscan Myoview 01/2021 no evidence for ischemia, minimal coronary calcifications Cardiac monitor 03/2018 20, frequent PACs,   Past Medical History:  Diagnosis Date   Abnormal LFTs    Anxiety    Aortic atherosclerosis (HCC)    Arthritis    Basal cell carcinoma 09/14/2010   left calf    Bilateral carotid artery stenosis    a.) Carotid doppler 29/79/8921: 19-41% LICA; 7-40% RICA. b.) CTA neck 01/30/2021: critical stenosis LEFT carotid bulb.   CKD (chronic kidney disease), stage III ([place])    History of kidney stones    Hyperlipidemia    Hypertension    Long term current use of antithrombotics/antiplatelets    a.) DAPT therapy (ASA + clopidogrel)   Prostate cancer ([place]) 03/16/2008   Dr.  Ronny Bacon   Sinus bradycardia by electrocardiogram    Stroke ([place]) 01/03/2021   a.) Acute infarction affecting the cortex. ?? incidental punctate acute white matter infarction just posterior to the atrium of the left lateral ventricle. Stenosis of the right M2 branch inferior division. Severe atherosclerotic disease of both PCAs with severe stenoses.    Past Surgical History:  Procedure Laterality Date   COLONOSCOPY     ENDARTERECTOMY Left 02/19/2021   Procedure: ENDARTERECTOMY CAROTID;  Surgeon: Katha Cabal, MD;  Location: [facility] ORS;  Service: Vascular;  Laterality: Left;   PROSTATECTOMY  11/14/2008   transurethral, radical, Dr. Darcus Austin    Current Medications: Current Meds  Medication Sig   acetaminophen (TYLENOL) 325 MG tablet Take 650 mg by mouth every 6 (six) hours as needed for moderate pain.   Alirocumab (PRALUENT) 150 MG/ML SOAJ Inject 150 mg into the skin every 14 (fourteen) days.   ALPRAZolam (XANAX) 0.5 MG tablet TAKE 1 TABLET BY MOUTH AT BEDTIME AS NEEDED FOR SLEEP   aspirin EC 81 MG EC tablet Take 1 tablet (81 mg total) by mouth daily. Swallow whole.   atorvastatin (LIPITOR) 20 MG tablet Take 1 tablet (20 mg total) by mouth 2 (two) times a week.   ezetimibe (ZETIA) 10 MG tablet Take 1 tablet by mouth once daily   fluticasone (FLONASE) 50 MCG/ACT nasal spray Place 2 sprays into both nostrils daily.   Glucosamine-Chondroitin (GLUCOSAMINE CHONDR COMPLEX PO) Take  1 tablet by mouth daily. tab by mouth daily   losartan (COZAAR) 25 MG tablet Take 1 tablet (25 mg total) by mouth daily.   polycarbophil (FIBERCON) 625 MG tablet Take 625 mg by mouth daily.   [DISCONTINUED] losartan (COZAAR) 50 MG tablet Take 1 tablet by mouth once daily     Allergies:   Alfuzosin and Pravastatin sodium   Social History   Socioeconomic History   Marital status: Married    Spouse name: [name]   Number of children: Not on file   Years of education: Not on file   Highest education level: Not on file   Occupational History   Not on file  Tobacco Use   Smoking status: Never   Smokeless tobacco: Never  Vaping Use   Vaping Use: Never used  Substance and Sexual Activity   Alcohol use: Yes    Alcohol/week: 8.0 standard drinks    Types: 4 Glasses of wine, 4 Shots of liquor per week    Comment: occasional   Drug use: No   Sexual activity: Yes  Other Topics Concern   Not on file  Social History Narrative   Patient lives in [place] with his wife.  Non-smoker.  Drinks over the weekend.  Retired from [employer].      Right handed   Social Determinants of Health   Financial Resource Strain: Low Risk    Difficulty of Paying Living Expenses: Not hard at all  Food Insecurity: No Food Insecurity   Worried About Charity fundraiser in the Last Year: Never true   Ran Out of Food in the Last Year: Never true  Transportation Needs: No Transportation Needs   Lack of Transportation (Medical): No   Lack of Transportation (Non-Medical): No  Physical Activity: Insufficiently Active   Days of Exercise per Week: 1 day   Minutes of Exercise per Session: 90 min  Stress: No Stress Concern Present   Feeling of Stress : Not at all  Social Connections: Unknown   Frequency of Communication with Friends and Family: More than three times a week   Frequency of Social Gatherings with Friends and Family: Once a week   Attends Religious Services: Not on Electrical engineer or Organizations: Yes   Attends Archivist Meetings: Not on file   Marital Status: Married     Family History: The patient's family history includes Alzheimer's disease in his mother and sister; Heart disease (age of onset: 25) in his paternal uncle; Prostate cancer in his father; Stroke in his father and paternal aunt.  ROS:   Please see the history of present illness.     All other systems reviewed and are negative.  EKGs/Labs/Other Studies Reviewed:    The following studies were reviewed today:   EKG:  EKG  is  ordered today.  The ekg ordered today demonstrates sinus rhythm, PACs, otherwise normal ECG.  Recent Labs: 02/22/2021: Hemoglobin 14.4; Platelets 232 03/04/2021: ALT 23; BUN 17; Creatinine, Ser 1.28; Magnesium 2.3; Potassium 4.3; Sodium 139; TSH 3.27  Recent Lipid Panel    Component Value Date/Time   CHOL 186 01/03/2021 1500   CHOL 280 (H) 10/23/2011 1142   TRIG 93 01/03/2021 1500   HDL 64 01/03/2021 1500   HDL 63 10/23/2011 1142   CHOLHDL 2.9 01/03/2021 1500   VLDL 19 01/03/2021 1500   LDLCALC 103 (H) 01/03/2021 1500   LDLCALC 192 (H) 10/23/2011 1142   LDLDIRECT 184.0 12/30/2015 3235  Risk Assessment/Calculations:          Physical Exam:    VS:  BP 120/74 (BP Location: Left Arm, Patient Position: Sitting, Cuff Size: Normal)    Pulse 69    Ht 6\' 1"  (1.854 m)    Wt 210 lb (95.3 kg)    SpO2 96%    BMI 27.71 kg/m     Wt Readings from Last 3 Encounters:  05/05/21 210 lb (95.3 kg)  04/09/21 202 lb (91.6 kg)  03/04/21 202 lb 9.6 oz (91.9 kg)     GEN:  Well nourished, well developed in no acute distress HEENT: Normal NECK: Left carotid bruit noted CARDIAC: RRR, no murmurs, rubs, gallops RESPIRATORY:  Clear to auscultation without rales, wheezing or rhonchi  ABDOMEN: Soft, non-tender, non-distended MUSCULOSKELETAL:  No edema; No deformity  SKIN: Warm and dry NEUROLOGIC:  Alert and oriented x 3 PSYCHIATRIC:  Normal affect   ASSESSMENT:    1. Primary hypertension   2. Pure hypercholesterolemia   3. Balance problem     PLAN:    In order of problems listed above:  Hypertension, BP control, reduce losartan to 25 mg daily due to balance issues. Hyperlipidemia, statin allergies, LDL not at goal.  Start PCSK9. Balance issues, wobbly gait, unsure if BP medication is contributing.   Cardiac monitor with no significant arrhythmias.  Reduce losartan as above.  Talk to PCP regarding physical therapy, ENT eval.  Follow-up in 6 weeks.     Medication Adjustments/Labs  and Tests Ordered: Current medicines are reviewed at length with the patient today.  Concerns regarding medicines are outlined above.  Orders Placed This Encounter  Procedures   EKG 12-Lead   Meds ordered this encounter  Medications   Alirocumab (PRALUENT) 150 MG/ML SOAJ    Sig: Inject 150 mg into the skin every 14 (fourteen) days.    Dispense:  2 mL    Refill:  6   losartan (COZAAR) 25 MG tablet    Sig: Take 1 tablet (25 mg total) by mouth daily.    Dispense:  90 tablet    Refill:  1    Dose decrease     Patient Instructions  Medication Instructions:  - Your physician has recommended you make the following change in your medication:   1) DECREASE losartan to 25 mg: - take 1 tablet by mouth once daily  2) START Praluent 150 mg: - inject into the skin every 14 days  *If you need a refill on your cardiac medications before your next appointment, please call your pharmacy*   Lab Work: - none ordered  If you have labs (blood work) drawn today and your tests are completely normal, you will receive your results only by: MyChart Message (if you have MyChart) OR A paper copy in the mail If you have any lab test that is abnormal or we need to change your treatment, we will call you to review the results.   Testing/Procedures: - none ordered   Follow-Up: At [facility], you and your health needs are our priority.  As part of our continuing mission to provide you with exceptional heart care, we have created designated Provider Care Teams.  These Care Teams include your primary Cardiologist (physician) and Advanced Practice Providers (APPs -  Physician Assistants and Nurse Practitioners) who all work together to provide you with the care you need, when you need it.  We recommend signing up for the patient portal called "MyChart".  Sign up information is provided  on this After Visit Summary.  MyChart is used to connect with patients for Virtual Visits (Telemedicine).  Patients  are able to view lab/test results, encounter notes, upcoming appointments, etc.  Non-urgent messages can be sent to your provider as well.   To learn more about what you can do with MyChart, go to [email or web address].    Your next appointment:   6 week(s)  The format for your next appointment:   In Person  Provider:   Kate Sable, MD (only)}    Other Instructions  Praluent (Alirocumab) injection What is this medication? ALIROCUMAB (al i ROC ue mab) is known as a PCSK9 inhibitor. It is used to lower the level of cholesterol in the blood. It may be used alone or in combination with other cholesterol-lowering drugs. This drug may also be used to reduce the risk of heart attack, stroke, and certain types of chest pain (unstable angina) that may need hospitalization. This medicine may be used for other purposes; ask your health care provider or pharmacist if you have questions. COMMON BRAND NAME(S): Praluent What should I tell my care team before I take this medication? They need to know if you have any of these conditions: any unusual or allergic reaction to alirocumab, other medicines, foods, dyes, or preservatives pregnant or trying to get pregnant breast-feeding How should I use this medication? This medicine is for injection under the skin. You will be taught how to prepare and give this medicine. Use exactly as directed. Take your medicine at regular intervals. Do not take your medicine more often than directed. It is important that you put your used needles and syringes in a special sharps container. Do not put them in a trash can. If you do not have a sharps container, call your pharmacist or healthcare provider to get one. Talk to your pediatrician regarding the use of this medicine in children. Special care may be needed. Overdosage: If you think you have taken too much of this medicine contact a poison control center or emergency room at once. NOTE: This medicine is only  for you. Do not share this medicine with others. What if I miss a dose? If you are on an every 2 week schedule and miss a dose, take it as soon as you can. If your next dose is to be taken in less than 7 days, then do not take the missed dose. Take the next dose at your regular time. Do not take double or extra doses. If you are on a monthly schedule and miss a dose, take it as soon as you can. If the dose given is within 7 days of the missed dose, continue with your regular monthly schedule. If the missed dose is administered after 7 days of the original date, administer the dose and start a new monthly schedule based on this date. What may interact with this medication? Interactions are not expected. This list may not describe all possible interactions. Give your health care provider a list of all the medicines, herbs, non-prescription drugs, or dietary supplements you use. Also tell them if you smoke, drink alcohol, or use illegal drugs. Some items may interact with your medicine. What should I watch for while using this medication? You may need blood work done while you are taking this medicine. What side effects may I notice from receiving this medication? Side effects that you should report to your doctor or health care professional as soon as possible: allergic reactions like skin rash, itching or  hives, swelling of the face, lips, or tongue signs and symptoms of infection like fever or chills; cough; sore throat; pain or trouble passing urine signs and symptoms of liver injury like dark yellow or brown urine; general ill feeling or flu-like symptoms; light-colored stools; loss of appetite; nausea; right upper belly pain; unusually weak or tired; yellowing of the eyes or skin Side effects that usually do not require medical attention (report to your doctor or health care professional if they continue or are bothersome): diarrhea muscle cramps muscle pain pain, redness, or irritation at site  where injected This list may not describe all possible side effects. Call your doctor for medical advice about side effects. You may report side effects to FDA at 1-800-FDA-1088. Where should I keep my medication? Keep out of the reach of children. You will be instructed on how to store this medicine. Throw away any unused medicine after the expiration date on the label. NOTE: This sheet is a summary. It may not cover all possible information. If you have questions about this medicine, talk to your doctor, pharmacist, or health care provider.  2022 Elsevier/Gold Standard (2017-07-16 00:00:00)     Signed, Kate Sable, MD  05/05/2021 12:37 PM    [facility]

## 2021-05-05 NOTE — Telephone Encounter (Signed)
Fax received from [facility] requesting prior authorization of Praluent 140 mg/mL. PA initiated in [email or web address]. KEY: [id number]  RESPONSE: [facility] is reviewing your PA request. Typically an electronic response will be received within 24-72 hours.

## 2021-05-12 ENCOUNTER — Encounter: Payer: Self-pay | Admitting: Internal Medicine

## 2021-05-12 ENCOUNTER — Other Ambulatory Visit: Payer: Self-pay

## 2021-05-12 ENCOUNTER — Ambulatory Visit (INDEPENDENT_AMBULATORY_CARE_PROVIDER_SITE_OTHER): Payer: Medicare Other | Admitting: Internal Medicine

## 2021-05-12 VITALS — BP 120/76 | HR 54 | Temp 97.6°F | Ht 71.0 in | Wt 213.8 lb

## 2021-05-12 DIAGNOSIS — I10 Essential (primary) hypertension: Secondary | ICD-10-CM

## 2021-05-12 DIAGNOSIS — Z8546 Personal history of malignant neoplasm of prostate: Secondary | ICD-10-CM | POA: Diagnosis not present

## 2021-05-12 DIAGNOSIS — Z0000 Encounter for general adult medical examination without abnormal findings: Secondary | ICD-10-CM

## 2021-05-12 DIAGNOSIS — R2689 Other abnormalities of gait and mobility: Secondary | ICD-10-CM

## 2021-05-12 DIAGNOSIS — R42 Dizziness and giddiness: Secondary | ICD-10-CM

## 2021-05-12 DIAGNOSIS — E785 Hyperlipidemia, unspecified: Secondary | ICD-10-CM

## 2021-05-12 DIAGNOSIS — R7301 Impaired fasting glucose: Secondary | ICD-10-CM

## 2021-05-12 DIAGNOSIS — R5383 Other fatigue: Secondary | ICD-10-CM | POA: Diagnosis not present

## 2021-05-12 DIAGNOSIS — Z1211 Encounter for screening for malignant neoplasm of colon: Secondary | ICD-10-CM

## 2021-05-12 DIAGNOSIS — M6281 Muscle weakness (generalized): Secondary | ICD-10-CM | POA: Diagnosis not present

## 2021-05-12 DIAGNOSIS — I69311 Memory deficit following cerebral infarction: Secondary | ICD-10-CM

## 2021-05-12 NOTE — Assessment & Plan Note (Signed)
Continue annual pSA

## 2021-05-12 NOTE — Assessment & Plan Note (Signed)

## 2021-05-12 NOTE — Assessment & Plan Note (Addendum)
LDL not at goal on Zetia  And 2/weekly lipitor.  praluent prescribed but not started yet. . First dose march 1 will return for labs march 12

## 2021-05-12 NOTE — Progress Notes (Signed)
Patient ID: Bobby Murillo, male    DOB: May 08, 1940  Age: 81 y.o. [name]: [id number]  The patient is here for annual preventive  examination and management of other chronic and acute problems.   The risk factors are reflected in the social history.  The roster of all physicians providing medical care to patient - is listed in the Snapshot section of the chart.  Activities of daily living:  The patient is 100% independent in all ADLs: dressing, toileting, feeding as well as independent mobility  Home safety : The patient has smoke detectors in the home. They wear seatbelts.  There are no firearms at home. There is no violence in the home.   There is no risks for hepatitis, STDs or HIV. There is no   history of blood transfusion. They have no travel history to infectious disease endemic areas of the world.  The patient has seen their dentist in the last six month. They have seen their eye doctor in the last year. They admit to slight hearing difficulty with regard to whispered voices and some television programs.  They have deferred audiologic testing in the last year.  They do not  have excessive sun exposure. Discussed the need for sun protection: hats, long sleeves and use of sunscreen if there is significant sun exposure.   Diet: the importance of a healthy diet is discussed. They do have a healthy diet.  The benefits of regular aerobic exercise were discussed. She walks 4 times per week ,  20 minutes.   Depression screen: there are no signs or vegative symptoms of depression- irritability, change in appetite, anhedonia, sadness/tearfullness.  Cognitive assessment: the patient manages all their financial and personal affairs and is actively engaged. They could relate day,date,year and events; recalled 2/3 objects at 3 minutes; performed clock-face test normally.  The following portions of the patient's history were reviewed and updated as appropriate: allergies, current medications, past family  history, past medical history,  past surgical history, past social history  and problem list.  Visual acuity was not assessed per patient preference since she has regular follow up with her ophthalmologist. Hearing and body mass index were assessed and reviewed.   During the course of the visit the patient was educated and counseled about appropriate screening and preventive services including : fall prevention , diabetes screening, nutrition counseling, colorectal cancer screening, and recommended immunizations.    CC: The primary encounter diagnosis was Essential hypertension. Diagnoses of Hyperlipidemia LDL goal <100, History of prostate cancer, Impaired fasting glucose, Other fatigue, Colon cancer screening, Loss of balance, Proximal muscle weakness, Memory deficit after cerebral infarction, Dizziness, and Encounter for preventive health examination were also pertinent to this visit.  1) Loss of balance:  recurrent.  Started using flonase last week and is helping somewhat .  No vertigo.  Can bend over without falling .  Notices it in the middle of  the night during a void.  Not exercising like he used to .  Feels like he  can't walk a straight line   ) h.o CVA s/p CEA .  Cardiology has added praluent for LDL goal o f 70 ..  continue 2/week statin and daily zetia   for now.  Has not started praluent   3) HTN:  has reduced losartan to 25 mg  per cardiology  History Norm has a past medical history of Abnormal LFTs, Anxiety, Aortic atherosclerosis ([place]), Arthritis, Basal cell carcinoma (09/14/2010), Bilateral carotid artery stenosis, CKD (chronic kidney disease),  stage III [facility]), History of kidney stones, Hyperlipidemia, Hypertension, Long term current use of antithrombotics/antiplatelets, Prostate cancer ([place]) (03/16/2008), Sinus bradycardia by electrocardiogram, and Stroke ([place]) (01/03/2021).   He has a past surgical history that includes Prostatectomy (11/14/2008); Colonoscopy; and  Endarterectomy (Left, 02/19/2021).   His family history includes Alzheimer's disease in his mother and sister; Heart disease (age of onset: 39) in his paternal uncle; Prostate cancer in his father; Stroke in his father and paternal aunt.He reports that he has never smoked. He has never used smokeless tobacco. He reports current alcohol use of about 8.0 standard drinks per week. He reports that he does not use drugs.  Outpatient Medications Prior to Visit  Medication Sig Dispense Refill   acetaminophen (TYLENOL) 325 MG tablet Take 650 mg by mouth every 6 (six) hours as needed for moderate pain.     Alirocumab (PRALUENT) 150 MG/ML SOAJ Inject 150 mg into the skin every 14 (fourteen) days. 2 mL 6   ALPRAZolam (XANAX) 0.5 MG tablet TAKE 1 TABLET BY MOUTH AT BEDTIME AS NEEDED FOR SLEEP 30 tablet 5   aspirin EC 81 MG EC tablet Take 1 tablet (81 mg total) by mouth daily. Swallow whole. (Patient taking differently: Take 325 mg by mouth daily. Swallow whole.) 30 tablet 11   atorvastatin (LIPITOR) 20 MG tablet Take 1 tablet (20 mg total) by mouth 2 (two) times a week. 26 tablet 3   ezetimibe (ZETIA) 10 MG tablet Take 1 tablet by mouth once daily 90 tablet 0   fluticasone (FLONASE) 50 MCG/ACT nasal spray Place 2 sprays into both nostrils daily. 16 g 6   Glucosamine-Chondroitin (GLUCOSAMINE CHONDR COMPLEX PO) Take 1 tablet by mouth daily. tab by mouth daily     losartan (COZAAR) 25 MG tablet Take 1 tablet (25 mg total) by mouth daily. 90 tablet 1   polycarbophil (FIBERCON) 625 MG tablet Take 625 mg by mouth daily.     No facility-administered medications prior to visit.    Review of Systems  Patient denies headache, fevers, malaise, unintentional weight loss, skin rash, eye pain, sinus congestion and sinus pain, sore throat, dysphagia,  hemoptysis , cough, dyspnea, wheezing, chest pain, palpitations, orthopnea, edema, abdominal pain, nausea, melena, diarrhea, constipation, flank pain, dysuria, hematuria,  urinary  Frequency, nocturia, numbness, tingling, seizures,  Focal weakness, Loss of consciousness,  Tremor, insomnia, depression, anxiety, and suicidal ideation.     Objective:  BP 120/76 (BP Location: Left Arm, Patient Position: Sitting, Cuff Size: Small)    Pulse (!) 54    Temp 97.6 F (36.4 C) (Oral)    Ht 5\' 11"  (1.803 m)    Wt 213 lb 12.8 oz (97 kg)    SpO2 96%    BMI 29.82 kg/m   Physical Exam   General appearance: alert, cooperative and appears stated age Ears: normal TM's and external ear canals both ears Throat: lips, mucosa, and tongue normal; teeth and gums normal Neck: no adenopathy, no carotid bruit, supple, symmetrical, trachea midline and thyroid not enlarged, symmetric, no tenderness/mass/nodules Back: symmetric, no curvature. ROM normal. No CVA tenderness. Lungs: clear to auscultation bilaterally Heart: regular rate and rhythm, S1, S2 normal, no murmur, click, rub or gallop Abdomen: soft, non-tender; bowel sounds normal; no masses,  no organomegaly Pulses: 2+ and symmetric Skin: Skin color, texture, turgor normal. No rashes or lesions Lymph nodes: Cervical, supraclavicular, and axillary nodes normal.  Neuro: CNs 2-12 intact. DTRs 2+/4 in biceps, brachioradialis, patellars and achilles. Muscle strength 5/5 in upper  and 4+/5 in lower exremities. Fine resting tremor bilaterally both hands , with occasional past pointing during testing of cerebellar function normal. Romberg negative.  No pronator drift.   Gait normal,   Assessment & Plan:   Problem List Items Addressed This Visit     Hyperlipidemia LDL goal <100 (Chronic)    LDL not at goal on Zetia  And 2/weekly lipitor.  praluent prescribed but not started yet. . First dose march 1 will return for labs march 12       Relevant Orders   Lipid panel   Comprehensive metabolic panel   History of prostate cancer    Continue annual pSA      Relevant Orders   PSA   Essential hypertension - Primary   Relevant Orders    Urine Microalbumin w/creat. ratio   Encounter for preventive health examination    age appropriate education and counseling updated, referrals for preventative services and immunizations addressed, dietary and smoking counseling addressed, most recent labs reviewed.  I have personally reviewed and have noted:   1) the patient's medical and social history 2) The pt's use of alcohol, tobacco, and illicit drugs 3) The patient's current medications and supplements 4) Functional ability including ADL's, fall risk, home safety risk, hearing and visual impairment 5) Diet and physical activities 6) Evidence for depression or mood disorder 7) The patient's height, weight, and BMI have been recorded in the chart 8) I have ordered and reviewed a 12 lead EKG and find that there are no acute changes and patient is in sinus rhythm.     I have made referrals, and provided counseling and education based on review of the above      Loss of balance   Relevant Orders   Ambulatory referral to Physical Therapy   B12 and Folate Panel   Memory deficit after cerebral infarction    Brought up today during CPE.  Needs to return for evaluation       Proximal muscle weakness    PT referral in progress      Relevant Orders   Ambulatory referral to Physical Therapy   Other Visit Diagnoses     Impaired fasting glucose       Relevant Orders   HgB A1c   Other fatigue       Relevant Orders   CBC with Differential/Platelet   TSH   Colon cancer screening       Dizziness       Relevant Orders   B12 and Folate Panel       I am having Dhyan Noah. Vanantwerp maintain his acetaminophen, polycarbophil, Glucosamine-Chondroitin (GLUCOSAMINE CHONDR COMPLEX PO), atorvastatin, fluticasone, aspirin, ALPRAZolam, ezetimibe, Praluent, and losartan.  No orders of the defined types were placed in this encounter.   There are no discontinued medications.  Follow-up: No follow-ups on file.   Crecencio Mc, MD

## 2021-05-12 NOTE — Patient Instructions (Addendum)
Continue the Zetia and the Lipitor for now .    Return for fasting labs on March 12, 13,, or 14.     I agree with reduced dose of losartan.  Your BP is fine   You are due for colon cancer screening.  We discussed colonoscopy vs Cologuard.  Let me know what you have chosen to do via mychart. .  I have made  a physical therapy referral to work on your balance and leg weakness

## 2021-05-12 NOTE — Assessment & Plan Note (Signed)
Brought up today during CPE.  Needs to return for evaluation

## 2021-05-12 NOTE — Assessment & Plan Note (Signed)
PT referral in progress

## 2021-05-15 DIAGNOSIS — H2512 Age-related nuclear cataract, left eye: Secondary | ICD-10-CM | POA: Diagnosis not present

## 2021-05-15 DIAGNOSIS — Z961 Presence of intraocular lens: Secondary | ICD-10-CM | POA: Diagnosis not present

## 2021-05-16 DIAGNOSIS — H25041 Posterior subcapsular polar age-related cataract, right eye: Secondary | ICD-10-CM | POA: Diagnosis not present

## 2021-05-16 DIAGNOSIS — H2511 Age-related nuclear cataract, right eye: Secondary | ICD-10-CM | POA: Diagnosis not present

## 2021-05-16 DIAGNOSIS — H25011 Cortical age-related cataract, right eye: Secondary | ICD-10-CM | POA: Diagnosis not present

## 2021-05-20 DIAGNOSIS — Z1211 Encounter for screening for malignant neoplasm of colon: Secondary | ICD-10-CM | POA: Diagnosis not present

## 2021-05-27 ENCOUNTER — Other Ambulatory Visit (INDEPENDENT_AMBULATORY_CARE_PROVIDER_SITE_OTHER): Payer: Medicare Other

## 2021-05-27 ENCOUNTER — Other Ambulatory Visit: Payer: Self-pay

## 2021-05-27 DIAGNOSIS — R2689 Other abnormalities of gait and mobility: Secondary | ICD-10-CM | POA: Diagnosis not present

## 2021-05-27 DIAGNOSIS — E785 Hyperlipidemia, unspecified: Secondary | ICD-10-CM

## 2021-05-27 DIAGNOSIS — R5383 Other fatigue: Secondary | ICD-10-CM

## 2021-05-27 DIAGNOSIS — R42 Dizziness and giddiness: Secondary | ICD-10-CM

## 2021-05-27 DIAGNOSIS — Z8546 Personal history of malignant neoplasm of prostate: Secondary | ICD-10-CM | POA: Diagnosis not present

## 2021-05-27 DIAGNOSIS — R7301 Impaired fasting glucose: Secondary | ICD-10-CM

## 2021-05-27 DIAGNOSIS — I10 Essential (primary) hypertension: Secondary | ICD-10-CM | POA: Diagnosis not present

## 2021-05-27 LAB — COMPREHENSIVE METABOLIC PANEL
ALT: 30 U/L (ref 0–53)
AST: 25 U/L (ref 0–37)
Albumin: 4 g/dL (ref 3.5–5.2)
Alkaline Phosphatase: 94 U/L (ref 39–117)
BUN: 25 mg/dL — ABNORMAL HIGH (ref 6–23)
CO2: 26 mEq/L (ref 19–32)
Calcium: 9.6 mg/dL (ref 8.4–10.5)
Chloride: 107 mEq/L (ref 96–112)
Creatinine, Ser: 1.42 mg/dL (ref 0.40–1.50)
GFR: 47.18 mL/min — ABNORMAL LOW (ref >60.00)
Glucose, Bld: 100 mg/dL — ABNORMAL HIGH (ref 70–99)
Potassium: 4.2 mEq/L (ref 3.5–5.1)
Sodium: 140 mEq/L (ref 135–145)
Total Bilirubin: 1.4 mg/dL — ABNORMAL HIGH (ref 0.2–1.2)
Total Protein: 6.2 g/dL (ref 6.0–8.3)

## 2021-05-27 LAB — MICROALBUMIN / CREATININE URINE RATIO
Creatinine,U: 142.3 mg/dL
Microalb Creat Ratio: 0.5 mg/g (ref 0.0–30.0)
Microalb, Ur: <0.7 mg/dL (ref 0.0–1.9)

## 2021-05-27 LAB — CBC WITH DIFFERENTIAL/PLATELET
Basophils Absolute: 0.1 10*3/uL (ref 0.0–0.1)
Basophils Relative: 1 % (ref 0.0–3.0)
Eosinophils Absolute: 0.3 10*3/uL (ref 0.0–0.7)
Eosinophils Relative: 4.8 % (ref 0.0–5.0)
HCT: 51.5 % (ref 39.0–52.0)
Hemoglobin: 17.3 g/dL — ABNORMAL HIGH (ref 13.0–17.0)
Lymphocytes Relative: 23.7 % (ref 12.0–46.0)
Lymphs Abs: 1.3 10*3/uL (ref 0.7–4.0)
MCHC: 33.5 g/dL (ref 30.0–36.0)
MCV: 90.2 fl (ref 78.0–100.0)
Monocytes Absolute: 0.7 10*3/uL (ref 0.1–1.0)
Monocytes Relative: 12.8 % — ABNORMAL HIGH (ref 3.0–12.0)
Neutro Abs: 3.2 10*3/uL (ref 1.4–7.7)
Neutrophils Relative %: 57.7 % (ref 43.0–77.0)
Platelets: 208 10*3/uL (ref 150.0–400.0)
RBC: 5.71 Mil/uL (ref 4.22–5.81)
RDW: 13.6 % (ref 11.5–15.5)
WBC: 5.5 10*3/uL (ref 4.0–10.5)

## 2021-05-27 LAB — LIPID PANEL
Cholesterol: 102 mg/dL (ref 0–200)
HDL: 61.4 mg/dL (ref >39.00)
LDL Cholesterol: 21 mg/dL (ref 0–99)
NonHDL: 40.84
Total CHOL/HDL Ratio: 2
Triglycerides: 100 mg/dL (ref 0.0–149.0)
VLDL: 20 mg/dL (ref 0.0–40.0)

## 2021-05-27 LAB — B12 AND FOLATE PANEL
Folate: 7.1 ng/mL (ref >5.9)
Vitamin B-12: 413 pg/mL (ref 211–911)

## 2021-05-27 LAB — PSA: PSA: 0 ng/mL — ABNORMAL LOW (ref 0.10–4.00)

## 2021-05-27 LAB — TSH: TSH: 4.23 u[IU]/mL (ref 0.35–5.50)

## 2021-05-27 LAB — HEMOGLOBIN A1C: Hgb A1c MFr Bld: 6.2 % (ref 4.6–6.5)

## 2021-05-29 DIAGNOSIS — H25011 Cortical age-related cataract, right eye: Secondary | ICD-10-CM | POA: Diagnosis not present

## 2021-05-29 DIAGNOSIS — H25041 Posterior subcapsular polar age-related cataract, right eye: Secondary | ICD-10-CM | POA: Diagnosis not present

## 2021-05-29 DIAGNOSIS — H2511 Age-related nuclear cataract, right eye: Secondary | ICD-10-CM | POA: Diagnosis not present

## 2021-05-29 DIAGNOSIS — Z961 Presence of intraocular lens: Secondary | ICD-10-CM | POA: Diagnosis not present

## 2021-05-29 LAB — COLOGUARD: COLOGUARD: NEGATIVE

## 2021-05-29 NOTE — Assessment & Plan Note (Signed)
Serial surveillance shows a slight rise.  Repeat in 3 months ? ?Lab Results  ?Component Value Date  ? WBC 5.5 05/27/2021  ? HGB 17.3 (H) 05/27/2021  ? HCT 51.5 05/27/2021  ? MCV 90.2 05/27/2021  ? PLT 208.0 05/27/2021  ? ? ?

## 2021-05-29 NOTE — Progress Notes (Signed)
My Chart message sent

## 2021-05-29 NOTE — Addendum Note (Signed)
Addended by: Crecencio Mc on: 05/29/2021 05:43 PM ? ? Modules accepted: Orders ? ?

## 2021-05-30 ENCOUNTER — Ambulatory Visit: Payer: Medicare Other | Admitting: Physical Therapy

## 2021-06-03 ENCOUNTER — Telehealth (INDEPENDENT_AMBULATORY_CARE_PROVIDER_SITE_OTHER): Payer: Self-pay | Admitting: Vascular Surgery

## 2021-06-03 ENCOUNTER — Other Ambulatory Visit: Payer: Self-pay

## 2021-06-03 ENCOUNTER — Ambulatory Visit: Payer: Medicare Other | Attending: Internal Medicine

## 2021-06-03 DIAGNOSIS — R2689 Other abnormalities of gait and mobility: Secondary | ICD-10-CM | POA: Diagnosis not present

## 2021-06-03 DIAGNOSIS — M6281 Muscle weakness (generalized): Secondary | ICD-10-CM | POA: Insufficient documentation

## 2021-06-03 DIAGNOSIS — R2681 Unsteadiness on feet: Secondary | ICD-10-CM | POA: Diagnosis not present

## 2021-06-03 NOTE — Therapy (Signed)
[facility] ?[place], [place], [address] ?Phone: [phone]   Fax:  [phone] ? ?Physical Therapy Treatment ? ?Patient Details  ?Name: Bobby Murillo ?[name]: [id number] ?Date of Birth: 06-Mar-1941 ?Referring Provider (PT): Deborra Medina ? ? ?Encounter Date: 06/03/2021 ? ? PT End of Session - 06/03/21 1203   ? ? Visit Number 1   ? Number of Visits 24   ? Date for PT Re-Evaluation 08/26/21   ? Authorization Type 1/10 eval 3/21   ? PT Start Time 1100   ? PT Stop Time 1146   ? PT Time Calculation (min) 46 min   ? Equipment Utilized During Treatment Gait belt   ? Activity Tolerance Patient tolerated treatment well   ? Behavior During Therapy [facility] for tasks assessed/performed   ? ?  ?  ? ?  ? ? ?Past Medical History:  ?Diagnosis Date  ? Abnormal LFTs   ? Anxiety   ? Aortic atherosclerosis ([place])   ? Arthritis   ? Basal cell carcinoma 09/14/2010  ? left calf   ? Bilateral carotid artery stenosis   ? a.) Carotid doppler 03/50/0938: 18-29% LICA; 9-37% RICA. b.) CTA neck 01/30/2021: critical stenosis LEFT carotid bulb.  ? CKD (chronic kidney disease), stage III ([place])   ? History of kidney stones   ? Hyperlipidemia   ? Hypertension   ? Long term current use of antithrombotics/antiplatelets   ? a.) DAPT therapy (ASA + clopidogrel)  ? Prostate cancer ([place]) 03/16/2008  ? Dr. Ronny Bacon  ? Sinus bradycardia by electrocardiogram   ? Stroke ([place]) 01/03/2021  ? a.) Acute infarction affecting the cortex. ?? incidental punctate acute white matter infarction just posterior to the atrium of the left lateral ventricle. Stenosis of the right M2 branch inferior division. Severe atherosclerotic disease of both PCAs with severe stenoses.  ? ? ?Past Surgical History:  ?Procedure Laterality Date  ? COLONOSCOPY    ? ENDARTERECTOMY Left 02/19/2021  ? Procedure: ENDARTERECTOMY CAROTID;  Surgeon: Katha Cabal, MD;  Location: [facility] ORS;  Service: Vascular;  Laterality: Left;  ?  PROSTATECTOMY  11/14/2008  ? transurethral, radical, Dr. Darcus Austin  ? ? ?There were no vitals filed for this visit. ? ? Subjective Assessment - 06/03/21 1106   ? ? Subjective Patient presents to physical therapy for instability, weakness, and CVA.   ? Pertinent History Patient has a past medical history of Abnormal LFTs, Anxiety, Aortic atherosclerosis ([place]), Arthritis, Basal cell carcinoma (09/14/2010), Bilateral carotid artery stenosis, CKD (chronic kidney disease), stage III ([place]), History of kidney stones, Hyperlipidemia, Hypertension, Long term current use of antithrombotics/antiplatelets, Prostate cancer ([place]) (03/16/2008), Sinus bradycardia by electrocardiogram, and Stroke ([place]) (01/03/2021). Balance has been an issue for about a year. Reports they thought it was an inner ear issue, went to ENT and did not see any deficits of the ear. Has some times of relapse of balance. Has not had a fall but has had many near falls.   ? How long can you sit comfortably? n/a   ? How long can you stand comfortably? 10 minutes   ? Patient Stated Goals to improve balance, plays golf and tennis   ? Currently in Pain? No/denies   ? ?  ?  ? ?  ? ? ? ? ? [facility] PT Assessment - 06/03/21 0001   ? ?  ? Assessment  ? Medical Diagnosis balance/CVA/weakness   ? Referring Provider (PT) Deborra Medina   ? Onset Date/Surgical  Date --   about a year ago  ? Hand Dominance Right   ? Prior Therapy no   ?  ? Precautions  ? Precautions Fall   ?  ? Restrictions  ? Weight Bearing Restrictions No   ?  ? Balance Screen  ? Has the patient fallen in the past 6 months No   multiple near falls  ? Has the patient had a decrease in activity level because of a fear of falling?  Yes   ? Is the patient reluctant to leave their home because of a fear of falling?  No   ?  ? Home Environment  ? Living Environment Private residence   ? Living Arrangements Spouse/significant other   ? Type of Home House   ? Home Access Level entry   ? Home Layout Two level;Able to live  on main level with bedroom/bathroom   ?  ? Prior Function  ? Level of Independence Independent   ? Vocation Retired   Engineer, technical sales  ? Leisure golf, tennis   ?  ? Cognition  ? Overall Cognitive Status --   memory issue with names since stroke, occasional word choices.  ?  ? Observation/Other Assessments  ? Focus on Therapeutic Outcomes (FOTO)  50   ?  ? Functional Gait  Assessment  ? Gait assessed  Yes   ? Gait Level Surface Walks 20 ft in less than 7 sec but greater than 5.5 sec, uses assistive device, slower speed, mild gait deviations, or deviates 6-10 in outside of the 12 in walkway width.   ? Change in Gait Speed Able to change speed, demonstrates mild gait deviations, deviates 6-10 in outside of the 12 in walkway width, or no gait deviations, unable to achieve a major change in velocity, or uses a change in velocity, or uses an assistive device.   ? Gait with Horizontal Head Turns Performs head turns with moderate changes in gait velocity, slows down, deviates 10-15 in outside 12 in walkway width but recovers, can continue to walk.   ? Gait with Vertical Head Turns Performs task with moderate change in gait velocity, slows down, deviates 10-15 in outside 12 in walkway width but recovers, can continue to walk.   ? Gait and Pivot Turn Turns slowly, requires verbal cueing, or requires several small steps to catch balance following turn and stop   ? Step Over Obstacle Is able to step over one shoe box (4.5 in total height) but must slow down and adjust steps to clear box safely. May require verbal cueing.   ? Gait with Narrow Base of Support Ambulates less than 4 steps heel to toe or cannot perform without assistance.   ? Gait with Eyes Closed Walks 20 ft, slow speed, abnormal gait pattern, evidence for imbalance, deviates 10-15 in outside 12 in walkway width. Requires more than 9 sec to ambulate 20 ft.   ? Ambulating Backwards Walks 20 ft, slow speed, abnormal gait pattern, evidence for imbalance, deviates 10-15 in outside  12 in walkway width.   ? Steps Alternating feet, must use rail.   ? Total Score 12   ? ?  ?  ? ?  ? ? ? ? PAIN: ?Occasional back pain.  ? ?POSTURE: ?WFL ? ? ?STRENGTH:  Graded on a 0-5 scale ?Muscle Group Left Right  ?Hip Flex 4/5 4/5  ?Hip Abd 4/5 4/5  ?Hip Add 4-/5 4-/5  ?Hip Ext 4-/5 4-/5  ?Hip IR/ER 4/5 4/5  ?Knee Flex 4-/5  4-/5  ?Knee Ext 4-/5 4-/5  ?Ankle DF 3+/5 3+/5  ?Ankle PF 3+/5 3+/5  ? ?SENSATION:  ?BUE :  ?BLE :  ? ?NEUROLOGICAL SCREEN: (2+ unless otherwise noted.) N=normal  Ab=abnormal ?  ?Level Dermatome R L  ?L2 Medial thigh/groin N N  ?L3 Lower thigh/med.knee N N  ?L4 Medial leg/lat thigh N N  ?L5 Lat. leg & dorsal foot N N  ?S1 post/lat foot/thigh/leg N N  ?S2 Post./med. thigh & leg N N  ?  ?SOMATOSENSORY:  ?Any N & T in extremities or weakness: reports : ?  ?      Sensation           Intact      Diminished         Absent  ?Light touch LEs     ?                         ?COORDINATION: ?Finger to Nose: WFL; slight past pointing bilaterally  ?        Heel Shin Slide Test: [facility] ? ? ?SPECIAL TESTS: ?Cranial Nerves/Vision: able to track pen without difficulty  ? ?FUNCTIONAL MOBILITY: ? ?STS; performs without UE support ? ?BALANCE: ?Static Sitting Balance  ?Normal Able to maintain balance against maximal resistance   ?Good Able to maintain balance against moderate resistance x  ?Good-/Fair+ Accepts minimal resistance   ?Fair Able to sit unsupported without balance loss and without UE support   ?Poor+ Able to maintain with Minimal assistance from individual or chair   ?Poor Unable to maintain balance-requires mod/max support from individual or chair   ? ?Static Standing Balance  ?Normal Able to maintain standing balance against maximal resistance   ?Good Able to maintain standing balance against moderate resistance   ?Good-/Fair+ Able to maintain standing balance against minimal resistance   ?Fair Able to stand unsupported without UE support and without LOB for 1-2 min x  ?Fair- Requires Min A and UE  support to maintain standing without loss of balance   ?Poor+ Requires mod A and UE support to maintain standing without loss of balance   ?Poor Requires max A and UE support to maintain standing balance without loss

## 2021-06-03 NOTE — Telephone Encounter (Signed)
Bring patient in carotid duplex and follow up with Schnier or Arna Medici per Eulogio Ditch NP ?

## 2021-06-03 NOTE — Telephone Encounter (Signed)
Mr. Silbernagel called and wants to make an appointment with Dr. Delana Meyer to make sure everything is going ok.  Please advise if any studies needs to be done. ?

## 2021-06-03 NOTE — Patient Instructions (Signed)
Patient is a very pleasant 81 year old male who presents to physical therapy for unsteadiness, weakness, and CVA. Patient is very active at baseline with prior level of function including golf and tennis. Patient had a stroke in October 2022 and does have minor memory issues including names and some word choice. Patient's balance initially began declining prior to stroke. He ambulates without an AD but does have weaving L and R occasionally. Tandem stance and eyes closed significantly increase his instability. Patient educated on HEP and demonstrated understanding. Patient will benefit from skilled physical therapy to increase stability, strength, and mobility to return to PLOF. ? ? ? ? ? ? ?

## 2021-06-12 ENCOUNTER — Other Ambulatory Visit: Payer: Medicare Other

## 2021-06-16 ENCOUNTER — Encounter: Payer: Self-pay | Admitting: Internal Medicine

## 2021-06-16 ENCOUNTER — Encounter: Payer: Self-pay | Admitting: Cardiology

## 2021-06-16 ENCOUNTER — Ambulatory Visit (INDEPENDENT_AMBULATORY_CARE_PROVIDER_SITE_OTHER): Payer: Medicare Other | Admitting: Internal Medicine

## 2021-06-16 ENCOUNTER — Ambulatory Visit: Payer: Medicare Other | Admitting: Cardiology

## 2021-06-16 VITALS — BP 122/72 | HR 76 | Ht 71.5 in | Wt 213.0 lb

## 2021-06-16 DIAGNOSIS — I6522 Occlusion and stenosis of left carotid artery: Secondary | ICD-10-CM | POA: Diagnosis not present

## 2021-06-16 DIAGNOSIS — I10 Essential (primary) hypertension: Secondary | ICD-10-CM | POA: Diagnosis not present

## 2021-06-16 DIAGNOSIS — I700 Atherosclerosis of aorta: Secondary | ICD-10-CM

## 2021-06-16 DIAGNOSIS — R419 Unspecified symptoms and signs involving cognitive functions and awareness: Secondary | ICD-10-CM

## 2021-06-16 DIAGNOSIS — R42 Dizziness and giddiness: Secondary | ICD-10-CM | POA: Diagnosis not present

## 2021-06-16 DIAGNOSIS — R2689 Other abnormalities of gait and mobility: Secondary | ICD-10-CM

## 2021-06-16 DIAGNOSIS — M6281 Muscle weakness (generalized): Secondary | ICD-10-CM | POA: Diagnosis not present

## 2021-06-16 DIAGNOSIS — Z8673 Personal history of transient ischemic attack (TIA), and cerebral infarction without residual deficits: Secondary | ICD-10-CM

## 2021-06-16 DIAGNOSIS — E7800 Pure hypercholesterolemia, unspecified: Secondary | ICD-10-CM | POA: Diagnosis not present

## 2021-06-16 DIAGNOSIS — N1831 Chronic kidney disease, stage 3a: Secondary | ICD-10-CM | POA: Diagnosis not present

## 2021-06-16 NOTE — Patient Instructions (Signed)
I recommend increasing the losartan back to 50 mg daily and following your BP at home.  Goal is 130/80 or sllightly less ? ? ?PT should improve your balance with time and repeated exercise ? ? ?You do not have significant cognitive issues,  certainly nothing to suggest Alzheimers ?

## 2021-06-16 NOTE — Assessment & Plan Note (Signed)
Renal function is at  baseline with avoidance of NSAIDs.  Hhe is on an ARB for control of hypertension,, and statin for control of hyperlipidemia.  ? ?Lab Results  ?Component Value Date  ? CREATININE 1.42 05/27/2021  ? ? ?

## 2021-06-16 NOTE — Assessment & Plan Note (Signed)
Reviewed findings of prior CT scan today..  Patient is tolerating high potency statin therapy twice weekly, augmenting wih zetia and praluent.  LDL is 21  ? ?Lab Results  ?Component Value Date  ? CHOL 102 05/27/2021  ? HDL 61.40 05/27/2021  ? [place] 21 05/27/2021  ? LDLDIRECT 184.0 12/30/2015  ? TRIG 100.0 05/27/2021  ? CHOLHDL 2 05/27/2021  ? ? ?

## 2021-06-16 NOTE — Progress Notes (Signed)
?Cardiology Office Note:   ? ?Date:  06/16/2021  ? ?ID:  Bobby Murillo, DOB 1940/11/21, [name] [id number] ? ?PCP:  Crecencio Mc, MD ?  ?[facility] Providers ?Cardiologist:  None    ? ?Referring MD: Crecencio Mc, MD  ? ?Chief Complaint  ?Patient presents with  ? Other  ?  6 week follow up -- Meds reviewed verbally with patient.   ? ? ?History of Present Illness:   ? ?Bobby Murillo is a 81 y.o. male with a hx of hyperlipidemia, hypertension, CKD 3, CVA , carotid artery stenosis s/p L. CEA 02/2021 who presents for follow-up. ? ?Previously seen due to hyperlipidemia, gait issues.  Losartan was increased to 25 mg daily, Praluent was started due to statin intolerance.  Was started on physical therapy by PCP.  Had a repeat lipid panel obtained.  He still has gait issues.  Has an appointment with vascular surgery in 2 months. ?   ?Prior notes ?Echocardiogram 01/04/2021 showed normal ejection fraction EF 55 to 60%, no gross structural abnormalities. ?Lexiscan Myoview 01/2021 no evidence for ischemia, minimal coronary calcifications ?Cardiac monitor 03/2018 20, frequent PACs, ? ? ?Past Medical History:  ?Diagnosis Date  ? Abnormal LFTs   ? Acute cerebrovascular accident (CVA) due to thrombosis of left middle cerebral artery ([place]) 01/05/2021  ? Anxiety   ? Aortic atherosclerosis ([place])   ? Arthritis   ? Basal cell carcinoma 09/14/2010  ? left calf   ? Bilateral carotid artery stenosis   ? a.) Carotid doppler 28/36/6294: 76-54% LICA; 6-50% RICA. b.) CTA neck 01/30/2021: critical stenosis LEFT carotid bulb.  ? Carotid stenosis, symptomatic, with infarction (Bobby Murillo) 01/20/2021  ? CKD (chronic kidney disease), stage III ([place])   ? History of kidney stones   ? Hyperlipidemia   ? Hypertension   ? Long term current use of antithrombotics/antiplatelets   ? a.) DAPT therapy (ASA + clopidogrel)  ? Prostate cancer ([place]) 03/16/2008  ? Dr. Ronny Bacon  ? Sinus bradycardia by electrocardiogram   ? Stroke ([place]) 01/03/2021  ? a.) Acute infarction  affecting the cortex. ?? incidental punctate acute white matter infarction just posterior to the atrium of the left lateral ventricle. Stenosis of the right M2 branch inferior division. Severe atherosclerotic disease of both PCAs with severe stenoses.  ? ? ?Past Surgical History:  ?Procedure Laterality Date  ? CATARACT EXTRACTION Bilateral   ? 05/2021  ? COLONOSCOPY    ? ENDARTERECTOMY Left 02/19/2021  ? Procedure: ENDARTERECTOMY CAROTID;  Surgeon: Katha Cabal, MD;  Location: [facility] ORS;  Service: Vascular;  Laterality: Left;  ? PROSTATECTOMY  11/14/2008  ? transurethral, radical, Dr. Darcus Austin  ? ? ?Current Medications: ?Current Meds  ?Medication Sig  ? acetaminophen (TYLENOL) 325 MG tablet Take 650 mg by mouth every 6 (six) hours as needed for moderate pain.  ? Alirocumab (PRALUENT) 150 MG/ML SOAJ Inject 150 mg into the skin every 14 (fourteen) days.  ? ALPRAZolam (XANAX) 0.5 MG tablet TAKE 1 TABLET BY MOUTH AT BEDTIME AS NEEDED FOR SLEEP  ? aspirin 325 MG tablet Take 325 mg by mouth daily.  ? ezetimibe (ZETIA) 10 MG tablet Take 1 tablet by mouth once daily  ? fluticasone (FLONASE) 50 MCG/ACT nasal spray Place 2 sprays into both nostrils daily.  ? Glucosamine-Chondroitin (GLUCOSAMINE CHONDR COMPLEX PO) Take 1 tablet by mouth daily. tab by mouth daily  ? losartan (COZAAR) 25 MG tablet Take 1 tablet (25 mg total) by mouth daily.  ? polycarbophil (FIBERCON) 625  MG tablet Take 625 mg by mouth daily.  ? [DISCONTINUED] atorvastatin (LIPITOR) 20 MG tablet Take 1 tablet (20 mg total) by mouth 2 (two) times a week.  ?  ? ?Allergies:   Alfuzosin and Pravastatin sodium  ? ?Social History  ? ?Socioeconomic History  ? Marital status: Married  ?  Spouse name: [name]  ? Number of children: Not on file  ? Years of education: Not on file  ? Highest education level: Not on file  ?Occupational History  ? Not on file  ?Tobacco Use  ? Smoking status: Never  ? Smokeless tobacco: Never  ?Vaping Use  ? Vaping Use: Never used  ?Substance  and Sexual Activity  ? Alcohol use: Yes  ?  Alcohol/week: 8.0 standard drinks  ?  Types: 4 Glasses of wine, 4 Shots of liquor per week  ?  Comment: occasional  ? Drug use: No  ? Sexual activity: Yes  ?Other Topics Concern  ? Not on file  ?Social History Narrative  ? Patient lives in [place] with his wife.  Non-smoker.  Drinks over the weekend.  Retired from [employer].  ?   ? Right handed  ? ?Social Determinants of Health  ? ?Financial Resource Strain: Low Risk   ? Difficulty of Paying Living Expenses: Not hard at all  ?Food Insecurity: No Food Insecurity  ? Worried About Charity fundraiser in the Last Year: Never true  ? Ran Out of Food in the Last Year: Never true  ?Transportation Needs: No Transportation Needs  ? Lack of Transportation (Medical): No  ? Lack of Transportation (Non-Medical): No  ?Physical Activity: Insufficiently Active  ? Days of Exercise per Week: 1 day  ? Minutes of Exercise per Session: 90 min  ?Stress: No Stress Concern Present  ? Feeling of Stress : Not at all  ?Social Connections: Unknown  ? Frequency of Communication with Friends and Family: More than three times a week  ? Frequency of Social Gatherings with Friends and Family: Once a week  ? Attends Religious Services: Not on file  ? Active Member of Clubs or Organizations: Yes  ? Attends Archivist Meetings: Not on file  ? Marital Status: Married  ?  ? ?Family History: ?The patient's family history includes Alzheimer's disease in his mother and sister; Heart disease (age of onset: 75) in his paternal uncle; Prostate cancer in his father; Stroke in his father and paternal aunt. ? ?ROS:   ?Please see the history of present illness.    ? All other systems reviewed and are negative. ? ?EKGs/Labs/Other Studies Reviewed:   ? ?The following studies were reviewed today: ? ? ?EKG:  EKG is  ordered today.  The ekg ordered today demonstrates sinus rhythm, PACs, otherwise normal ECG. ? ?Recent Labs: ?03/04/2021: Magnesium 2.3 ?05/27/2021: ALT  30; BUN 25; Creatinine, Ser 1.42; Hemoglobin 17.3; Platelets 208.0; Potassium 4.2; Sodium 140; TSH 4.23  ?Recent Lipid Panel ?   ?Component Value Date/Time  ? CHOL 102 05/27/2021 0851  ? CHOL 280 (H) 10/23/2011 1142  ? TRIG 100.0 05/27/2021 0851  ? HDL 61.40 05/27/2021 0851  ? HDL 63 10/23/2011 1142  ? CHOLHDL 2 05/27/2021 0851  ? VLDL 20.0 05/27/2021 0851  ? [place] 21 05/27/2021 0851  ? LDLCALC 192 (H) 10/23/2011 1142  ? LDLDIRECT 184.0 12/30/2015 0929  ? ? ? ?Risk Assessment/Calculations:   ? ? ?    ? ?Physical Exam:   ? ?VS:  BP 122/72 (BP Location: Left Arm,  Patient Position: Sitting, Cuff Size: Normal)   Pulse 76   Ht 5' 11.5" (1.816 m)   Wt 213 lb (96.6 kg)   SpO2 96%   BMI 29.29 kg/m?    ? ?Wt Readings from Last 3 Encounters:  ?06/16/21 213 lb (96.6 kg)  ?06/16/21 211 lb 6.4 oz (95.9 kg)  ?05/12/21 213 lb 12.8 oz (97 kg)  ?  ? ?GEN:  Well nourished, well developed in no acute distress ?HEENT: Normal ?NECK: Left carotid bruit noted ?CARDIAC: RRR, no murmurs, rubs, gallops ?RESPIRATORY:  Clear to auscultation without rales, wheezing or rhonchi  ?ABDOMEN: Soft, non-tender, non-distended ?MUSCULOSKELETAL:  No edema; No deformity  ?SKIN: Warm and dry ?NEUROLOGIC:  Alert and oriented x 3 ?PSYCHIATRIC:  Normal affect  ? ?ASSESSMENT:   ? ?1. Primary hypertension   ?2. Pure hypercholesterolemia   ?3. Balance problem   ? ?PLAN:   ? ?In order of problems listed above: ? ?Hypertension, BP control, continue losartan to 25 mg daily ?Hyperlipidemia, statin allergies, cholesterol now controlled.  Continue Praluent.   ?Balance issues, wobbly gait, history of CVA.  Cardiac monitor with no significant arrhythmias.  Continue physical therapy. ? ?Follow-up i yearly. ? ?   ?Medication Adjustments/Labs and Tests Ordered: ?Current medicines are reviewed at length with the patient today.  Concerns regarding medicines are outlined above.  ?Orders Placed This Encounter  ?Procedures  ? EKG 12-Lead  ? ?No orders of the defined  types were placed in this encounter. ? ? ? ?Patient Instructions  ?Medication Instructions:  ? ?Your physician has recommended you make the following change in your medication:  ? ?  STOP taking Atorvastatin Lipitor.

## 2021-06-16 NOTE — Assessment & Plan Note (Signed)
Continue PT

## 2021-06-16 NOTE — Assessment & Plan Note (Addendum)
S/p left CEA on Dec 8.  Recent CT Angiogram done during ER evaluation for syncope was reviewed by Dr Delana Meyer and notes expected postoperative changes .  Continue asa 325 mg , statin 2 times weekly,  zetia and praluent  ?

## 2021-06-16 NOTE — Progress Notes (Signed)
? ?Subjective:  ?Patient ID: Bobby Murillo, male    DOB: [birth year]  Age: 81 y.o. [name]: [id number] ? ?CC: Diagnoses of Abdominal aortic atherosclerosis ([place]), Vertigo, History of CVA (cerebrovascular accident) without residual deficits, Stenosis of left internal carotid artery, Proximal muscle weakness, Stage 3a chronic kidney disease ([place]), Loss of balance, and Cognitive complaints with normal neuropsychological exam were pertinent to this visit. ? ? ?This visit occurred during the SARS-CoV-2 public health emergency.  Safety protocols were in place, including screening questions prior to the visit, additional usage of staff PPE, and extensive cleaning of exam room while observing appropriate contact time as indicated for disinfecting solutions.   ? ?HPI ?Bobby Murillo presents for evaluation of memory issues raised by patient at CPE last month  ?Chief Complaint  ?Patient presents with  ? Follow-up  ?  F/u to discuss memory issues  ? ?81 yr old male with history of TIA presenting with garbled speech in in October 2022,  1)  memory issues:  since his stroke less than a year ago.  coming up with words and names during conversations.  No rouble balancing checkbook,  but has been using a new computer and [employer] program  and the government  has rejected his filings for unclear reasons., which has been disrupting his sleep due to worrying/ruminating about the tax  .  Not repeating the same questions.  ? ?2) Abdominal aortic atherosclerosis:  taking statin .  CT reviewed   ? ?3) balance issues have not improved with lower dose of losartan at 25 mg daily   home readings are all over  130/80 .  He has had 2 PT sessions,  no falls  ? ?Outpatient Medications Prior to Visit  ?Medication Sig Dispense Refill  ? acetaminophen (TYLENOL) 325 MG tablet Take 650 mg by mouth every 6 (six) hours as needed for moderate pain.    ? Alirocumab (PRALUENT) 150 MG/ML SOAJ Inject 150 mg into the skin every 14 (fourteen) days. 2 mL 6  ?  ALPRAZolam (XANAX) 0.5 MG tablet TAKE 1 TABLET BY MOUTH AT BEDTIME AS NEEDED FOR SLEEP 30 tablet 5  ? aspirin 325 MG tablet Take 325 mg by mouth daily.    ? atorvastatin (LIPITOR) 20 MG tablet Take 1 tablet (20 mg total) by mouth 2 (two) times a week. 26 tablet 3  ? ezetimibe (ZETIA) 10 MG tablet Take 1 tablet by mouth once daily 90 tablet 0  ? fluticasone (FLONASE) 50 MCG/ACT nasal spray Place 2 sprays into both nostrils daily. 16 g 6  ? Glucosamine-Chondroitin (GLUCOSAMINE CHONDR COMPLEX PO) Take 1 tablet by mouth daily. tab by mouth daily    ? losartan (COZAAR) 25 MG tablet Take 1 tablet (25 mg total) by mouth daily. 90 tablet 1  ? polycarbophil (FIBERCON) 625 MG tablet Take 625 mg by mouth daily.    ? ALPRAZolam (XANAX) 0.25 MG tablet SMARTSIG:0.5 Tablet(s) By Mouth Every Evening    ? aspirin EC 81 MG EC tablet Take 1 tablet (81 mg total) by mouth daily. Swallow whole. (Patient not taking: Reported on 06/16/2021) 30 tablet 11  ? ?No facility-administered medications prior to visit.  ? ? ?Review of Systems; ? ?Patient denies headache, fevers, malaise, unintentional weight loss, skin rash, eye pain, sinus congestion and sinus pain, sore throat, dysphagia,  hemoptysis , cough, dyspnea, wheezing, chest pain, palpitations, orthopnea, edema, abdominal pain, nausea, melena, diarrhea, constipation, flank pain, dysuria, hematuria, urinary  Frequency, nocturia, numbness, tingling, seizures,  Focal weakness, Loss of consciousness,  Tremor, insomnia, depression, anxiety, and suicidal ideation.   ? ? ? ?Objective:  ?BP (!) 146/86 (BP Location: Left Arm, Patient Position: Sitting, Cuff Size: Normal)   Pulse (!) 48   Temp (!) 97.2 ?F (36.2 ?C) (Oral)   Ht 5' 11.5" (1.816 m)   Wt 211 lb 6.4 oz (95.9 kg)   SpO2 94%   BMI 29.07 kg/m?  ? ?BP Readings from Last 3 Encounters:  ?06/16/21 (!) 146/86  ?05/12/21 120/76  ?05/05/21 120/74  ? ? ?Wt Readings from Last 3 Encounters:  ?06/16/21 211 lb 6.4 oz (95.9 kg)  ?05/12/21 213 lb  12.8 oz (97 kg)  ?05/05/21 210 lb (95.3 kg)  ? ? ?General appearance: alert, cooperative and appears stated age ?Ears: normal TM's and external ear canals both ears ?Throat: lips, mucosa, and tongue normal; teeth and gums normal ?Neck: no adenopathy, no carotid bruit, supple, symmetrical, trachea midline and thyroid not enlarged, symmetric, no tenderness/mass/nodules ?Back: symmetric, no curvature. ROM normal. No CVA tenderness. ?Lungs: clear to auscultation bilaterally ?Heart: regular rate and rhythm, S1, S2 normal, no murmur, click, rub or gallop ?Abdomen: soft, non-tender; bowel sounds normal; no masses,  no organomegaly ?Pulses: 2+ and symmetric ?Skin: Skin color, texture, turgor normal. No rashes or lesions ?Lymph nodes: Cervical, supraclavicular, and axillary nodes normal. ?Cognitive Testing ?MMSE - Mini Mental State Exam     ?Orientation to time  5   ?Orientation to Place  5   ?Registration  3   ?Attention/ Calculation  5   ?Recall  2   ?Language- name 2 objects  2   ?Language- repeat  1   ?Language- follow 3 step command  3   ?Language- read & follow direction  1   ?Write a sentence  1   ?Copy design  1   ?Total score  29   ? ?    ?  ?  ?  ? ?Lab Results  ?Component Value Date  ? HGBA1C 6.2 05/27/2021  ? HGBA1C 6.0 (H) 01/03/2021  ? HGBA1C 5.8 06/09/2019  ? ? ?Lab Results  ?Component Value Date  ? CREATININE 1.42 05/27/2021  ? CREATININE 1.28 03/04/2021  ? CREATININE 1.38 (H) 02/22/2021  ? ? ?Lab Results  ?Component Value Date  ? WBC 5.5 05/27/2021  ? HGB 17.3 (H) 05/27/2021  ? HCT 51.5 05/27/2021  ? PLT 208.0 05/27/2021  ? GLUCOSE 100 (H) 05/27/2021  ? CHOL 102 05/27/2021  ? TRIG 100.0 05/27/2021  ? HDL 61.40 05/27/2021  ? LDLDIRECT 184.0 12/30/2015  ? [place] 21 05/27/2021  ? ALT 30 05/27/2021  ? AST 25 05/27/2021  ? NA 140 05/27/2021  ? K 4.2 05/27/2021  ? CL 107 05/27/2021  ? CREATININE 1.42 05/27/2021  ? BUN 25 (H) 05/27/2021  ? CO2 26 05/27/2021  ? TSH 4.23 05/27/2021  ? PSA 0.00 (L) 05/27/2021  ? INR  1.0 02/22/2021  ? HGBA1C 6.2 05/27/2021  ? MICROALBUR <0.7 05/27/2021  ? ? ?CT Angio Neck W and/or Wo Contrast ? ?Result Date: 02/22/2021 ?CLINICAL DATA:  Initial evaluation for pulsatile neck mass, recent CEA. EXAM: CT ANGIOGRAPHY NECK TECHNIQUE: Multidetector CT imaging of the neck was performed using the standard protocol during bolus administration of intravenous contrast. Multiplanar CT image reconstructions and MIPs were obtained to evaluate the vascular anatomy. Carotid stenosis measurements (when applicable) are obtained utilizing NASCET criteria, using the distal internal carotid diameter as the denominator. CONTRAST:  64m OMNIPAQUE IOHEXOL 350 MG/ML SOLN COMPARISON:  Prior study from 01/31/2011. FINDINGS: Aortic arch: Visualized aortic arch normal in caliber with normal 3 vessel morphology. No stenosis or other abnormality about the origin of the great vessels. Right carotid system: Right common carotid artery patent from its origin to the bifurcation without stenosis. Minimal plaque about the right carotid bulb without stenosis. Right ICA patent distally without stenosis or dissection. Left carotid system: Left CCA widely patent proximally. Postoperative changes from recent carotid endarterectomy seen about the left carotid bifurcation. Diffuse soft tissue density seen within the left carotid sheath, surrounding the distal left common and proximal left internal carotid arteries, likely postoperative. Few scattered foci of postoperative emphysema noted as well. No active contrast extravasation or pseudoaneurysm formation. No raised dissection flap or other complication. Residual stenosis of up to approximately 45% seen at the origin of the left ICA (series 8, image 120). Left ICA otherwise patent distally to the skull base without stenosis or dissection. Left external carotid artery and its branches remain patent and well perfused. Vertebral arteries: Both vertebral arteries arise from the subclavian  arteries. Vertebral arteries widely patent without stenosis or dissection. Skeleton: No visible acute osseous finding. No discrete or worrisome osseous lesions. Multilevel cervical spondylosis without high-grade spinal

## 2021-06-16 NOTE — Assessment & Plan Note (Addendum)
MMSE was done today and he missed 1 pt for  delayed recall of words. Clock drawing is normal and his one minute naming of animals was 16,  For words starting with "F"  17.   Screening labs normal as well.  No further workup at this time/  ?

## 2021-06-16 NOTE — Assessment & Plan Note (Signed)
S/p CEA of Left ICA secondary to 7o to 99% stenosis,  With 45% stenosis noted on follow up CTA. Currently taking ASA 325 mg daily,  Praluent biweekly,  Zetia 10 mg daily and atorvastatin 20 mg twice weekly (due to statin intolerance).  Continue PT for balance issues ? ? ?

## 2021-06-16 NOTE — Patient Instructions (Signed)
Medication Instructions:  ? ?Your physician has recommended you make the following change in your medication:  ? ?  STOP taking Atorvastatin Lipitor. ? ? ?*If you need a refill on your cardiac medications before your next appointment, please call your pharmacy* ? ? ?Lab Work: ? ?None ordered ? ?If you have labs (blood work) drawn today and your tests are completely normal, you will receive your results only by: ?MyChart Message (if you have MyChart) OR ?A paper copy in the mail ?If you have any lab test that is abnormal or we need to change your treatment, we will call you to review the results. ? ? ?Testing/Procedures: ? ?None ordered ? ? ?Follow-Up: ?At [facility], you and your health needs are our priority.  As part of our continuing mission to provide you with exceptional heart care, we have created designated Provider Care Teams.  These Care Teams include your primary Cardiologist (physician) and Advanced Practice Providers (APPs -  Physician Assistants and Nurse Practitioners) who all work together to provide you with the care you need, when you need it. ? ?We recommend signing up for the patient portal called "MyChart".  Sign up information is provided on this After Visit Summary.  MyChart is used to connect with patients for Virtual Visits (Telemedicine).  Patients are able to view lab/test results, encounter notes, upcoming appointments, etc.  Non-urgent messages can be sent to your provider as well.   ?To learn more about what you can do with MyChart, go to [email or web address].   ? ?Your next appointment:   ?1 year(s) ? ?The format for your next appointment:   ?In Person ? ?Provider:   ?You may see Kate Sable, MD or one of the following Advanced Practice Providers on your designated Care Team:   ?Murray Hodgkins, NP ?Christell Faith, PA-C ?Cadence Kathlen Mody, PA-C  ? ? ?Other Instructions ? ? ?

## 2021-06-16 NOTE — Assessment & Plan Note (Signed)
Resolved at this time.  °

## 2021-06-16 NOTE — Assessment & Plan Note (Signed)
No change with reduction in losartan dose and home readings are >130/80.  Advised to resume 50 mg daily losartan and continue PT ?

## 2021-06-18 ENCOUNTER — Ambulatory Visit: Payer: Medicare Other | Attending: Internal Medicine | Admitting: Physical Therapy

## 2021-06-18 ENCOUNTER — Encounter: Payer: Self-pay | Admitting: Physical Therapy

## 2021-06-18 DIAGNOSIS — R2681 Unsteadiness on feet: Secondary | ICD-10-CM | POA: Insufficient documentation

## 2021-06-18 DIAGNOSIS — R2689 Other abnormalities of gait and mobility: Secondary | ICD-10-CM | POA: Insufficient documentation

## 2021-06-18 DIAGNOSIS — M6281 Muscle weakness (generalized): Secondary | ICD-10-CM | POA: Diagnosis not present

## 2021-06-18 NOTE — Therapy (Signed)
[facility] ?[place], [place], [address] ?Phone: [phone]   Fax:  [phone] ? ?Physical Therapy Treatment ? ?Patient Details  ?Name: Bobby Murillo ?[name]: [id number] ?Date of Birth: 11/12/1940 ?Referring Provider (PT): Deborra Medina ? ? ?Encounter Date: 06/18/2021 ? ? PT End of Session - 06/18/21 0854   ? ? Visit Number 2   ? Number of Visits 24   ? Date for PT Re-Evaluation 08/26/21   ? Authorization Type 1/10 eval 3/21   ? PT Start Time 0845   ? PT Stop Time [phone]   ? PT Time Calculation (min) 43 min   ? Equipment Utilized During Treatment Gait belt   ? Activity Tolerance Patient tolerated treatment well   ? Behavior During Therapy [facility] for tasks assessed/performed   ? ?  ?  ? ?  ? ? ?Past Medical History:  ?Diagnosis Date  ? Abnormal LFTs   ? Acute cerebrovascular accident (CVA) due to thrombosis of left middle cerebral artery ([place]) 01/05/2021  ? Anxiety   ? Aortic atherosclerosis ([place])   ? Arthritis   ? Basal cell carcinoma 09/14/2010  ? left calf   ? Bilateral carotid artery stenosis   ? a.) Carotid doppler 18/84/1660: 63-01% LICA; 6-01% RICA. b.) CTA neck 01/30/2021: critical stenosis LEFT carotid bulb.  ? Carotid stenosis, symptomatic, with infarction ([place]) 01/20/2021  ? CKD (chronic kidney disease), stage III ([place])   ? History of kidney stones   ? Hyperlipidemia   ? Hypertension   ? Long term current use of antithrombotics/antiplatelets   ? a.) DAPT therapy (ASA + clopidogrel)  ? Prostate cancer ([place]) 03/16/2008  ? Dr. Ronny Bacon  ? Sinus bradycardia by electrocardiogram   ? Stroke ([place]) 01/03/2021  ? a.) Acute infarction affecting the cortex. ?? incidental punctate acute white matter infarction just posterior to the atrium of the left lateral ventricle. Stenosis of the right M2 branch inferior division. Severe atherosclerotic disease of both PCAs with severe stenoses.  ? ? ?Past Surgical History:  ?Procedure Laterality Date  ? CATARACT EXTRACTION  Bilateral   ? 05/2021  ? COLONOSCOPY    ? ENDARTERECTOMY Left 02/19/2021  ? Procedure: ENDARTERECTOMY CAROTID;  Surgeon: Katha Cabal, MD;  Location: [facility] ORS;  Service: Vascular;  Laterality: Left;  ? PROSTATECTOMY  11/14/2008  ? transurethral, radical, Dr. Darcus Austin  ? ? ?There were no vitals filed for this visit. ? ? Subjective Assessment - 06/18/21 0847   ? ? Subjective Pt reports no falls or LOB since last visit. Pt reports he has been completing HEP.   ? Pertinent History Patient has a past medical history of Abnormal LFTs, Anxiety, Aortic atherosclerosis ([place]), Arthritis, Basal cell carcinoma (09/14/2010), Bilateral carotid artery stenosis, CKD (chronic kidney disease), stage III ([place]), History of kidney stones, Hyperlipidemia, Hypertension, Long term current use of antithrombotics/antiplatelets, Prostate cancer ([place]) (03/16/2008), Sinus bradycardia by electrocardiogram, and Stroke ([place]) (01/03/2021). Balance has been an issue for about a year. Reports they thought it was an inner ear issue, went to ENT and did not see any deficits of the ear. Has some times of relapse of balance. Has not had a fall but has had many near falls.   ? How long can you sit comfortably? n/a   ? How long can you stand comfortably? 10 minutes   ? Patient Stated Goals to improve balance, plays golf and tennis   ? ?  ?  ? ?  ? ? ?Exercise/Activity  Sets/ Reps/Time/ Resistance Assistance Charge type Comments- Unless otherwise stated, CGA was provided and gait belt donned in order to ensure pt safety ?  ?Rocker board  1 x  1 minute with UE support  ?1*1 minute without UE support   Neuro re-ed Increased difficulty without upper extremity support.  Cues for proper utilization of ankle musculature as initially patient attempted to utilize hip strategies for completion of this activity.  ?      ?      ?      ?Airex step up side steps  10 x ea   Neuro re-ed Cues for proper step length.  ?SLS progression 1 foot airex 1 foot step  3 x 30 sec  ea   Neuro re-ed No loss of balance noted,  ?Squat on airex  2*10  NMR To improve lower extremity strength as well as lower extremity stability  ?Cable walking resisted  12.5# all directions   NMR  Increased difficulty with sidestepping activity particularly controlling motion upon return through eccentric portion. ?To improve stability with direction changes such as an tennis activities  ?      ?      ?      ?Treatment provided this session ? ? ?Pt educated throughout session about proper posture and technique with exercises. Improved exercise technique, movement at target joints, use of target muscles after min to mod verbal, visual, tactile cues. ?Note: Portions of this document were prepared using Dragon voice recognition software and although reviewed may contain unintentional dictation errors in syntax, grammar, or spelling. ?  ? ? ? ? ? ? ? ? ? ? ? ? ? ? ? ? ? ? ? ? ? ? ? ? ? ? ? ? ? PT Short Term Goals - 06/03/21 1213   ? ?  ? PT SHORT TERM GOAL #1  ? Title Patient will be independent in home exercise program to improve strength/mobility for better functional independence with ADLs.   ? Baseline 3/21: HEP given   ? Time 4   ? Period Weeks   ? Status New   ? Target Date 07/01/21   ? ?  ?  ? ?  ? ? ? ? PT Long Term Goals - 06/03/21 1214   ? ?  ? PT LONG TERM GOAL #1  ? Title Patient will increase FOTO score to equal to or greater than  60%   to demonstrate statistically significant improvement in mobility and quality of life.   ? Baseline 3/21: 50%   ? Time 12   ? Period Weeks   ? Status New   ? Target Date 08/26/21   ?  ? PT LONG TERM GOAL #2  ? Title Patient will increase Functional Gait Assessment score to >20/30 as to reduce fall risk and improve dynamic gait safety with community ambulation.   ? Baseline 3/21: 12/30   ? Time 12   ? Period Weeks   ? Status New   ? Target Date 08/26/21   ?  ? PT LONG TERM GOAL #3  ? Title Patient will increase ABC scale score >80% to demonstrate better functional mobility  and better confidence with ADLs.   ? Baseline 3/21: 75%   ? Time 12   ? Period Weeks   ? Status New   ? Target Date 08/26/21   ?  ? PT LONG TERM GOAL #4  ? Title Patient will increase BLE gross strength to 4+/5 as to improve functional  strength for independent gait, increased standing tolerance and increased ADL ability.   ? Baseline 3/21: see note   ? Time 12   ? Period Weeks   ? Status New   ? Target Date 08/26/21   ? ?  ?  ? ?  ? ? ? ? ? ? ? ? Plan - 06/18/21 0854   ? ? Clinical Impression Statement Patient progressed with several lower extremity dynamic balance activities.  Patient able to begin with cable walking system and although he had some difficulty controlling eccentric movements overall tolerated well without loss of balance.  We will continue to work on interventions to improve his dynamic balance interpretation for him to return to his previous activities such as playing tennis as well as golf.  Patient required extensive cues for proper form with squatting in order to minimize stress on knees as well as minimize knee pain.  Patient will continue to benefit from skilled physical therapy in order to increase his strength, mobility, balance, and return to his prior level of function.   ? Personal Factors and Comorbidities Age;Comorbidity 3+;Past/Current Experience;Time since onset of injury/illness/exacerbation   ? Comorbidities Abnormal LFTs, Anxiety, Aortic atherosclerosis ([place]), Arthritis, Basal cell carcinoma (09/14/2010), Bilateral carotid artery stenosis, CKD (chronic kidney disease), stage III ([place]), History of kidney stones, Hyperlipidemia, Hypertension, Long term current use of antithrombotics/antiplatelets, Prostate cancer ([place]) (03/16/2008), Sinus bradycardia by electrocardiogram, and Stroke ([place]) (01/03/2021)   ? Examination-Activity Limitations Bend;Carry;Reach Overhead;Locomotion Level;Lift;Squat;Stairs;Stand   ? Examination-Participation Restrictions Cleaning;Community Activity;Meal  Prep;Laundry;Shop;Volunteer;[place] Work   ? Stability/Clinical Decision Making Stable/Uncomplicated   ? Rehab Potential Good   ? PT Frequency 2x / week   ? PT Duration 12 weeks   ? PT Treatment/Interventions A

## 2021-06-20 ENCOUNTER — Ambulatory Visit: Payer: Medicare Other | Admitting: Physical Therapy

## 2021-06-20 ENCOUNTER — Encounter: Payer: Self-pay | Admitting: Physical Therapy

## 2021-06-20 DIAGNOSIS — M6281 Muscle weakness (generalized): Secondary | ICD-10-CM | POA: Diagnosis not present

## 2021-06-20 DIAGNOSIS — R2689 Other abnormalities of gait and mobility: Secondary | ICD-10-CM

## 2021-06-20 DIAGNOSIS — R2681 Unsteadiness on feet: Secondary | ICD-10-CM

## 2021-06-20 NOTE — Therapy (Signed)
?[facility] ?[place], [place], [address] ?Phone: [phone]   Fax:  [phone] ? ?Physical Therapy Treatment ? ?Patient Details  ?Name: Bobby Murillo ?[name]: [id number] ?Date of Birth: 19-Jun-1940 ?Referring Provider (PT): Deborra Medina ? ? ?Encounter Date: 06/20/2021 ? ? PT End of Session - 06/20/21 0856   ? ? Visit Number 3   ? Number of Visits 24   ? Date for PT Re-Evaluation 08/26/21   ? Authorization Type 1/10 eval 3/21   ? Equipment Utilized During Treatment Gait belt   ? Activity Tolerance Patient tolerated treatment well   ? Behavior During Therapy [facility] for tasks assessed/performed   ? ?  ?  ? ?  ? ? ?Past Medical History:  ?Diagnosis Date  ? Abnormal LFTs   ? Acute cerebrovascular accident (CVA) due to thrombosis of left middle cerebral artery ([place]) 01/05/2021  ? Anxiety   ? Aortic atherosclerosis ([place])   ? Arthritis   ? Basal cell carcinoma 09/14/2010  ? left calf   ? Bilateral carotid artery stenosis   ? a.) Carotid doppler 29/92/4268: 34-19% LICA; 6-22% RICA. b.) CTA neck 01/30/2021: critical stenosis LEFT carotid bulb.  ? Carotid stenosis, symptomatic, with infarction ([place]) 01/20/2021  ? CKD (chronic kidney disease), stage III ([place])   ? History of kidney stones   ? Hyperlipidemia   ? Hypertension   ? Long term current use of antithrombotics/antiplatelets   ? a.) DAPT therapy (ASA + clopidogrel)  ? Prostate cancer ([place]) 03/16/2008  ? Dr. Ronny Bacon  ? Sinus bradycardia by electrocardiogram   ? Stroke ([place]) 01/03/2021  ? a.) Acute infarction affecting the cortex. ?? incidental punctate acute white matter infarction just posterior to the atrium of the left lateral ventricle. Stenosis of the right M2 branch inferior division. Severe atherosclerotic disease of both PCAs with severe stenoses.  ? ? ?Past Surgical History:  ?Procedure Laterality Date  ? CATARACT EXTRACTION Bilateral   ? 05/2021  ? COLONOSCOPY    ? ENDARTERECTOMY Left 02/19/2021  ?  Procedure: ENDARTERECTOMY CAROTID;  Surgeon: Katha Cabal, MD;  Location: [facility] ORS;  Service: Vascular;  Laterality: Left;  ? PROSTATECTOMY  11/14/2008  ? transurethral, radical, Dr. Darcus Austin  ? ? ?There were no vitals filed for this visit. ? ? Subjective Assessment - 06/20/21 0851   ? ? Subjective Pt reports no falls or LOB since last session. Pt reports playing golf and realizing his power is lower than it used to be. Pt reports it is new since the onset of his stroke.   ? Pertinent History Patient has a past medical history of Abnormal LFTs, Anxiety, Aortic atherosclerosis ([place]), Arthritis, Basal cell carcinoma (09/14/2010), Bilateral carotid artery stenosis, CKD (chronic kidney disease), stage III ([place]), History of kidney stones, Hyperlipidemia, Hypertension, Long term current use of antithrombotics/antiplatelets, Prostate cancer ([place]) (03/16/2008), Sinus bradycardia by electrocardiogram, and Stroke ([place]) (01/03/2021). Balance has been an issue for about a year. Reports they thought it was an inner ear issue, went to ENT and did not see any deficits of the ear. Has some times of relapse of balance. Has not had a fall but has had many near falls.   ? How long can you sit comfortably? n/a   ? How long can you stand comfortably? 10 minutes   ? Patient Stated Goals to improve balance, plays golf and tennis   ? ?  ?  ? ?  ? ? ?Exercise/Activity Sets/ Reps/Time/ Resistance Assistance  Charge type Comments- Unless otherwise stated, CGA was provided and gait belt donned in order to ensure pt safety ?  ?Rocker board   ?1*1 minute without UE support  ?1*33mn   Neuro re-ed Increased difficulty without upper extremity support.  Cues for proper utilization of ankle musculature as initially patient attempted to utilize hip strategies for completion of this activity. ?Medium- hard  difficulty  ?      ?      ?      ?      ?SLS progression 1 foot airex 1 foot step with ball hand off  12*ea LE   Neuro re-ed Occasional UE support  required, pt report feeling a little off balance at times but had good use of ankle strategies for recovery of balance   ?Squat on airex  1*10  therex To improve lower extremity strength as well as lower extremity stability ?Patient had pain with first set of activity with ceased as a result.  Continued cues for proper form to prevent anterior translation of the femur on tibia.  ?Cable column walking resisted  17.5 # all directions   NMR  Increased difficulty with sidestepping activity particularly controlling motion upon return through eccentric portion. ?To improve stability with direction changes such as an tennis activities  ?Leg press  70 # x 15 reps  ?85# x 2 x 12 reps   therex Easy at 70 #  ?Moderate hard at 85#  ?Cues for proper foot placement as well as assistance with obtaining proper foot placement.  ?      ?      ?Treatment provided this session ? ? ?Pt educated throughout session about proper posture and technique with exercises. Improved exercise technique, movement at target joints, use of target muscles after min to mod verbal, visual, tactile cues. ?Note: Portions of this document were prepared using Dragon voice recognition software and although reviewed may contain unintentional dictation errors in syntax, grammar, or spelling. ?  ? ? ? ? ? ? ? ? ? ? ? ? ? ? ? ? ? ? ? ? ? ? ? ? ? ? ? ? ? ? ? ? ? ? PT Education - 06/20/21 0856   ? ? Education Details exercise form and technique   ? Person(s) Educated Patient   ? Methods Explanation;Demonstration;Tactile cues   ? Comprehension Verbal cues required;Verbalized understanding;Returned demonstration;Tactile cues required   ? ?  ?  ? ?  ? ? ? PT Short Term Goals - 06/03/21 1213   ? ?  ? PT SHORT TERM GOAL #1  ? Title Patient will be independent in home exercise program to improve strength/mobility for better functional independence with ADLs.   ? Baseline 3/21: HEP given   ? Time 4   ? Period Weeks   ? Status New   ? Target Date 07/01/21   ? ?  ?  ? ?   ? ? ? ? PT Long Term Goals - 06/03/21 1214   ? ?  ? PT LONG TERM GOAL #1  ? Title Patient will increase FOTO score to equal to or greater than  60%   to demonstrate statistically significant improvement in mobility and quality of life.   ? Baseline 3/21: 50%   ? Time 12   ? Period Weeks   ? Status New   ? Target Date 08/26/21   ?  ? PT LONG TERM GOAL #2  ? Title Patient will increase Functional Gait Assessment  score to >20/30 as to reduce fall risk and improve dynamic gait safety with community ambulation.   ? Baseline 3/21: 12/30   ? Time 12   ? Period Weeks   ? Status New   ? Target Date 08/26/21   ?  ? PT LONG TERM GOAL #3  ? Title Patient will increase ABC scale score >80% to demonstrate better functional mobility and better confidence with ADLs.   ? Baseline 3/21: 75%   ? Time 12   ? Period Weeks   ? Status New   ? Target Date 08/26/21   ?  ? PT LONG TERM GOAL #4  ? Title Patient will increase BLE gross strength to 4+/5 as to improve functional strength for independent gait, increased standing tolerance and increased ADL ability.   ? Baseline 3/21: see note   ? Time 12   ? Period Weeks   ? Status New   ? Target Date 08/26/21   ? ?  ?  ? ?  ? ? ? ? ? ? ? ? Plan - 06/20/21 0858   ? ? Clinical Impression Statement Patient progressed with several lower extremity dynamic balance activities. Patient able to begin with cable walking system and although he had some difficulty controlling eccentric movements overall tolerated well without loss of balance. We will continue to work on interventions to improve his dynamic balance interpretation for him to return to his previous activities such as playing tennis as well as golf. Patient required extensive cues for proper form with squatting in order to minimize stress on knees as well as minimize knee pain. Patient will continue to benefit from skilled physical therapy in order to increase his strength, mobility, balance, and return to his prior level of function.   ?  Personal Factors and Comorbidities Age;Comorbidity 3+;Past/Current Experience;Time since onset of injury/illness/exacerbation   ? Comorbidities Abnormal LFTs, Anxiety, Aortic atherosclerosis ([place]), Arthritis, Bas

## 2021-06-24 ENCOUNTER — Ambulatory Visit: Payer: Medicare Other | Admitting: Physical Therapy

## 2021-06-24 VITALS — BP 139/87 | HR 57

## 2021-06-24 DIAGNOSIS — R2689 Other abnormalities of gait and mobility: Secondary | ICD-10-CM | POA: Diagnosis not present

## 2021-06-24 DIAGNOSIS — M6281 Muscle weakness (generalized): Secondary | ICD-10-CM | POA: Diagnosis not present

## 2021-06-24 DIAGNOSIS — R2681 Unsteadiness on feet: Secondary | ICD-10-CM | POA: Diagnosis not present

## 2021-06-24 NOTE — Therapy (Signed)
[facility] ?[place], [place], [address] ?Phone: [phone]   Fax:  [phone] ? ?Physical Therapy Treatment ? ?Patient Details  ?Name: Bobby Murillo ?[name]: [id number] ?Date of Birth: 07-15-1940 ?Referring Provider (PT): Deborra Medina ? ? ?Encounter Date: 06/24/2021 ? ? PT End of Session - 06/24/21 1026   ? ? Visit Number 4   ? Number of Visits 24   ? Date for PT Re-Evaluation 08/26/21   ? Authorization Type 1/10 eval 3/21   ? Progress Note Due on Visit 10   ? PT Start Time [phone]   ? PT Stop Time 0930   ? PT Time Calculation (min) 43 min   ? Equipment Utilized During Treatment Gait belt   ? Activity Tolerance Patient tolerated treatment well   ? Behavior During Therapy [facility] for tasks assessed/performed   ? ?  ?  ? ?  ? ? ?Past Medical History:  ?Diagnosis Date  ? Abnormal LFTs   ? Acute cerebrovascular accident (CVA) due to thrombosis of left middle cerebral artery ([place]) 01/05/2021  ? Anxiety   ? Aortic atherosclerosis ([place])   ? Arthritis   ? Basal cell carcinoma 09/14/2010  ? left calf   ? Bilateral carotid artery stenosis   ? a.) Carotid doppler 65/79/0383: 33-83% LICA; 2-91% RICA. b.) CTA neck 01/30/2021: critical stenosis LEFT carotid bulb.  ? Carotid stenosis, symptomatic, with infarction ([place]) 01/20/2021  ? CKD (chronic kidney disease), stage III ([place])   ? History of kidney stones   ? Hyperlipidemia   ? Hypertension   ? Long term current use of antithrombotics/antiplatelets   ? a.) DAPT therapy (ASA + clopidogrel)  ? Prostate cancer ([place]) 03/16/2008  ? Dr. Ronny Bacon  ? Sinus bradycardia by electrocardiogram   ? Stroke ([place]) 01/03/2021  ? a.) Acute infarction affecting the cortex. ?? incidental punctate acute white matter infarction just posterior to the atrium of the left lateral ventricle. Stenosis of the right M2 branch inferior division. Severe atherosclerotic disease of both PCAs with severe stenoses.  ? ? ?Past Surgical History:  ?Procedure  Laterality Date  ? CATARACT EXTRACTION Bilateral   ? 05/2021  ? COLONOSCOPY    ? ENDARTERECTOMY Left 02/19/2021  ? Procedure: ENDARTERECTOMY CAROTID;  Surgeon: Katha Cabal, MD;  Location: [facility] ORS;  Service: Vascular;  Laterality: Left;  ? PROSTATECTOMY  11/14/2008  ? transurethral, radical, Dr. Darcus Austin  ? ? ?Vitals:  ? 06/24/21 0855  ?BP: 139/87  ?Pulse: (!) 57  ? ? ? Subjective Assessment - 06/24/21 0849   ? ? Subjective Pt reports over the past few days he has has felt a little more wobbly. Pt reports he remembers this feeling from when he first began having problems about a year ago.   ? Pertinent History Patient has a past medical history of Abnormal LFTs, Anxiety, Aortic atherosclerosis ([place]), Arthritis, Basal cell carcinoma (09/14/2010), Bilateral carotid artery stenosis, CKD (chronic kidney disease), stage III ([place]), History of kidney stones, Hyperlipidemia, Hypertension, Long term current use of antithrombotics/antiplatelets, Prostate cancer ([place]) (03/16/2008), Sinus bradycardia by electrocardiogram, and Stroke ([place]) (01/03/2021). Balance has been an issue for about a year. Reports they thought it was an inner ear issue, went to ENT and did not see any deficits of the ear. Has some times of relapse of balance. Has not had a fall but has had many near falls.   ? How long can you sit comfortably? n/a   ? How long can  you stand comfortably? 10 minutes   ? Patient Stated Goals to improve balance, plays golf and tennis   ? ?  ?  ? ?  ? ? ? ? ?Exercise/Activity Sets/ Reps/Time/ Resistance Assistance Charge type Comments- Unless otherwise stated, CGA was provided and gait belt donned in order to ensure pt safety ?  ?      ?LAQ  2 x 10 x 5 sec holds x 5# AW   Therex  Cues for proper hold times.   ?Balance obstacle course: airex turn arounds, lateral stepping to cones with tapping  X 3 times through [place] with turn around on airex, also added backward side stepping on last rep which increased  challenge   ?Airex + balance beam  + airex with turn around on airex  3 x through  ?3 x through lateral stepping  CGA   NMR  Pt reated challenging, difficulty with transitions from airex pad to NBOS on airex balance beam, fatigue noted at end   ?      ?      ?Squat on airex  1*10  therex To improve lower extremity strength as well as lower extremity stability ?Patient no pain with first set and had including where he would consider prepain on second set but no symptoms of what he would consider pain with this activity at this session indicating continued improvement in lower extremity stability and function.  ?      ?Leg press  70 # x 15 reps  ?85# x 1 x 12 reps ?100 x 10 x 10 reps    therex Easy at 70 #  ?Moderate hard at 85# and 100# ?Cues for proper foot placement as well as assistance with obtaining proper foot placement. Cues for eccentric control   ?      ?      ?Treatment provided this session ? ? ?Pt educated throughout session about proper posture and technique with exercises. Improved exercise technique, movement at target joints, use of target muscles after min to mod verbal, visual, tactile cues. ?Note: Portions of this document were prepared using Dragon voice recognition software and although reviewed may contain unintentional dictation errors in syntax, grammar, or spelling. ?  ? ? ? ? ? ? ? ? ? ? ? ? ? ? ? ? ? ? ? ? ? ? ? ? ? ? ? ? ? ? ? ? PT Education - 06/24/21 1026   ? ? Education Details exercise form and technique   ? Person(s) Educated Patient   ? Methods Explanation   ? Comprehension Verbalized understanding   ? ?  ?  ? ?  ? ? ? PT Short Term Goals - 06/03/21 1213   ? ?  ? PT SHORT TERM GOAL #1  ? Title Patient will be independent in home exercise program to improve strength/mobility for better functional independence with ADLs.   ? Baseline 3/21: HEP given   ? Time 4   ? Period Weeks   ? Status New   ? Target Date 07/01/21   ? ?  ?  ? ?  ? ? ? ? PT Long Term Goals - 06/03/21 1214   ? ?  ? PT  LONG TERM GOAL #1  ? Title Patient will increase FOTO score to equal to or greater than  60%   to demonstrate statistically significant improvement in mobility and quality of life.   ? Baseline 3/21: 50%   ? Time 12   ?  Period Weeks   ? Status New   ? Target Date 08/26/21   ?  ? PT LONG TERM GOAL #2  ? Title Patient will increase Functional Gait Assessment score to >20/30 as to reduce fall risk and improve dynamic gait safety with community ambulation.   ? Baseline 3/21: 12/30   ? Time 12   ? Period Weeks   ? Status New   ? Target Date 08/26/21   ?  ? PT LONG TERM GOAL #3  ? Title Patient will increase ABC scale score >80% to demonstrate better functional mobility and better confidence with ADLs.   ? Baseline 3/21: 75%   ? Time 12   ? Period Weeks   ? Status New   ? Target Date 08/26/21   ?  ? PT LONG TERM GOAL #4  ? Title Patient will increase BLE gross strength to 4+/5 as to improve functional strength for independent gait, increased standing tolerance and increased ADL ability.   ? Baseline 3/21: see note   ? Time 12   ? Period Weeks   ? Status New   ? Target Date 08/26/21   ? ?  ?  ? ?  ? ? ? ? ? ? ? ? Plan - 06/24/21 1027   ? ? Clinical Impression Statement Patient pleasant and demonstrates excellent motivation for completion of physical therapy session this date.  Patient progressed with several lower extremity strengthening activities in order to further improve his lower extremity strength and stability.  Patient also progressed with several dynamic balance activities number to improve his balance and ambulating difficult environments such as on golf courses as well as on unsteady terrain.  Patient will continue to benefit from skilled physical therapy intervention in order to improve his balance, strength, and return to his prior level of function.   ? Personal Factors and Comorbidities Age;Comorbidity 3+;Past/Current Experience;Time since onset of injury/illness/exacerbation   ? Comorbidities Abnormal  LFTs, Anxiety, Aortic atherosclerosis ([place]), Arthritis, Basal cell carcinoma (09/14/2010), Bilateral carotid artery stenosis, CKD (chronic kidney disease), stage III ([place]), History of kidney stones, Hyperlipidemia,

## 2021-06-27 ENCOUNTER — Ambulatory Visit: Payer: Medicare Other | Admitting: Physical Therapy

## 2021-06-27 DIAGNOSIS — M6281 Muscle weakness (generalized): Secondary | ICD-10-CM

## 2021-06-27 DIAGNOSIS — R2681 Unsteadiness on feet: Secondary | ICD-10-CM

## 2021-06-27 DIAGNOSIS — R2689 Other abnormalities of gait and mobility: Secondary | ICD-10-CM | POA: Diagnosis not present

## 2021-06-27 NOTE — Therapy (Signed)
[facility] ?[place], [place], [address] ?Phone: [phone]   Fax:  [phone] ? ?Physical Therapy Treatment ? ?Patient Details  ?Name: Bobby Murillo ?[name]: [id number] ?Date of Birth: 1940/12/03 ?Referring Provider (PT): Deborra Medina ? ? ?Encounter Date: 06/27/2021 ? ? PT End of Session - 06/27/21 1049   ? ? Visit Number 5   ? Number of Visits 24   ? Date for PT Re-Evaluation 08/26/21   ? Authorization Type 1/10 eval 3/21   ? Progress Note Due on Visit 10   ? PT Start Time 1045   ? PT Stop Time 1128   ? PT Time Calculation (min) 43 min   ? Equipment Utilized During Treatment Gait belt   ? Activity Tolerance Patient tolerated treatment well   ? Behavior During Therapy [facility] for tasks assessed/performed   ? ?  ?  ? ?  ? ? ?Past Medical History:  ?Diagnosis Date  ? Abnormal LFTs   ? Acute cerebrovascular accident (CVA) due to thrombosis of left middle cerebral artery ([place]) 01/05/2021  ? Anxiety   ? Aortic atherosclerosis ([place])   ? Arthritis   ? Basal cell carcinoma 09/14/2010  ? left calf   ? Bilateral carotid artery stenosis   ? a.) Carotid doppler 25/36/6440: 34-74% LICA; 2-59% RICA. b.) CTA neck 01/30/2021: critical stenosis LEFT carotid bulb.  ? Carotid stenosis, symptomatic, with infarction ([place]) 01/20/2021  ? CKD (chronic kidney disease), stage III ([place])   ? History of kidney stones   ? Hyperlipidemia   ? Hypertension   ? Long term current use of antithrombotics/antiplatelets   ? a.) DAPT therapy (ASA + clopidogrel)  ? Prostate cancer ([place]) 03/16/2008  ? Dr. Ronny Bacon  ? Sinus bradycardia by electrocardiogram   ? Stroke ([place]) 01/03/2021  ? a.) Acute infarction affecting the cortex. ?? incidental punctate acute white matter infarction just posterior to the atrium of the left lateral ventricle. Stenosis of the right M2 branch inferior division. Severe atherosclerotic disease of both PCAs with severe stenoses.  ? ? ?Past Surgical History:  ?Procedure  Laterality Date  ? CATARACT EXTRACTION Bilateral   ? 05/2021  ? COLONOSCOPY    ? ENDARTERECTOMY Left 02/19/2021  ? Procedure: ENDARTERECTOMY CAROTID;  Surgeon: Katha Cabal, MD;  Location: [facility] ORS;  Service: Vascular;  Laterality: Left;  ? PROSTATECTOMY  11/14/2008  ? transurethral, radical, Dr. Darcus Austin  ? ? ?There were no vitals filed for this visit. ? ? Subjective Assessment - 06/27/21 1046   ? ? Subjective Pt reports he has been doing well. The lectures he saw at [facility] were very interesting and he continues to learn a lot from them. Pt reports his recent golf started well but had some difficulty on the back 9.   ? Pertinent History Patient has a past medical history of Abnormal LFTs, Anxiety, Aortic atherosclerosis ([place]), Arthritis, Basal cell carcinoma (09/14/2010), Bilateral carotid artery stenosis, CKD (chronic kidney disease), stage III ([place]), History of kidney stones, Hyperlipidemia, Hypertension, Long term current use of antithrombotics/antiplatelets, Prostate cancer ([place]) (03/16/2008), Sinus bradycardia by electrocardiogram, and Stroke ([place]) (01/03/2021). Balance has been an issue for about a year. Reports they thought it was an inner ear issue, went to ENT and did not see any deficits of the ear. Has some times of relapse of balance. Has not had a fall but has had many near falls.   ? How long can you sit comfortably? n/a   ? How  long can you stand comfortably? 10 minutes   ? Patient Stated Goals to improve balance, plays golf and tennis   ? ?  ?  ? ?  ? ? ? ?Exercise/Activity Sets/ Reps/Time/ Resistance Assistance Charge type Comments- Unless otherwise stated, CGA was provided and gait belt donned in order to ensure pt safety ?  ?      ?LAQ  2 x 10 x 5 sec holds x 5# AW   Therex  Cues for proper hold times.   ?      ?Airex + balance beam  + airex with turn around on airex  3 x through  ?3 x through lateral stepping  CGA   NMR  Pt reated challenging, difficulty with transitions from airex pad to NBOS  on airex balance beam, fatigue noted at end   ?      ?      ?      ?      ?Leg press  85 # x 15 reps  ?100# x 1 x 12 reps ?115 x 2 x 10 reps    therex Moderate hard at 85# and 100# ?Cues for proper foot placement as well as assistance with obtaining proper foot placement. Cues for eccentric control   ?      ?6 MWT 1225 feet   Therex  HR 106 following ?Resting 58 HR  ?Will work toward endurance  ?Treatment provided this session ? ? ?Pt educated throughout session about proper posture and technique with exercises. Improved exercise technique, movement at target joints, use of target muscles after min to mod verbal, visual, tactile cues. ? ?Note: Portions of this document were prepared using Dragon voice recognition software and although reviewed may contain unintentional dictation errors in syntax, grammar, or spelling. ?  ? ? ? ? ? ? ? ? ? ? ? ? ? ? ? ? ? ? ? ? ? ? ? ? ? ? ? PT Education - 06/27/21 1048   ? ? Education Details Exercise form and technique.   ? Person(s) Educated Patient   ? Methods Explanation   ? Comprehension Verbalized understanding   ? ?  ?  ? ?  ? ? ? PT Short Term Goals - 06/03/21 1213   ? ?  ? PT SHORT TERM GOAL #1  ? Title Patient will be independent in home exercise program to improve strength/mobility for better functional independence with ADLs.   ? Baseline 3/21: HEP given   ? Time 4   ? Period Weeks   ? Status New   ? Target Date 07/01/21   ? ?  ?  ? ?  ? ? ? ? PT Long Term Goals - 06/03/21 1214   ? ?  ? PT LONG TERM GOAL #1  ? Title Patient will increase FOTO score to equal to or greater than  60%   to demonstrate statistically significant improvement in mobility and quality of life.   ? Baseline 3/21: 50%   ? Time 12   ? Period Weeks   ? Status New   ? Target Date 08/26/21   ?  ? PT LONG TERM GOAL #2  ? Title Patient will increase Functional Gait Assessment score to >20/30 as to reduce fall risk and improve dynamic gait safety with community ambulation.   ? Baseline 3/21: 12/30   ? Time  12   ? Period Weeks   ? Status New   ? Target Date 08/26/21   ?  ?  PT LONG TERM GOAL #3  ? Title Patient will increase ABC scale score >80% to demonstrate better functional mobility and better confidence with ADLs.   ? Baseline 3/21: 75%   ? Time 12   ? Period Weeks   ? Status New   ? Target Date 08/26/21   ?  ? PT LONG TERM GOAL #4  ? Title Patient will increase BLE gross strength to 4+/5 as to improve functional strength for independent gait, increased standing tolerance and increased ADL ability.   ? Baseline 3/21: see note   ? Time 12   ? Period Weeks   ? Status New   ? Target Date 08/26/21   ? ?  ?  ? ?  ? ? ? ? ? ? ? ? Plan - 06/27/21 1049   ? ? Clinical Impression Statement Patient pleasant and demonstrates excellent motivation for completion of physical therapy session this date. Patient progressed with several lower extremity strengthening activities in order to further improve his lower extremity strength and stability. Patient also progressed with several dynamic balance activities number to improve his balance and ambulating difficult environments such as on golf courses as well as on unsteady terrain. Patient will continue to benefit from skilled physical therapy intervention in order to improve his balance, strength, and return to his prior level of function.   ? Personal Factors and Comorbidities Age;Comorbidity 3+;Past/Current Experience;Time since onset of injury/illness/exacerbation   ? Comorbidities Abnormal LFTs, Anxiety, Aortic atherosclerosis ([place]), Arthritis, Basal cell carcinoma (09/14/2010), Bilateral carotid artery stenosis, CKD (chronic kidney disease), stage III ([place]), History of kidney stones, Hyperlipidemia, Hypertension, Long term current use of antithrombotics/antiplatelets, Prostate cancer ([place]) (03/16/2008), Sinus bradycardia by electrocardiogram, and Stroke ([place]) (01/03/2021)   ? Examination-Activity Limitations Bend;Carry;Reach Overhead;Locomotion Level;Lift;Squat;Stairs;Stand   ?  Examination-Participation Restrictions Cleaning;Community Activity;Meal Prep;Laundry;Shop;Volunteer;[place] Work   ? Stability/Clinical Decision Making Stable/Uncomplicated   ? Rehab Potential Good   ? PT F

## 2021-07-02 ENCOUNTER — Encounter: Payer: Self-pay | Admitting: Physical Therapy

## 2021-07-02 ENCOUNTER — Ambulatory Visit: Payer: Medicare Other | Admitting: Physical Therapy

## 2021-07-02 DIAGNOSIS — R2681 Unsteadiness on feet: Secondary | ICD-10-CM

## 2021-07-02 DIAGNOSIS — M6281 Muscle weakness (generalized): Secondary | ICD-10-CM | POA: Diagnosis not present

## 2021-07-02 DIAGNOSIS — R2689 Other abnormalities of gait and mobility: Secondary | ICD-10-CM

## 2021-07-02 NOTE — Therapy (Signed)
[place] [facility] ?[place], [place], [address] ?Phone: [phone]   Fax:  [phone] ? ?Physical Therapy Treatment ? ?Patient Details  ?Name: Bobby Murillo ?[name]: [id number] ?Date of Birth: 1940/12/01 ?Referring Provider (PT): Deborra Medina ? ? ?Encounter Date: 07/02/2021 ? ? PT End of Session - 07/02/21 0843   ? ? Visit Number 6   ? Number of Visits 24   ? Date for PT Re-Evaluation 08/26/21   ? Authorization Type 1/10 eval 3/21   ? Progress Note Due on Visit 10   ? PT Start Time [phone]   ? PT Stop Time 1607   ? PT Time Calculation (min) 43 min   ? Equipment Utilized During Treatment Gait belt   ? Activity Tolerance Patient tolerated treatment well   ? Behavior During Therapy [facility] for tasks assessed/performed   ? ?  ?  ? ?  ? ? ?Past Medical History:  ?Diagnosis Date  ? Abnormal LFTs   ? Acute cerebrovascular accident (CVA) due to thrombosis of left middle cerebral artery ([place]) 01/05/2021  ? Anxiety   ? Aortic atherosclerosis ([place])   ? Arthritis   ? Basal cell carcinoma 09/14/2010  ? left calf   ? Bilateral carotid artery stenosis   ? a.) Carotid doppler 37/12/6267: 48-54% LICA; 6-27% RICA. b.) CTA neck 01/30/2021: critical stenosis LEFT carotid bulb.  ? Carotid stenosis, symptomatic, with infarction ([place]) 01/20/2021  ? CKD (chronic kidney disease), stage III ([place])   ? History of kidney stones   ? Hyperlipidemia   ? Hypertension   ? Long term current use of antithrombotics/antiplatelets   ? a.) DAPT therapy (ASA + clopidogrel)  ? Prostate cancer ([place]) 03/16/2008  ? Dr. Ronny Bacon  ? Sinus bradycardia by electrocardiogram   ? Stroke ([place]) 01/03/2021  ? a.) Acute infarction affecting the cortex. ?? incidental punctate acute white matter infarction just posterior to the atrium of the left lateral ventricle. Stenosis of the right M2 branch inferior division. Severe atherosclerotic disease of both PCAs with severe stenoses.  ? ? ?Past Surgical History:  ?Procedure  Laterality Date  ? CATARACT EXTRACTION Bilateral   ? 05/2021  ? COLONOSCOPY    ? ENDARTERECTOMY Left 02/19/2021  ? Procedure: ENDARTERECTOMY CAROTID;  Surgeon: Katha Cabal, MD;  Location: [facility] ORS;  Service: Vascular;  Laterality: Left;  ? PROSTATECTOMY  11/14/2008  ? transurethral, radical, Dr. Darcus Austin  ? ? ?There were no vitals filed for this visit. ? ? Subjective Assessment - 07/02/21 0842   ? ? Subjective Pt reports he has been doing well. Reports some kee pain at this time but he used voltarin and it was helpful.   ? Pertinent History Patient has a past medical history of Abnormal LFTs, Anxiety, Aortic atherosclerosis ([place]), Arthritis, Basal cell carcinoma (09/14/2010), Bilateral carotid artery stenosis, CKD (chronic kidney disease), stage III ([place]), History of kidney stones, Hyperlipidemia, Hypertension, Long term current use of antithrombotics/antiplatelets, Prostate cancer ([place]) (03/16/2008), Sinus bradycardia by electrocardiogram, and Stroke ([place]) (01/03/2021). Balance has been an issue for about a year. Reports they thought it was an inner ear issue, went to ENT and did not see any deficits of the ear. Has some times of relapse of balance. Has not had a fall but has had many near falls.   ? How long can you sit comfortably? n/a   ? How long can you stand comfortably? 10 minutes   ? Patient Stated Goals to improve balance, plays golf  and tennis   ? Currently in Pain? Yes   ? Pain Score 3    ? ?  ?  ? ?  ? ? ?Exercise/Activity Sets/ Reps/Time/ Resistance Assistance Charge type Comments- Unless otherwise stated, CGA was provided and gait belt donned in order to ensure pt safety ?  ?Nustep intervals  Level 2 x 2 min ?Level 4 x 1 ?Level 3 x 1  ?Level 5 x 1 ?Level 3 x 1 ?Level 5 x 1  ?Level 3 x 1   therex Assistance with set up and cues for SPM > 60   ?      ?BOSU balance ball static stance shoulder width stance  3 x 45 sec  CGA  NMR  Good use of ankle strategies for maintenance of balance.   ?Airex +  balance beam  + airex with turn around on airex  5 x through  ?5 x through lateral stepping  CGA   NMR  Pt rated challenging, difficulty with transitions from airex pad to NBOS on airex balance beam, fatigue noted at end  with decreased stability on last repetitions   ?Cable system walking with airex step up  X 2 laps each direction  CGA NMR With left resisted stepping pt had Lob when transitioning form airex to floor ( eccentric control portion)  ?Corrected via CGA and stepping pattern, improved balance efficacy on second round   ?      ?      ?      ?Leg press  85 # x 15 reps  ?100# x 1 x 12 reps ?115# x 2 x 10 reps    therex Pt rates medium -hard overall for this activity  ?Cues for proper foot placement as well as assistance with obtaining proper foot placement. Cues for eccentric control   ?      ?      ?Treatment provided this session ? ? ?Pt educated throughout session about proper posture and technique with exercises. Improved exercise technique, movement at target joints, use of target muscles after min to mod verbal, visual, tactile cues. ? ?Note: Portions of this document were prepared using Dragon voice recognition software and although reviewed may contain unintentional dictation errors in syntax, grammar, or spelling. ? ? ? ? ? ? ? ? ? ? ? ? ? ? ? ? ? ? ? ? ? ? ? ? ? ? PT Education - 07/02/21 0843   ? ? Education Details exercise form and technique   ? Person(s) Educated Patient   ? Methods Explanation   ? Comprehension Verbalized understanding   ? ?  ?  ? ?  ? ? ? PT Short Term Goals - 06/03/21 1213   ? ?  ? PT SHORT TERM GOAL #1  ? Title Patient will be independent in home exercise program to improve strength/mobility for better functional independence with ADLs.   ? Baseline 3/21: HEP given   ? Time 4   ? Period Weeks   ? Status New   ? Target Date 07/01/21   ? ?  ?  ? ?  ? ? ? ? PT Long Term Goals - 06/27/21 1138   ? ?  ? PT LONG TERM GOAL #1  ? Title Patient will increase FOTO score to equal to or  greater than  60%   to demonstrate statistically significant improvement in mobility and quality of life.   ? Baseline 3/21: 50%   ? Time 12   ?  Period Weeks   ? Status New   ? Target Date 08/26/21   ?  ? PT LONG TERM GOAL #2  ? Title Patient will increase Functional Gait Assessment score to >20/30 as to reduce fall risk and improve dynamic gait safety with community ambulation.   ? Baseline 3/21: 12/30   ? Time 12   ? Period Weeks   ? Status New   ? Target Date 08/26/21   ?  ? PT LONG TERM GOAL #3  ? Title Patient will increase ABC scale score >80% to demonstrate better functional mobility and better confidence with ADLs.   ? Baseline 3/21: 75%   ? Time 12   ? Period Weeks   ? Status New   ? Target Date 08/26/21   ?  ? PT LONG TERM GOAL #4  ? Title Patient will increase BLE gross strength to 4+/5 as to improve functional strength for independent gait, increased standing tolerance and increased ADL ability.   ? Baseline 3/21: see note   ? Time 12   ? Period Weeks   ? Status New   ? Target Date 08/26/21   ?  ? PT LONG TERM GOAL #5  ? Title Patient will improve 6-minute walk test to greater than or equal to 1500 feet in order to improve to age-adjusted norm and to improve patient's endurance with ambulation   ? Baseline 1225 ft   ? Time 8   ? Period Weeks   ? Status New   ? Target Date 08/22/21   ? ?  ?  ? ?  ? ? ? ? ? ? ? ? Plan - 07/02/21 0843   ? ? Clinical Impression Statement Pt pleasant and demonstrates excellent motivation for completion of PT program. Pt progresses with LE endurance and strenght training with introduction of Nustep activities this sesion. Pt able to tolerate this well but did experience some fatigue with this task. Pt continues to have difficulty with transitions on unsteady surfaces and requires CGA with these activities for safety. Pt will ocnitnue to benefit from skilled pT in order to ipmrove balance, strength, endurance, and QOL.   ? Personal Factors and Comorbidities Age;Comorbidity  3+;Past/Current Experience;Time since onset of injury/illness/exacerbation   ? Comorbidities Abnormal LFTs, Anxiety, Aortic atherosclerosis ([place]), Arthritis, Basal cell carcinoma (09/14/2010), Bilateral carotid ar

## 2021-07-04 ENCOUNTER — Ambulatory Visit: Payer: Medicare Other | Admitting: Physical Therapy

## 2021-07-04 DIAGNOSIS — R2689 Other abnormalities of gait and mobility: Secondary | ICD-10-CM | POA: Diagnosis not present

## 2021-07-04 DIAGNOSIS — R2681 Unsteadiness on feet: Secondary | ICD-10-CM

## 2021-07-04 DIAGNOSIS — M6281 Muscle weakness (generalized): Secondary | ICD-10-CM

## 2021-07-04 NOTE — Therapy (Signed)
[place] ?[facility] ?[place], [place], [address] ?Phone: [phone]   Fax:  [phone] ? ?Physical Therapy Treatment ? ?Patient Details  ?Name: Bobby Murillo ?[name]: [id number] ?Date of Birth: March 23, 1940 ?Referring Provider (PT): Deborra Medina ? ? ?Encounter Date: 07/04/2021 ? ? PT End of Session - 07/04/21 0850   ? ? Visit Number 7   ? Number of Visits 24   ? Date for PT Re-Evaluation 08/26/21   ? Authorization Type 1/10 eval 3/21   ? Progress Note Due on Visit 10   ? PT Start Time [phone]   ? PT Stop Time 0930   ? PT Time Calculation (min) 43 min   ? Equipment Utilized During Treatment Gait belt   ? Activity Tolerance Patient tolerated treatment well   ? Behavior During Therapy [facility] for tasks assessed/performed   ? ?  ?  ? ?  ? ? ?Past Medical History:  ?Diagnosis Date  ? Abnormal LFTs   ? Acute cerebrovascular accident (CVA) due to thrombosis of left middle cerebral artery ([address] [place]) 01/05/2021  ? Anxiety   ? Aortic atherosclerosis ([place])   ? Arthritis   ? Basal cell carcinoma 09/14/2010  ? left calf   ? Bilateral carotid artery stenosis   ? a.) Carotid doppler 57/32/2025: 42-70% LICA; 6-23% RICA. b.) CTA neck 01/30/2021: critical stenosis LEFT carotid bulb.  ? Carotid stenosis, symptomatic, with infarction ([place]) 01/20/2021  ? CKD (chronic kidney disease), stage III ([place])   ? History of kidney stones   ? Hyperlipidemia   ? Hypertension   ? Long term current use of antithrombotics/antiplatelets   ? a.) DAPT therapy (ASA + clopidogrel)  ? Prostate cancer ([place]) 03/16/2008  ? Dr. Ronny Bacon  ? Sinus bradycardia by electrocardiogram   ? Stroke ([place]) 01/03/2021  ? a.) Acute infarction affecting the cortex. ?? incidental punctate acute white matter infarction just posterior to the atrium of the left lateral ventricle. Stenosis of the right M2 branch inferior division. Severe atherosclerotic disease of both PCAs with severe stenoses.  ? ? ?Past Surgical History:  ?Procedure  Laterality Date  ? CATARACT EXTRACTION Bilateral   ? 05/2021  ? COLONOSCOPY    ? ENDARTERECTOMY Left 02/19/2021  ? Procedure: ENDARTERECTOMY CAROTID;  Surgeon: Katha Cabal, MD;  Location: [facility] ORS;  Service: Vascular;  Laterality: Left;  ? PROSTATECTOMY  11/14/2008  ? transurethral, radical, Dr. Darcus Austin  ? ? ?There were no vitals filed for this visit. ? ? Subjective Assessment - 07/04/21 0845   ? ? Subjective Pt reports he has been doing well but is feeling a little tired at this time. Pt reports he had a little knee pain yesterday playiung golf but he does not recall it limiting his play.   ? Pertinent History Patient has a past medical history of Abnormal LFTs, Anxiety, Aortic atherosclerosis ([place]), Arthritis, Basal cell carcinoma (09/14/2010), Bilateral carotid artery stenosis, CKD (chronic kidney disease), stage III ([place]), History of kidney stones, Hyperlipidemia, Hypertension, Long term current use of antithrombotics/antiplatelets, Prostate cancer ([place]) (03/16/2008), Sinus bradycardia by electrocardiogram, and Stroke ([place]) (01/03/2021). Balance has been an issue for about a year. Reports they thought it was an inner ear issue, went to ENT and did not see any deficits of the ear. Has some times of relapse of balance. Has not had a fall but has had many near falls.   ? How long can you sit comfortably? n/a   ? How long can you stand  comfortably? 10 minutes   ? Patient Stated Goals to improve balance, plays golf and tennis   ? Currently in Pain? No/denies   ? Pain Score 0-No pain   ? ?  ?  ? ?  ? ? ? ? ? ? ? ?Exercise/Activity Sets/ Reps/Time/ Resistance Assistance Charge type Comments- Unless otherwise stated, CGA was provided and gait belt donned in order to ensure pt safety ?  ? ?Biodex TM- gait trainer using UE support and gait belt-    Gait training  Step length= _.5_ m RLE_.55_ m L ?Time on each foot _49_%R/_51_%L ?Gait velocity= .75 m/s ?Total Time on TM = 8 min   ?      ?BOSU balance ball static stance  shoulder width stance  1 x 45 sec  ?1*45 sec with head turns  CGA  NMR  Good use of ankle strategies for maintenance of balance.   ?Airex balance beam dual task balance white board game  X 1 round  CGA NMR Weight shifting to edge of BOS and sidestepping on airex balance beam.   ?      ?      ?      ?      ?Leg press   ?100# x 1 x 12 reps ?115# x 2 x 12 reps    therex Pt rates medium -hard overall for this activity  ?Cues for proper foot placement as well as assistance with obtaining proper foot placement. Cues for eccentric control   ?      ?      ?Treatment provided this session ? ? ?Pt educated throughout session about proper posture and technique with exercises. Improved exercise technique, movement at target joints, use of target muscles after min to mod verbal, visual, tactile cues. ? ?Note: Portions of this document were prepared using Dragon voice recognition software and although reviewed may contain unintentional dictation errors in syntax, grammar, or spelling. ? ? ? ? ? ? ? ? ? ? ? ? ? ? ? ? ? ? ? ? ? ? ? ? ? ? ? ? ? PT Education - 07/04/21 0850   ? ? Education Details exercise form and technique   ? Person(s) Educated Patient   ? Methods Explanation   ? Comprehension Verbalized understanding   ? ?  ?  ? ?  ? ? ? PT Short Term Goals - 06/03/21 1213   ? ?  ? PT SHORT TERM GOAL #1  ? Title Patient will be independent in home exercise program to improve strength/mobility for better functional independence with ADLs.   ? Baseline 3/21: HEP given   ? Time 4   ? Period Weeks   ? Status New   ? Target Date 07/01/21   ? ?  ?  ? ?  ? ? ? ? PT Long Term Goals - 06/27/21 1138   ? ?  ? PT LONG TERM GOAL #1  ? Title Patient will increase FOTO score to equal to or greater than  60%   to demonstrate statistically significant improvement in mobility and quality of life.   ? Baseline 3/21: 50%   ? Time 12   ? Period Weeks   ? Status New   ? Target Date 08/26/21   ?  ? PT LONG TERM GOAL #2  ? Title Patient will increase  Functional Gait Assessment score to >20/30 as to reduce fall risk and improve dynamic gait safety with community ambulation.   ?  Baseline 3/21: 12/30   ? Time 12   ? Period Weeks   ? Status New   ? Target Date 08/26/21   ?  ? PT LONG TERM GOAL #3  ? Title Patient will increase ABC scale score >80% to demonstrate better functional mobility and better confidence with ADLs.   ? Baseline 3/21: 75%   ? Time 12   ? Period Weeks   ? Status New   ? Target Date 08/26/21   ?  ? PT LONG TERM GOAL #4  ? Title Patient will increase BLE gross strength to 4+/5 as to improve functional strength for independent gait, increased standing tolerance and increased ADL ability.   ? Baseline 3/21: see note   ? Time 12   ? Period Weeks   ? Status New   ? Target Date 08/26/21   ?  ? PT LONG TERM GOAL #5  ? Title Patient will improve 6-minute walk test to greater than or equal to 1500 feet in order to improve to age-adjusted norm and to improve patient's endurance with ambulation   ? Baseline 1225 ft   ? Time 8   ? Period Weeks   ? Status New   ? Target Date 08/22/21   ? ?  ?  ? ?  ? ? ? ? ? ? ? ? Plan - 07/04/21 0850   ? ? Clinical Impression Statement Patient is pleasant and demonstrates excellent motivation for completion of physical therapy program.  Patient progressed with ambulation training in order to try to improve his step length on the right side to normalize his gait pattern.  With practice on the Biodex treadmill patient demonstrated improved right lower extremity step length but on average over course of walking.  Patient still spending more time on the left lower extremity as well as taking shorter steps on the right lower extremity compared to the left.  We will continue to work on this utilizing interventions in future session.  Patient did continue demonstrate improved strength on leg press activity as well as good balance responses during dual task balance activities this session.  Patient will continue to benefit from  skilled physical therapy intervention in order to improve lower extremity strength, balance, gait pattern, and improve his overall quality of life.   ? Personal Factors and Comorbidities Age;Comorbidity 3+;Pas

## 2021-07-09 ENCOUNTER — Ambulatory Visit: Payer: Medicare Other | Admitting: Physical Therapy

## 2021-07-09 DIAGNOSIS — R2689 Other abnormalities of gait and mobility: Secondary | ICD-10-CM

## 2021-07-09 DIAGNOSIS — R2681 Unsteadiness on feet: Secondary | ICD-10-CM | POA: Diagnosis not present

## 2021-07-09 DIAGNOSIS — M6281 Muscle weakness (generalized): Secondary | ICD-10-CM | POA: Diagnosis not present

## 2021-07-09 NOTE — Therapy (Signed)
[facility] ?[place], [place], [address] ?Phone: [phone]   Fax:  [phone] ? ?Physical Therapy Treatment ? ?Patient Details  ?Name: Bobby Murillo ?[name]: [id number] ?Date of Birth: [birth year] ?Referring Provider (PT): Deborra Medina ? ? ?Encounter Date: 07/09/2021 ? ? PT End of Session - 07/09/21 0834   ? ? Visit Number 8   ? Number of Visits 24   ? Date for PT Re-Evaluation 08/26/21   ? Authorization Type 1/10 eval 3/21   ? Progress Note Due on Visit 10   ? PT Start Time 0831   ? PT Stop Time 3762   ? PT Time Calculation (min) 44 min   ? Equipment Utilized During Treatment Gait belt   ? Activity Tolerance Patient tolerated treatment well   ? Behavior During Therapy [facility] for tasks assessed/performed   ? ?  ?  ? ?  ? ? ?Past Medical History:  ?Diagnosis Date  ? Abnormal LFTs   ? Acute cerebrovascular accident (CVA) due to thrombosis of left middle cerebral artery ([place]) 01/05/2021  ? Anxiety   ? Aortic atherosclerosis ([place])   ? Arthritis   ? Basal cell carcinoma 09/14/2010  ? left calf   ? Bilateral carotid artery stenosis   ? a.) Carotid doppler 83/15/1761: 60-73% LICA; 7-10% RICA. b.) CTA neck 01/30/2021: critical stenosis LEFT carotid bulb.  ? Carotid stenosis, symptomatic, with infarction ([place]) 01/20/2021  ? CKD (chronic kidney disease), stage III ([place])   ? History of kidney stones   ? Hyperlipidemia   ? Hypertension   ? Long term current use of antithrombotics/antiplatelets   ? a.) DAPT therapy (ASA + clopidogrel)  ? Prostate cancer ([place]) 03/16/2008  ? Dr. Ronny Bacon  ? Sinus bradycardia by electrocardiogram   ? Stroke ([place]) 01/03/2021  ? a.) Acute infarction affecting the cortex. ?? incidental punctate acute white matter infarction just posterior to the atrium of the left lateral ventricle. Stenosis of the right M2 branch inferior division. Severe atherosclerotic disease of both PCAs with severe stenoses.  ? ? ?Past Surgical History:  ?Procedure  Laterality Date  ? CATARACT EXTRACTION Bilateral   ? 05/2021  ? COLONOSCOPY    ? ENDARTERECTOMY Left 02/19/2021  ? Procedure: ENDARTERECTOMY CAROTID;  Surgeon: Katha Cabal, MD;  Location: [facility] ORS;  Service: Vascular;  Laterality: Left;  ? PROSTATECTOMY  11/14/2008  ? transurethral, radical, Dr. Darcus Austin  ? ? ?There were no vitals filed for this visit. ? ? Subjective Assessment - 07/09/21 0832   ? ? Subjective Pt reports he was able to golf yesterday but still has some knee pain at this time following golfing.  Pt reports he had a little knee pain yesterday playiung golf but he does not recall it limiting his play.   ? Pertinent History Patient has a past medical history of Abnormal LFTs, Anxiety, Aortic atherosclerosis ([place]), Arthritis, Basal cell carcinoma (09/14/2010), Bilateral carotid artery stenosis, CKD (chronic kidney disease), stage III ([place]), History of kidney stones, Hyperlipidemia, Hypertension, Long term current use of antithrombotics/antiplatelets, Prostate cancer ([place]) (03/16/2008), Sinus bradycardia by electrocardiogram, and Stroke ([place]) (01/03/2021). Balance has been an issue for about a year. Reports they thought it was an inner ear issue, went to ENT and did not see any deficits of the ear. Has some times of relapse of balance. Has not had a fall but has had many near falls.   ? How long can you sit comfortably? n/a   ? How  long can you stand comfortably? 10 minutes   ? Patient Stated Goals to improve balance, plays golf and tennis   ? ?  ?  ? ?  ? ? ? ? ?Exercise/Activity Sets/ Reps/Time/ Resistance Assistance Charge type Comments- Unless otherwise stated, CGA was provided and gait belt donned in order to ensure pt safety ?  ?Nustep intervals  Level 2 x 2 min ?Level 4 x 1 ?Level 3 x 1  ?Level 5 x 1 ?Level 3 x 1 ?Level 5 x 1  ?Level 3 x 1   therex Assistance with set up and cues for SPM > 60   ?Ladder drills  X 5 rounds each   Gait training  First normal ladder drill with decreasing UE assist.   ?Second with airex pad turn around without UE assist throughout   ?Last with forward followed by retro stepping ?Improved with practice with fewer errors   ?BOSU ball step up  X 10 ea LE   NMR To improve LE strength and stability on unsteady surfaces   ?1 LE bosu and 1 LE on 6 in step  X 30 sec ea  CGA NMR UE assit for positioning, cues for post weight shift to challenge LE on BOSU, good ankle righting reactions  ?Cable system walking with airex step up  X 2 laps each direction  CGA NMR With left resisted stepping pt had Lob when transitioning form airex to floor ( eccentric control portion)  ?Corrected via CGA and stepping pattern, improved balance efficacy on second round   ?      ?      ?      ?Leg press   ?100# x 1 x 15 reps ?115# x 2 x 12 reps    therex Pt rates medium -hard overall for this activity  ?Cues  for proper foot placement as well as assistance with obtaining proper foot placement. Cues for eccentric control   ?      ?      ?Treatment provided this session ? ? ?Pt educated throughout session about proper posture and technique with exercises. Improved exercise technique, movement at target joints, use of target muscles after min to mod verbal, visual, tactile cues. ? ?Note: Portions of this document were prepared using Dragon voice recognition software and although reviewed may contain unintentional dictation errors in syntax, grammar, or spelling. ? ? ? ? ? ? ? ? ? ? ? ? ? ? ? ? ? ? ? ? ? ? ? ? ? PT Education - 07/09/21 0832   ? ? Education Details exercise form and technique   ? Person(s) Educated Patient   ? Methods Explanation   ? Comprehension Verbalized understanding   ? ?  ?  ? ?  ? ? ? PT Short Term Goals - 06/03/21 1213   ? ?  ? PT SHORT TERM GOAL #1  ? Title Patient will be independent in home exercise program to improve strength/mobility for better functional independence with ADLs.   ? Baseline 3/21: HEP given   ? Time 4   ? Period Weeks   ? Status New   ? Target Date 07/01/21   ? ?  ?   ? ?  ? ? ? ? PT Long Term Goals - 06/27/21 1138   ? ?  ? PT LONG TERM GOAL #1  ? Title Patient will increase FOTO score to equal to or greater than  60%   to demonstrate statistically significant improvement  in mobility and quality of life.   ? Baseline 3/21: 50%   ? Time 12   ? Period Weeks   ? Status New   ? Target Date 08/26/21   ?  ? PT LONG TERM GOAL #2  ? Title Patient will increase Functional Gait Assessment score to >20/30 as to reduce fall risk and improve dynamic gait safety with community ambulation.   ? Baseline 3/21: 12/30   ? Time 12   ? Period Weeks   ? Status New   ? Target Date 08/26/21   ?  ? PT LONG TERM GOAL #3  ? Title Patient will increase ABC scale score >80% to demonstrate better functional mobility and better confidence with ADLs.   ? Baseline 3/21: 75%   ? Time 12   ? Period Weeks   ? Status New   ? Target Date 08/26/21   ?  ? PT LONG TERM GOAL #4  ? Title Patient will increase BLE gross strength to 4+/5 as to improve functional strength for independent gait, increased standing tolerance and increased ADL ability.   ? Baseline 3/21: see note   ? Time 12   ? Period Weeks   ? Status New   ? Target Date 08/26/21   ?  ? PT LONG TERM GOAL #5  ? Title Patient will improve 6-minute walk test to greater than or equal to 1500 feet in order to improve to age-adjusted norm and to improve patient's endurance with ambulation   ? Baseline 1225 ft   ? Time 8   ? Period Weeks   ? Status New   ? Target Date 08/22/21   ? ?  ?  ? ?  ? ? ? ? ? ? ? ? Plan - 07/09/21 0834   ? ? Clinical Impression Statement Patient is pleasant and demonstrates excellent motivation for completion of physical therapy program. Patient continues to demonstrate improved strength on leg press activity as well as good balance responses during static and dynamic balance activities this session. Pt does continue to have some pain and fatigue which limit him with golfing activities but this continues to ipmrove. Patient will continue to  benefit from skilled physical therapy intervention in order to improve lower extremity strength, balance, gait pattern, and improve his overall quality of life.   ? Personal Factors and Comorbidities Age;Co

## 2021-07-10 ENCOUNTER — Other Ambulatory Visit: Payer: Self-pay | Admitting: Internal Medicine

## 2021-07-11 ENCOUNTER — Ambulatory Visit: Payer: Medicare Other | Admitting: Physical Therapy

## 2021-07-11 ENCOUNTER — Encounter: Payer: Self-pay | Admitting: Physical Therapy

## 2021-07-11 DIAGNOSIS — M6281 Muscle weakness (generalized): Secondary | ICD-10-CM | POA: Diagnosis not present

## 2021-07-11 DIAGNOSIS — R2689 Other abnormalities of gait and mobility: Secondary | ICD-10-CM | POA: Diagnosis not present

## 2021-07-11 DIAGNOSIS — R2681 Unsteadiness on feet: Secondary | ICD-10-CM | POA: Diagnosis not present

## 2021-07-11 NOTE — Therapy (Signed)
[facility] ?[place], [place], [address] ?Phone: [phone]   Fax:  [phone] ? ?Physical Therapy Treatment ? ?Patient Details  ?Name: Bobby Murillo ?[name]: [id number] ?Date of Birth: [birth year] ?Referring Provider (PT): Bobby Murillo ? ? ?Encounter Date: 07/11/2021 ? ? PT End of Session - 07/11/21 1700   ? ? Visit Number 9   ? Number of Visits 24   ? Date for PT Re-Evaluation 08/26/21   ? Authorization Type 1/10 eval 3/21   ? Progress Note Due on Visit 10   ? PT Start Time [phone]   ? PT Stop Time 0917   ? PT Time Calculation (min) 44 min   ? Equipment Utilized During Treatment Gait belt   ? Activity Tolerance Patient tolerated treatment well   ? Behavior During Therapy [facility] for tasks assessed/performed   ? ?  ?  ? ?  ? ? ?Past Medical History:  ?Diagnosis Date  ? Abnormal LFTs   ? Acute cerebrovascular accident (CVA) due to thrombosis of left middle cerebral artery ([place]) 01/05/2021  ? Anxiety   ? Aortic atherosclerosis ([place])   ? Arthritis   ? Basal cell carcinoma 09/14/2010  ? left calf   ? Bilateral carotid artery stenosis   ? a.) Carotid doppler 44/96/7591: 63-84% LICA; 6-65% RICA. b.) CTA neck 01/30/2021: critical stenosis LEFT carotid bulb.  ? Carotid stenosis, symptomatic, with infarction ([place]) 01/20/2021  ? CKD (chronic kidney disease), stage III ([place])   ? History of kidney stones   ? Hyperlipidemia   ? Hypertension   ? Long term current use of antithrombotics/antiplatelets   ? a.) DAPT therapy (ASA + clopidogrel)  ? Prostate cancer ([place]) 03/16/2008  ? Dr. Ronny Murillo  ? Sinus bradycardia by electrocardiogram   ? Stroke ([place]) 01/03/2021  ? a.) Acute infarction affecting the cortex. ?? incidental punctate acute white matter infarction just posterior to the atrium of the left lateral ventricle. Stenosis of the right M2 branch inferior division. Severe atherosclerotic disease of both PCAs with severe stenoses.  ? ? ?Past Surgical History:  ?Procedure  Laterality Date  ? CATARACT EXTRACTION Bilateral   ? 05/2021  ? COLONOSCOPY    ? ENDARTERECTOMY Left 02/19/2021  ? Procedure: ENDARTERECTOMY CAROTID;  Surgeon: Bobby Cabal, MD;  Location: [facility] ORS;  Service: Vascular;  Laterality: Left;  ? PROSTATECTOMY  11/14/2008  ? transurethral, radical, Dr. Darcus Austin  ? ? ?There were no vitals filed for this visit. ? ? Subjective Assessment - 07/11/21 0843   ? ? Subjective Pt reports some soreness in his feet and knees from golf yesterday. NO LOB or other changes since last session. Pt reports Bobby Murillo lecture was not as good as he had hoped.   ? Pertinent History Patient has a past medical history of Abnormal LFTs, Anxiety, Aortic atherosclerosis ([place]), Arthritis, Basal cell carcinoma (09/14/2010), Bilateral carotid artery stenosis, CKD (chronic kidney disease), stage III ([place]), History of kidney stones, Hyperlipidemia, Hypertension, Long term current use of antithrombotics/antiplatelets, Prostate cancer ([place]) (03/16/2008), Sinus bradycardia by electrocardiogram, and Stroke ([place]) (01/03/2021). Balance has been an issue for about a year. Reports they thought it was an inner ear issue, went to ENT and did not see any deficits of the ear. Has some times of relapse of balance. Has not had a fall but has had many near falls.   ? Limitations Walking   ? How long can you sit comfortably? n/a   ? How long can you  stand comfortably? 10 minutes   ? Patient Stated Goals to improve balance, plays golf and tennis   ? ?  ?  ? ?  ? ? ?Exercise/Activity Sets/ Reps/Time/ Resistance Assistance Charge type Comments- Unless otherwise stated, CGA was provided and gait belt donned in order to ensure pt safety ?  ? ?Biodex TM- gait trainer using UE support and gait belt-    Gait training  Step length= _.54_ m RLE_.59_ m L ?Time on each foot _49_%R/_51_%L ?Gait velocity= .84 m/s ?Total Time on TM = 7 min   ?Tandem walking no UE support  2 x 10 meters  CGA  NMR    ?Tandem balance on irex balance beam  2  x 30 sec ea LE posterior  CGA  NMR Increased difficulty maintaining balance on R LE > L LE.   ?Airex balance beam dual task balance white board game  X 1 round x 5 minutes  CGA NMR Weight shifting to edge of BOS and sidestepping on airex balance beam.   ?      ?      ?      ?      ?Leg press   ?100# x 1 x 15 reps ?115# x 1 x 12 reps  ?130# 1 x 10  reps  therex Pt rates medium -hard overall for this activity  ?Cues for proper foot placement as well as assistance with obtaining proper foot placement. Cues for eccentric control  ?Increased resitacne this session with no adverse effects of knee pain   ?      ?      ?Treatment provided this session ? ? ?Pt educated throughout session about proper posture and technique with exercises. Improved exercise technique, movement at target joints, use of target muscles after min to mod verbal, visual, tactile cues. ? ?Note: Portions of this document were prepared using Dragon voice recognition software and although reviewed may contain unintentional dictation errors in syntax, grammar, or spelling. ? ? ? ? ? ? ? ? ? ? ? ? ? ? ? ? ? ? ? ? ? ? ? ? ? ? ? ? ? PT Short Term Goals - 06/03/21 1213   ? ?  ? PT SHORT TERM GOAL #1  ? Title Patient will be independent in home exercise program to improve strength/mobility for better functional independence with ADLs.   ? Baseline 3/21: HEP given   ? Time 4   ? Period Weeks   ? Status New   ? Target Date 07/01/21   ? ?  ?  ? ?  ? ? ? ? PT Long Term Goals - 06/27/21 1138   ? ?  ? PT LONG TERM GOAL #1  ? Title Patient will increase FOTO score to equal to or greater than  60%   to demonstrate statistically significant improvement in mobility and quality of life.   ? Baseline 3/21: 50%   ? Time 12   ? Period Weeks   ? Status New   ? Target Date 08/26/21   ?  ? PT LONG TERM GOAL #2  ? Title Patient will increase Functional Gait Assessment score to >20/30 as to reduce fall risk and improve dynamic gait safety with community ambulation.   ? Baseline  3/21: 12/30   ? Time 12   ? Period Weeks   ? Status New   ? Target Date 08/26/21   ?  ? PT LONG TERM GOAL #3  ?  Title Patient will increase ABC scale score >80% to demonstrate better functional mobility and better confidence with ADLs.   ? Baseline 3/21: 75%   ? Time 12   ? Period Weeks   ? Status New   ? Target Date 08/26/21   ?  ? PT LONG TERM GOAL #4  ? Title Patient will increase BLE gross strength to 4+/5 as to improve functional strength for independent gait, increased standing tolerance and increased ADL ability.   ? Baseline 3/21: see note   ? Time 12   ? Period Weeks   ? Status New   ? Target Date 08/26/21   ?  ? PT LONG TERM GOAL #5  ? Title Patient will improve 6-minute walk test to greater than or equal to 1500 feet in order to improve to age-adjusted norm and to improve patient's endurance with ambulation   ? Baseline 1225 ft   ? Time 8   ? Period Weeks   ? Status New   ? Target Date 08/22/21   ? ?  ?  ? ?  ? ? ? ? ? ? ? ? Plan - 07/11/21 0922   ? ? Clinical Impression Statement Patient is pleasant demonstrates excellent motivation for completion of physical therapy activities.  Patient continues to demonstrate improved lower extremity strength as evidenced by increased tolerance with increased repetition as well as increased resistance on leg press.  Patient also demonstrated improved gait mechanics as demonstrated by improved gait speed on Biodex treadmill as well as improved single-leg stance time and step length on bilateral lower extremities today.  Patient also demonstrates good balance responses with dual task balance as well as other balance activities performed in session.  Patient continues to have some feelings of imbalance but does feel as though he is improving.  Patient will continue to benefit from skilled physical therapy in order to improve his lower extremity strength, balance, and improve his quality of life.   ? Personal Factors and Comorbidities Age;Comorbidity 3+;Past/Current  Experience;Time since onset of injury/illness/exacerbation   ? Comorbidities Abnormal LFTs, Anxiety, Aortic atherosclerosis ([place]), Arthritis, Basal cell carcinoma (09/14/2010), Bilateral carotid artery steno

## 2021-07-16 ENCOUNTER — Ambulatory Visit: Payer: Medicare Other | Admitting: Physical Therapy

## 2021-07-18 ENCOUNTER — Ambulatory Visit: Payer: Medicare Other | Attending: Internal Medicine | Admitting: Physical Therapy

## 2021-07-18 DIAGNOSIS — R2689 Other abnormalities of gait and mobility: Secondary | ICD-10-CM | POA: Insufficient documentation

## 2021-07-18 DIAGNOSIS — R2681 Unsteadiness on feet: Secondary | ICD-10-CM | POA: Diagnosis not present

## 2021-07-18 DIAGNOSIS — M6281 Muscle weakness (generalized): Secondary | ICD-10-CM | POA: Diagnosis not present

## 2021-07-18 NOTE — Therapy (Signed)
[facility] ?[place], [place], [address] ?Phone: [phone]   Fax:  [phone] ? ?Physical Therapy Treatment/ Progress note/ Discharge Summary  ? ?Dates or reporting period  ?06/05/21-07/18/21 ? ?Patient Details  ?Name: Bobby Murillo ?[name]: [id number] ?Date of Birth: [birth year] ?Referring Provider (PT): Deborra Medina ? ? ?Encounter Date: 07/18/2021 ? ? PT End of Session - 07/18/21 6767   ? ? Visit Number 10   ? Number of Visits 24   ? Date for PT Re-Evaluation 08/26/21   ? Authorization Type 1/10 eval 3/21   ? Progress Note Due on Visit 10   ? PT Start Time [id number]   ? PT Stop Time 0930   ? PT Time Calculation (min) 43 min   ? Equipment Utilized During Treatment Gait belt   ? Activity Tolerance Patient tolerated treatment well   ? Behavior During Therapy [facility] for tasks assessed/performed   ? ?  ?  ? ?  ? ? ?Past Medical History:  ?Diagnosis Date  ? Abnormal LFTs   ? Acute cerebrovascular accident (CVA) due to thrombosis of left middle cerebral artery ([place]) 01/05/2021  ? Anxiety   ? Aortic atherosclerosis ([place])   ? Arthritis   ? Basal cell carcinoma 09/14/2010  ? left calf   ? Bilateral carotid artery stenosis   ? a.) Carotid doppler 70/96/2836: 62-94% LICA; 7-65% RICA. b.) CTA neck 01/30/2021: critical stenosis LEFT carotid bulb.  ? Carotid stenosis, symptomatic, with infarction ([place]) 01/20/2021  ? CKD (chronic kidney disease), stage III ([place])   ? History of kidney stones   ? Hyperlipidemia   ? Hypertension   ? Long term current use of antithrombotics/antiplatelets   ? a.) DAPT therapy (ASA + clopidogrel)  ? Prostate cancer ([place]) 03/16/2008  ? Dr. Ronny Bacon  ? Sinus bradycardia by electrocardiogram   ? Stroke ([place]) 01/03/2021  ? a.) Acute infarction affecting the cortex. ?? incidental punctate acute white matter infarction just posterior to the atrium of the left lateral ventricle. Stenosis of the right M2 branch inferior division. Severe atherosclerotic disease  of both PCAs with severe stenoses.  ? ? ?Past Surgical History:  ?Procedure Laterality Date  ? CATARACT EXTRACTION Bilateral   ? 05/2021  ? COLONOSCOPY    ? ENDARTERECTOMY Left 02/19/2021  ? Procedure: ENDARTERECTOMY CAROTID;  Surgeon: Katha Cabal, MD;  Location: [facility] ORS;  Service: Vascular;  Laterality: Left;  ? PROSTATECTOMY  11/14/2008  ? transurethral, radical, Dr. Darcus Austin  ? ? ?There were no vitals filed for this visit. ? ? Subjective Assessment - 07/18/21 0849   ? ? Subjective Pt reports he has been feeling sick but has been feeling a little better. Pt reports no stumbles but still feels imbalanced. Pt reports playing golf yesterday and palyed lousy and felt pretty tired.  Patient does feel his walking stability has improved with 6-minute walk test however he has some fatigue this session secondary to recent illness.   ? Pertinent History Patient has a past medical history of Abnormal LFTs, Anxiety, Aortic atherosclerosis ([place]), Arthritis, Basal cell carcinoma (09/14/2010), Bilateral carotid artery stenosis, CKD (chronic kidney disease), stage III ([place]), History of kidney stones, Hyperlipidemia, Hypertension, Long term current use of antithrombotics/antiplatelets, Prostate cancer ([place]) (03/16/2008), Sinus bradycardia by electrocardiogram, and Stroke ([place]) (01/03/2021). Balance has been an issue for about a year. Reports they thought it was an inner ear issue, went to ENT and did not see any deficits of the ear. Has some  times of relapse of balance. Has not had a fall but has had many near falls.   ? Limitations Walking   ? How long can you sit comfortably? n/a   ? How long can you stand comfortably? 10 minutes   ? Patient Stated Goals to improve balance, plays golf and tennis   ? ?  ?  ? ?  ? ? ? ? ? [facility] PT Assessment - 07/18/21 0001   ? ?  ? Functional Gait  Assessment  ? Gait assessed  Yes   ? Gait Level Surface Walks 20 ft in less than 5.5 sec, no assistive devices, good speed, no evidence for  imbalance, normal gait pattern, deviates no more than 6 in outside of the 12 in walkway width.   ? Change in Gait Speed Able to smoothly change walking speed without loss of balance or gait deviation. Deviate no more than 6 in outside of the 12 in walkway width.   ? Gait with Horizontal Head Turns Performs head turns smoothly with no change in gait. Deviates no more than 6 in outside 12 in walkway width   ? Gait with Vertical Head Turns Performs head turns with no change in gait. Deviates no more than 6 in outside 12 in walkway width.   ? Gait and Pivot Turn Pivot turns safely within 3 sec and stops quickly with no loss of balance.   ? Step Over Obstacle Is able to step over one shoe box (4.5 in total height) without changing gait speed. No evidence of imbalance.   ? Gait with Narrow Base of Support Ambulates 7-9 steps.   ? Gait with Eyes Closed Walks 20 ft, uses assistive device, slower speed, mild gait deviations, deviates 6-10 in outside 12 in walkway width. Ambulates 20 ft in less than 9 sec but greater than 7 sec.   ? Ambulating Backwards Walks 20 ft, uses assistive device, slower speed, mild gait deviations, deviates 6-10 in outside 12 in walkway width.   ? Steps Alternating feet, must use rail.   ? Total Score 25   ? ?  ?  ? ?  ? ? ? ?Strength: 5/5 with all LE movements ( knee, ankle, hips bilaterally)  ? ?6MWT 1220 feet with improved LE clearance. Pt was xperiencing some fatigue due to recent respiratory illness which may have negatively impacted his score on this test  ? ?FOTO: 35, initial  FOTO system prediction  and patient surpasses this ? ?ABC: 83.75% , initial goal of 80% and patient surpasses this ? ? ?Physical therapy treatment session today consisted of completing assessment of goals and administration of testing as demonstrated in flow sheet. Addition treatments may be found below.  ? ? ? ? ? ? ? ? ? ? ? ? ? ? ? ? ? ? ? PT Education - 07/18/21 0851   ? ? Education Details Discharge instructions and  progression of home exercise program   ? Person(s) Educated Patient   ? Methods Explanation   ? Comprehension Verbalized understanding   ? ?  ?  ? ?  ? ? ? PT Short Term Goals - 07/18/21 0852   ? ?  ? PT SHORT TERM GOAL #1  ? Title Patient will be independent in home exercise program to improve strength/mobility for better functional independence with ADLs.   ? Baseline 3/21: HEP 5/5: Pt reports completing regularly outside of being sick at this time.   ? Time 4   ? Period Weeks   ?  Status New   ? Target Date 07/01/21   ? ?  ?  ? ?  ? ? ? ? PT Long Term Goals - 07/18/21 0928   ? ?  ? PT LONG TERM GOAL #1  ? Title Patient will increase FOTO score to equal to or greater than  60%   to demonstrate statistically significant improvement in mobility and quality of life.   ? Baseline 3/21: 50%   ? Time 12   ? Period Weeks   ? Status New   ? Target Date 08/26/21   ?  ? PT LONG TERM GOAL #2  ? Title Patient will increase Functional Gait Assessment score to >20/30 as to reduce fall risk and improve dynamic gait safety with community ambulation.   ? Baseline 3/21: 12/30   ? Time 12   ? Period Weeks   ? Status New   ? Target Date 08/26/21   ?  ? PT LONG TERM GOAL #3  ? Title Patient will increase ABC scale score >80% to demonstrate better functional mobility and better confidence with ADLs.   ? Baseline 3/21: 75%   ? Time 12   ? Period Weeks   ? Status New   ? Target Date 08/26/21   ?  ? PT LONG TERM GOAL #4  ? Title Patient will increase BLE gross strength to 4+/5 as to improve functional strength for independent gait, increased standing tolerance and increased ADL ability.   ? Baseline 3/21: see note   ? Time 12   ? Period Weeks   ? Status New   ? Target Date 08/26/21   ?  ? PT LONG TERM GOAL #5  ? Title Patient will improve 6-minute walk test to greater than or equal to 1500 feet in order to improve to age-adjusted norm and to improve patient's endurance with ambulation   ? Baseline 1225 ft   ? Time 8   ? Period Weeks   ?  Status New   ? Target Date 08/22/21   ? ?  ?  ? ?  ? ? ? ? ? ? ? ? Plan - 07/18/21 0852   ? ? Clinical Impression Statement/ Discharge Summary  Patient is pleasant and demonstrates excellent motivation for c

## 2021-07-23 ENCOUNTER — Ambulatory Visit: Payer: Medicare Other | Admitting: Physical Therapy

## 2021-07-25 ENCOUNTER — Other Ambulatory Visit (INDEPENDENT_AMBULATORY_CARE_PROVIDER_SITE_OTHER): Payer: Self-pay | Admitting: Vascular Surgery

## 2021-07-25 ENCOUNTER — Ambulatory Visit: Payer: Medicare Other | Admitting: Physical Therapy

## 2021-07-25 DIAGNOSIS — I6523 Occlusion and stenosis of bilateral carotid arteries: Secondary | ICD-10-CM

## 2021-07-28 ENCOUNTER — Encounter (INDEPENDENT_AMBULATORY_CARE_PROVIDER_SITE_OTHER): Payer: Self-pay | Admitting: Nurse Practitioner

## 2021-07-28 ENCOUNTER — Ambulatory Visit (INDEPENDENT_AMBULATORY_CARE_PROVIDER_SITE_OTHER): Payer: Medicare Other | Admitting: Nurse Practitioner

## 2021-07-28 ENCOUNTER — Ambulatory Visit (INDEPENDENT_AMBULATORY_CARE_PROVIDER_SITE_OTHER): Payer: Medicare Other

## 2021-07-28 VITALS — BP 137/75 | HR 73 | Resp 16 | Wt 210.4 lb

## 2021-07-28 DIAGNOSIS — I10 Essential (primary) hypertension: Secondary | ICD-10-CM | POA: Diagnosis not present

## 2021-07-28 DIAGNOSIS — I63239 Cerebral infarction due to unspecified occlusion or stenosis of unspecified carotid arteries: Secondary | ICD-10-CM | POA: Diagnosis not present

## 2021-07-28 DIAGNOSIS — E785 Hyperlipidemia, unspecified: Secondary | ICD-10-CM | POA: Diagnosis not present

## 2021-07-28 DIAGNOSIS — I6523 Occlusion and stenosis of bilateral carotid arteries: Secondary | ICD-10-CM

## 2021-07-30 ENCOUNTER — Ambulatory Visit: Payer: Medicare Other | Admitting: Physical Therapy

## 2021-08-01 ENCOUNTER — Ambulatory Visit: Payer: Medicare Other | Admitting: Physical Therapy

## 2021-08-04 ENCOUNTER — Encounter (INDEPENDENT_AMBULATORY_CARE_PROVIDER_SITE_OTHER): Payer: Self-pay | Admitting: Nurse Practitioner

## 2021-08-04 NOTE — Progress Notes (Signed)
Subjective:    Patient ID: Bobby Murillo, male    DOB: [birth year], 81 y.o.   [name]: [id number] Chief Complaint  Patient presents with   Follow-up    Ultrasound follow up    The patient is seen for follow up evaluation of carotid stenosis status post left carotid endarterectomy on 02/19/2021.  There were no post operative problems or complications related to the surgery.  The patient denies neck or incisional pain.  The patient denies interval amaurosis fugax. There is no recent history of TIA symptoms or focal motor deficits. There is no prior documented CVA.  The patient denies headache.  The patient is taking enteric-coated aspirin 81 mg daily.  No recent shortening of the patient's walking distance or new symptoms consistent with claudication.  No history of rest pain symptoms. No new ulcers or wounds of the lower extremities have occurred.  There is no history of DVT, PE or superficial thrombophlebitis. No recent episodes of angina or shortness of breath documented.     Duplex ultrasound preoperatively shows 1 to 39% right ICA stenosis with 40 to 59% left ICA stenosis.  The elevated velocities may just be due to some postoperative narrowing versus hyperplasia.   Review of Systems  All other systems reviewed and are negative.     Objective:   Physical Exam Vitals reviewed.  HENT:     Head: Normocephalic.  Neck:     Vascular: No carotid bruit.  Cardiovascular:     Rate and Rhythm: Normal rate and regular rhythm.     Pulses: Normal pulses.  Pulmonary:     Effort: Pulmonary effort is normal.  Skin:    General: Skin is warm and dry.  Neurological:     Mental Status: He is alert and oriented to person, place, and time.  Psychiatric:        Mood and Affect: Mood normal.        Behavior: Behavior normal.        Thought Content: Thought content normal.        Judgment: Judgment normal.    BP 137/75 (BP Location: Right Arm)   Pulse 73   Resp 16   Wt 210 lb 6.4 oz  (95.4 kg)   BMI 28.94 kg/m   Past Medical History:  Diagnosis Date   Abnormal LFTs    Acute cerebrovascular accident (CVA) due to thrombosis of left middle cerebral artery ([place]) 01/05/2021   Anxiety    Aortic atherosclerosis ([facility])    Arthritis    Basal cell carcinoma 09/14/2010   left calf    Bilateral carotid artery stenosis    a.) Carotid doppler 58/52/7782: 42-35% LICA; 3-61% RICA. b.) CTA neck 01/30/2021: critical stenosis LEFT carotid bulb.   Carotid stenosis, symptomatic, with infarction ([place]) 01/20/2021   CKD (chronic kidney disease), stage III ([place])    History of kidney stones    Hyperlipidemia    Hypertension    Long term current use of antithrombotics/antiplatelets    a.) DAPT therapy (ASA + clopidogrel)   Prostate cancer ([place]) 03/16/2008   Dr. Ronny Bacon   Sinus bradycardia by electrocardiogram    Stroke ([place]) 01/03/2021   a.) Acute infarction affecting the cortex. ?? incidental punctate acute white matter infarction just posterior to the atrium of the left lateral ventricle. Stenosis of the right M2 branch inferior division. Severe atherosclerotic disease of both PCAs with severe stenoses.    Social History   Socioeconomic History   Marital status: Married  Spouse name: [name]   Number of children: Not on file   Years of education: Not on file   Highest education level: Not on file  Occupational History   Not on file  Tobacco Use   Smoking status: Never   Smokeless tobacco: Never  Vaping Use   Vaping Use: Never used  Substance and Sexual Activity   Alcohol use: Yes    Alcohol/week: 8.0 standard drinks    Types: 4 Glasses of wine, 4 Shots of liquor per week    Comment: occasional   Drug use: No   Sexual activity: Yes  Other Topics Concern   Not on file  Social History Narrative   Patient lives in [place] with his wife.  Non-smoker.  Drinks over the weekend.  Retired from [employer].      Right handed   Social Determinants of Health   Financial Resource  Strain: Low Risk    Difficulty of Paying Living Expenses: Not hard at all  Food Insecurity: No Food Insecurity   Worried About Charity fundraiser in the Last Year: Never true   Ran Out of Food in the Last Year: Never true  Transportation Needs: No Transportation Needs   Lack of Transportation (Medical): No   Lack of Transportation (Non-Medical): No  Physical Activity: Insufficiently Active   Days of Exercise per Week: 1 day   Minutes of Exercise per Session: 90 min  Stress: No Stress Concern Present   Feeling of Stress : Not at all  Social Connections: Unknown   Frequency of Communication with Friends and Family: More than three times a week   Frequency of Social Gatherings with Friends and Family: Once a week   Attends Religious Services: Not on Electrical engineer or Organizations: Yes   Attends Archivist Meetings: Not on file   Marital Status: Married  Human resources officer Violence: Not At Risk   Fear of Current or Ex-Partner: No   Emotionally Abused: No   Physically Abused: No   Sexually Abused: No    Past Surgical History:  Procedure Laterality Date   CATARACT EXTRACTION Bilateral    05/2021   COLONOSCOPY     ENDARTERECTOMY Left 02/19/2021   Procedure: ENDARTERECTOMY CAROTID;  Surgeon: Katha Cabal, MD;  Location: [facility] ORS;  Service: Vascular;  Laterality: Left;   PROSTATECTOMY  11/14/2008   transurethral, radical, Dr. Darcus Austin    Family History  Problem Relation Age of Onset   Alzheimer's disease Mother    Stroke Father    Prostate cancer Father    Alzheimer's disease Sister    Stroke Paternal Aunt    Heart disease Paternal Uncle 15    Allergies  Allergen Reactions   Alfuzosin Other (See Comments)    Other Reaction: fainting spell   Pravastatin Sodium     Other reaction(s): Muscle Pain       Latest Ref Rng & Units 05/27/2021    8:51 AM 02/22/2021    8:39 PM 02/20/2021    5:58 AM  CBC  WBC 4.0 - 10.5 K/uL 5.5   8.3   15.0     Hemoglobin 13.0 - 17.0 g/dL 17.3   14.4   14.4    Hematocrit 39.0 - 52.0 % 51.5   42.7   42.3    Platelets 150.0 - 400.0 K/uL 208.0   232   224        CMP     Component Value Date/Time  NA 140 05/27/2021 0851   NA 140 10/23/2011 1142   K 4.2 05/27/2021 0851   CL 107 05/27/2021 0851   CO2 26 05/27/2021 0851   GLUCOSE 100 (H) 05/27/2021 0851   BUN 25 (H) 05/27/2021 0851   BUN 20 10/23/2011 1142   CREATININE 1.42 05/27/2021 0851   CREATININE 1.26 (H) 12/22/2019 1508   CALCIUM 9.6 05/27/2021 0851   PROT 6.2 05/27/2021 0851   PROT 6.6 10/23/2011 1142   ALBUMIN 4.0 05/27/2021 0851   ALBUMIN 4.3 10/23/2011 1142   AST 25 05/27/2021 0851   ALT 30 05/27/2021 0851   ALKPHOS 94 05/27/2021 0851   BILITOT 1.4 (H) 05/27/2021 0851   GFRNONAA 52 (L) 02/22/2021 2039   GFRAA 69 10/23/2011 1142     No results found.     Assessment & Plan:   1. Carotid stenosis, symptomatic, with infarction [facility]) Recommend:  The patient is s/p successful left CEA  Duplex ultrasound preoperatively shows 1 to 39% right ICA stenosis with 40 to 59% left ICA stenosis.  The elevated velocities may just be due to some postoperative narrowing versus hyperplasia.  Continue antiplatelet therapy as prescribed Continue management of CAD, HTN and Hyperlipidemia Healthy heart diet,  encouraged exercise at least 4 times per week  Follow up in 3 months with duplex ultrasound and physical exam based on the patient's carotid surgery   2. Essential hypertension Continue antihypertensive medications as already ordered, these medications have been reviewed and there are no changes at this time.   3. Hyperlipidemia LDL goal <100 Patient will continue with Zetia and Praluent, as dictated by cardiology, which has been very helpful for lowering his cholesterol levels.   Current Outpatient Medications on File Prior to Visit  Medication Sig Dispense Refill   acetaminophen (TYLENOL) 325 MG tablet Take 650 mg by mouth  every 6 (six) hours as needed for moderate pain.     Alirocumab (PRALUENT) 150 MG/ML SOAJ Inject 150 mg into the skin every 14 (fourteen) days. 2 mL 6   ALPRAZolam (XANAX) 0.5 MG tablet TAKE 1 TABLET BY MOUTH AT BEDTIME AS NEEDED FOR SLEEP 30 tablet 5   aspirin 325 MG tablet Take 325 mg by mouth daily.     ezetimibe (ZETIA) 10 MG tablet Take 1 tablet by mouth once daily 90 tablet 0   fluticasone (FLONASE) 50 MCG/ACT nasal spray Place 2 sprays into both nostrils daily. 16 g 6   Glucosamine-Chondroitin (GLUCOSAMINE CHONDR COMPLEX PO) Take 1 tablet by mouth daily. tab by mouth daily     losartan (COZAAR) 25 MG tablet Take 1 tablet (25 mg total) by mouth daily. 90 tablet 1   polycarbophil (FIBERCON) 625 MG tablet Take 625 mg by mouth daily.     No current facility-administered medications on file prior to visit.    There are no Patient Instructions on file for this visit. No follow-ups on file.   Kris Hartmann, NP

## 2021-08-05 ENCOUNTER — Other Ambulatory Visit: Payer: Self-pay | Admitting: Internal Medicine

## 2021-08-05 DIAGNOSIS — E785 Hyperlipidemia, unspecified: Secondary | ICD-10-CM

## 2021-08-05 NOTE — Assessment & Plan Note (Signed)
Praluent started,  Atorvastatin stoppedin April   Lab Results  Component Value Date   CHOL 102 05/27/2021   HDL 61.40 05/27/2021   LDLCALC 21 05/27/2021   LDLDIRECT 184.0 12/30/2015   TRIG 100.0 05/27/2021   CHOLHDL 2 05/27/2021

## 2021-08-05 NOTE — Telephone Encounter (Signed)
Looks like this medication was discontinued by a different provider in April.

## 2021-08-05 NOTE — Telephone Encounter (Signed)
If he is taking praluent injections he does not need  atorvastatin .  Refill denied.

## 2021-08-06 ENCOUNTER — Ambulatory Visit: Payer: Medicare Other | Admitting: Physical Therapy

## 2021-08-08 ENCOUNTER — Ambulatory Visit: Payer: Medicare Other | Admitting: Physical Therapy

## 2021-08-13 ENCOUNTER — Ambulatory Visit: Payer: Medicare Other | Admitting: Physical Therapy

## 2021-08-15 ENCOUNTER — Ambulatory Visit: Payer: Medicare Other | Admitting: Physical Therapy

## 2021-08-16 ENCOUNTER — Other Ambulatory Visit: Payer: Self-pay | Admitting: Internal Medicine

## 2021-08-16 ENCOUNTER — Encounter: Payer: Self-pay | Admitting: Internal Medicine

## 2021-08-18 ENCOUNTER — Telehealth: Payer: Self-pay

## 2021-08-18 ENCOUNTER — Other Ambulatory Visit: Payer: Self-pay

## 2021-08-18 ENCOUNTER — Ambulatory Visit: Payer: Medicare Other | Admitting: Physical Therapy

## 2021-08-18 NOTE — Telephone Encounter (Signed)
Close  

## 2021-08-22 ENCOUNTER — Ambulatory Visit: Payer: Medicare Other | Admitting: Physical Therapy

## 2021-08-25 DIAGNOSIS — C4441 Basal cell carcinoma of skin of scalp and neck: Secondary | ICD-10-CM | POA: Diagnosis not present

## 2021-08-28 ENCOUNTER — Ambulatory Visit: Payer: Medicare Other | Admitting: Physical Therapy

## 2021-09-02 DIAGNOSIS — D2262 Melanocytic nevi of left upper limb, including shoulder: Secondary | ICD-10-CM | POA: Diagnosis not present

## 2021-09-02 DIAGNOSIS — L570 Actinic keratosis: Secondary | ICD-10-CM | POA: Diagnosis not present

## 2021-09-02 DIAGNOSIS — C44329 Squamous cell carcinoma of skin of other parts of face: Secondary | ICD-10-CM | POA: Diagnosis not present

## 2021-09-02 DIAGNOSIS — Z85828 Personal history of other malignant neoplasm of skin: Secondary | ICD-10-CM | POA: Diagnosis not present

## 2021-09-02 DIAGNOSIS — D2271 Melanocytic nevi of right lower limb, including hip: Secondary | ICD-10-CM | POA: Diagnosis not present

## 2021-09-02 DIAGNOSIS — D485 Neoplasm of uncertain behavior of skin: Secondary | ICD-10-CM | POA: Diagnosis not present

## 2021-09-02 DIAGNOSIS — D2272 Melanocytic nevi of left lower limb, including hip: Secondary | ICD-10-CM | POA: Diagnosis not present

## 2021-09-02 DIAGNOSIS — X32XXXA Exposure to sunlight, initial encounter: Secondary | ICD-10-CM | POA: Diagnosis not present

## 2021-09-02 DIAGNOSIS — C44719 Basal cell carcinoma of skin of left lower limb, including hip: Secondary | ICD-10-CM | POA: Diagnosis not present

## 2021-09-02 DIAGNOSIS — D0471 Carcinoma in situ of skin of right lower limb, including hip: Secondary | ICD-10-CM | POA: Diagnosis not present

## 2021-09-04 ENCOUNTER — Ambulatory Visit: Payer: Medicare Other | Admitting: Physical Therapy

## 2021-09-09 ENCOUNTER — Ambulatory Visit: Payer: Medicare Other | Admitting: Physical Therapy

## 2021-09-10 ENCOUNTER — Ambulatory Visit: Payer: Medicare Other | Admitting: Neurology

## 2021-09-10 DIAGNOSIS — C44329 Squamous cell carcinoma of skin of other parts of face: Secondary | ICD-10-CM | POA: Diagnosis not present

## 2021-09-11 ENCOUNTER — Ambulatory Visit: Payer: Medicare Other | Admitting: Physical Therapy

## 2021-09-15 ENCOUNTER — Ambulatory Visit (INDEPENDENT_AMBULATORY_CARE_PROVIDER_SITE_OTHER): Payer: Medicare Other | Admitting: Internal Medicine

## 2021-09-15 ENCOUNTER — Encounter: Payer: Self-pay | Admitting: Internal Medicine

## 2021-09-15 VITALS — BP 162/76 | HR 45 | Temp 97.3°F | Ht 71.5 in | Wt 206.8 lb

## 2021-09-15 DIAGNOSIS — E782 Mixed hyperlipidemia: Secondary | ICD-10-CM | POA: Diagnosis not present

## 2021-09-15 DIAGNOSIS — R0683 Snoring: Secondary | ICD-10-CM | POA: Diagnosis not present

## 2021-09-15 DIAGNOSIS — D751 Secondary polycythemia: Secondary | ICD-10-CM

## 2021-09-15 DIAGNOSIS — I10 Essential (primary) hypertension: Secondary | ICD-10-CM

## 2021-09-15 DIAGNOSIS — I6522 Occlusion and stenosis of left carotid artery: Secondary | ICD-10-CM | POA: Diagnosis not present

## 2021-09-15 DIAGNOSIS — Z8673 Personal history of transient ischemic attack (TIA), and cerebral infarction without residual deficits: Secondary | ICD-10-CM

## 2021-09-15 DIAGNOSIS — N1831 Chronic kidney disease, stage 3a: Secondary | ICD-10-CM | POA: Diagnosis not present

## 2021-09-15 LAB — COMPREHENSIVE METABOLIC PANEL
ALT: 28 U/L (ref 0–53)
AST: 22 U/L (ref 0–37)
Albumin: 4.1 g/dL (ref 3.5–5.2)
Alkaline Phosphatase: 94 U/L (ref 39–117)
BUN: 23 mg/dL (ref 6–23)
CO2: 23 mEq/L (ref 19–32)
Calcium: 9.4 mg/dL (ref 8.4–10.5)
Chloride: 106 mEq/L (ref 96–112)
Creatinine, Ser: 1.26 mg/dL (ref 0.40–1.50)
GFR: 54.34 mL/min — ABNORMAL LOW (ref >60.00)
Glucose, Bld: 94 mg/dL (ref 70–99)
Potassium: 4.4 mEq/L (ref 3.5–5.1)
Sodium: 138 mEq/L (ref 135–145)
Total Bilirubin: 1.1 mg/dL (ref 0.2–1.2)
Total Protein: 6.5 g/dL (ref 6.0–8.3)

## 2021-09-15 LAB — LIPID PANEL
Cholesterol: 118 mg/dL (ref 0–200)
HDL: 60 mg/dL (ref >39.00)
LDL Cholesterol: 39 mg/dL (ref 0–99)
NonHDL: 57.66
Total CHOL/HDL Ratio: 2
Triglycerides: 93 mg/dL (ref 0.0–149.0)
VLDL: 18.6 mg/dL (ref 0.0–40.0)

## 2021-09-15 LAB — CBC WITH DIFFERENTIAL/PLATELET
Basophils Absolute: 0.1 10*3/uL (ref 0.0–0.1)
Basophils Relative: 0.9 % (ref 0.0–3.0)
Eosinophils Absolute: 0.2 10*3/uL (ref 0.0–0.7)
Eosinophils Relative: 3.3 % (ref 0.0–5.0)
HCT: 51.9 % (ref 39.0–52.0)
Hemoglobin: 17.4 g/dL — ABNORMAL HIGH (ref 13.0–17.0)
Lymphocytes Relative: 15.8 % (ref 12.0–46.0)
Lymphs Abs: 0.9 10*3/uL (ref 0.7–4.0)
MCHC: 33.6 g/dL (ref 30.0–36.0)
MCV: 91.4 fl (ref 78.0–100.0)
Monocytes Absolute: 0.7 10*3/uL (ref 0.1–1.0)
Monocytes Relative: 12.1 % — ABNORMAL HIGH (ref 3.0–12.0)
Neutro Abs: 4.1 10*3/uL (ref 1.4–7.7)
Neutrophils Relative %: 67.9 % (ref 43.0–77.0)
Platelets: 245 10*3/uL (ref 150.0–400.0)
RBC: 5.68 Mil/uL (ref 4.22–5.81)
RDW: 14.3 % (ref 11.5–15.5)
WBC: 6 10*3/uL (ref 4.0–10.5)

## 2021-09-15 NOTE — Assessment & Plan Note (Signed)
No prior nephrology evaluation.  Recommending referral if GFR has dropped below 45 ml/min on today's labs.

## 2021-09-15 NOTE — Assessment & Plan Note (Signed)
Likely due to untreated OSA per hematology.  Recommend he reconsider sleep study if hgb is > 17 today  Lab Results  Component Value Date   WBC 5.5 05/27/2021   HGB 17.3 (H) 05/27/2021   HCT 51.5 05/27/2021   MCV 90.2 05/27/2021   PLT 208.0 05/27/2021

## 2021-09-15 NOTE — Assessment & Plan Note (Addendum)
S/p CEA   Of left carotid Dec 2022 .  Continue q 3 month surveillance with carotid dopplers by [facility]. He continues to have an audible bruit on the left.

## 2021-09-15 NOTE — Assessment & Plan Note (Signed)
Home readings have been labile and some elevated.  Needs RN visit to check accuracy od home machine.  Continue 25 mg losarstan daily  for now and sleep study finally agreed to

## 2021-09-15 NOTE — Progress Notes (Unsigned)
Subjective:  Patient ID: GLENDELL FOUSE, male    DOB: Aug 22, 1940  Age: 81 y.o. [name]: [id number]  CC: There were no encounter diagnoses.   HPI ERNIE KASLER presents for  follow up on hypertension and other chronic issues  Chief Complaint  Patient presents with   Follow-up    Follow up on hypertension  81 yr old male with CKD hypertension, aortic atherosclerosis , recent L MCA territory stroke,  presents for follow up on chronic issues )    1) HTN: dose of losartan was reduced to 25 mg by cardiology after his syncopal event (s/p  carotid endarterectomy in Dec,  complicated by syncopal event ) ,  seen one month ago  by [facility],  reading was normal,  however home check yesterday was initially elevated to 086 systolic but improved with deep breathing and relaxation ; repeat reading was done several minutes later and was 128/80.   Previous intolernce to amlodipine due to ankle edema    2)  Vertigo:  secondary to acute L MCA infarct.  Improved,  has finished PT .  Playing 8 holes of golf  twice per week .  Doing a few exercises at home.  No longer playing tennis, but tried pickle ball   3) Carotid stenosis: s/p endovascular CEA  with vascular follow up in May and q 3 months.  Tolerating  zetia and praluent  4) Derm: has had several excisions  by [facility]    5) cataract surgery  bilateral Feb./March 2023  Outpatient Medications Prior to Visit  Medication Sig Dispense Refill   acetaminophen (TYLENOL) 325 MG tablet Take 650 mg by mouth every 6 (six) hours as needed for moderate pain.     Alirocumab (PRALUENT) 150 MG/ML SOAJ Inject 150 mg into the skin every 14 (fourteen) days. 2 mL 6   ALPRAZolam (XANAX) 0.5 MG tablet TAKE 1 TABLET BY MOUTH AT BEDTIME AS NEEDED FOR SLEEP 30 tablet 5   aspirin 325 MG tablet Take 325 mg by mouth daily.     ezetimibe (ZETIA) 10 MG tablet Take 1 tablet by mouth once daily 90 tablet 0   fluticasone (FLONASE) 50 MCG/ACT nasal spray Place 2 sprays into both nostrils  daily. 16 g 6   Glucosamine-Chondroitin (GLUCOSAMINE CHONDR COMPLEX PO) Take 1 tablet by mouth daily. tab by mouth daily     polycarbophil (FIBERCON) 625 MG tablet Take 625 mg by mouth daily.     losartan (COZAAR) 25 MG tablet Take 1 tablet (25 mg total) by mouth daily. 90 tablet 1   No facility-administered medications prior to visit.    Review of Systems;  Patient denies headache, fevers, malaise, unintentional weight loss, skin rash, eye pain, sinus congestion and sinus pain, sore throat, dysphagia,  hemoptysis , cough, dyspnea, wheezing, chest pain, palpitations, orthopnea, edema, abdominal pain, nausea, melena, diarrhea, constipation, flank pain, dysuria, hematuria, urinary  Frequency, nocturia, numbness, tingling, seizures,  Focal weakness, Loss of consciousness,  Tremor, insomnia, depression, anxiety, and suicidal ideation.      Objective:  BP (!) 162/76 (BP Location: Left Arm, Patient Position: Sitting, Cuff Size: Normal)   Pulse (!) 45   Temp (!) 97.3 F (36.3 C) (Oral)   Ht 5' 11.5" (1.816 m)   Wt 206 lb 12.8 oz (93.8 kg)   SpO2 96%   BMI 28.44 kg/m   BP Readings from Last 3 Encounters:  09/15/21 (!) 162/76  07/28/21 137/75  06/24/21 139/87    Wt Readings from Last  3 Encounters:  09/15/21 206 lb 12.8 oz (93.8 kg)  07/28/21 210 lb 6.4 oz (95.4 kg)  06/16/21 213 lb (96.6 kg)    General appearance: alert, cooperative and appears stated age Ears: normal TM's and external ear canals both ears Throat: lips, mucosa, and tongue normal; teeth and gums normal Neck: no adenopathy, no carotid bruit, supple, symmetrical, trachea midline and thyroid not enlarged, symmetric, no tenderness/mass/nodules Back: symmetric, no curvature. ROM normal. No CVA tenderness. Lungs: clear to auscultation bilaterally Heart: regular rate and rhythm, S1, S2 normal, no murmur, click, rub or gallop Abdomen: soft, non-tender; bowel sounds normal; no masses,  no organomegaly Pulses: 2+ and  symmetric Skin: Skin color, texture, turgor normal. No rashes or lesions Lymph nodes: Cervical, supraclavicular, and axillary nodes normal.  Lab Results  Component Value Date   HGBA1C 6.2 05/27/2021   HGBA1C 6.0 (H) 01/03/2021   HGBA1C 5.8 06/09/2019    Lab Results  Component Value Date   CREATININE 1.42 05/27/2021   CREATININE 1.28 03/04/2021   CREATININE 1.38 (H) 02/22/2021    Lab Results  Component Value Date   WBC 5.5 05/27/2021   HGB 17.3 (H) 05/27/2021   HCT 51.5 05/27/2021   PLT 208.0 05/27/2021   GLUCOSE 100 (H) 05/27/2021   CHOL 102 05/27/2021   TRIG 100.0 05/27/2021   HDL 61.40 05/27/2021   LDLDIRECT 184.0 12/30/2015   LDLCALC 21 05/27/2021   ALT 30 05/27/2021   AST 25 05/27/2021   NA 140 05/27/2021   K 4.2 05/27/2021   CL 107 05/27/2021   CREATININE 1.42 05/27/2021   BUN 25 (H) 05/27/2021   CO2 26 05/27/2021   TSH 4.23 05/27/2021   PSA 0.00 (L) 05/27/2021   INR 1.0 02/22/2021   HGBA1C 6.2 05/27/2021   MICROALBUR <0.7 05/27/2021    CT Angio Neck W and/or Wo Contrast  Result Date: 02/22/2021 CLINICAL DATA:  Initial evaluation for pulsatile neck mass, recent CEA. EXAM: CT ANGIOGRAPHY NECK TECHNIQUE: Multidetector CT imaging of the neck was performed using the standard protocol during bolus administration of intravenous contrast. Multiplanar CT image reconstructions and MIPs were obtained to evaluate the vascular anatomy. Carotid stenosis measurements (when applicable) are obtained utilizing NASCET criteria, using the distal internal carotid diameter as the denominator. CONTRAST:  30m OMNIPAQUE IOHEXOL 350 MG/ML SOLN COMPARISON:  Prior study from 01/31/2011. FINDINGS: Aortic arch: Visualized aortic arch normal in caliber with normal 3 vessel morphology. No stenosis or other abnormality about the origin of the great vessels. Right carotid system: Right common carotid artery patent from its origin to the bifurcation without stenosis. Minimal plaque about the  right carotid bulb without stenosis. Right ICA patent distally without stenosis or dissection. Left carotid system: Left CCA widely patent proximally. Postoperative changes from recent carotid endarterectomy seen about the left carotid bifurcation. Diffuse soft tissue density seen within the left carotid sheath, surrounding the distal left common and proximal left internal carotid arteries, likely postoperative. Few scattered foci of postoperative emphysema noted as well. No active contrast extravasation or pseudoaneurysm formation. No raised dissection flap or other complication. Residual stenosis of up to approximately 45% seen at the origin of the left ICA (series 8, image 120). Left ICA otherwise patent distally to the skull base without stenosis or dissection. Left external carotid artery and its branches remain patent and well perfused. Vertebral arteries: Both vertebral arteries arise from the subclavian arteries. Vertebral arteries widely patent without stenosis or dissection. Skeleton: No visible acute osseous finding. No discrete or  worrisome osseous lesions. Multilevel cervical spondylosis without high-grade spinal stenosis. Other neck: Diffuse soft tissue density and stranding seen surrounding the left carotid artery and left carotid sheath, most pronounced at the level of the bifurcation, likely reflecting postoperative changes. Suspected small amount of blood products within this region. Few scattered foci of soft tissue emphysema. No active contrast extravasation. Postoperative thickening and swelling seen within the overlying left sternocleidomastoid muscle and adjacent soft tissues. No frank hematoma formation. No other acute soft tissue abnormality within the neck. Upper chest: Visualized upper chest demonstrates no other acute finding. IMPRESSION: 1. Postoperative changes from recent left carotid endarterectomy. Diffuse soft tissue density and stranding surrounding the left carotid artery and left  carotid sheath likely reflects postoperative changes. No active contrast extravasation, pseudoaneurysm formation, or frank hematoma formation. Previously seen left ICA stenosis is markedly improved, with no hemodynamically significant greater than 50% stenosis now seen. 2. Otherwise negative CTA of the neck. Electronically Signed   By: Jeannine Boga M.D.   On: 02/22/2021 22:57    Assessment & Plan:   Problem List Items Addressed This Visit   None   I spent a total of   minutes with this patient in a face to face visit on the date of this encounter reviewing the last office visit with me on        ,  most recent with patient's cardiologist in    ,  patient'ss diet and eating habits, home blood pressure readings ,  most recent imaging study ,   and post visit ordering of testing and therapeutics.    Follow-up: No follow-ups on file.   Crecencio Mc, MD

## 2021-09-15 NOTE — Patient Instructions (Addendum)
1) Your blood pressure is elevated today The goal  for optimal blood pressure management are 130/80 but not  above 140/90)   Please check your blood pressure   for 5 days at home and send me the readings so I can determine if you need a change in medication    Make sure the conditions are right!   You should be:  Sitting , leg uncrossed,  arm at chest height ( on a  table)  Sitting  for at least  5 minutes  not watching the news,    ok to have Tess present if she is calm    Labs today   Sleep study advised and   referral in progress    Nephrology referral may be advised depending on your kidney function today

## 2021-09-17 DIAGNOSIS — C44719 Basal cell carcinoma of skin of left lower limb, including hip: Secondary | ICD-10-CM | POA: Diagnosis not present

## 2021-09-18 ENCOUNTER — Ambulatory Visit: Payer: Medicare Other | Admitting: Physical Therapy

## 2021-09-23 ENCOUNTER — Ambulatory Visit: Payer: Medicare Other | Admitting: Physical Therapy

## 2021-09-25 ENCOUNTER — Ambulatory Visit: Payer: Medicare Other | Admitting: Physical Therapy

## 2021-09-30 ENCOUNTER — Ambulatory Visit: Payer: Medicare Other | Admitting: Physical Therapy

## 2021-10-02 ENCOUNTER — Ambulatory Visit: Payer: Medicare Other | Admitting: Physical Therapy

## 2021-10-03 ENCOUNTER — Telehealth: Payer: Self-pay | Admitting: Internal Medicine

## 2021-10-03 NOTE — Telephone Encounter (Signed)
Completed and faxed.

## 2021-10-03 NOTE — Telephone Encounter (Signed)
Lisa from [facility] is missing patient demographics and sign order Fax [phone]

## 2021-10-05 ENCOUNTER — Other Ambulatory Visit: Payer: Self-pay | Admitting: Internal Medicine

## 2021-10-06 ENCOUNTER — Ambulatory Visit: Payer: Medicare Other | Admitting: Physical Therapy

## 2021-10-10 ENCOUNTER — Ambulatory Visit: Payer: Medicare Other | Admitting: Physical Therapy

## 2021-10-15 ENCOUNTER — Ambulatory Visit: Payer: Medicare Other | Admitting: Physical Therapy

## 2021-10-17 ENCOUNTER — Ambulatory Visit: Payer: Medicare Other | Admitting: Physical Therapy

## 2021-10-19 ENCOUNTER — Encounter: Payer: Self-pay | Admitting: Internal Medicine

## 2021-10-22 ENCOUNTER — Ambulatory Visit: Payer: Medicare Other | Admitting: Physical Therapy

## 2021-10-24 ENCOUNTER — Ambulatory Visit: Payer: Medicare Other | Admitting: Physical Therapy

## 2021-10-29 ENCOUNTER — Ambulatory Visit: Payer: Medicare Other | Admitting: Physical Therapy

## 2021-10-31 ENCOUNTER — Ambulatory Visit: Payer: Medicare Other | Admitting: Physical Therapy

## 2021-10-31 ENCOUNTER — Other Ambulatory Visit (INDEPENDENT_AMBULATORY_CARE_PROVIDER_SITE_OTHER): Payer: Self-pay | Admitting: Nurse Practitioner

## 2021-10-31 DIAGNOSIS — I63239 Cerebral infarction due to unspecified occlusion or stenosis of unspecified carotid arteries: Secondary | ICD-10-CM

## 2021-11-02 NOTE — Progress Notes (Unsigned)
[name] : [id number]  Bobby Murillo is a 81 y.o. (11/19/1940) male who presents with chief complaint of check carotid arteries.  History of Present Illness:   The patient is seen for follow up evaluation of carotid stenosis status post left carotid endarterectomy on 02/19/2021.  There were no post operative problems or complications related to the surgery.  The patient denies neck or incisional pain.   The patient denies interval amaurosis fugax. There is no recent history of TIA symptoms or focal motor deficits. There is no prior documented CVA.   The patient denies headache.   The patient is taking enteric-coated aspirin 81 mg daily.   No recent shortening of the patient's walking distance or new symptoms consistent with claudication.  No history of rest pain symptoms. No new ulcers or wounds of the lower extremities have occurred.   There is no history of DVT, PE or superficial thrombophlebitis. No recent episodes of angina or shortness of breath documented.   No outpatient medications have been marked as taking for the 11/03/21 encounter (Appointment) with Delana Meyer, Dolores Lory, MD.    Past Medical History:  Diagnosis Date   Abnormal LFTs    Acute cerebrovascular accident (CVA) due to thrombosis of left middle cerebral artery ([place]) 01/05/2021   Anxiety    Aortic atherosclerosis ([place])    Arthritis    Basal cell carcinoma 09/14/2010   left calf    Bilateral carotid artery stenosis    a.) Carotid doppler 16/03/930: 35-57% LICA; 3-22% RICA. b.) CTA neck 01/30/2021: critical stenosis LEFT carotid bulb.   Carotid stenosis, symptomatic, with infarction ([place]) 01/20/2021   CKD (chronic kidney disease), stage III ([place])    History of kidney stones    Hyperlipidemia    Hypertension    Long term current use of antithrombotics/antiplatelets    a.) DAPT therapy (ASA + clopidogrel)   Prostate cancer ([place]) 03/16/2008   Dr. Ronny Bacon   Sinus bradycardia by electrocardiogram    Stroke ([place])  01/03/2021   a.) Acute infarction affecting the cortex. ?? incidental punctate acute white matter infarction just posterior to the atrium of the left lateral ventricle. Stenosis of the right M2 branch inferior division. Severe atherosclerotic disease of both PCAs with severe stenoses.    Past Surgical History:  Procedure Laterality Date   CATARACT EXTRACTION Bilateral    05/2021   COLONOSCOPY     ENDARTERECTOMY Left 02/19/2021   Procedure: ENDARTERECTOMY CAROTID;  Surgeon: Katha Cabal, MD;  Location: [facility] ORS;  Service: Vascular;  Laterality: Left;   PROSTATECTOMY  11/14/2008   transurethral, radical, Dr. Darcus Austin    Social History Social History   Tobacco Use   Smoking status: Never   Smokeless tobacco: Never  Vaping Use   Vaping Use: Never used  Substance Use Topics   Alcohol use: Yes    Alcohol/week: 8.0 standard drinks of alcohol    Types: 4 Glasses of wine, 4 Shots of liquor per week    Comment: occasional   Drug use: No    Family History Family History  Problem Relation Age of Onset   Alzheimer's disease Mother    Stroke Father    Prostate cancer Father    Alzheimer's disease Sister    Stroke Paternal Aunt    Heart disease Paternal Uncle 37    Allergies  Allergen Reactions   Alfuzosin Other (See Comments)    Other Reaction: fainting spell   Pravastatin Sodium     Other reaction(s):  Muscle Pain     REVIEW OF SYSTEMS (Negative unless checked)  Constitutional: '[]'$ Weight loss  '[]'$ Fever  '[]'$ Chills Cardiac: '[]'$ Chest pain   '[]'$ Chest pressure   '[]'$ Palpitations   '[]'$ Shortness of breath when laying flat   '[]'$ Shortness of breath with exertion. Vascular:  '[x]'$ Pain in legs with walking   '[]'$ Pain in legs at rest  '[]'$ History of DVT   '[]'$ Phlebitis   '[]'$ Swelling in legs   '[]'$ Varicose veins   '[]'$ Non-healing ulcers Pulmonary:   '[]'$ Uses home oxygen   '[]'$ Productive cough   '[]'$ Hemoptysis   '[]'$ Wheeze  '[]'$ COPD   '[]'$ Asthma Neurologic:  '[]'$ Dizziness   '[]'$ Seizures   '[]'$ History of stroke   '[]'$ History of  TIA  '[]'$ Aphasia   '[]'$ Vissual changes   '[]'$ Weakness or numbness in arm   '[]'$ Weakness or numbness in leg Musculoskeletal:   '[]'$ Joint swelling   '[]'$ Joint pain   '[]'$ Low back pain Hematologic:  '[]'$ Easy bruising  '[]'$ Easy bleeding   '[]'$ Hypercoagulable state   '[]'$ Anemic Gastrointestinal:  '[]'$ Diarrhea   '[]'$ Vomiting  '[]'$ Gastroesophageal reflux/heartburn   '[]'$ Difficulty swallowing. Genitourinary:  '[]'$ Chronic kidney disease   '[]'$ Difficult urination  '[]'$ Frequent urination   '[]'$ Blood in urine Skin:  '[]'$ Rashes   '[]'$ Ulcers  Psychological:  '[]'$ History of anxiety   '[]'$  History of major depression.  Physical Examination  There were no vitals filed for this visit. There is no height or weight on file to calculate BMI. Gen: WD/WN, NAD Head: [place]/AT, No temporalis wasting.  Ear/Nose/Throat: Hearing grossly intact, nares w/o erythema or drainage Eyes: PER, EOMI, sclera nonicteric.  Neck: Supple, no masses.  No bruit or JVD.  Pulmonary:  Good air movement, no audible wheezing, no use of accessory muscles.  Cardiac: RRR, normal S1, S2, no Murmurs. Vascular:  carotid bruit noted Vessel Right Left  Radial Palpable Palpable  Carotid  Palpable  Palpable  Subclav  Palpable Palpable  Gastrointestinal: soft, non-distended. No guarding/no peritoneal signs.  Musculoskeletal: M/S 5/5 throughout.  No visible deformity.  Neurologic: CN 2-12 intact. Pain and light touch intact in extremities.  Symmetrical.  Speech is fluent. Motor exam as listed above. Psychiatric: Judgment intact, Mood & affect appropriate for pt's clinical situation. Dermatologic: No rashes or ulcers noted.  No changes consistent with cellulitis.   CBC Lab Results  Component Value Date   WBC 6.0 09/15/2021   HGB 17.4 (H) 09/15/2021   HCT 51.9 09/15/2021   MCV 91.4 09/15/2021   PLT 245.0 09/15/2021    BMET    Component Value Date/Time   NA 138 09/15/2021 0920   NA 140 10/23/2011 1142   K 4.4 09/15/2021 0920   CL 106 09/15/2021 0920   CO2 23 09/15/2021 0920    GLUCOSE 94 09/15/2021 0920   BUN 23 09/15/2021 0920   BUN 20 10/23/2011 1142   CREATININE 1.26 09/15/2021 0920   CREATININE 1.26 (H) 12/22/2019 1508   CALCIUM 9.4 09/15/2021 0920   GFRNONAA 52 (L) 02/22/2021 2039   GFRAA 69 10/23/2011 1142   CrCl cannot be calculated (Patient's most recent lab result is older than the maximum 21 days allowed.).  COAG Lab Results  Component Value Date   INR 1.0 02/22/2021   INR 1.0 01/03/2021    Radiology No results found.   Assessment/Plan There are no diagnoses linked to this encounter.   Hortencia Pilar, MD  11/02/2021 4:13 PM

## 2021-11-03 ENCOUNTER — Ambulatory Visit (INDEPENDENT_AMBULATORY_CARE_PROVIDER_SITE_OTHER): Payer: Medicare Other

## 2021-11-03 ENCOUNTER — Encounter (INDEPENDENT_AMBULATORY_CARE_PROVIDER_SITE_OTHER): Payer: Self-pay | Admitting: Vascular Surgery

## 2021-11-03 ENCOUNTER — Ambulatory Visit (INDEPENDENT_AMBULATORY_CARE_PROVIDER_SITE_OTHER): Payer: Medicare Other | Admitting: Vascular Surgery

## 2021-11-03 VITALS — BP 150/94 | HR 46 | Resp 17 | Ht 72.0 in | Wt 210.8 lb

## 2021-11-03 DIAGNOSIS — I700 Atherosclerosis of aorta: Secondary | ICD-10-CM | POA: Diagnosis not present

## 2021-11-03 DIAGNOSIS — I6522 Occlusion and stenosis of left carotid artery: Secondary | ICD-10-CM

## 2021-11-03 DIAGNOSIS — E785 Hyperlipidemia, unspecified: Secondary | ICD-10-CM | POA: Diagnosis not present

## 2021-11-03 DIAGNOSIS — I63239 Cerebral infarction due to unspecified occlusion or stenosis of unspecified carotid arteries: Secondary | ICD-10-CM

## 2021-11-03 DIAGNOSIS — I10 Essential (primary) hypertension: Secondary | ICD-10-CM

## 2021-11-03 MED ORDER — CLOPIDOGREL BISULFATE 75 MG PO TABS: 75.0000 mg | ORAL_TABLET | Freq: Every day | ORAL | 6 refills | Status: DC

## 2021-11-05 ENCOUNTER — Ambulatory Visit: Payer: Medicare Other | Admitting: Physical Therapy

## 2021-11-05 ENCOUNTER — Telehealth (INDEPENDENT_AMBULATORY_CARE_PROVIDER_SITE_OTHER): Payer: Self-pay | Admitting: Vascular Surgery

## 2021-11-05 NOTE — Telephone Encounter (Signed)
LVM for pt advising of no prior authorization required for the CT ordered by Dr. Delana Meyer. I left Radiology scheduling # of [phone] and then asked pt to cal [place] back to schedule appt with Dr. Delana Meyer for the results.

## 2021-11-06 ENCOUNTER — Encounter (INDEPENDENT_AMBULATORY_CARE_PROVIDER_SITE_OTHER): Payer: Self-pay | Admitting: Vascular Surgery

## 2021-11-06 NOTE — Telephone Encounter (Signed)
Patient has been contacted and is scheduled for 11/13/21 @ 1:45 with Dr Delana Meyer

## 2021-11-07 ENCOUNTER — Ambulatory Visit: Payer: Medicare Other | Admitting: Physical Therapy

## 2021-11-12 ENCOUNTER — Ambulatory Visit
Admission: RE | Admit: 2021-11-12 | Discharge: 2021-11-12 | Disposition: A | Discharge status: Home / Self Care | Payer: Medicare Other | Source: Ambulatory Visit | Attending: Vascular Surgery | Admitting: Vascular Surgery

## 2021-11-12 DIAGNOSIS — Z9889 Other specified postprocedural states: Secondary | ICD-10-CM | POA: Diagnosis not present

## 2021-11-12 DIAGNOSIS — M47812 Spondylosis without myelopathy or radiculopathy, cervical region: Secondary | ICD-10-CM | POA: Diagnosis not present

## 2021-11-12 DIAGNOSIS — I6523 Occlusion and stenosis of bilateral carotid arteries: Secondary | ICD-10-CM | POA: Diagnosis not present

## 2021-11-12 DIAGNOSIS — I6522 Occlusion and stenosis of left carotid artery: Secondary | ICD-10-CM | POA: Diagnosis not present

## 2021-11-12 LAB — POCT I-STAT CREATININE: Creatinine, Ser: 1.5 mg/dL — ABNORMAL HIGH (ref 0.61–1.24)

## 2021-11-12 MED ORDER — IOHEXOL 350 MG/ML SOLN
75.0000 mL | Freq: Once | INTRAVENOUS | Status: AC | PRN
  Administered 2021-11-12: 75 mL via INTRAVENOUS

## 2021-11-13 ENCOUNTER — Ambulatory Visit (INDEPENDENT_AMBULATORY_CARE_PROVIDER_SITE_OTHER): Payer: Medicare Other | Admitting: Vascular Surgery

## 2021-11-18 ENCOUNTER — Telehealth (INDEPENDENT_AMBULATORY_CARE_PROVIDER_SITE_OTHER): Payer: Self-pay

## 2021-11-18 NOTE — Telephone Encounter (Signed)
Pt called stating that that his appt was cancelled to see Dr Delana Meyer and the dr would call him with CT results.  He has not heard anything from the dr and would like a call back about why his appt was cancelled and when the dr should be reaching out to him

## 2021-11-20 ENCOUNTER — Encounter (INDEPENDENT_AMBULATORY_CARE_PROVIDER_SITE_OTHER): Payer: Self-pay | Admitting: Vascular Surgery

## 2021-11-20 ENCOUNTER — Ambulatory Visit (INDEPENDENT_AMBULATORY_CARE_PROVIDER_SITE_OTHER): Payer: Medicare Other | Admitting: Vascular Surgery

## 2021-11-20 VITALS — BP 156/80 | HR 54 | Resp 18 | Ht 72.0 in | Wt 211.2 lb

## 2021-11-20 DIAGNOSIS — I10 Essential (primary) hypertension: Secondary | ICD-10-CM

## 2021-11-20 DIAGNOSIS — E785 Hyperlipidemia, unspecified: Secondary | ICD-10-CM | POA: Diagnosis not present

## 2021-11-20 DIAGNOSIS — N1831 Chronic kidney disease, stage 3a: Secondary | ICD-10-CM | POA: Diagnosis not present

## 2021-11-20 DIAGNOSIS — I6522 Occlusion and stenosis of left carotid artery: Secondary | ICD-10-CM

## 2021-11-21 ENCOUNTER — Ambulatory Visit: Payer: Medicare Other | Attending: Neurology

## 2021-11-21 DIAGNOSIS — G4733 Obstructive sleep apnea (adult) (pediatric): Secondary | ICD-10-CM | POA: Insufficient documentation

## 2021-11-21 DIAGNOSIS — D751 Secondary polycythemia: Secondary | ICD-10-CM | POA: Diagnosis present

## 2021-11-21 DIAGNOSIS — G4761 Periodic limb movement disorder: Secondary | ICD-10-CM | POA: Insufficient documentation

## 2021-11-21 DIAGNOSIS — R0683 Snoring: Secondary | ICD-10-CM | POA: Diagnosis not present

## 2021-11-21 DIAGNOSIS — Z8673 Personal history of transient ischemic attack (TIA), and cerebral infarction without residual deficits: Secondary | ICD-10-CM | POA: Diagnosis present

## 2021-11-22 ENCOUNTER — Encounter (INDEPENDENT_AMBULATORY_CARE_PROVIDER_SITE_OTHER): Payer: Self-pay | Admitting: Vascular Surgery

## 2021-11-22 NOTE — Progress Notes (Signed)
[name] : [id number]  Bobby Murillo is a 81 y.o. ([birth year]) male who presents with chief complaint of check carotid arteries.  History of Present Illness:   The patient is seen for follow up evaluation of carotid stenosis status post CT angiogram. CT scan was done 11/12/2021. Patient reports that the test went well with no problems or complications.   The patient denies interval amaurosis fugax. There is no recent or interval TIA symptoms or focal motor deficits. There is no prior documented CVA.  The patient is taking enteric-coated aspirin 81 mg daily.  There is no history of migraine headaches. There is no history of seizures.  No recent shortening of the patient's walking distance or new symptoms consistent with claudication.  No history of rest pain symptoms. No new ulcers or wounds of the lower extremities have occurred.  There is no history of DVT, PE or superficial thrombophlebitis. No recent episodes of angina or shortness of breath documented.   CT angiogram is reviewed by me personally and shows >80% stenosis consistent with calcified plaque at the origin of the left internal carotid artery.    Current Meds  Medication Sig   acetaminophen (TYLENOL) 325 MG tablet Take 650 mg by mouth every 6 (six) hours as needed for moderate pain.   ALPRAZolam (XANAX) 0.5 MG tablet TAKE 1 TABLET BY MOUTH AT BEDTIME AS NEEDED FOR SLEEP   aspirin EC 81 MG tablet Take 81 mg by mouth daily. Swallow whole.   clopidogrel (PLAVIX) 75 MG tablet Take 1 tablet (75 mg total) by mouth daily.   Collagen-Vitamin C-Biotin (COLLAGEN 1500/C PO) Take by mouth.   ezetimibe (ZETIA) 10 MG tablet Take 1 tablet by mouth once daily   fluticasone (FLONASE) 50 MCG/ACT nasal spray Place 2 sprays into both nostrils daily.   Glucosamine-Chondroitin (GLUCOSAMINE CHONDR COMPLEX PO) Take 1 tablet by mouth daily. tab by mouth daily   polycarbophil (FIBERCON) 625 MG tablet Take 625 mg by mouth daily.    Past Medical  History:  Diagnosis Date   Abnormal LFTs    Acute cerebrovascular accident (CVA) due to thrombosis of left middle cerebral artery ([place]) 01/05/2021   Anxiety    Aortic atherosclerosis (HCC)    Arthritis    Basal cell carcinoma 09/14/2010   left calf    Bilateral carotid artery stenosis    a.) Carotid doppler 69/48/5462: 70-35% LICA; 0-09% RICA. b.) CTA neck 01/30/2021: critical stenosis LEFT carotid bulb.   Carotid stenosis, symptomatic, with infarction ([place]) 01/20/2021   CKD (chronic kidney disease), stage III ([place])    History of kidney stones    Hyperlipidemia    Hypertension    Long term current use of antithrombotics/antiplatelets    a.) DAPT therapy (ASA + clopidogrel)   Prostate cancer ([place]) 03/16/2008   Dr. Ronny Bacon   Sinus bradycardia by electrocardiogram    Stroke ([place]) 01/03/2021   a.) Acute infarction affecting the cortex. ?? incidental punctate acute white matter infarction just posterior to the atrium of the left lateral ventricle. Stenosis of the right M2 branch inferior division. Severe atherosclerotic disease of both PCAs with severe stenoses.    Past Surgical History:  Procedure Laterality Date   CATARACT EXTRACTION Bilateral    05/2021   COLONOSCOPY     ENDARTERECTOMY Left 02/19/2021   Procedure: ENDARTERECTOMY CAROTID;  Surgeon: Katha Cabal, MD;  Location: [facility] ORS;  Service: Vascular;  Laterality: Left;   PROSTATECTOMY  11/14/2008   transurethral, radical, Dr. Darcus Austin  Social History Social History   Tobacco Use   Smoking status: Never   Smokeless tobacco: Never  Vaping Use   Vaping Use: Never used  Substance Use Topics   Alcohol use: Yes    Alcohol/week: 8.0 standard drinks of alcohol    Types: 4 Glasses of wine, 4 Shots of liquor per week    Comment: occasional   Drug use: No    Family History Family History  Problem Relation Age of Onset   Alzheimer's disease Mother    Stroke Father    Prostate cancer Father    Alzheimer's disease Sister     Stroke Paternal Aunt    Heart disease Paternal Uncle 39    Allergies  Allergen Reactions   Alfuzosin Other (See Comments)    Other Reaction: fainting spell   Pravastatin Sodium     Other reaction(s): Muscle Pain     REVIEW OF SYSTEMS (Negative unless checked)  Constitutional: '[]'$ Weight loss  '[]'$ Fever  '[]'$ Chills Cardiac: '[]'$ Chest pain   '[]'$ Chest pressure   '[]'$ Palpitations   '[]'$ Shortness of breath when laying flat   '[]'$ Shortness of breath with exertion. Vascular:  '[x]'$ Pain in legs with walking   '[]'$ Pain in legs at rest  '[]'$ History of DVT   '[]'$ Phlebitis   '[]'$ Swelling in legs   '[]'$ Varicose veins   '[]'$ Non-healing ulcers Pulmonary:   '[]'$ Uses home oxygen   '[]'$ Productive cough   '[]'$ Hemoptysis   '[]'$ Wheeze  '[]'$ COPD   '[]'$ Asthma Neurologic:  '[]'$ Dizziness   '[]'$ Seizures   '[x]'$ History of stroke   '[]'$ History of TIA  '[]'$ Aphasia   '[]'$ Vissual changes   '[]'$ Weakness or numbness in arm   '[]'$ Weakness or numbness in leg Musculoskeletal:   '[]'$ Joint swelling   '[x]'$ Joint pain   '[]'$ Low back pain Hematologic:  '[]'$ Easy bruising  '[]'$ Easy bleeding   '[]'$ Hypercoagulable state   '[]'$ Anemic Gastrointestinal:  '[]'$ Diarrhea   '[]'$ Vomiting  '[]'$ Gastroesophageal reflux/heartburn   '[]'$ Difficulty swallowing. Genitourinary:  '[]'$ Chronic kidney disease   '[]'$ Difficult urination  '[]'$ Frequent urination   '[]'$ Blood in urine Skin:  '[]'$ Rashes   '[]'$ Ulcers  Psychological:  '[]'$ History of anxiety   '[]'$  History of major depression.  Physical Examination  Vitals:   11/20/21 1526  BP: (!) 156/80  Pulse: (!) 54  Resp: 18  Weight: 211 lb 3.2 oz (95.8 kg)  Height: 6' (1.829 m)   Body mass index is 28.64 kg/m. Gen: WD/WN, NAD Head: [place]/AT, No temporalis wasting.  Ear/Nose/Throat: Hearing grossly intact, nares w/o erythema or drainage Eyes: PER, EOMI, sclera nonicteric.  Neck: Supple, no masses.  No bruit or JVD.  Pulmonary:  Good air movement, no audible wheezing, no use of accessory muscles.  Cardiac: RRR, normal S1, S2, no Murmurs. Vascular:  carotid bruit noted Vessel Right  Left  Radial Palpable Palpable  Carotid  Palpable  Palpable  Subclav  Palpable Palpable  Gastrointestinal: soft, non-distended. No guarding/no peritoneal signs.  Musculoskeletal: M/S 5/5 throughout.  No visible deformity.  Neurologic: CN 2-12 intact. Pain and light touch intact in extremities.  Symmetrical.  Speech is fluent. Motor exam as listed above. Psychiatric: Judgment intact, Mood & affect appropriate for pt's clinical situation. Dermatologic: No rashes or ulcers noted.  No changes consistent with cellulitis.   CBC Lab Results  Component Value Date   WBC 6.0 09/15/2021   HGB 17.4 (H) 09/15/2021   HCT 51.9 09/15/2021   MCV 91.4 09/15/2021   PLT 245.0 09/15/2021    BMET    Component Value Date/Time   NA 138 09/15/2021 0920   NA 140  10/23/2011 1142   K 4.4 09/15/2021 0920   CL 106 09/15/2021 0920   CO2 23 09/15/2021 0920   GLUCOSE 94 09/15/2021 0920   BUN 23 09/15/2021 0920   BUN 20 10/23/2011 1142   CREATININE 1.50 (H) 11/12/2021 0815   CREATININE 1.26 (H) 12/22/2019 1508   CALCIUM 9.4 09/15/2021 0920   GFRNONAA 52 (L) 02/22/2021 2039   GFRAA 69 10/23/2011 1142   Estimated Creatinine Clearance: 48 mL/min (A) (by C-G formula based on SCr of 1.5 mg/dL (H)).  COAG Lab Results  Component Value Date   INR 1.0 02/22/2021   INR 1.0 01/03/2021    Radiology CT ANGIO NECK W OR WO CONTRAST  Result Date: 11/12/2021 CLINICAL DATA:  Carotid stenosis. Postop left carotid endarterectomy. EXAM: CT ANGIOGRAPHY NECK TECHNIQUE: Multidetector CT imaging of the neck was performed using the standard protocol during bolus administration of intravenous contrast. Multiplanar CT image reconstructions and MIPs were obtained to evaluate the vascular anatomy. Carotid stenosis measurements (when applicable) are obtained utilizing NASCET criteria, using the distal internal carotid diameter as the denominator. RADIATION DOSE REDUCTION: This exam was performed according to the departmental  dose-optimization program which includes automated exposure control, adjustment of the mA and/or kV according to patient size and/or use of iterative reconstruction technique. CONTRAST:  38m OMNIPAQUE IOHEXOL 350 MG/ML SOLN COMPARISON:  CT angio neck 02/22/2021, 01/30/2021 FINDINGS: Aortic arch: Standard branching. Imaged portion shows no evidence of aneurysm or dissection. No significant stenosis of the major arch vessel origins. Right carotid system: Right carotid widely patent. Minimal atherosclerotic calcification right carotid bifurcation. Left carotid system: Postop left carotid endarterectomy. Severe stenosis distal left common carotid artery estimated to be 80% diameter stenosis. 60-70% diameter stenosis proximal left internal carotid artery. There is soft tissue thickening surrounding the carotid bifurcation likely due to carotid endarterectomy. This has improved since the more recent CT of 02/22/2021. No extravasation or pseudoaneurysm. No intraluminal thrombus. Vertebral arteries: Both vertebral arteries are widely patent and normal. Skeleton: Cervical spondylosis.  No acute skeletal abnormality. Other neck: No mass or adenopathy in the neck. Postop soft tissue thickening surrounding the left carotid bifurcation. Upper chest: Lung apices clear bilaterally. IMPRESSION: 1. Postop left carotid endarterectomy. Recurrent stenosis left carotid bifurcation. Approximately 80% diameter stenosis left common carotid artery below the bifurcation. Approximately 60-70% diameter stenosis proximal left internal carotid artery. 2. Right carotid widely patent. Both vertebral arteries widely patent. Electronically Signed   By: CFranchot GalloM.D.   On: 11/12/2021 14:29   VAS [place]  Result Date: 11/03/2021 Carotid Arterial Duplex Study Patient Name:  SJOLLY BLEICHER Date of Exam:   11/03/2021 Medical Rec #: [id number]      Accession #:    [id number] of Birth: [birth year]       Patient Gender: M Patient Age:   89 years Exam Location:  [facility] Procedure:      VAS [place] Referring Phys: FEulogio Ditch--------------------------------------------------------------------------------  Indications:       Carotid artery disease and left endarterectomy. Other Factors:     Lt CEA 02/2021. Comparison Study:  07/2021 Performing Technologist: TConcha NorwayRVT  Examination Guidelines: A complete evaluation includes B-mode imaging, spectral Doppler, color Doppler, and power Doppler as needed of all accessible portions of each vessel. Bilateral testing is considered an integral part of a complete examination. Limited examinations for reoccurring indications may be performed as noted.  Right Carotid Findings: +----------+--------+--------+--------+------------------+--------+  PSV cm/sEDV cm/sStenosisPlaque DescriptionComments +----------+--------+--------+--------+------------------+--------+ CCA Prox  71      17                                         +----------+--------+--------+--------+------------------+--------+ CCA Mid   65      24                                         +----------+--------+--------+--------+------------------+--------+ CCA Distal68      14                                         +----------+--------+--------+--------+------------------+--------+ ICA Prox  71      14      1-39%   heterogenous               +----------+--------+--------+--------+------------------+--------+ ICA Mid   62      16                                         +----------+--------+--------+--------+------------------+--------+ ICA Distal65      19                                         +----------+--------+--------+--------+------------------+--------+ ECA       127     19                                         +----------+--------+--------+--------+------------------+--------+ +----------+--------+-------+----------------+-------------------+            PSV cm/sEDV cmsDescribe        Arm Pressure (mmHG) +----------+--------+-------+----------------+-------------------+ [id number]             Multiphasic, WNL                    +----------+--------+-------+----------------+-------------------+ +---------+--------+--+--------+--+---------+ VertebralPSV cm/s33EDV cm/s10Antegrade +---------+--------+--+--------+--+---------+  Left Carotid Findings: +----------+-------+--------+--------+----------------+------------------------+           PSV    EDV cm/sStenosisPlaque          Comments                           cm/s                   Description                              +----------+-------+--------+--------+----------------+------------------------+ CCA Prox  30     11                                                       +----------+-------+--------+--------+----------------+------------------------+ CCA Mid   28     10                                                       +----------+-------+--------+--------+----------------+------------------------+  CCA Distal25     10                                                       +----------+-------+--------+--------+----------------+------------------------+ ICA Prox  336    127     80-99%                  post op narrowing s/p                                                     CEA                      +----------+-------+--------+--------+----------------+------------------------+ ICA Mid   334    142                                                      +----------+-------+--------+--------+----------------+------------------------+ ICA Distal208    86                                                       +----------+-------+--------+--------+----------------+------------------------+ ECA       32     6                                                         +----------+-------+--------+--------+----------------+------------------------+ +----------+--------+--------+----------------+-------------------+           PSV cm/sEDV cm/sDescribe        Arm Pressure (mmHG) +----------+--------+--------+----------------+-------------------+ [id number]             Multiphasic, WNL                    +----------+--------+--------+----------------+-------------------+ +---------+--------+--+--------+--+---------+ VertebralPSV cm/s35EDV cm/s13Antegrade +---------+--------+--+--------+--+---------+   Summary: Right Carotid: Velocities in the right ICA are consistent with a 1-39% stenosis. Left Carotid: Velocities in the left ICA are consistent with a 80-99% stenosis.               The ECA appears <50% stenosed. Post op bulb/ICA narrowing was seen               on previous exam but has significantly worsened. ECA shows low               velocities post bulb narrowing. Vertebrals:  Bilateral vertebral arteries demonstrate antegrade flow. Subclavians: Normal flow hemodynamics were seen in bilateral subclavian              arteries. *See table(s) above for measurements and observations.  Electronically signed by Hortencia Pilar MD on 11/03/2021 at 6:04:06 PM.    Final      Assessment/Plan 1. Carotid stenosis, symptomatic w/o infarct, left Recommend:  The patient is symptomatic with  respect to the carotid stenosis.  The patient now has progressed and has a lesion the is >70%.  Patient's CT angiography of the carotid arteries confirms >70% left ICA stenosis.  The anatomical considerations support stenting over surgery.  This was discussed in detail with the patient.  The risks, benefits and alternative therapies were reviewed in detail with the patient.  All questions were answered.  The patient agrees to proceed with stenting of the left carotid artery.  Continue antiplatelet therapy as prescribed. Continue management of CAD, HTN and  Hyperlipidemia. Healthy heart diet, encouraged exercise at least 4 times per week.    2. Essential hypertension Continue antihypertensive medications as already ordered, these medications have been reviewed and there are no changes at this time.   3. Stage 3a chronic kidney disease ([place]) The patient has advanced renal disease.  However, at the present time the patient is not yet on dialysis.  Avoid nephrotoxic medications and dehydration.  Further plans per nephrology   4. Hyperlipidemia, unspecified hyperlipidemia type Continue statin as ordered and reviewed, no changes at this time     Hortencia Pilar, MD  11/22/2021 12:18 PM

## 2021-11-23 DIAGNOSIS — G4730 Sleep apnea, unspecified: Secondary | ICD-10-CM | POA: Diagnosis not present

## 2021-11-27 ENCOUNTER — Ambulatory Visit (INDEPENDENT_AMBULATORY_CARE_PROVIDER_SITE_OTHER): Payer: Medicare Other | Admitting: Vascular Surgery

## 2021-11-28 ENCOUNTER — Telehealth: Payer: Self-pay | Admitting: Internal Medicine

## 2021-11-28 ENCOUNTER — Telehealth (INDEPENDENT_AMBULATORY_CARE_PROVIDER_SITE_OTHER): Payer: Self-pay

## 2021-11-28 DIAGNOSIS — G4733 Obstructive sleep apnea (adult) (pediatric): Secondary | ICD-10-CM | POA: Insufficient documentation

## 2021-11-28 NOTE — Assessment & Plan Note (Signed)
Autotitrating CPAP with pressure range of 5 to 20 cm H20 ordered

## 2021-11-28 NOTE — Telephone Encounter (Signed)
Spoke with the patient and he is scheduled with Dr. Delana Meyer for a left carotid stent placement on 12/30/21 with a 6:45 am arrival time to the [facility]. Pre-procedure instructions were discussed and will be mailed.

## 2021-12-07 ENCOUNTER — Encounter: Payer: Self-pay | Admitting: Internal Medicine

## 2021-12-08 NOTE — Telephone Encounter (Signed)
Spoke with pt and scheduled him for an appt tomorrow with Dr. Olivia Mackie for a possible UTI.

## 2021-12-09 ENCOUNTER — Encounter: Payer: Self-pay | Admitting: Internal Medicine

## 2021-12-09 ENCOUNTER — Ambulatory Visit (INDEPENDENT_AMBULATORY_CARE_PROVIDER_SITE_OTHER): Payer: Medicare Other | Admitting: Internal Medicine

## 2021-12-09 VITALS — BP 118/70 | HR 66 | Temp 98.1°F | Ht 72.0 in | Wt 207.6 lb

## 2021-12-09 DIAGNOSIS — R350 Frequency of micturition: Secondary | ICD-10-CM

## 2021-12-09 DIAGNOSIS — Z87442 Personal history of urinary calculi: Secondary | ICD-10-CM | POA: Diagnosis not present

## 2021-12-09 DIAGNOSIS — R109 Unspecified abdominal pain: Secondary | ICD-10-CM

## 2021-12-09 DIAGNOSIS — I491 Atrial premature depolarization: Secondary | ICD-10-CM

## 2021-12-09 DIAGNOSIS — R1030 Lower abdominal pain, unspecified: Secondary | ICD-10-CM

## 2021-12-09 DIAGNOSIS — R1032 Left lower quadrant pain: Secondary | ICD-10-CM | POA: Diagnosis not present

## 2021-12-09 DIAGNOSIS — R319 Hematuria, unspecified: Secondary | ICD-10-CM | POA: Diagnosis not present

## 2021-12-09 DIAGNOSIS — K5900 Constipation, unspecified: Secondary | ICD-10-CM | POA: Diagnosis not present

## 2021-12-09 NOTE — Progress Notes (Signed)
Chief Complaint  Patient presents with   Urinary Frequency   F/u  1. Frequent urination with dark urine tea/"banana pudding" last Saturday had sxs and left back pain radiated to llq and he has been constipated.  17-18 years ago had kidney stones and h/o UTI   Review of Systems  Constitutional:  Negative for weight loss.  HENT:  Negative for hearing loss.   Eyes:  Negative for blurred vision.  Respiratory:  Negative for shortness of breath.   Cardiovascular:  Negative for chest pain.  Gastrointestinal:  Positive for abdominal pain and constipation. Negative for blood in stool.  Genitourinary:  Positive for flank pain and frequency.  Musculoskeletal:  Negative for back pain.  Skin:  Negative for rash.  Neurological:  Negative for headaches.  Psychiatric/Behavioral:  Negative for depression.    Past Medical History:  Diagnosis Date   Abnormal LFTs    Acute cerebrovascular accident (CVA) due to thrombosis of left middle cerebral artery ([place]) 01/05/2021   Anxiety    Aortic atherosclerosis (H[facility])    Arthritis    Basal cell carcinoma 09/14/2010   left calf    Bilateral carotid artery stenosis    a.) Carotid doppler 28/31/5176: 16-07% LICA; 3-71% RICA. b.) CTA neck 01/30/2021: critical stenosis LEFT carotid bulb.   Carotid stenosis, symptomatic, with infarction ([place]) 01/20/2021   CKD (chronic kidney disease), stage III ([place])    History of kidney stones    Hyperlipidemia    Hypertension    Long term current use of antithrombotics/antiplatelets    a.) DAPT therapy (ASA + clopidogrel)   Prostate cancer ([place]) 03/16/2008   Dr. Ronny Bacon   Sinus bradycardia by electrocardiogram    Stroke ([place]) 01/03/2021   a.) Acute infarction affecting the cortex. ?? incidental punctate acute white matter infarction just posterior to the atrium of the left lateral ventricle. Stenosis of the right M2 branch inferior division. Severe atherosclerotic disease of both PCAs with severe stenoses.   Past Surgical  History:  Procedure Laterality Date   CATARACT EXTRACTION Bilateral    05/2021   COLONOSCOPY     ENDARTERECTOMY Left 02/19/2021   Procedure: ENDARTERECTOMY CAROTID;  Surgeon: Katha Cabal, MD;  Location: [facility] ORS;  Service: Vascular;  Laterality: Left;   PROSTATECTOMY  11/14/2008   transurethral, radical, Dr. Darcus Austin   Family History  Problem Relation Age of Onset   Alzheimer's disease Mother    Stroke Father    Prostate cancer Father    Alzheimer's disease Sister    Stroke Paternal Aunt    Heart disease Paternal Uncle 24   Social History   Socioeconomic History   Marital status: Married    Spouse name: [name]   Number of children: Not on file   Years of education: Not on file   Highest education level: Not on file  Occupational History   Not on file  Tobacco Use   Smoking status: Never   Smokeless tobacco: Never  Vaping Use   Vaping Use: Never used  Substance and Sexual Activity   Alcohol use: Yes    Alcohol/week: 8.0 standard drinks of alcohol    Types: 4 Glasses of wine, 4 Shots of liquor per week    Comment: occasional   Drug use: No   Sexual activity: Yes  Other Topics Concern   Not on file  Social History Narrative   Patient lives in [place] with his wife.  Non-smoker.  Drinks over the weekend.  Retired from [employer].  Right handed   Social Determinants of Health   Financial Resource Strain: Low Risk  (04/09/2021)   Overall Financial Resource Strain (CARDIA)    Difficulty of Paying Living Expenses: Not hard at all  Food Insecurity: No Food Insecurity (04/09/2021)   Hunger Vital Sign    Worried About Running Out of Food in the Last Year: Never true    Ran Out of Food in the Last Year: Never true  Transportation Needs: No Transportation Needs (04/09/2021)   PRAPARE - Hydrologist (Medical): No    Lack of Transportation (Non-Medical): No  Physical Activity: Insufficiently Active (04/09/2021)   Exercise Vital Sign    Days of  Exercise per Week: 1 day    Minutes of Exercise per Session: 90 min  Stress: No Stress Concern Present (04/09/2021)   [place]    Feeling of Stress : Not at all  Social Connections: Unknown (04/09/2021)   Social Connection and Isolation Panel [NHANES]    Frequency of Communication with Friends and Family: More than three times a week    Frequency of Social Gatherings with Friends and Family: Once a week    Attends Religious Services: Not on file    Active Member of Clubs or Organizations: Yes    Attends Archivist Meetings: Not on file    Marital Status: Married  Intimate Partner Violence: Not At Risk (04/09/2021)   Humiliation, Afraid, Rape, and Kick questionnaire    Fear of Current or Ex-Partner: No    Emotionally Abused: No    Physically Abused: No    Sexually Abused: No   Current Meds  Medication Sig   acetaminophen (TYLENOL) 325 MG tablet Take 650 mg by mouth every 6 (six) hours as needed for moderate pain.   ALPRAZolam (XANAX) 0.5 MG tablet TAKE 1 TABLET BY MOUTH AT BEDTIME AS NEEDED FOR SLEEP   aspirin EC 81 MG tablet Take 81 mg by mouth daily. Swallow whole.   clopidogrel (PLAVIX) 75 MG tablet Take 1 tablet (75 mg total) by mouth daily.   Collagen-Vitamin C-Biotin (COLLAGEN 1500/C PO) Take by mouth.   ezetimibe (ZETIA) 10 MG tablet Take 1 tablet by mouth once daily   fluticasone (FLONASE) 50 MCG/ACT nasal spray Place 2 sprays into both nostrils daily.   Glucosamine-Chondroitin (GLUCOSAMINE CHONDR COMPLEX PO) Take 1 tablet by mouth daily. tab by mouth daily   polycarbophil (FIBERCON) 625 MG tablet Take 625 mg by mouth daily.   Allergies  Allergen Reactions   Alfuzosin Other (See Comments)    Other Reaction: fainting spell   Pravastatin Sodium     Other reaction(s): Muscle Pain   Recent Results (from the past 2160 hour(s))  CBC with Differential/Platelet     Status: Abnormal   Collection Time:  09/15/21  9:20 AM  Result Value Ref Range   WBC 6.0 4.0 - 10.5 K/uL   RBC 5.68 4.22 - 5.81 Mil/uL   Hemoglobin 17.4 (H) 13.0 - 17.0 g/dL   HCT 51.9 39.0 - 52.0 %   MCV 91.4 78.0 - 100.0 fl   MCHC 33.6 30.0 - 36.0 g/dL   RDW 14.3 11.5 - 15.5 %   Platelets 245.0 150.0 - 400.0 K/uL   Neutrophils Relative % 67.9 43.0 - 77.0 %   Lymphocytes Relative 15.8 12.0 - 46.0 %   Monocytes Relative 12.1 (H) 3.0 - 12.0 %   Eosinophils Relative 3.3 0.0 - 5.0 %  Basophils Relative 0.9 0.0 - 3.0 %   Neutro Abs 4.1 1.4 - 7.7 K/uL   Lymphs Abs 0.9 0.7 - 4.0 K/uL   Monocytes Absolute 0.7 0.1 - 1.0 K/uL   Eosinophils Absolute 0.2 0.0 - 0.7 K/uL   Basophils Absolute 0.1 0.0 - 0.1 K/uL  Comprehensive metabolic panel     Status: Abnormal   Collection Time: 09/15/21  9:20 AM  Result Value Ref Range   Sodium 138 135 - 145 mEq/L   Potassium 4.4 3.5 - 5.1 mEq/L   Chloride 106 96 - 112 mEq/L   CO2 23 19 - 32 mEq/L   Glucose, Bld 94 70 - 99 mg/dL   BUN 23 6 - 23 mg/dL   Creatinine, Ser 1.26 0.40 - 1.50 mg/dL   Total Bilirubin 1.1 0.2 - 1.2 mg/dL   Alkaline Phosphatase 94 39 - 117 U/L   AST 22 0 - 37 U/L   ALT 28 0 - 53 U/L   Total Protein 6.5 6.0 - 8.3 g/dL   Albumin 4.1 3.5 - 5.2 g/dL   GFR 54.34 (L) >60.00 mL/min    Comment: Calculated using the CKD-EPI Creatinine Equation (2021)   Calcium 9.4 8.4 - 10.5 mg/dL  Lipid panel     Status: None   Collection Time: 09/15/21  9:20 AM  Result Value Ref Range   Cholesterol 118 0 - 200 mg/dL    Comment: ATP III Classification       Desirable:  < 200 mg/dL               Borderline High:  200 - 239 mg/dL          High:  > = 240 mg/dL   Triglycerides 93.0 0.0 - 149.0 mg/dL    Comment: Normal:  <150 mg/dLBorderline High:  150 - 199 mg/dL   HDL 60.00 >39.00 mg/dL   VLDL 18.6 0.0 - 40.0 mg/dL   LDL Cholesterol 39 0 - 99 mg/dL   Total CHOL/HDL Ratio 2     Comment:                Men          Women1/2 Average Risk     3.4          3.3Average Risk          5.0           4.42X Average Risk          9.6          7.13X Average Risk          15.0          11.0                       NonHDL 57.66     Comment: NOTE:  Non-HDL goal should be 30 mg/dL higher than patient's LDL goal (i.e. LDL goal of < 70 mg/dL, would have non-HDL goal of < 100 mg/dL)  I-STAT creatinine     Status: Abnormal   Collection Time: 11/12/21  8:15 AM  Result Value Ref Range   Creatinine, Ser 1.50 (H) 0.61 - 1.24 mg/dL   Objective  Body mass index is 28.16 kg/m. Wt Readings from Last 3 Encounters:  12/09/21 207 lb 9.6 oz (94.2 kg)  11/20/21 211 lb 3.2 oz (95.8 kg)  11/03/21 210 lb 12.8 oz (95.6 kg)   Temp Readings from Last 3 Encounters:  12/09/21 98.1 F (  36.7 C) (Oral)  09/15/21 (!) 97.3 F (36.3 C) (Oral)  06/16/21 (!) 97.2 F (36.2 C) (Oral)   BP Readings from Last 3 Encounters:  12/09/21 118/70  11/20/21 (!) 156/80  11/03/21 (!) 150/94   Pulse Readings from Last 3 Encounters:  12/09/21 66  11/20/21 (!) 54  11/03/21 (!) 46    Physical Exam Vitals and nursing note reviewed.  Constitutional:      Appearance: Normal appearance. He is well-developed and well-groomed.  HENT:     Head: Normocephalic and atraumatic.  Eyes:     Conjunctiva/sclera: Conjunctivae normal.     Pupils: Pupils are equal, round, and reactive to light.  Cardiovascular:     Rate and Rhythm: Normal rate and regular rhythm.     Heart sounds: Normal heart sounds.     Comments: +PACs Pulmonary:     Effort: Pulmonary effort is normal. No respiratory distress.     Breath sounds: Normal breath sounds.  Abdominal:     Tenderness: There is no abdominal tenderness.  Skin:    General: Skin is warm and moist.  Neurological:     General: No focal deficit present.     Mental Status: He is alert and oriented to person, place, and time. Mental status is at baseline.     Sensory: Sensation is intact.     Motor: Motor function is intact.     Coordination: Coordination is intact.     Gait: Gait is  intact. Gait normal.  Psychiatric:        Attention and Perception: Attention and perception normal.        Mood and Affect: Mood and affect normal.        Speech: Speech normal.        Behavior: Behavior normal. Behavior is cooperative.        Thought Content: Thought content normal.        Cognition and Memory: Cognition and memory normal.        Judgment: Judgment normal.     Assessment  Plan  LLQ ab pain with hematuria and left flank pain-constipation, kidney stones if pain does not resolve consider CT abd/pelvis  will order  Frequent urination with h/o kidney stones and passed kidney stone ct 2020 - Plan: Urine Culture, Urinalysis, Routine w reflex microscopic  Constipation, unspecified constipation type Miralax 1-2 capfuls both in 8 ounces of liquid  Warm prune juice  Colace=stool softner 1-2 capsules with above   Premature atrial complex f/u cardiology   Provider: Dr. Olivia Mackie McLean-Scocuzza-Internal Medicine

## 2021-12-09 NOTE — Patient Instructions (Addendum)
Miralax 1-2 capfuls both in 8 ounces of liquid  Warm prune juice  Colace=stool softner 1-2 capsules with above   Call cardiology and make appt for f/u due 06/17/22   Phone Fax E-mail Address  [phone] Not available [address]   [place] [address]     Specialties     Cardiology         Premature Atrial Contraction  A premature atrial contraction [facility]) is a kind of irregular heartbeat (arrhythmia). It happens when the heart beats too early and then pauses before beating again. The heart has four areas, or chambers. Normally, electrical signals spread across the heart and make all the chambers beat together. During a PAC, the upper chambers of the heart (atria) beat too early, before they have had time to fill with blood. The heartbeat pauses afterward so the heart can fill with blood for the next beat. Sometimes PAC can be a warning sign of another type of arrhythmia called atrial fibrillation. Atrial fibrillation may allow blood to pool in the atria and form clots. If a clot travels to the brain, it can cause a stroke. What are the causes? The cause of this condition is often unknown. Sometimes, this condition may be caused by heart disease or injury to the heart. What increases the risk? You are more likely to develop this condition if: You are a child. You are an adult who is 70 years of age or older. Episodes may be triggered by: Caffeine. Alcohol. Tobacco use. Stimulant drugs. Some medicines or supplements. Stress. Heart disease. What are the signs or symptoms? Symptoms of this condition include: A feeling that your heart skipped a beat. The first heartbeat after the "skipped" beat may feel more forceful. A feeling that your heart is fluttering. How is this diagnosed? This condition is diagnosed based on: Your symptoms. A physical exam. Your health care provider may listen to your heart. An electrocardiogram (ECG). This is a test that  records the electrical impulses of the heart. An ambulatory cardiac monitor. This device records your heartbeats for 24 hours or more. You may also have: An echocardiogram to check for any heart conditions. This is a type of imaging test that uses sound waves (ultrasound) to make images of your heart. Blood tests. How is this treated? Treatment depends on the frequency of your symptoms and other risk factors. Treatments may include: Medicines (beta-blockers). Catheter ablation. This is done to destroy the part of the heart tissue that sends abnormal signals. In some cases, treatment may not be needed for this condition. Follow these instructions at home: Lifestyle Do not use any products that contain nicotine or tobacco, such as cigarettes, e-cigarettes, and chewing tobacco. If you need help quitting, ask your health care provider. Exercise regularly. Ask your health care provider what type of exercise is safe for you. Find healthy ways to manage stress. Try to get at least 7-9 hours of sleep each night, or as much as recommended by your health care provider. Alcohol use Do not drink alcohol if: Your health care provider tells you not to drink. You are pregnant, may be pregnant, or are planning to become pregnant. Alcohol triggers your episodes. If you drink alcohol: Limit how much you use to: 0-1 drink a day for women. 0-2 drinks a day for men. Be aware of how much alcohol is in your drink. In the [place] one drink equals one 12 oz bottle of beer (355 mL), one  5 oz glass of wine (148 mL), or one 1 oz glass of hard liquor (44 mL). General instructions Take over-the-counter and prescription medicines only as told by your health care provider. If caffeine triggers episodes, do not eat, drink, or use anything with caffeine in it. Keep all follow-up visits as told by your health care provider. This is important. Contact a health care provider if: You feel your heart skipping beats. Your  heart skips beats and you feel dizzy, light-headed, or very tired. Get help right away if you have: Chest pain. Trouble breathing. Any symptoms of a stroke. "BE FAST" is an easy way to remember the main warning signs of a stroke. B - Balance. Signs are dizziness, sudden trouble walking, or loss of balance. E - Eyes. Signs are trouble seeing or a sudden change in vision. F - Face. Signs are sudden weakness or numbness of the face, or the face or eyelid drooping on one side. A - Arms. Signs are weakness or numbness in an arm. This happens suddenly and usually on one side of the body. S - Speech. Signs are sudden trouble speaking, slurred speech, or trouble understanding what people say. T - Time. Time to call emergency services. Write down what time symptoms started. Other signs of stroke, such as: A sudden, severe headache with no known cause. Nausea or vomiting. Seizure. These symptoms may represent a serious problem that is an emergency. Do not wait to see if the symptoms will go away. Get medical help right away. Call your local emergency services (911 in the [place] Do not drive yourself to the hospital. Summary A premature atrial contraction [facility]) is a kind of irregular heartbeat (arrhythmia). It happens when the heart beats too early and then pauses before beating again. Treatment depends on your symptoms and whether you have other underlying heart conditions. Contact a health care provider if your heart skips beats and you feel dizzy, light-headed, or very tired. In some cases, this condition may lead to a stroke. "BE FAST" is an easy way to remember the warning signs of stroke. Get help right away if you have any of the "BE FAST" signs. This information is not intended to replace advice given to you by your health care provider. Make sure you discuss any questions you have with your health care provider. Document Revised: 06/05/2021 Document Reviewed: 07/30/2020 Elsevier Patient  Education  [place].  Constipation, Adult Constipation is when a person has fewer than three bowel movements in a week, has difficulty having a bowel movement, or has stools (feces) that are dry, hard, or larger than normal. Constipation may be caused by an underlying condition. It may become worse with age if a person takes certain medicines and does not take in enough fluids. Follow these instructions at home: Eating and drinking  Eat foods that have a lot of fiber, such as beans, whole grains, and fresh fruits and vegetables. Limit foods that are low in fiber and high in fat and processed sugars, such as fried or sweet foods. These include french fries, hamburgers, cookies, candies, and soda. Drink enough fluid to keep your urine pale yellow. General instructions Exercise regularly or as told by your health care provider. Try to do 150 minutes of moderate exercise each week. Use the bathroom when you have the urge to go. Do not hold it in. Take over-the-counter and prescription medicines only as told by your health care provider. This includes any fiber supplements. During bowel movements: Practice  deep breathing while relaxing the lower abdomen. Practice pelvic floor relaxation. Watch your condition for any changes. Let your health care provider know about them. Keep all follow-up visits as told by your health care provider. This is important. Contact a health care provider if: You have pain that gets worse. You have a fever. You do not have a bowel movement after 4 days. You vomit. You are not hungry or you lose weight. You are bleeding from the opening between the buttocks (anus). You have thin, pencil-like stools. Get help right away if: You have a fever and your symptoms suddenly get worse. You leak stool or have blood in your stool. Your abdomen is bloated. You have severe pain in your abdomen. You feel dizzy or you faint. Summary Constipation is when a person has  fewer than three bowel movements in a week, has difficulty having a bowel movement, or has stools (feces) that are dry, hard, or larger than normal. Eat foods that have a lot of fiber, such as beans, whole grains, and fresh fruits and vegetables. Drink enough fluid to keep your urine pale yellow. Take over-the-counter and prescription medicines only as told by your health care provider. This includes any fiber supplements. This information is not intended to replace advice given to you by your health care provider. Make sure you discuss any questions you have with your health care provider. Document Revised: 01/18/2019 Document Reviewed: 01/18/2019 Elsevier Patient Education  [place].   Kidney Stones  Kidney stones are solid, rock-like deposits that form inside of the kidneys. The kidneys are a pair of organs that make urine. A kidney stone may form in a kidney and move into other parts of the urinary tract, including the tubes that connect the kidneys to the bladder (ureters), the bladder, and the tube that carries urine out of the body (urethra). As the stone moves through these areas, it can cause intense pain and block the flow of urine. Kidney stones are created when high levels of certain minerals are found in the urine. The stones are usually passed out of the body through urination, but in some cases, medical treatment may be needed to remove them. What are the causes? Kidney stones may be caused by: A condition in which certain glands produce too much parathyroid hormone (primary hyperparathyroidism), which causes too much calcium buildup in the blood. A buildup of uric acid crystals in the bladder (hyperuricosuria). Uric acid is a chemical that the body produces when you eat certain foods. It usually leaves the body in the urine. Narrowing (stricture) of one or both of the ureters. A kidney blockage that is present at birth (congenital obstruction). Past surgery on the kidney  or the ureters. What increases the risk? The following factors may make you more likely to develop this condition: Having had a kidney stone in the past. Having a family history of kidney stones. Not drinking enough water. Eating a diet that is high in protein, salt (sodium), or sugar. Being overweight or obese. What are the signs or symptoms? Symptoms of a kidney stone may include: Pain in the side of the abdomen, right below the ribs (flank pain). Pain usually spreads (radiates) to the groin. Needing to urinate often or urgently. Painful urination. Blood in the urine (hematuria). Nausea. Vomiting. Fever and chills. How is this diagnosed? This condition may be diagnosed based on: Your symptoms and medical history. A physical exam. Blood tests. Urine tests. These may be done before and after  the stone passes out of your body through urination. Imaging tests, such as a CT scan, abdominal X-ray, or ultrasound. A procedure to examine the inside of the bladder (cystoscopy). How is this treated? Treatment for kidney stones depends on the size, location, and makeup of the stones. Kidney stones will often pass out of the body through urination. You may need to: Increase your fluid intake to help pass the stone. In some cases, you may be given fluids through an IV and may need to be monitored in the hospital. Take medicine for pain. Make changes in your diet to help prevent kidney stones from coming back. Sometimes, procedures are needed to remove a kidney stone. This may involve: A procedure to break up kidney stones using: A focused beam of light (laser therapy). Shock waves (extracorporeal shock wave lithotripsy). Surgery to remove kidney stones. This may be needed if you have severe pain or have stones that block your urinary tract. Follow these instructions at home: Medicines Take over-the-counter and prescription medicines only as told by your health care provider. Ask your health  care provider if the medicine prescribed to you requires you to avoid driving or using heavy machinery. Eating and drinking Drink enough fluid to keep your urine pale yellow. You may be instructed to drink at least 8-10 glasses of water each day. This will help you pass the kidney stone. If directed, change your diet. This may include: Limiting how much sodium you eat. Eating more fruits and vegetables. Limiting how much animal protein you eat. Animal proteins include red meat, poultry, fish, and eggs. Eating a normal amount of calcium (1,000-1,300 mg per day). Follow instructions from your health care provider about eating or drinking restrictions. General instructions Collect urine samples as told by your health care provider. You may need to collect a urine sample: 24 hours after you pass the stone. 8-12 weeks after you pass the kidney stone, and every 6-12 months after that. Strain your urine every time you urinate, for as long as directed. Use the strainer that your health care provider recommends. Do not throw out the kidney stone after passing it. Keep the stone so it can be tested by your health care provider. Testing the makeup of your kidney stone may help prevent you from getting kidney stones in the future. Keep all follow-up visits. You may need follow-up X-rays or ultrasounds to make sure that your stone has passed. How is this prevented? To prevent another kidney stone: Drink enough fluid to keep your urine pale yellow. This is the best way to prevent kidney stones. Eat a healthy diet. Follow recommendations from your health care provider about foods to avoid. Recommendations vary depending on the type of kidney stone that you have. You may be instructed to eat a low-protein diet. Maintain a healthy weight. Where to find more information [place]): www.kidney.[facility]): www.urologyhealth.org Contact a health care provider if: You  have pain that gets worse or does not get better with medicine. Get help right away if: You have a fever or chills. You develop severe pain. You develop new abdominal pain. You faint. You are unable to urinate. Summary Kidney stones are solid, rock-like deposits that form inside of the kidneys. Kidney stones can cause nausea, vomiting, blood in the urine, abdominal pain, and the urge to urinate often. Treatment for kidney stones depends on the size, location, and makeup of the stones. Kidney stones will often pass out of the body  through urination. Kidney stones can be prevented by drinking enough fluids, eating a healthy diet, and maintaining a healthy weight. This information is not intended to replace advice given to you by your health care provider. Make sure you discuss any questions you have with your health care provider. Document Revised: 06/11/2021 Document Reviewed: 06/11/2021 Elsevier Patient Education  [place].

## 2021-12-10 LAB — URINALYSIS, ROUTINE W REFLEX MICROSCOPIC
Bacteria, UA: NONE SEEN /HPF
Bilirubin Urine: NEGATIVE
Glucose, UA: NEGATIVE
Hyaline Cast: NONE SEEN /LPF
Ketones, ur: NEGATIVE
Leukocytes,Ua: NEGATIVE
Nitrite: NEGATIVE
Protein, ur: NEGATIVE
Specific Gravity, Urine: 1.009 (ref 1.001–1.035)
Squamous Epithelial / HPF: NONE SEEN /HPF (ref <=5)
WBC, UA: NONE SEEN /HPF (ref 0–5)
pH: 5.5 (ref 5.0–8.0)

## 2021-12-10 LAB — URINE CULTURE
MICRO NUMBER:: 13969615
Result:: NO GROWTH
SPECIMEN QUALITY:: ADEQUATE

## 2021-12-10 LAB — MICROSCOPIC MESSAGE

## 2021-12-11 ENCOUNTER — Encounter: Payer: Self-pay | Admitting: Internal Medicine

## 2021-12-11 NOTE — Addendum Note (Signed)
Addended by: Orland Mustard on: 12/11/2021 12:46 PM   Modules accepted: Orders

## 2021-12-16 ENCOUNTER — Ambulatory Visit
Admission: RE | Admit: 2021-12-16 | Discharge: 2021-12-16 | Disposition: A | Discharge status: Home / Self Care | Payer: Medicare Other | Source: Ambulatory Visit | Attending: Internal Medicine | Admitting: Internal Medicine

## 2021-12-16 DIAGNOSIS — R109 Unspecified abdominal pain: Secondary | ICD-10-CM | POA: Diagnosis not present

## 2021-12-16 DIAGNOSIS — N134 Hydroureter: Secondary | ICD-10-CM | POA: Diagnosis not present

## 2021-12-16 DIAGNOSIS — I700 Atherosclerosis of aorta: Secondary | ICD-10-CM | POA: Diagnosis not present

## 2021-12-16 DIAGNOSIS — R1030 Lower abdominal pain, unspecified: Secondary | ICD-10-CM

## 2021-12-16 DIAGNOSIS — Z87442 Personal history of urinary calculi: Secondary | ICD-10-CM

## 2021-12-16 DIAGNOSIS — R319 Hematuria, unspecified: Secondary | ICD-10-CM | POA: Diagnosis not present

## 2021-12-16 DIAGNOSIS — N1330 Unspecified hydronephrosis: Secondary | ICD-10-CM | POA: Diagnosis not present

## 2021-12-16 DIAGNOSIS — R350 Frequency of micturition: Secondary | ICD-10-CM

## 2021-12-16 DIAGNOSIS — R10A2 Flank pain, left side: Secondary | ICD-10-CM

## 2021-12-19 ENCOUNTER — Ambulatory Visit (INDEPENDENT_AMBULATORY_CARE_PROVIDER_SITE_OTHER): Payer: Medicare Other | Admitting: Internal Medicine

## 2021-12-19 ENCOUNTER — Encounter: Payer: Self-pay | Admitting: Internal Medicine

## 2021-12-19 VITALS — BP 122/80 | HR 47 | Temp 97.6°F | Ht 72.0 in | Wt 201.4 lb

## 2021-12-19 DIAGNOSIS — N200 Calculus of kidney: Secondary | ICD-10-CM | POA: Diagnosis not present

## 2021-12-19 DIAGNOSIS — E785 Hyperlipidemia, unspecified: Secondary | ICD-10-CM | POA: Diagnosis not present

## 2021-12-19 DIAGNOSIS — G4733 Obstructive sleep apnea (adult) (pediatric): Secondary | ICD-10-CM | POA: Diagnosis not present

## 2021-12-19 DIAGNOSIS — Z23 Encounter for immunization: Secondary | ICD-10-CM

## 2021-12-19 DIAGNOSIS — F5102 Adjustment insomnia: Secondary | ICD-10-CM | POA: Diagnosis not present

## 2021-12-19 MED ORDER — PRALUENT 150 MG/ML ~~LOC~~ SOAJ: 150.0000 mg | SUBCUTANEOUS | 6 refills | Status: DC

## 2021-12-19 MED ORDER — LOSARTAN POTASSIUM 25 MG PO TABS: 25.0000 mg | ORAL_TABLET | Freq: Every day | ORAL | 2 refills | Status: DC

## 2021-12-19 NOTE — Progress Notes (Signed)
Subjective:  Patient ID: Bobby Murillo, male    DOB: Sep 20, 1940  Age: 81 y.o. [name]: [id number]  CC: The primary encounter diagnosis was OSA (obstructive sleep apnea). Diagnoses of Need for immunization against influenza, Insomnia due to psychological stress, Hyperlipidemia LDL goal <100, and Left nephrolithiasis were also pertinent to this visit.   HPI Bobby Murillo presents for  Chief Complaint  Patient presents with   Follow-up    Sleep apnea   Reviewed the results of recent sleep study which was done on Sept 8 at [facility] and noted moderate  sleep apnea with AHI of 28 . He has evidence of untreated OSA : erythrocytosis, arrhythmias, and snoring/poor sleep.  He is willing to try CPAP   Insomnia:  using alprazolam.  Request for 90 day refill on alprazolam.  DENIED  HLD:  RAN OUT OF PRALUENT A FEW MONTHS AGO     the cost has been higher than his other medications $116 /2 MONTHS  Joint pain :  had an episode of left side back pain two weeks ago ,reminiscent of kidney stone.  Saw Dr Linus Orn .  CT scan ordered,  4 mm stone in bladder and hydroureter, hydronephrosis noted.  Pain has resolved,  he declines urology follow up     Outpatient Medications Prior to Visit  Medication Sig Dispense Refill   acetaminophen (TYLENOL) 325 MG tablet Take 650 mg by mouth every 6 (six) hours as needed for moderate pain.     ALPRAZolam (XANAX) 0.5 MG tablet TAKE 1 TABLET BY MOUTH AT BEDTIME AS NEEDED FOR SLEEP 30 tablet 5   aspirin EC 81 MG tablet Take 81 mg by mouth daily. Swallow whole.     clopidogrel (PLAVIX) 75 MG tablet Take 1 tablet (75 mg total) by mouth daily. 30 tablet 6   Collagen-Vitamin C-Biotin (COLLAGEN 1500/C PO) Take by mouth.     ezetimibe (ZETIA) 10 MG tablet Take 1 tablet by mouth once daily 90 tablet 0   fluticasone (FLONASE) 50 MCG/ACT nasal spray Place 2 sprays into both nostrils daily. 16 g 6   Glucosamine-Chondroitin (GLUCOSAMINE CHONDR COMPLEX PO) Take 1 tablet by mouth daily. tab by  mouth daily     polycarbophil (FIBERCON) 625 MG tablet Take 625 mg by mouth daily.     Alirocumab (PRALUENT) 150 MG/ML SOAJ Inject 150 mg into the skin every 14 (fourteen) days. 2 mL 6   aspirin 325 MG tablet Take 325 mg by mouth daily. (Patient not taking: Reported on 12/19/2021)     losartan (COZAAR) 25 MG tablet Take 1 tablet (25 mg total) by mouth daily. 90 tablet 1   No facility-administered medications prior to visit.    Review of Systems;  Patient denies headache, fevers, malaise, unintentional weight loss, skin rash, eye pain, sinus congestion and sinus pain, sore throat, dysphagia,  hemoptysis , cough, dyspnea, wheezing, chest pain, palpitations, orthopnea, edema, abdominal pain, nausea, melena, diarrhea, constipation, flank pain, dysuria, hematuria, urinary  Frequency, nocturia, numbness, tingling, seizures,  Focal weakness, Loss of consciousness,  Tremor, insomnia, depression, anxiety, and suicidal ideation.      Objective:  BP 122/80 (BP Location: Left Arm, Patient Position: Sitting, Cuff Size: Normal)   Pulse (!) 47   Temp 97.6 F (36.4 C) (Oral)   Ht 6' (1.829 m)   Wt 201 lb 6.4 oz (91.4 kg)   SpO2 97%   BMI 27.31 kg/m   BP Readings from Last 3 Encounters:  12/19/21 122/80  12/09/21 118/70  11/20/21 (!) 156/80    Wt Readings from Last 3 Encounters:  12/19/21 201 lb 6.4 oz (91.4 kg)  12/09/21 207 lb 9.6 oz (94.2 kg)  11/20/21 211 lb 3.2 oz (95.8 kg)    General appearance: alert, cooperative and appears stated age Ears: normal TM's and external ear canals both ears Throat: lips, mucosa, and tongue normal; teeth and gums normal Neck: no adenopathy, no carotid bruit, supple, symmetrical, trachea midline and thyroid not enlarged, symmetric, no tenderness/mass/nodules Back: symmetric, no curvature. ROM normal. No CVA tenderness. Lungs: clear to auscultation bilaterally Heart: regular rate and rhythm, S1, S2 normal, no murmur, click, rub or gallop Abdomen: soft,  non-tender; bowel sounds normal; no masses,  no organomegaly Pulses: 2+ and symmetric Skin: Skin color, texture, turgor normal. No rashes or lesions Lymph nodes: Cervical, supraclavicular, and axillary nodes normal. Neuro:  awake and interactive with normal mood and affect. Higher cortical functions are normal. Speech is clear without word-finding difficulty or dysarthria. Extraocular movements are intact. Visual fields of both eyes are grossly intact. Sensation to light touch is grossly intact bilaterally of upper and lower extremities. Motor examination shows 4+/5 symmetric hand grip and upper extremity and 5/5 lower extremity strength. There is no pronation or drift. Gait is non-ataxic   Lab Results  Component Value Date   HGBA1C 6.2 05/27/2021   HGBA1C 6.0 (H) 01/03/2021   HGBA1C 5.8 06/09/2019    Lab Results  Component Value Date   CREATININE 1.50 (H) 11/12/2021   CREATININE 1.26 09/15/2021   CREATININE 1.42 05/27/2021    Lab Results  Component Value Date   WBC 6.0 09/15/2021   HGB 17.4 (H) 09/15/2021   HCT 51.9 09/15/2021   PLT 245.0 09/15/2021   GLUCOSE 94 09/15/2021   CHOL 118 09/15/2021   TRIG 93.0 09/15/2021   HDL 60.00 09/15/2021   LDLDIRECT 184.0 12/30/2015   LDLCALC 39 09/15/2021   ALT 28 09/15/2021   AST 22 09/15/2021   NA 138 09/15/2021   K 4.4 09/15/2021   CL 106 09/15/2021   CREATININE 1.50 (H) 11/12/2021   BUN 23 09/15/2021   CO2 23 09/15/2021   TSH 4.23 05/27/2021   PSA 0.00 (L) 05/27/2021   INR 1.0 02/22/2021   HGBA1C 6.2 05/27/2021   MICROALBUR <0.7 05/27/2021    CT ABDOMEN PELVIS WO CONTRAST  Result Date: 12/18/2021 CLINICAL DATA:  Gross hematuria lower pain and flank pain EXAM: CT ABDOMEN AND PELVIS WITHOUT CONTRAST TECHNIQUE: Multidetector CT imaging of the abdomen and pelvis was performed following the standard protocol without IV contrast. RADIATION DOSE REDUCTION: This exam was performed according to the departmental dose-optimization  program which includes automated exposure control, adjustment of the mA and/or kV according to patient size and/or use of iterative reconstruction technique. COMPARISON:  CT 09/05/2018 FINDINGS: Lower chest: Lung bases demonstrate no acute airspace disease or effusion. Hepatobiliary: No focal liver abnormality is seen. No gallstones, gallbladder wall thickening, or biliary dilatation. Pancreas: Unremarkable. No pancreatic ductal dilatation or surrounding inflammatory changes. Spleen: Normal in size without focal abnormality. Adrenals/Urinary Tract: Adrenal glands are within normal limits. Moderate left hydronephrosis and mild hydroureter with perinephric stranding. 4 mm calcification within the left inferior bladder, series 2, image 88. Stomach/Bowel: Stomach is within normal limits. Appendix appears normal. No evidence of bowel wall thickening, distention, or inflammatory changes. Vascular/Lymphatic: Mild aortic atherosclerosis. No aneurysm. No suspicious lymph nodes Reproductive: Prostatectomy Other: Negative for pelvic effusion or free air. Musculoskeletal: Chronic bilateral pars defect at L5. No acute  or suspicious osseous abnormality. IMPRESSION: 1. Moderate left hydronephrosis and hydroureter. There is a 4 mm calcification within the left inferior bladder, the findings are suggestive of recently passed kidney stone 2. Otherwise no CT evidence for acute intra-abdominal or pelvic abnormality. Electronically Signed   By: Donavan Foil M.D.   On: 12/18/2021 16:04    Assessment & Plan:   Problem List Items Addressed This Visit     Insomnia due to psychological stress    Managed with alprazolam.  The risks and benefits of benzodiazepine use were  Reviewed  with patient today including excessive sedation leading to respiratory depression,  impaired thinking/driving, and addiction.  Patient was advised to avoid concurrent use with alcohol, to use medication only as needed and not to share with others  .        OSA (obstructive sleep apnea) - Primary    Diagnosed by sleep study Sept 2023 .  He has  multiple symptoms /signs of untreated OSA.  Auto titrating mask ordered       Relevant Orders   For home use only DME continuous positive airway pressure (CPAP)   Left nephrolithiasis    With recent episode confirmed by CT .  His pain has resolved .  Advised to increase his water intake and supplement with citric acid       Hyperlipidemia LDL goal <100 (Chronic)    Resume Praluent for management       Relevant Medications   Alirocumab (PRALUENT) 150 MG/ML SOAJ   losartan (COZAAR) 25 MG tablet   Other Visit Diagnoses     Need for immunization against influenza       Relevant Orders   Flu Vaccine QUAD High Dose(Fluad) (Completed)       I spent a total of  30 minutes with this patient in a face to face visit on the date of this encounter reviewing the last office visit with Dr Aundra Dubin , his recent sleep study,  recent  imaging studies ,  and post visit ordering of testing and therapeutics.    Follow-up: Return in about 6 months (around 06/20/2022).   Crecencio Mc, MD

## 2021-12-19 NOTE — Patient Instructions (Addendum)
Your CPAP has been ordered via [facility] .Marland Kitchen  They will contact you      IF YOU NEED ADDITIONAL RELAXATION :  You might want to try using Relaxium for insomnia  (as seen on TV commercials) . It is available through [employer] and contains all natural supplements:  Melatonin 5 mg  Chamomile 25 mg Passionflower extract 75 mg GABA 100 mg Ashwaganda extract 125 mg Magnesium citrate, glycinate, oxide (100 mg)  L tryptophan 500 mg Valerest (proprietary  ingredient ; probably valeria root extract)

## 2021-12-21 DIAGNOSIS — N200 Calculus of kidney: Secondary | ICD-10-CM | POA: Insufficient documentation

## 2021-12-21 NOTE — Assessment & Plan Note (Signed)
Resume Praluent for management

## 2021-12-21 NOTE — Assessment & Plan Note (Signed)
Diagnosed by sleep study Sept 2023 .  He has  multiple symptoms /signs of untreated OSA.  Auto titrating mask ordered

## 2021-12-21 NOTE — Assessment & Plan Note (Addendum)
With recent episode confirmed by CT .  His pain has resolved .  Advised to increase his water intake and supplement with citric acid

## 2021-12-21 NOTE — Assessment & Plan Note (Signed)
Managed with alprazolam.  The risks and benefits of benzodiazepine use were  Reviewed  with patient today including excessive sedation leading to respiratory depression,  impaired thinking/driving, and addiction.  Patient was advised to avoid concurrent use with alcohol, to use medication only as needed and not to share with others  .

## 2021-12-26 DIAGNOSIS — G4733 Obstructive sleep apnea (adult) (pediatric): Secondary | ICD-10-CM | POA: Diagnosis not present

## 2021-12-30 ENCOUNTER — Other Ambulatory Visit: Payer: Self-pay

## 2021-12-30 ENCOUNTER — Inpatient Hospital Stay
Admission: RE | Admit: 2021-12-30 | Discharge: 2021-12-31 | DRG: 983 | Disposition: A | Discharge status: Home / Self Care | Payer: Medicare Other | Attending: Vascular Surgery | Admitting: Vascular Surgery

## 2021-12-30 ENCOUNTER — Encounter: Payer: Self-pay | Admitting: Vascular Surgery

## 2021-12-30 ENCOUNTER — Encounter
Admission: RE | Disposition: A | Discharge status: Home / Self Care | Payer: Self-pay | Source: Home / Self Care | Attending: Vascular Surgery

## 2021-12-30 DIAGNOSIS — I6522 Occlusion and stenosis of left carotid artery: Secondary | ICD-10-CM | POA: Diagnosis not present

## 2021-12-30 DIAGNOSIS — Z823 Family history of stroke: Secondary | ICD-10-CM

## 2021-12-30 DIAGNOSIS — I700 Atherosclerosis of aorta: Secondary | ICD-10-CM | POA: Diagnosis not present

## 2021-12-30 DIAGNOSIS — Z9842 Cataract extraction status, left eye: Secondary | ICD-10-CM

## 2021-12-30 DIAGNOSIS — Z8673 Personal history of transient ischemic attack (TIA), and cerebral infarction without residual deficits: Secondary | ICD-10-CM

## 2021-12-30 DIAGNOSIS — Z8042 Family history of malignant neoplasm of prostate: Secondary | ICD-10-CM

## 2021-12-30 DIAGNOSIS — I129 Hypertensive chronic kidney disease with stage 1 through stage 4 chronic kidney disease, or unspecified chronic kidney disease: Secondary | ICD-10-CM | POA: Diagnosis present

## 2021-12-30 DIAGNOSIS — Z79899 Other long term (current) drug therapy: Secondary | ICD-10-CM

## 2021-12-30 DIAGNOSIS — Z8249 Family history of ischemic heart disease and other diseases of the circulatory system: Secondary | ICD-10-CM | POA: Diagnosis not present

## 2021-12-30 DIAGNOSIS — N1831 Chronic kidney disease, stage 3a: Secondary | ICD-10-CM | POA: Diagnosis not present

## 2021-12-30 DIAGNOSIS — Z7982 Long term (current) use of aspirin: Secondary | ICD-10-CM | POA: Diagnosis not present

## 2021-12-30 DIAGNOSIS — Z9841 Cataract extraction status, right eye: Secondary | ICD-10-CM

## 2021-12-30 DIAGNOSIS — Z820 Family history of epilepsy and other diseases of the nervous system: Secondary | ICD-10-CM | POA: Diagnosis not present

## 2021-12-30 DIAGNOSIS — Z888 Allergy status to other drugs, medicaments and biological substances status: Secondary | ICD-10-CM

## 2021-12-30 DIAGNOSIS — I639 Cerebral infarction, unspecified: Secondary | ICD-10-CM

## 2021-12-30 DIAGNOSIS — Z7902 Long term (current) use of antithrombotics/antiplatelets: Secondary | ICD-10-CM | POA: Diagnosis not present

## 2021-12-30 DIAGNOSIS — I2510 Atherosclerotic heart disease of native coronary artery without angina pectoris: Secondary | ICD-10-CM | POA: Diagnosis not present

## 2021-12-30 DIAGNOSIS — Z8546 Personal history of malignant neoplasm of prostate: Secondary | ICD-10-CM | POA: Diagnosis not present

## 2021-12-30 DIAGNOSIS — Z85828 Personal history of other malignant neoplasm of skin: Secondary | ICD-10-CM | POA: Diagnosis not present

## 2021-12-30 DIAGNOSIS — F419 Anxiety disorder, unspecified: Secondary | ICD-10-CM | POA: Diagnosis present

## 2021-12-30 DIAGNOSIS — E785 Hyperlipidemia, unspecified: Secondary | ICD-10-CM | POA: Diagnosis present

## 2021-12-30 HISTORY — PX: CAROTID PTA/STENT INTERVENTION: CATH118231

## 2021-12-30 LAB — POCT ACTIVATED CLOTTING TIME: Activated Clotting Time: 311 seconds

## 2021-12-30 LAB — CREATININE, SERUM
Creatinine, Ser: 1.53 mg/dL — ABNORMAL HIGH (ref 0.61–1.24)
GFR, Estimated: 46 mL/min — ABNORMAL LOW (ref >60)

## 2021-12-30 LAB — MRSA NEXT GEN BY PCR, NASAL: MRSA by PCR Next Gen: NOT DETECTED

## 2021-12-30 LAB — BUN: BUN: 24 mg/dL — ABNORMAL HIGH (ref 8–23)

## 2021-12-30 LAB — GLUCOSE, CAPILLARY: Glucose-Capillary: 93 mg/dL (ref 70–99)

## 2021-12-30 SURGERY — CAROTID PTA/STENT INTERVENTION
Anesthesia: Moderate Sedation | Laterality: Left

## 2021-12-30 MED ORDER — ONDANSETRON HCL 4 MG/2ML IJ SOLN
4.0000 mg | Freq: Four times a day (QID) | INTRAMUSCULAR | Status: DC | PRN
Start: 2021-12-30 — End: 2021-12-30

## 2021-12-30 MED ORDER — CEFAZOLIN SODIUM-DEXTROSE 2-4 GM/100ML-% IV SOLN
2.0000 g | Freq: Three times a day (TID) | INTRAVENOUS | Status: AC
  Administered 2021-12-30 – 2021-12-31 (×2): 2 g via INTRAVENOUS
  Filled 2021-12-30 (×2): qty 100

## 2021-12-30 MED ORDER — CEFAZOLIN SODIUM-DEXTROSE 2-4 GM/100ML-% IV SOLN
INTRAVENOUS | Status: AC
  Filled 2021-12-30: qty 100

## 2021-12-30 MED ORDER — SODIUM CHLORIDE 0.9 % IV SOLN: INTRAVENOUS | Status: DC

## 2021-12-30 MED ORDER — ONDANSETRON HCL 4 MG/2ML IJ SOLN: 4.0000 mg | Freq: Four times a day (QID) | INTRAMUSCULAR | Status: DC | PRN

## 2021-12-30 MED ORDER — CALCIUM POLYCARBOPHIL 625 MG PO TABS
625.0000 mg | ORAL_TABLET | Freq: Every day | ORAL | Status: DC
  Filled 2021-12-30: qty 1

## 2021-12-30 MED ORDER — CEFAZOLIN SODIUM-DEXTROSE 1-4 GM/50ML-% IV SOLN
INTRAVENOUS | Status: AC | PRN
  Administered 2021-12-30: 2 g via INTRAVENOUS

## 2021-12-30 MED ORDER — ASPIRIN 81 MG PO TBEC
81.0000 mg | DELAYED_RELEASE_TABLET | Freq: Every day | ORAL | Status: DC
  Administered 2021-12-31: 81 mg via ORAL
  Filled 2021-12-30: qty 1

## 2021-12-30 MED ORDER — ACETAMINOPHEN 325 MG PO TABS: 325.0000 mg | ORAL_TABLET | ORAL | Status: DC | PRN

## 2021-12-30 MED ORDER — METHYLPREDNISOLONE SODIUM SUCC 125 MG IJ SOLR
125.0000 mg | Freq: Once | INTRAMUSCULAR | Status: DC | PRN
Start: 2021-12-30 — End: 2021-12-30

## 2021-12-30 MED ORDER — OXYCODONE HCL 5 MG PO TABS: 5.0000 mg | ORAL_TABLET | ORAL | Status: DC | PRN

## 2021-12-30 MED ORDER — HEPARIN SODIUM (PORCINE) 1000 UNIT/ML IJ SOLN
INTRAMUSCULAR | Status: DC | PRN
  Administered 2021-12-30: 10000 [IU] via INTRAVENOUS

## 2021-12-30 MED ORDER — ACETAMINOPHEN 325 MG RE SUPP: 325.0000 mg | RECTAL | Status: DC | PRN

## 2021-12-30 MED ORDER — MIDAZOLAM HCL 2 MG/2ML IJ SOLN
INTRAMUSCULAR | Status: DC | PRN
  Administered 2021-12-30 (×2): 1 mg via INTRAVENOUS

## 2021-12-30 MED ORDER — HYDROMORPHONE HCL 1 MG/ML IJ SOLN: 1.0000 mg | Freq: Once | INTRAMUSCULAR | Status: DC | PRN

## 2021-12-30 MED ORDER — FENTANYL CITRATE (PF) 100 MCG/2ML IJ SOLN
INTRAMUSCULAR | Status: DC | PRN
  Administered 2021-12-30: 50 ug via INTRAVENOUS

## 2021-12-30 MED ORDER — FAMOTIDINE 20 MG PO TABS: 40.0000 mg | ORAL_TABLET | Freq: Once | ORAL | Status: DC | PRN

## 2021-12-30 MED ORDER — ATROPINE SULFATE 1 MG/10ML IJ SOSY
PREFILLED_SYRINGE | INTRAMUSCULAR | Status: DC | PRN
  Administered 2021-12-30: 1 mg via INTRAVENOUS

## 2021-12-30 MED ORDER — IODIXANOL 320 MG/ML IV SOLN
INTRAVENOUS | Status: DC | PRN
  Administered 2021-12-30: 50 mL

## 2021-12-30 MED ORDER — DOCUSATE SODIUM 100 MG PO CAPS: 100.0000 mg | ORAL_CAPSULE | Freq: Every day | ORAL | Status: DC

## 2021-12-30 MED ORDER — ALPRAZOLAM 0.5 MG PO TABS
0.5000 mg | ORAL_TABLET | Freq: Every evening | ORAL | Status: DC | PRN
  Administered 2021-12-30: 0.5 mg via ORAL
  Filled 2021-12-30: qty 1

## 2021-12-30 MED ORDER — LOSARTAN POTASSIUM 50 MG PO TABS
25.0000 mg | ORAL_TABLET | Freq: Every day | ORAL | Status: DC
  Administered 2021-12-30: 25 mg via ORAL
  Filled 2021-12-30: qty 1

## 2021-12-30 MED ORDER — CEFAZOLIN SODIUM-DEXTROSE 2-4 GM/100ML-% IV SOLN: 2.0000 g | INTRAVENOUS | Status: DC

## 2021-12-30 MED ORDER — MORPHINE SULFATE (PF) 4 MG/ML IV SOLN: 2.0000 mg | INTRAVENOUS | Status: DC | PRN

## 2021-12-30 MED ORDER — ALUM & MAG HYDROXIDE-SIMETH 200-200-20 MG/5ML PO SUSP: 15.0000 mL | ORAL | Status: DC | PRN

## 2021-12-30 MED ORDER — CLOPIDOGREL BISULFATE 75 MG PO TABS
75.0000 mg | ORAL_TABLET | Freq: Every day | ORAL | Status: DC
  Administered 2021-12-31: 75 mg via ORAL
  Filled 2021-12-30: qty 1

## 2021-12-30 MED ORDER — HYDRALAZINE HCL 20 MG/ML IJ SOLN: 5.0000 mg | INTRAMUSCULAR | Status: DC | PRN

## 2021-12-30 MED ORDER — PANTOPRAZOLE SODIUM 40 MG IV SOLR
40.0000 mg | Freq: Every day | INTRAVENOUS | Status: DC
  Filled 2021-12-30: qty 10

## 2021-12-30 MED ORDER — SODIUM CHLORIDE 0.9 % IV SOLN
INTRAVENOUS | Status: AC | PRN
  Administered 2021-12-30: 100 mL/h via INTRAVENOUS

## 2021-12-30 MED ORDER — LABETALOL HCL 5 MG/ML IV SOLN: 10.0000 mg | INTRAVENOUS | Status: DC | PRN

## 2021-12-30 MED ORDER — MIDAZOLAM HCL 2 MG/ML PO SYRP: 8.0000 mg | ORAL_SOLUTION | Freq: Once | ORAL | Status: DC | PRN

## 2021-12-30 MED ORDER — PHENOL 1.4 % MT LIQD: 1.0000 | OROMUCOSAL | Status: DC | PRN

## 2021-12-30 MED ORDER — KCL IN DEXTROSE-NACL 20-5-0.9 MEQ/L-%-% IV SOLN: INTRAVENOUS | Status: DC

## 2021-12-30 MED ORDER — CHLORHEXIDINE GLUCONATE CLOTH 2 % EX PADS: 6.0000 | MEDICATED_PAD | Freq: Every day | CUTANEOUS | Status: DC

## 2021-12-30 MED ORDER — EZETIMIBE 10 MG PO TABS
10.0000 mg | ORAL_TABLET | Freq: Every day | ORAL | Status: DC
  Administered 2021-12-30: 10 mg via ORAL
  Filled 2021-12-30: qty 1

## 2021-12-30 MED ORDER — METOPROLOL TARTRATE 5 MG/5ML IV SOLN: 2.0000 mg | INTRAVENOUS | Status: DC | PRN

## 2021-12-30 MED ORDER — SODIUM CHLORIDE 0.9 % IV SOLN: 500.0000 mL | Freq: Once | INTRAVENOUS | Status: DC | PRN

## 2021-12-30 MED ORDER — GUAIFENESIN-DM 100-10 MG/5ML PO SYRP: 15.0000 mL | ORAL_SOLUTION | ORAL | Status: DC | PRN

## 2021-12-30 MED ORDER — DIPHENHYDRAMINE HCL 50 MG/ML IJ SOLN: 50.0000 mg | Freq: Once | INTRAMUSCULAR | Status: DC | PRN

## 2021-12-30 SURGICAL SUPPLY — 22 items
BALLN VIATRAC 4X20X135 (BALLOONS) ×1
BALLOON VIATRAC 4X20X135 (BALLOONS) IMPLANT
CATH ANGIO 5F PIGTAIL 100CM (CATHETERS) IMPLANT
CATH HEADHUNTER H1 5F 100CM (CATHETERS) IMPLANT
COVER DRAPE FLUORO 36X44 (DRAPES) IMPLANT
DEVICE EMBOSHIELD NAV6 4.0-7.0 (FILTER) IMPLANT
DEVICE SAFEGUARD 24CM (GAUZE/BANDAGES/DRESSINGS) IMPLANT
DEVICE STARCLOSE SE CLOSURE (Vascular Products) IMPLANT
DEVICE TORQUE .025-.038 (MISCELLANEOUS) IMPLANT
GLIDEWIRE ANGLED SS 035X260CM (WIRE) IMPLANT
GUIDEWIRE VASC STIFF .038X260 (WIRE) IMPLANT
KIT CAROTID MANIFOLD (MISCELLANEOUS) IMPLANT
KIT ENCORE 26 ADVANTAGE (KITS) IMPLANT
NDL ENTRY 21GA 7CM ECHOTIP (NEEDLE) IMPLANT
NEEDLE ENTRY 21GA 7CM ECHOTIP (NEEDLE) ×1 IMPLANT
PACK ANGIOGRAPHY (CUSTOM PROCEDURE TRAY) ×1 IMPLANT
SET INTRO CAPELLA COAXIAL (SET/KITS/TRAYS/PACK) IMPLANT
SHEATH BRITE TIP 6FRX11 (SHEATH) IMPLANT
SHEATH NEURON MAX 6FR 80CM (SHEATH) IMPLANT
STENT XACT CAR 8-6X30X136 (Permanent Stent) IMPLANT
SYR MEDRAD MARK 7 150ML (SYRINGE) IMPLANT
WIRE GUIDERIGHT .035X150 (WIRE) IMPLANT

## 2021-12-30 NOTE — Interval H&P Note (Signed)
History and Physical Interval Note:  12/30/2021 8:48 AM  Bobby Murillo  has presented today for surgery, with the diagnosis of L Carotid Stent   ABBOTT    Carotid arrtery stenosis.  The various methods of treatment have been discussed with the patient and family. After consideration of risks, benefits and other options for treatment, the patient has consented to  Procedure(s): CAROTID PTA/STENT INTERVENTION (Left) as a surgical intervention.  The patient's history has been reviewed, patient examined, no change in status, stable for surgery.  I have reviewed the patient's chart and labs.  Questions were answered to the patient's satisfaction.     Hortencia Pilar

## 2021-12-30 NOTE — Op Note (Signed)
OPERATIVE NOTE DATE: 12/30/2021  PROCEDURE:  Ultrasound guidance for vascular access right femoral artery  Placement of a 8 mm by 6 mm x 30 mm exact stent with the use of the NAV-6 embolic protection device in the left carotid artery  PRE-OPERATIVE DIAGNOSIS: 1.  Greater than 90% left internal carotid artery stenosis. 2.  CVA left hemispheric  POST-OPERATIVE DIAGNOSIS:  Same as above  SURGEON: Katha Cabal,, MD  ASSISTANT(S): None  ANESTHESIA: local/MCS  ESTIMATED BLOOD LOSS: 30 cc  CONTRAST: 50 cc  FLUORO TIME: 6.2 minutes  MODERATE CONSCIOUS SEDATION TIME: Continuous ECG pulse oximetry and cardiopulmonary monitoring was performed that entire procedure by the interventional radiology nurse total sedation time was 50 minutes and 44 seconds.    FINDING(S): 1.   Greater than 90% left carotid artery stenosis  SPECIMEN(S):   none  INDICATIONS:   Patient is a 81 y.o. male who presents with critical left carotid artery stenosis.  The patient has a high bifurcation and severe cardiac comorbidities and carotid artery stenting was felt to be preferred to endarterectomy for that reason.  Risks and benefits were discussed and informed consent was obtained.   DESCRIPTION: After obtaining full informed written consent, the patient was brought back to the vascular suite and placed supine upon the table.  The patient received IV antibiotics prior to induction. Moderate conscious sedation was administered during a face to face encounter with the patient throughout the procedure with my supervision of the RN administering medicines and monitoring the patients vital signs and mental status throughout from the start of the procedure until the patient was taken to the recovery room.  After obtaining adequate anesthesia, the patient was prepped and draped in the standard fashion.   The right femoral artery was visualized with ultrasound and found to be widely patent. It was then accessed  under direct ultrasound guidance without difficulty with a Seldinger needle. A permanent image was recorded. A J-wire was placed and we then placed a 6 French sheath. The patient was then heparinized and a total of 10,000 units of intravenous heparin were given and an ACT was checked to confirm successful anticoagulation. A pigtail catheter was then placed into the ascending aorta. This showed type I arch with the ostia of the great vessels widely patent. I then selectively cannulated the left common carotid without difficulty with a H1 catheter and advanced into the mid left common carotid artery.  Cervical and cerebral carotid angiography was then performed. There were no obvious intracranial filling defects with greater than 90% stenosis at the origin of the left internal carotid artery significant disease extended into the bulb as well. The carotid bifurcation demonstrated greater than 90% stenosis at the origin of left internal carotid artery.  I then advanced into the external carotid artery with a Glidewire and the H1 catheter and then exchanged for the Amplatz Super Stiff wire. Over the Amplatz Super Stiff wire, a 6 [place] shuttle sheath was placed into the mid common carotid artery. I then used the NAV-6  Embolic protection device and crossed the lesion and parked this in the distal internal carotid artery at the base of the skull.  I then selected a 8 mm x 6 mm x 30 mm exact stent. This was deployed across the lesion encompassing it in its entirety. A 4 mm x 20 mm length balloon was used to post dilate the stent. Only about a 5% residual stenosis was present after angioplasty. Completion angiogram showed normal  intracranial filling without new defects. At this point I elected to terminate the procedure. The sheath was removed and StarClose closure device was deployed in the right femoral artery with excellent hemostatic result. The patient was taken to the recovery room in stable condition having tolerated  the procedure well.  COMPLICATIONS: none  CONDITION: stable  Hortencia Pilar 12/30/2021 9:52 AM   This note was created with Dragon Medical transcription system. Any errors in dictation are purely unintentional.

## 2021-12-30 NOTE — H&P (View-Only) (Signed)
[name] : [id number]  Bobby Murillo is a 81 y.o. ([birth year]) male who presents with chief complaint of check carotid arteries.  History of Present Illness:  The patient presents to [facility] today for placement of a left carotid stent.  He was recently seen for follow up evaluation of carotid stenosis status post CT angiogram. CT scan was done 11/12/2021. Patient reports that the test went well with no problems or complications.    The patient denies interval amaurosis fugax. There is no recent or interval TIA symptoms or focal motor deficits. There is no prior documented CVA.   The patient is taking enteric-coated aspirin 81 mg daily.   There is no history of migraine headaches. There is no history of seizures.   No recent shortening of the patient's walking distance or new symptoms consistent with claudication.  No history of rest pain symptoms. No new ulcers or wounds of the lower extremities have occurred.   There is no history of DVT, PE or superficial thrombophlebitis. No recent episodes of angina or shortness of breath documented.    CT angiogram is reviewed by me personally and shows >80% stenosis consistent with calcified plaque at the origin of the left internal carotid artery.    Current Meds  Medication Sig   acetaminophen (TYLENOL) 325 MG tablet Take 650 mg by mouth every 6 (six) hours as needed for moderate pain.   ALPRAZolam (XANAX) 0.5 MG tablet TAKE 1 TABLET BY MOUTH AT BEDTIME AS NEEDED FOR SLEEP   aspirin EC 81 MG tablet Take 81 mg by mouth daily. Swallow whole.   ezetimibe (ZETIA) 10 MG tablet Take 1 tablet by mouth once daily   Glucosamine-Chondroitin (GLUCOSAMINE CHONDR COMPLEX PO) Take 1 tablet by mouth daily. tab by mouth daily   losartan (COZAAR) 25 MG tablet Take 1 tablet (25 mg total) by mouth daily.    Past Medical History:  Diagnosis Date   Abnormal LFTs    Acute cerebrovascular accident (CVA) due to thrombosis of left middle  cerebral artery ([place]) 01/05/2021   Anxiety    Aortic atherosclerosis ([facility])    Arthritis    Basal cell carcinoma 09/14/2010   left calf    Bilateral carotid artery stenosis    a.) Carotid doppler 83/41/9622: 29-79% LICA; 8-92% RICA. b.) CTA neck 01/30/2021: critical stenosis LEFT carotid bulb.   Carotid stenosis, symptomatic, with infarction ([place]) 01/20/2021   CKD (chronic kidney disease), stage III ([place])    History of kidney stones    Hyperlipidemia    Hypertension    Long term current use of antithrombotics/antiplatelets    a.) DAPT therapy (ASA + clopidogrel)   Prostate cancer ([place]) 03/16/2008   Dr. Ronny Bacon   Sinus bradycardia by electrocardiogram    Stroke ([place]) 01/03/2021   a.) Acute infarction affecting the cortex. ?? incidental punctate acute white matter infarction just posterior to the atrium of the left lateral ventricle. Stenosis of the right M2 branch inferior division. Severe atherosclerotic disease of both PCAs with severe stenoses.    Past Surgical History:  Procedure Laterality Date   CATARACT EXTRACTION Bilateral    05/2021   COLONOSCOPY     ENDARTERECTOMY Left 02/19/2021   Procedure: ENDARTERECTOMY CAROTID;  Surgeon: Katha Cabal, MD;  Location: [facility] ORS;  Service: Vascular;  Laterality: Left;   PROSTATECTOMY  11/14/2008   transurethral, radical, Dr. Darcus Austin    Social History Social History   Tobacco Use   Smoking status:  Never   Smokeless tobacco: Never  Vaping Use   Vaping Use: Never used  Substance Use Topics   Alcohol use: Yes    Alcohol/week: 8.0 standard drinks of alcohol    Types: 4 Glasses of wine, 4 Shots of liquor per week    Comment: occasional   Drug use: No    Family History Family History  Problem Relation Age of Onset   Alzheimer's disease Mother    Stroke Father    Prostate cancer Father    Alzheimer's disease Sister    Stroke Paternal Aunt    Heart disease Paternal Uncle 83    Allergies  Allergen Reactions   Alfuzosin Other  (See Comments)    Other Reaction: fainting spell   Pravastatin Sodium     Other reaction(s): Muscle Pain     REVIEW OF SYSTEMS (Negative unless checked)  Constitutional: '[]'$ Weight loss  '[]'$ Fever  '[]'$ Chills Cardiac: '[]'$ Chest pain   '[]'$ Chest pressure   '[]'$ Palpitations   '[]'$ Shortness of breath when laying flat   '[]'$ Shortness of breath with exertion. Vascular:  '[x]'$ Pain in legs with walking   '[]'$ Pain in legs at rest  '[]'$ History of DVT   '[]'$ Phlebitis   '[]'$ Swelling in legs   '[]'$ Varicose veins   '[]'$ Non-healing ulcers Pulmonary:   '[]'$ Uses home oxygen   '[]'$ Productive cough   '[]'$ Hemoptysis   '[]'$ Wheeze  '[]'$ COPD   '[]'$ Asthma Neurologic:  '[]'$ Dizziness   '[]'$ Seizures   '[x]'$ History of stroke   '[]'$ History of TIA  '[]'$ Aphasia   '[]'$ Vissual changes   '[]'$ Weakness or numbness in arm   '[]'$ Weakness or numbness in leg Musculoskeletal:   '[]'$ Joint swelling   '[x]'$ Joint pain   '[]'$ Low back pain Hematologic:  '[]'$ Easy bruising  '[]'$ Easy bleeding   '[]'$ Hypercoagulable state   '[]'$ Anemic Gastrointestinal:  '[]'$ Diarrhea   '[]'$ Vomiting  '[]'$ Gastroesophageal reflux/heartburn   '[]'$ Difficulty swallowing. Genitourinary:  '[]'$ Chronic kidney disease   '[]'$ Difficult urination  '[]'$ Frequent urination   '[]'$ Blood in urine Skin:  '[]'$ Rashes   '[]'$ Ulcers  Psychological:  '[]'$ History of anxiety   '[]'$  History of major depression.  Physical Examination  Vitals:   12/30/21 0730  BP: (!) 159/80  Pulse: 64  Resp: 18  TempSrc: Oral  SpO2: 98%  Weight: 91.6 kg  Height: 6' (1.829 m)   Body mass index is 27.4 kg/m. Gen: WD/WN, NAD Head: [place]/AT, No temporalis wasting.  Ear/Nose/Throat: Hearing grossly intact, nares w/o erythema or drainage Eyes: PER, EOMI, sclera nonicteric.  Neck: Supple, no masses.  No bruit or JVD.  Pulmonary:  Good air movement, no audible wheezing, no use of accessory muscles.  Cardiac: RRR, normal S1, S2, no Murmurs. Vascular:  carotid bruit noted Vessel Right Left  Radial Palpable Palpable  Carotid  Palpable  Palpable  Subclav  Palpable Palpable  Gastrointestinal:  soft, non-distended. No guarding/no peritoneal signs.  Musculoskeletal: M/S 5/5 throughout.  No visible deformity.  Neurologic: CN 2-12 intact. Pain and light touch intact in extremities.  Symmetrical.  Speech is fluent. Motor exam as listed above. Psychiatric: Judgment intact, Mood & affect appropriate for pt's clinical situation. Dermatologic: No rashes or ulcers noted.  No changes consistent with cellulitis.   CBC Lab Results  Component Value Date   WBC 6.0 09/15/2021   HGB 17.4 (H) 09/15/2021   HCT 51.9 09/15/2021   MCV 91.4 09/15/2021   PLT 245.0 09/15/2021    BMET    Component Value Date/Time   NA 138 09/15/2021 0920   NA 140 10/23/2011 1142   K 4.4 09/15/2021 0920   CL  106 09/15/2021 0920   CO2 23 09/15/2021 0920   GLUCOSE 94 09/15/2021 0920   BUN 24 (H) 12/30/2021 0729   BUN 20 10/23/2011 1142   CREATININE 1.53 (H) 12/30/2021 0729   CREATININE 1.26 (H) 12/22/2019 1508   CALCIUM 9.4 09/15/2021 0920   GFRNONAA 46 (L) 12/30/2021 0729   GFRAA 69 10/23/2011 1142   Estimated Creatinine Clearance: 43 mL/min (A) (by C-G formula based on SCr of 1.53 mg/dL (H)).  COAG Lab Results  Component Value Date   INR 1.0 02/22/2021   INR 1.0 01/03/2021    Radiology CT ABDOMEN PELVIS WO CONTRAST  Result Date: 12/18/2021 CLINICAL DATA:  Gross hematuria lower pain and flank pain EXAM: CT ABDOMEN AND PELVIS WITHOUT CONTRAST TECHNIQUE: Multidetector CT imaging of the abdomen and pelvis was performed following the standard protocol without IV contrast. RADIATION DOSE REDUCTION: This exam was performed according to the departmental dose-optimization program which includes automated exposure control, adjustment of the mA and/or kV according to patient size and/or use of iterative reconstruction technique. COMPARISON:  CT 09/05/2018 FINDINGS: Lower chest: Lung bases demonstrate no acute airspace disease or effusion. Hepatobiliary: No focal liver abnormality is seen. No gallstones,  gallbladder wall thickening, or biliary dilatation. Pancreas: Unremarkable. No pancreatic ductal dilatation or surrounding inflammatory changes. Spleen: Normal in size without focal abnormality. Adrenals/Urinary Tract: Adrenal glands are within normal limits. Moderate left hydronephrosis and mild hydroureter with perinephric stranding. 4 mm calcification within the left inferior bladder, series 2, image 88. Stomach/Bowel: Stomach is within normal limits. Appendix appears normal. No evidence of bowel wall thickening, distention, or inflammatory changes. Vascular/Lymphatic: Mild aortic atherosclerosis. No aneurysm. No suspicious lymph nodes Reproductive: Prostatectomy Other: Negative for pelvic effusion or free air. Musculoskeletal: Chronic bilateral pars defect at L5. No acute or suspicious osseous abnormality. IMPRESSION: 1. Moderate left hydronephrosis and hydroureter. There is a 4 mm calcification within the left inferior bladder, the findings are suggestive of recently passed kidney stone 2. Otherwise no CT evidence for acute intra-abdominal or pelvic abnormality. Electronically Signed   By: Donavan Foil M.D.   On: 12/18/2021 16:04     Assessment/Plan 1. Carotid stenosis, symptomatic w/o infarct, left Recommend:   The patient is symptomatic with respect to the carotid stenosis.  The patient now has progressed and has a lesion the is >70%.   Patient's CT angiography of the carotid arteries confirms >70% left ICA stenosis.  The anatomical considerations support stenting over surgery.  This was discussed in detail with the patient.   The risks, benefits and alternative therapies were reviewed in detail with the patient.  All questions were answered.  The patient agrees to proceed with stenting of the left carotid artery.   Continue antiplatelet therapy as prescribed. Continue management of CAD, HTN and Hyperlipidemia. Healthy heart diet, encouraged exercise at least 4 times per week.     2.  Essential hypertension Continue antihypertensive medications as already ordered, these medications have been reviewed and there are no changes at this time.    3. Stage 3a chronic kidney disease ([place]) The patient has advanced renal disease.  However, at the present time the patient is not yet on dialysis.   Avoid nephrotoxic medications and dehydration.  Further plans per nephrology    4. Hyperlipidemia, unspecified hyperlipidemia type Continue statin as ordered and reviewed, no changes at this time    Hortencia Pilar, MD  12/30/2021 8:43 AM

## 2021-12-30 NOTE — Progress Notes (Addendum)
1100 Patient received from vascular lab via bed. Right groin PAD in place with 40 cc of air in bubble. Right groin shows no hematoma noted and area is soft with small amount of bloody drainage noted. Patient alert and oriented. NIH normal. 1310 Patient noted blood around his pad. Right femoral PAD Loose with drainage from right groin. Direct pressure head to right femoral site x 10 minutes. No oozing or bleeding noted. Tergaderm to right groin area. Checked every 5 minutes for 30 minutes. No bleeding or oozing noted. Patient calm throughout pressure hold. No complaints of pain. After 1420 PA in to check right groin. Injected with Lidocaine with epinephrine ( not sure how much,nurse off the floor) with bedrest orders until [id number] Pressure dressing in place. No oozing or bleeding noted. Patient resting quietly.No more bleeding in right groin.

## 2021-12-30 NOTE — Progress Notes (Signed)
[name] : [id number]  Bobby Murillo is a 81 y.o. (07-17-1940) male who presents with chief complaint of check carotid arteries.  History of Present Illness:  The patient presents to [facility] today for placement of a left carotid stent.  He was recently seen for follow up evaluation of carotid stenosis status post CT angiogram. CT scan was done 11/12/2021. Patient reports that the test went well with no problems or complications.    The patient denies interval amaurosis fugax. There is no recent or interval TIA symptoms or focal motor deficits. There is no prior documented CVA.   The patient is taking enteric-coated aspirin 81 mg daily.   There is no history of migraine headaches. There is no history of seizures.   No recent shortening of the patient's walking distance or new symptoms consistent with claudication.  No history of rest pain symptoms. No new ulcers or wounds of the lower extremities have occurred.   There is no history of DVT, PE or superficial thrombophlebitis. No recent episodes of angina or shortness of breath documented.    CT angiogram is reviewed by me personally and shows >80% stenosis consistent with calcified plaque at the origin of the left internal carotid artery.    Current Meds  Medication Sig   acetaminophen (TYLENOL) 325 MG tablet Take 650 mg by mouth every 6 (six) hours as needed for moderate pain.   ALPRAZolam (XANAX) 0.5 MG tablet TAKE 1 TABLET BY MOUTH AT BEDTIME AS NEEDED FOR SLEEP   aspirin EC 81 MG tablet Take 81 mg by mouth daily. Swallow whole.   ezetimibe (ZETIA) 10 MG tablet Take 1 tablet by mouth once daily   Glucosamine-Chondroitin (GLUCOSAMINE CHONDR COMPLEX PO) Take 1 tablet by mouth daily. tab by mouth daily   losartan (COZAAR) 25 MG tablet Take 1 tablet (25 mg total) by mouth daily.    Past Medical History:  Diagnosis Date   Abnormal LFTs    Acute cerebrovascular accident (CVA) due to thrombosis of left middle  cerebral artery ([place]) 01/05/2021   Anxiety    Aortic atherosclerosis (HCC)    Arthritis    Basal cell carcinoma 09/14/2010   left calf    Bilateral carotid artery stenosis    a.) Carotid doppler 89/16/9450: 38-88% LICA; 2-80% RICA. b.) CTA neck 01/30/2021: critical stenosis LEFT carotid bulb.   Carotid stenosis, symptomatic, with infarction ([place]) 01/20/2021   CKD (chronic kidney disease), stage III ([place])    History of kidney stones    Hyperlipidemia    Hypertension    Long term current use of antithrombotics/antiplatelets    a.) DAPT therapy (ASA + clopidogrel)   Prostate cancer ([place]) 03/16/2008   Dr. Ronny Bacon   Sinus bradycardia by electrocardiogram    Stroke ([place]) 01/03/2021   a.) Acute infarction affecting the cortex. ?? incidental punctate acute white matter infarction just posterior to the atrium of the left lateral ventricle. Stenosis of the right M2 branch inferior division. Severe atherosclerotic disease of both PCAs with severe stenoses.    Past Surgical History:  Procedure Laterality Date   CATARACT EXTRACTION Bilateral    05/2021   COLONOSCOPY     ENDARTERECTOMY Left 02/19/2021   Procedure: ENDARTERECTOMY CAROTID;  Surgeon: Katha Cabal, MD;  Location: [facility] ORS;  Service: Vascular;  Laterality: Left;   PROSTATECTOMY  11/14/2008   transurethral, radical, Dr. Darcus Austin    Social History Social History   Tobacco Use   Smoking status:  Never   Smokeless tobacco: Never  Vaping Use   Vaping Use: Never used  Substance Use Topics   Alcohol use: Yes    Alcohol/week: 8.0 standard drinks of alcohol    Types: 4 Glasses of wine, 4 Shots of liquor per week    Comment: occasional   Drug use: No    Family History Family History  Problem Relation Age of Onset   Alzheimer's disease Mother    Stroke Father    Prostate cancer Father    Alzheimer's disease Sister    Stroke Paternal Aunt    Heart disease Paternal Uncle 27    Allergies  Allergen Reactions   Alfuzosin Other  (See Comments)    Other Reaction: fainting spell   Pravastatin Sodium     Other reaction(s): Muscle Pain     REVIEW OF SYSTEMS (Negative unless checked)  Constitutional: '[]'$ Weight loss  '[]'$ Fever  '[]'$ Chills Cardiac: '[]'$ Chest pain   '[]'$ Chest pressure   '[]'$ Palpitations   '[]'$ Shortness of breath when laying flat   '[]'$ Shortness of breath with exertion. Vascular:  '[x]'$ Pain in legs with walking   '[]'$ Pain in legs at rest  '[]'$ History of DVT   '[]'$ Phlebitis   '[]'$ Swelling in legs   '[]'$ Varicose veins   '[]'$ Non-healing ulcers Pulmonary:   '[]'$ Uses home oxygen   '[]'$ Productive cough   '[]'$ Hemoptysis   '[]'$ Wheeze  '[]'$ COPD   '[]'$ Asthma Neurologic:  '[]'$ Dizziness   '[]'$ Seizures   '[x]'$ History of stroke   '[]'$ History of TIA  '[]'$ Aphasia   '[]'$ Vissual changes   '[]'$ Weakness or numbness in arm   '[]'$ Weakness or numbness in leg Musculoskeletal:   '[]'$ Joint swelling   '[x]'$ Joint pain   '[]'$ Low back pain Hematologic:  '[]'$ Easy bruising  '[]'$ Easy bleeding   '[]'$ Hypercoagulable state   '[]'$ Anemic Gastrointestinal:  '[]'$ Diarrhea   '[]'$ Vomiting  '[]'$ Gastroesophageal reflux/heartburn   '[]'$ Difficulty swallowing. Genitourinary:  '[]'$ Chronic kidney disease   '[]'$ Difficult urination  '[]'$ Frequent urination   '[]'$ Blood in urine Skin:  '[]'$ Rashes   '[]'$ Ulcers  Psychological:  '[]'$ History of anxiety   '[]'$  History of major depression.  Physical Examination  Vitals:   12/30/21 0730  BP: (!) 159/80  Pulse: 64  Resp: 18  TempSrc: Oral  SpO2: 98%  Weight: 91.6 kg  Height: 6' (1.829 m)   Body mass index is 27.4 kg/m. Gen: WD/WN, NAD Head: [place]/AT, No temporalis wasting.  Ear/Nose/Throat: Hearing grossly intact, nares w/o erythema or drainage Eyes: PER, EOMI, sclera nonicteric.  Neck: Supple, no masses.  No bruit or JVD.  Pulmonary:  Good air movement, no audible wheezing, no use of accessory muscles.  Cardiac: RRR, normal S1, S2, no Murmurs. Vascular:  carotid bruit noted Vessel Right Left  Radial Palpable Palpable  Carotid  Palpable  Palpable  Subclav  Palpable Palpable  Gastrointestinal:  soft, non-distended. No guarding/no peritoneal signs.  Musculoskeletal: M/S 5/5 throughout.  No visible deformity.  Neurologic: CN 2-12 intact. Pain and light touch intact in extremities.  Symmetrical.  Speech is fluent. Motor exam as listed above. Psychiatric: Judgment intact, Mood & affect appropriate for pt's clinical situation. Dermatologic: No rashes or ulcers noted.  No changes consistent with cellulitis.   CBC Lab Results  Component Value Date   WBC 6.0 09/15/2021   HGB 17.4 (H) 09/15/2021   HCT 51.9 09/15/2021   MCV 91.4 09/15/2021   PLT 245.0 09/15/2021    BMET    Component Value Date/Time   NA 138 09/15/2021 0920   NA 140 10/23/2011 1142   K 4.4 09/15/2021 0920   CL  106 09/15/2021 0920   CO2 23 09/15/2021 0920   GLUCOSE 94 09/15/2021 0920   BUN 24 (H) 12/30/2021 0729   BUN 20 10/23/2011 1142   CREATININE 1.53 (H) 12/30/2021 0729   CREATININE 1.26 (H) 12/22/2019 1508   CALCIUM 9.4 09/15/2021 0920   GFRNONAA 46 (L) 12/30/2021 0729   GFRAA 69 10/23/2011 1142   Estimated Creatinine Clearance: 43 mL/min (A) (by C-G formula based on SCr of 1.53 mg/dL (H)).  COAG Lab Results  Component Value Date   INR 1.0 02/22/2021   INR 1.0 01/03/2021    Radiology CT ABDOMEN PELVIS WO CONTRAST  Result Date: 12/18/2021 CLINICAL DATA:  Gross hematuria lower pain and flank pain EXAM: CT ABDOMEN AND PELVIS WITHOUT CONTRAST TECHNIQUE: Multidetector CT imaging of the abdomen and pelvis was performed following the standard protocol without IV contrast. RADIATION DOSE REDUCTION: This exam was performed according to the departmental dose-optimization program which includes automated exposure control, adjustment of the mA and/or kV according to patient size and/or use of iterative reconstruction technique. COMPARISON:  CT 09/05/2018 FINDINGS: Lower chest: Lung bases demonstrate no acute airspace disease or effusion. Hepatobiliary: No focal liver abnormality is seen. No gallstones,  gallbladder wall thickening, or biliary dilatation. Pancreas: Unremarkable. No pancreatic ductal dilatation or surrounding inflammatory changes. Spleen: Normal in size without focal abnormality. Adrenals/Urinary Tract: Adrenal glands are within normal limits. Moderate left hydronephrosis and mild hydroureter with perinephric stranding. 4 mm calcification within the left inferior bladder, series 2, image 88. Stomach/Bowel: Stomach is within normal limits. Appendix appears normal. No evidence of bowel wall thickening, distention, or inflammatory changes. Vascular/Lymphatic: Mild aortic atherosclerosis. No aneurysm. No suspicious lymph nodes Reproductive: Prostatectomy Other: Negative for pelvic effusion or free air. Musculoskeletal: Chronic bilateral pars defect at L5. No acute or suspicious osseous abnormality. IMPRESSION: 1. Moderate left hydronephrosis and hydroureter. There is a 4 mm calcification within the left inferior bladder, the findings are suggestive of recently passed kidney stone 2. Otherwise no CT evidence for acute intra-abdominal or pelvic abnormality. Electronically Signed   By: Donavan Foil M.D.   On: 12/18/2021 16:04     Assessment/Plan 1. Carotid stenosis, symptomatic w/o infarct, left Recommend:   The patient is symptomatic with respect to the carotid stenosis.  The patient now has progressed and has a lesion the is >70%.   Patient's CT angiography of the carotid arteries confirms >70% left ICA stenosis.  The anatomical considerations support stenting over surgery.  This was discussed in detail with the patient.   The risks, benefits and alternative therapies were reviewed in detail with the patient.  All questions were answered.  The patient agrees to proceed with stenting of the left carotid artery.   Continue antiplatelet therapy as prescribed. Continue management of CAD, HTN and Hyperlipidemia. Healthy heart diet, encouraged exercise at least 4 times per week.     2.  Essential hypertension Continue antihypertensive medications as already ordered, these medications have been reviewed and there are no changes at this time.    3. Stage 3a chronic kidney disease ([place]) The patient has advanced renal disease.  However, at the present time the patient is not yet on dialysis.   Avoid nephrotoxic medications and dehydration.  Further plans per nephrology    4. Hyperlipidemia, unspecified hyperlipidemia type Continue statin as ordered and reviewed, no changes at this time    Hortencia Pilar, MD  12/30/2021 8:43 AM

## 2021-12-31 ENCOUNTER — Encounter: Payer: Self-pay | Admitting: Vascular Surgery

## 2021-12-31 LAB — CBC
HCT: 41.9 % (ref 39.0–52.0)
Hemoglobin: 14.1 g/dL (ref 13.0–17.0)
MCH: 30.5 pg (ref 26.0–34.0)
MCHC: 33.7 g/dL (ref 30.0–36.0)
MCV: 90.7 fL (ref 80.0–100.0)
Platelets: 234 10*3/uL (ref 150–400)
RBC: 4.62 MIL/uL (ref 4.22–5.81)
RDW: 12.3 % (ref 11.5–15.5)
WBC: 7.3 10*3/uL (ref 4.0–10.5)
nRBC: 0 % (ref 0.0–0.2)

## 2021-12-31 LAB — BASIC METABOLIC PANEL
Anion gap: 7 (ref 5–15)
BUN: 21 mg/dL (ref 8–23)
CO2: 23 mmol/L (ref 22–32)
Calcium: 8.9 mg/dL (ref 8.9–10.3)
Chloride: 110 mmol/L (ref 98–111)
Creatinine, Ser: 1.51 mg/dL — ABNORMAL HIGH (ref 0.61–1.24)
GFR, Estimated: 47 mL/min — ABNORMAL LOW (ref >60)
Glucose, Bld: 108 mg/dL — ABNORMAL HIGH (ref 70–99)
Potassium: 3.9 mmol/L (ref 3.5–5.1)
Sodium: 140 mmol/L (ref 135–145)

## 2021-12-31 NOTE — Discharge Summary (Signed)
Bobby Murillo    Discharge Summary    Patient ID:  Bobby Murillo [name]: [id number] DOB/AGE: 08/15/1940 81 y.o.  Admit date: 12/30/2021 Discharge date: 12/31/2021 Date of Surgery: 12/30/2021 Surgeon: Surgeon(s): Nakeia Calvi, Dolores Lory, MD  Admission Diagnosis: Carotid stenosis, symptomatic w/o infarct, left [I65.22]  Discharge Diagnoses:  Carotid stenosis, symptomatic w/o infarct, left [I65.22]  Secondary Diagnoses: Past Medical History:  Diagnosis Date   Abnormal LFTs    Acute cerebrovascular accident (CVA) due to thrombosis of left middle cerebral artery ([place]) 01/05/2021   Anxiety    Aortic atherosclerosis (HCC)    Arthritis    Basal cell carcinoma 09/14/2010   left calf    Bilateral carotid artery stenosis    a.) Carotid doppler 46/56/8127: 51-70% LICA; 0-17% RICA. b.) CTA neck 01/30/2021: critical stenosis LEFT carotid bulb.   Carotid stenosis, symptomatic, with infarction ([place]) 01/20/2021   CKD (chronic kidney disease), stage III ([place])    History of kidney stones    Hyperlipidemia    Hypertension    Long term current use of antithrombotics/antiplatelets    a.) DAPT therapy (ASA + clopidogrel)   Prostate cancer ([place]) 03/16/2008   Dr. Ronny Bacon   Sinus bradycardia by electrocardiogram    Stroke ([place]) 01/03/2021   a.) Acute infarction affecting the cortex. ?? incidental punctate acute white matter infarction just posterior to the atrium of the left lateral ventricle. Stenosis of the right M2 branch inferior division. Severe atherosclerotic disease of both PCAs with severe stenoses.    Procedure(s): CAROTID PTA/STENT INTERVENTION  Discharged Condition: good  HPI:  Patient presented yesterday to [facility] with a symptomatic critical stenosis of the left internal carotid artery.  He underwent successful left carotid artery stenting without complication.  Postoperative course has been normal and he is fit for discharge  today.  Hospital Course:  Bobby Murillo is a 81 y.o. male is S/P Left carotid stent Procedure(s): CAROTID PTA/STENT INTERVENTION Extubated: POD # 0 Physical exam: Neurologically intact.  Right groin without hematoma Post-op wounds clean, dry, intact or healing well Pt. Ambulating, voiding and taking PO diet without difficulty. Pt pain controlled with PO pain meds. Labs as below Complications:none  Consults:    Significant Diagnostic Studies: CBC Lab Results  Component Value Date   WBC 7.3 12/31/2021   HGB 14.1 12/31/2021   HCT 41.9 12/31/2021   MCV 90.7 12/31/2021   PLT 234 12/31/2021    BMET    Component Value Date/Time   NA 140 12/31/2021 0311   NA 140 10/23/2011 1142   K 3.9 12/31/2021 0311   CL 110 12/31/2021 0311   CO2 23 12/31/2021 0311   GLUCOSE 108 (H) 12/31/2021 0311   BUN 21 12/31/2021 0311   BUN 20 10/23/2011 1142   CREATININE 1.51 (H) 12/31/2021 0311   CREATININE 1.26 (H) 12/22/2019 1508   CALCIUM 8.9 12/31/2021 0311   GFRNONAA 47 (L) 12/31/2021 0311   GFRAA 69 10/23/2011 1142   COAG Lab Results  Component Value Date   INR 1.0 02/22/2021   INR 1.0 01/03/2021     Disposition:  Discharge to :Home Discharge Instructions     Call MD for:  redness, tenderness, or signs of infection (pain, swelling, bleeding, redness, odor or green/yellow discharge around incision site)   Complete by: As directed    Call MD for:  severe or increased pain, loss or decreased feeling  in affected limb(s)   Complete by: As directed    Call  MD for:  temperature >100.5   Complete by: As directed    Discharge instructions   Complete by: As directed    OK to shower  Remove the right groin bandage before showering and replace as needed   Driving Restrictions   Complete by: As directed    No driving for 1 week   Lifting restrictions   Complete by: As directed    No lifting for 1 week   No dressing needed   Complete by: As directed    Replace only if drainage  present   Resume previous diet   Complete by: As directed       Allergies as of 12/31/2021       Reactions   Alfuzosin Other (See Comments)   Other Reaction: fainting spell   Pravastatin Sodium    Other reaction(s): Muscle Pain        Medication List     TAKE these medications    acetaminophen 325 MG tablet Commonly known as: TYLENOL Take 650 mg by mouth every 6 (six) hours as needed for moderate pain.   ALPRAZolam 0.5 MG tablet Commonly known as: XANAX TAKE 1 TABLET BY MOUTH AT BEDTIME AS NEEDED FOR SLEEP   aspirin EC 81 MG tablet Take 81 mg by mouth daily. Swallow whole. What changed: Another medication with the same name was removed. Continue taking this medication, and follow the directions you see here.   clopidogrel 75 MG tablet Commonly known as: PLAVIX Take 1 tablet (75 mg total) by mouth daily.   COLLAGEN 1500/C PO Take by mouth.   ezetimibe 10 MG tablet Commonly known as: ZETIA Take 1 tablet by mouth once daily   fluticasone 50 MCG/ACT nasal spray Commonly known as: FLONASE Place 2 sprays into both nostrils daily.   GLUCOSAMINE CHONDR COMPLEX PO Take 1 tablet by mouth daily. tab by mouth daily   losartan 25 MG tablet Commonly known as: COZAAR Take 1 tablet (25 mg total) by mouth daily.   polycarbophil 625 MG tablet Commonly known as: FIBERCON Take 625 mg by mouth daily.   Praluent 150 MG/ML [place] Generic drug: Alirocumab Inject 150 mg into the skin every 14 (fourteen) days.       Verbal and written Discharge instructions given to the patient. Wound care per Discharge AVS  Follow-up Information     Leyana Whidden, Dolores Lory, MD Follow up in 3 week(s).   Specialties: Vascular Surgery, Cardiology, Radiology, Vascular Surgery Why: follow up after procedure witth a carotid ultrasound Contact information: [place] [address] [phone]                 Signed: Hortencia Pilar, MD  12/31/2021, 12:03 PM

## 2021-12-31 NOTE — Progress Notes (Signed)
1120 Discharge teaching done, reminded of restrictions.Questions answered. Awaiting ride home.

## 2022-01-02 ENCOUNTER — Telehealth: Payer: Self-pay

## 2022-01-02 NOTE — Telephone Encounter (Signed)
Transition Care Management Unsuccessful Follow-up Telephone Call  Date of discharge and from where:  12/31/21 [facility]  Attempts:  1st Attempt  Reason for unsuccessful TCM follow-up call:  Unable to reach patient. Will follow.

## 2022-01-05 NOTE — Telephone Encounter (Signed)
Transition Care Management Unsuccessful Follow-up Telephone Call  Date of discharge and from where:  12/31/21 [facility]  Attempts:  2nd Attempt  Reason for unsuccessful TCM follow-up call:   Patient notes he is scheduled to follow up with Cardiology and Vascular. Declines additional follow up at this time. Plans to keep previously scheduled follow up with PCP in January. Agrees to call the office with any ongoing questions or concerns. This closes TCM attempts by [facility].    Valyncia Wiens Motley LPN, [place], [place] Direct dial ??[phone]

## 2022-01-08 ENCOUNTER — Other Ambulatory Visit: Payer: Self-pay | Admitting: Family

## 2022-01-12 ENCOUNTER — Encounter (INDEPENDENT_AMBULATORY_CARE_PROVIDER_SITE_OTHER): Payer: Self-pay

## 2022-01-21 ENCOUNTER — Telehealth: Payer: Self-pay

## 2022-01-21 NOTE — Telephone Encounter (Signed)
PA for Praluent has been submitted on covermymeds.

## 2022-01-22 ENCOUNTER — Ambulatory Visit (INDEPENDENT_AMBULATORY_CARE_PROVIDER_SITE_OTHER): Payer: Medicare Other | Admitting: Nurse Practitioner

## 2022-01-22 ENCOUNTER — Encounter (INDEPENDENT_AMBULATORY_CARE_PROVIDER_SITE_OTHER): Payer: Medicare Other

## 2022-01-23 NOTE — Telephone Encounter (Signed)
Already address.  

## 2022-01-26 DIAGNOSIS — G4733 Obstructive sleep apnea (adult) (pediatric): Secondary | ICD-10-CM | POA: Diagnosis not present

## 2022-02-04 ENCOUNTER — Other Ambulatory Visit: Payer: Self-pay

## 2022-02-04 MED ORDER — EZETIMIBE 10 MG PO TABS: 10.0000 mg | ORAL_TABLET | Freq: Every day | ORAL | 1 refills | Status: DC

## 2022-02-18 ENCOUNTER — Other Ambulatory Visit (INDEPENDENT_AMBULATORY_CARE_PROVIDER_SITE_OTHER): Payer: Self-pay | Admitting: Vascular Surgery

## 2022-02-18 DIAGNOSIS — I6523 Occlusion and stenosis of bilateral carotid arteries: Secondary | ICD-10-CM

## 2022-02-18 DIAGNOSIS — Z959 Presence of cardiac and vascular implant and graft, unspecified: Secondary | ICD-10-CM

## 2022-02-19 ENCOUNTER — Ambulatory Visit (INDEPENDENT_AMBULATORY_CARE_PROVIDER_SITE_OTHER): Payer: Medicare Other

## 2022-02-19 ENCOUNTER — Ambulatory Visit (INDEPENDENT_AMBULATORY_CARE_PROVIDER_SITE_OTHER): Payer: Medicare Other | Admitting: Nurse Practitioner

## 2022-02-19 VITALS — BP 142/80 | Ht 72.0 in | Wt 209.0 lb

## 2022-02-19 DIAGNOSIS — Z959 Presence of cardiac and vascular implant and graft, unspecified: Secondary | ICD-10-CM | POA: Diagnosis not present

## 2022-02-19 DIAGNOSIS — I10 Essential (primary) hypertension: Secondary | ICD-10-CM

## 2022-02-19 DIAGNOSIS — I6522 Occlusion and stenosis of left carotid artery: Secondary | ICD-10-CM

## 2022-02-19 DIAGNOSIS — I6523 Occlusion and stenosis of bilateral carotid arteries: Secondary | ICD-10-CM

## 2022-02-19 DIAGNOSIS — E785 Hyperlipidemia, unspecified: Secondary | ICD-10-CM

## 2022-02-25 DIAGNOSIS — G4733 Obstructive sleep apnea (adult) (pediatric): Secondary | ICD-10-CM | POA: Diagnosis not present

## 2022-02-25 NOTE — Telephone Encounter (Signed)
MyChart messgae sent to patient. 

## 2022-03-02 ENCOUNTER — Encounter (INDEPENDENT_AMBULATORY_CARE_PROVIDER_SITE_OTHER): Payer: Self-pay | Admitting: Nurse Practitioner

## 2022-03-02 NOTE — Progress Notes (Signed)
Subjective:    Patient ID: Bobby Murillo, male    DOB: 1940/04/08, 81 y.o.   [name]: [id number] Chief Complaint  Patient presents with   Follow-up    ultrasound    The patient is seen for follow up evaluation of carotid stenosis status post left carotid stent on 12/30/2021.  There were no post operative problems or complications related to the surgery.  The patient denies neck pain.  This was following a left carotid endarterectomy which had some postoperative stenosis.  The patient denies interval amaurosis fugax. There is no recent history of TIA symptoms or focal motor deficits. There is no prior documented CVA.  The patient denies headache.  The patient is taking enteric-coated aspirin 81 mg daily.  No recent shortening of the patient's walking distance or new symptoms consistent with claudication.  No history of rest pain symptoms. No new ulcers or wounds of the lower extremities have occurred.  There is no history of DVT, PE or superficial thrombophlebitis. No recent episodes of angina or shortness of breath documented.   Today noninvasive studies show 1 to 39% right ICA stenosis.  The left also has a 1 to 39% stenosis with a patent stent.    Review of Systems  All other systems reviewed and are negative.      Objective:   Physical Exam Vitals reviewed.  HENT:     Head: Normocephalic.  Neck:     Vascular: No carotid bruit.  Cardiovascular:     Rate and Rhythm: Normal rate.  Pulmonary:     Effort: Pulmonary effort is normal.  Skin:    General: Skin is warm and dry.  Neurological:     Mental Status: He is alert and oriented to person, place, and time.  Psychiatric:        Mood and Affect: Mood normal.        Behavior: Behavior normal.        Thought Content: Thought content normal.        Judgment: Judgment normal.     BP (!) 142/80 (BP Location: Right Arm)   Ht 6' (1.829 m)   Wt 209 lb (94.8 kg)   BMI 28.35 kg/m   Past Medical History:  Diagnosis Date    Abnormal LFTs    Acute cerebrovascular accident (CVA) due to thrombosis of left middle cerebral artery ([place]) 01/05/2021   Anxiety    Aortic atherosclerosis (HCC)    Arthritis    Basal cell carcinoma 09/14/2010   left calf    Bilateral carotid artery stenosis    a.) Carotid doppler 39/05/90: 33-00% LICA; 7-62% RICA. b.) CTA neck 01/30/2021: critical stenosis LEFT carotid bulb.   Carotid stenosis, symptomatic, with infarction ([place]) 01/20/2021   CKD (chronic kidney disease), stage III ([place])    History of kidney stones    Hyperlipidemia    Hypertension    Long term current use of antithrombotics/antiplatelets    a.) DAPT therapy (ASA + clopidogrel)   Prostate cancer ([place]) 03/16/2008   Dr. Ronny Bacon   Sinus bradycardia by electrocardiogram    Stroke ([place]) 01/03/2021   a.) Acute infarction affecting the cortex. ?? incidental punctate acute white matter infarction just posterior to the atrium of the left lateral ventricle. Stenosis of the right M2 branch inferior division. Severe atherosclerotic disease of both PCAs with severe stenoses.    Social History   Socioeconomic History   Marital status: Married    Spouse name: [name]   Number of children: Not  on file   Years of education: Not on file   Highest education level: Not on file  Occupational History   Not on file  Tobacco Use   Smoking status: Never   Smokeless tobacco: Never  Vaping Use   Vaping Use: Never used  Substance and Sexual Activity   Alcohol use: Yes    Alcohol/week: 8.0 standard drinks of alcohol    Types: 4 Glasses of wine, 4 Shots of liquor per week    Comment: occasional   Drug use: No   Sexual activity: Yes  Other Topics Concern   Not on file  Social History Narrative   Patient lives in [place] with his wife.  Non-smoker.  Drinks over the weekend.  Retired from [employer].      Right handed   Social Determinants of Health   Financial Resource Strain: Low Risk  (04/09/2021)   Overall Financial Resource  Strain (CARDIA)    Difficulty of Paying Living Expenses: Not hard at all  Food Insecurity: No Food Insecurity (12/31/2021)   Hunger Vital Sign    Worried About Running Out of Food in the Last Year: Never true    Ran Out of Food in the Last Year: Never true  Transportation Needs: No Transportation Needs (12/31/2021)   PRAPARE - Hydrologist (Medical): No    Lack of Transportation (Non-Medical): No  Physical Activity: Insufficiently Active (04/09/2021)   Exercise Vital Sign    Days of Exercise per Week: 1 day    Minutes of Exercise per Session: 90 min  Stress: No Stress Concern Present (04/09/2021)   [place]    Feeling of Stress : Not at all  Social Connections: Unknown (04/09/2021)   Social Connection and Isolation Panel [NHANES]    Frequency of Communication with Friends and Family: More than three times a week    Frequency of Social Gatherings with Friends and Family: Once a week    Attends Religious Services: Not on file    Active Member of Clubs or Organizations: Yes    Attends Archivist Meetings: Not on file    Marital Status: Married  Intimate Partner Violence: Not At Risk (12/31/2021)   Humiliation, Afraid, Rape, and Kick questionnaire    Fear of Current or Ex-Partner: No    Emotionally Abused: No    Physically Abused: No    Sexually Abused: No    Past Surgical History:  Procedure Laterality Date   CAROTID PTA/STENT INTERVENTION Left 12/30/2021   Procedure: CAROTID PTA/STENT INTERVENTION;  Surgeon: Delana Meyer, Dolores Lory, MD;  Location: [facility];  Service: Cardiovascular;  Laterality: Left;   CATARACT EXTRACTION Bilateral    05/2021   COLONOSCOPY     ENDARTERECTOMY Left 02/19/2021   Procedure: ENDARTERECTOMY CAROTID;  Surgeon: Katha Cabal, MD;  Location: [facility];  Service: Vascular;  Laterality: Left;   PROSTATECTOMY  11/14/2008   transurethral,  radical, Dr. Darcus Austin    Family History  Problem Relation Age of Onset   Alzheimer's disease Mother    Stroke Father    Prostate cancer Father    Alzheimer's disease Sister    Stroke Paternal Aunt    Heart disease Paternal Uncle 34    Allergies  Allergen Reactions   Alfuzosin Other (See Comments)    Other Reaction: fainting spell   Pravastatin Sodium     Other reaction(s): Muscle Pain       Latest Ref  Rng & Units 12/31/2021    3:11 AM 09/15/2021    9:20 AM 05/27/2021    8:51 AM  CBC  WBC 4.0 - 10.5 K/uL 7.3  6.0  5.5   Hemoglobin 13.0 - 17.0 g/dL 14.1  17.4  17.3   Hematocrit 39.0 - 52.0 % 41.9  51.9  51.5   Platelets 150 - 400 K/uL 234  245.0  208.0       CMP     Component Value Date/Time   NA 140 12/31/2021 0311   NA 140 10/23/2011 1142   K 3.9 12/31/2021 0311   CL 110 12/31/2021 0311   CO2 23 12/31/2021 0311   GLUCOSE 108 (H) 12/31/2021 0311   BUN 21 12/31/2021 0311   BUN 20 10/23/2011 1142   CREATININE 1.51 (H) 12/31/2021 0311   CREATININE 1.26 (H) 12/22/2019 1508   CALCIUM 8.9 12/31/2021 0311   PROT 6.5 09/15/2021 0920   PROT 6.6 10/23/2011 1142   ALBUMIN 4.1 09/15/2021 0920   ALBUMIN 4.3 10/23/2011 1142   AST 22 09/15/2021 0920   ALT 28 09/15/2021 0920   ALKPHOS 94 09/15/2021 0920   BILITOT 1.1 09/15/2021 0920   GFRNONAA 47 (L) 12/31/2021 0311   GFRAA 69 10/23/2011 1142     No results found.     Assessment & Plan:   1. Carotid stenosis, symptomatic w/o infarct, left Recommend:  The patient is s/p successful left stent  Duplex ultrasound preoperatively shows 1-39% contralateral stenosis.  Continue antiplatelet therapy as prescribed Continue management of CAD, HTN and Hyperlipidemia Healthy heart diet,  encouraged exercise at least 4 times per week  Follow up in 3 months with duplex ultrasound and physical exam based on the patient's carotid surgery  2. Essential hypertension Continue antihypertensive medications as already ordered, these  medications have been reviewed and there are no changes at this time.  3. Hyperlipidemia LDL goal <100 Continue zetia as ordered and reviewed, no changes at this time   Current Outpatient Medications on File Prior to Visit  Medication Sig Dispense Refill   acetaminophen (TYLENOL) 325 MG tablet Take 650 mg by mouth every 6 (six) hours as needed for moderate pain.     Alirocumab (PRALUENT) 150 MG/ML SOAJ Inject 150 mg into the skin every 14 (fourteen) days. 2 mL 6   ALPRAZolam (XANAX) 0.5 MG tablet TAKE 1 TABLET BY MOUTH AT BEDTIME AS NEEDED FOR SLEEP 30 tablet 5   aspirin EC 81 MG tablet Take 81 mg by mouth daily. Swallow whole.     clopidogrel (PLAVIX) 75 MG tablet Take 1 tablet (75 mg total) by mouth daily. 30 tablet 6   Collagen-Vitamin C-Biotin (COLLAGEN 1500/C PO) Take by mouth.     ezetimibe (ZETIA) 10 MG tablet Take 1 tablet (10 mg total) by mouth daily. 90 tablet 1   Glucosamine-Chondroitin (GLUCOSAMINE CHONDR COMPLEX PO) Take 1 tablet by mouth daily. tab by mouth daily     losartan (COZAAR) 25 MG tablet Take 1 tablet (25 mg total) by mouth daily. 90 tablet 2   polycarbophil (FIBERCON) 625 MG tablet Take 625 mg by mouth daily.     fluticasone (FLONASE) 50 MCG/ACT nasal spray Place 2 sprays into both nostrils daily. (Patient not taking: Reported on 12/30/2021) 16 g 6   No current facility-administered medications on file prior to visit.    There are no Patient Instructions on file for this visit. No follow-ups on file.   Kris Hartmann, NP

## 2022-03-22 ENCOUNTER — Other Ambulatory Visit: Payer: Self-pay | Admitting: Internal Medicine

## 2022-03-23 ENCOUNTER — Encounter: Payer: Self-pay | Admitting: Internal Medicine

## 2022-03-23 ENCOUNTER — Ambulatory Visit (INDEPENDENT_AMBULATORY_CARE_PROVIDER_SITE_OTHER): Payer: Medicare Other | Admitting: Internal Medicine

## 2022-03-23 VITALS — BP 144/78 | HR 48 | Temp 97.5°F | Ht 72.0 in | Wt 208.4 lb

## 2022-03-23 DIAGNOSIS — I10 Essential (primary) hypertension: Secondary | ICD-10-CM

## 2022-03-23 DIAGNOSIS — I6930 Unspecified sequelae of cerebral infarction: Secondary | ICD-10-CM

## 2022-03-23 DIAGNOSIS — I700 Atherosclerosis of aorta: Secondary | ICD-10-CM

## 2022-03-23 DIAGNOSIS — R2689 Other abnormalities of gait and mobility: Secondary | ICD-10-CM | POA: Diagnosis not present

## 2022-03-23 DIAGNOSIS — F5102 Adjustment insomnia: Secondary | ICD-10-CM

## 2022-03-23 DIAGNOSIS — M6281 Muscle weakness (generalized): Secondary | ICD-10-CM | POA: Diagnosis not present

## 2022-03-23 DIAGNOSIS — E785 Hyperlipidemia, unspecified: Secondary | ICD-10-CM

## 2022-03-23 LAB — COMPREHENSIVE METABOLIC PANEL
ALT: 28 U/L (ref 0–53)
AST: 23 U/L (ref 0–37)
Albumin: 4.1 g/dL (ref 3.5–5.2)
Alkaline Phosphatase: 83 U/L (ref 39–117)
BUN: 17 mg/dL (ref 6–23)
CO2: 24 mEq/L (ref 19–32)
Calcium: 9.4 mg/dL (ref 8.4–10.5)
Chloride: 107 mEq/L (ref 96–112)
Creatinine, Ser: 1.32 mg/dL (ref 0.40–1.50)
GFR: 51.21 mL/min — ABNORMAL LOW (ref >60.00)
Glucose, Bld: 95 mg/dL (ref 70–99)
Potassium: 4.3 mEq/L (ref 3.5–5.1)
Sodium: 140 mEq/L (ref 135–145)
Total Bilirubin: 1.2 mg/dL (ref 0.2–1.2)
Total Protein: 6.7 g/dL (ref 6.0–8.3)

## 2022-03-23 LAB — MICROALBUMIN / CREATININE URINE RATIO
Creatinine,U: 116.1 mg/dL
Microalb Creat Ratio: 0.6 mg/g (ref 0.0–30.0)
Microalb, Ur: <0.7 mg/dL (ref 0.0–1.9)

## 2022-03-23 LAB — LIPID PANEL
Cholesterol: 220 mg/dL — ABNORMAL HIGH (ref 0–200)
HDL: 57.7 mg/dL (ref >39.00)
LDL Cholesterol: 143 mg/dL — ABNORMAL HIGH (ref 0–99)
NonHDL: 161.93
Total CHOL/HDL Ratio: 4
Triglycerides: 95 mg/dL (ref 0.0–149.0)
VLDL: 19 mg/dL (ref 0.0–40.0)

## 2022-03-23 LAB — LDL CHOLESTEROL, DIRECT: Direct LDL: 145 mg/dL

## 2022-03-23 MED ORDER — LOSARTAN POTASSIUM 50 MG PO TABS: 50.0000 mg | ORAL_TABLET | Freq: Every day | ORAL | 2 refills | Status: DC

## 2022-03-23 MED ORDER — ALPRAZOLAM 0.5 MG PO TABS: 0.5000 mg | ORAL_TABLET | Freq: Every evening | ORAL | 5 refills | Status: DC | PRN

## 2022-03-23 MED ORDER — PRALUENT 150 MG/ML ~~LOC~~ SOAJ: 150.0000 mg | SUBCUTANEOUS | 6 refills | Status: DC

## 2022-03-23 NOTE — Telephone Encounter (Signed)
Duplicate request

## 2022-03-23 NOTE — Progress Notes (Signed)
Subjective:  Patient ID: Bobby Murillo, male    DOB: [birth year]  Age: 82 y.o. [name]: [id number]  CC: There were no encounter diagnoses.   HPI Bobby Murillo presents for follow up on OSA and hyperlipididemia and hypetension Chief Complaint  Patient presents with   Medical Management of Chronic Issues    6 month follow up    1) OSA:  diagnosed in September .  Has been wearing autotitrating CPAP (nasal pillows)   since mid October.  Wears it for a few nights in a row, for 6 to 8 hours   then some nights he wakes up an hour later not tolerating it . Denies claustrophobia.  Uses 0.25 mg of  alprazolam (1/2 of prescribed tablet) .  Does not drink alcohol regularly,  usually 2 beers on Thursdays and Fridays   2) HTN:  Patient is taking losartan  as prescribed and notes no adverse effects.  Home BP readings have not been done  in a month week and are  generally < 130/80 .  he is avoiding added salt in her diet and walking regularly about 3 times per week for exercise  .   3) HLD: has not taken Praluent in months .  DOES NOT KNOW WHY PHARMACY HAS NOT FILLED SINCE MAY .  HAS NOT CALLED OFFICE .  Was tolerating the medication   4) POST CVA:  s/p left carotid arterectomy with recurrent stenosis noted by vascular surgery at follow up. Had a stent placed in October.   BALANCE  "STILL NOT GOOD"  DESPITE PT .  Avoiding playing tennis ,     Outpatient Medications Prior to Visit  Medication Sig Dispense Refill   acetaminophen (TYLENOL) 325 MG tablet Take 650 mg by mouth every 6 (six) hours as needed for moderate pain.     Alirocumab (PRALUENT) 150 MG/ML SOAJ Inject 150 mg into the skin every 14 (fourteen) days. 2 mL 6   ALPRAZolam (XANAX) 0.5 MG tablet TAKE 1 TABLET BY MOUTH AT BEDTIME AS NEEDED FOR SLEEP 30 tablet 5   aspirin EC 81 MG tablet Take 81 mg by mouth daily. Swallow whole.     clopidogrel (PLAVIX) 75 MG tablet Take 1 tablet (75 mg total) by mouth daily. 30 tablet 6   Collagen-Vitamin C-Biotin  (COLLAGEN 1500/C PO) Take by mouth.     ezetimibe (ZETIA) 10 MG tablet Take 1 tablet (10 mg total) by mouth daily. 90 tablet 1   fluticasone (FLONASE) 50 MCG/ACT nasal spray Place 2 sprays into both nostrils daily. 16 g 6   Glucosamine-Chondroitin (GLUCOSAMINE CHONDR COMPLEX PO) Take 1 tablet by mouth daily. tab by mouth daily     losartan (COZAAR) 25 MG tablet Take 1 tablet (25 mg total) by mouth daily. 90 tablet 2   polycarbophil (FIBERCON) 625 MG tablet Take 625 mg by mouth daily.     No facility-administered medications prior to visit.    Review of Systems;  Patient denies headache, fevers, malaise, unintentional weight loss, skin rash, eye pain, sinus congestion and sinus pain, sore throat, dysphagia,  hemoptysis , cough, dyspnea, wheezing, chest pain, palpitations, orthopnea, edema, abdominal pain, nausea, melena, diarrhea, constipation, flank pain, dysuria, hematuria, urinary  Frequency, nocturia, numbness, tingling, seizures,  Focal weakness, Loss of consciousness,  Tremor, insomnia, depression, anxiety, and suicidal ideation.      Objective:  BP (!) 158/76   Pulse (!) 48   Temp (!) 97.5 F (36.4 C) (Oral)   Ht 6' (  1.829 m)   Wt 208 lb 6.4 oz (94.5 kg)   SpO2 95%   BMI 28.26 kg/m   BP Readings from Last 3 Encounters:  03/23/22 (!) 158/76  02/19/22 (!) 142/80  12/31/21 110/63    Wt Readings from Last 3 Encounters:  03/23/22 208 lb 6.4 oz (94.5 kg)  02/19/22 209 lb (94.8 kg)  12/30/21 201 lb 15.1 oz (91.6 kg)    Physical Exam  Lab Results  Component Value Date   HGBA1C 6.2 05/27/2021   HGBA1C 6.0 (H) 01/03/2021   HGBA1C 5.8 06/09/2019    Lab Results  Component Value Date   CREATININE 1.51 (H) 12/31/2021   CREATININE 1.53 (H) 12/30/2021   CREATININE 1.50 (H) 11/12/2021    Lab Results  Component Value Date   WBC 7.3 12/31/2021   HGB 14.1 12/31/2021   HCT 41.9 12/31/2021   PLT 234 12/31/2021   GLUCOSE 108 (H) 12/31/2021   CHOL 118 09/15/2021   TRIG  93.0 09/15/2021   HDL 60.00 09/15/2021   LDLDIRECT 184.0 12/30/2015   LDLCALC 39 09/15/2021   ALT 28 09/15/2021   AST 22 09/15/2021   NA 140 12/31/2021   K 3.9 12/31/2021   CL 110 12/31/2021   CREATININE 1.51 (H) 12/31/2021   BUN 21 12/31/2021   CO2 23 12/31/2021   TSH 4.23 05/27/2021   PSA 0.00 (L) 05/27/2021   INR 1.0 02/22/2021   HGBA1C 6.2 05/27/2021   MICROALBUR <0.7 05/27/2021    PERIPHERAL VASCULAR CATHETERIZATION  Result Date: 12/30/2021 See surgical note for result.   Assessment & Plan:  .There are no diagnoses linked to this encounter.   I provided 30 minutes of face-to-face time during this encounter reviewing patient's last visit with me, patient's  most recent visit with cardiology,  nephrology,  and neurology,  recent surgical and non surgical procedures, previous  labs and imaging studies, counseling on currently addressed issues,  and post visit ordering to diagnostics and therapeutics .   Follow-up: No follow-ups on file.   Crecencio Mc, MD

## 2022-03-23 NOTE — Patient Instructions (Addendum)
Your blood pressure is 30 points too high!  Increase losartan dose to 50 mg daily using the 25 mg tablets,. 2 tablets  in the eveing  After one week. .  Stat checking BP once daily   and send me 7 days of readings   You should be taking Praluent.  We will call you pharmacy to find out why they have not filled it since May  If this happens in the future please call us for help   For the sleep apnea   on the nights you wake up not wanting to keep the CPAP ,  take the other 1/2 of alprazolam to help you relax AND KEEP THE CPAP ON  l

## 2022-03-24 ENCOUNTER — Other Ambulatory Visit: Payer: Self-pay | Admitting: Internal Medicine

## 2022-03-24 DIAGNOSIS — F5102 Adjustment insomnia: Secondary | ICD-10-CM

## 2022-03-24 MED ORDER — ALPRAZOLAM 0.5 MG PO TABS: 0.5000 mg | ORAL_TABLET | Freq: Every evening | ORAL | 5 refills | Status: DC | PRN

## 2022-03-24 NOTE — Assessment & Plan Note (Signed)
S/p CEA of Left ICA secondary to 7o to 99% stenosis,  With 45% stenosis noted on follow up CTA. Currently taking ASA 325 mg daily and Zetia.  Allowed praluent to lapse.    Has declined additional PT despite reporting balance issues.

## 2022-03-24 NOTE — Assessment & Plan Note (Signed)
Continue alprazolam low dose.  Advised to add 0.25 mg as needed to improve  CPAP mask intolerance

## 2022-03-24 NOTE — Assessment & Plan Note (Signed)
Reviewed findings of prior CT scan today..  Patient was intolerant of therapeutic doses of high potency statin therapy and was tolerating Praluent and zetia and praluent.  LDL has risen due to lapse in praluent therapy  Lab Results  Component Value Date   CHOL 220 (H) 03/23/2022   HDL 57.70 03/23/2022   LDLCALC 143 (H) 03/23/2022   LDLDIRECT 145.0 03/23/2022   TRIG 95.0 03/23/2022   CHOLHDL 4 03/23/2022

## 2022-03-24 NOTE — Assessment & Plan Note (Signed)
Improved on exam today.  He is able to rise from a chair without use of hANDS.

## 2022-03-24 NOTE — Assessment & Plan Note (Signed)
Not at goal on 25 mg losartan..  increase to 50 mg

## 2022-03-24 NOTE — Assessment & Plan Note (Signed)
Currently untreated due to lapse in  Praluent since May. Counselled patient on the importance of communicating with office before stopping medications  Lab Results  Component Value Date   CHOL 220 (H) 03/23/2022   HDL 57.70 03/23/2022   LDLCALC 143 (H) 03/23/2022   LDLDIRECT 145.0 03/23/2022   TRIG 95.0 03/23/2022   CHOLHDL 4 03/23/2022

## 2022-03-24 NOTE — Assessment & Plan Note (Signed)
Secondary to CVA. There was No change with reduction in losartan dose and home readings are  again >130/80.  He was again advised to resume 50 mg daily losartan .  He has declined additional use of PT

## 2022-03-28 DIAGNOSIS — G4733 Obstructive sleep apnea (adult) (pediatric): Secondary | ICD-10-CM | POA: Diagnosis not present

## 2022-04-08 DIAGNOSIS — D2262 Melanocytic nevi of left upper limb, including shoulder: Secondary | ICD-10-CM | POA: Diagnosis not present

## 2022-04-08 DIAGNOSIS — D225 Melanocytic nevi of trunk: Secondary | ICD-10-CM | POA: Diagnosis not present

## 2022-04-08 DIAGNOSIS — L821 Other seborrheic keratosis: Secondary | ICD-10-CM | POA: Diagnosis not present

## 2022-04-08 DIAGNOSIS — L570 Actinic keratosis: Secondary | ICD-10-CM | POA: Diagnosis not present

## 2022-04-08 DIAGNOSIS — D2272 Melanocytic nevi of left lower limb, including hip: Secondary | ICD-10-CM | POA: Diagnosis not present

## 2022-04-08 DIAGNOSIS — D2261 Melanocytic nevi of right upper limb, including shoulder: Secondary | ICD-10-CM | POA: Diagnosis not present

## 2022-04-08 DIAGNOSIS — D2271 Melanocytic nevi of right lower limb, including hip: Secondary | ICD-10-CM | POA: Diagnosis not present

## 2022-04-17 ENCOUNTER — Ambulatory Visit (INDEPENDENT_AMBULATORY_CARE_PROVIDER_SITE_OTHER): Payer: Medicare Other

## 2022-04-17 VITALS — BP 118/78 | HR 94 | Temp 97.3°F | Resp 16 | Ht 72.0 in | Wt 212.0 lb

## 2022-04-17 DIAGNOSIS — Z0000 Encounter for general adult medical examination without abnormal findings: Secondary | ICD-10-CM | POA: Diagnosis not present

## 2022-04-17 NOTE — Patient Instructions (Addendum)
Bobby Murillo , Thank you for taking time to come for your Medicare Wellness Visit. I appreciate your ongoing commitment to your health goals. Please review the following plan we discussed and let me know if I can assist you in the future.   These are the goals we discussed:  Goals       Patient Stated     Increase physical activity (pt-stated)      Stay as active as tolerated.        This is a list of the screening recommended for you and due dates:  Health Maintenance  Topic Date Due   COVID-19 Vaccine (5 - 2023-24 season) 05/03/2022*   Zoster (Shingles) Vaccine (1 of 2) 06/22/2022*   DTaP/Tdap/Td vaccine (2 - Td or Tdap) 10/16/2022   Medicare Annual Wellness Visit  04/18/2023   Pneumonia Vaccine  Completed   Flu Shot  Completed   Hepatitis C Screening: USPSTF Recommendation to screen - Ages 18-80 yo.  Completed   HPV Vaccine  Aged Out  *Topic was postponed. The date shown is not the original due date.    Advanced directives: End of life planning; Advance aging; Advanced directives discussed.  Copy of current HCPOA/Living Will requested.    Conditions/risks identified: none new.   Next appointment: Follow up in one year for your annual wellness visit.   Preventive Care 47 Years and Older, Male  Preventive care refers to lifestyle choices and visits with your health care provider that can promote health and wellness. What does preventive care include? A yearly physical exam. This is also called an annual well check. Dental exams once or twice a year. Routine eye exams. Ask your health care provider how often you should have your eyes checked. Personal lifestyle choices, including: Daily care of your teeth and gums. Regular physical activity. Eating a healthy diet. Avoiding tobacco and drug use. Limiting alcohol use. Practicing safe sex. Taking low doses of aspirin every day. Taking vitamin and mineral supplements as recommended by your health care provider. What happens  during an annual well check? The services and screenings done by your health care provider during your annual well check will depend on your age, overall health, lifestyle risk factors, and family history of disease. Counseling  Your health care provider may ask you questions about your: Alcohol use. Tobacco use. Drug use. Emotional well-being. Home and relationship well-being. Sexual activity. Eating habits. History of falls. Memory and ability to understand (cognition). Work and work Statistician. Screening  You may have the following tests or measurements: Height, weight, and BMI. Blood pressure. Lipid and cholesterol levels. These may be checked every 5 years, or more frequently if you are over 3 years old. Skin check. Lung cancer screening. You may have this screening every year starting at age 80 if you have a 30-pack-year history of smoking and currently smoke or have quit within the past 15 years. Fecal occult blood test (FOBT) of the stool. You may have this test every year starting at age 80. Flexible sigmoidoscopy or colonoscopy. You may have a sigmoidoscopy every 5 years or a colonoscopy every 10 years starting at age 12. Prostate cancer screening. Recommendations will vary depending on your family history and other risks. Hepatitis C blood test. Hepatitis B blood test. Sexually transmitted disease (STD) testing. Diabetes screening. This is done by checking your blood sugar (glucose) after you have not eaten for a while (fasting). You may have this done every 1-3 years. Abdominal aortic aneurysm (AAA) screening.  You may need this if you are a current or former smoker. Osteoporosis. You may be screened starting at age 80 if you are at high risk. Talk with your health care provider about your test results, treatment options, and if necessary, the need for more tests. Vaccines  Your health care provider may recommend certain vaccines, such as: Influenza vaccine. This is  recommended every year. Tetanus, diphtheria, and acellular pertussis (Tdap, Td) vaccine. You may need a Td booster every 10 years. Zoster vaccine. You may need this after age 80. Pneumococcal 13-valent conjugate (PCV13) vaccine. One dose is recommended after age 80. Pneumococcal polysaccharide (PPSV23) vaccine. One dose is recommended after age 80. Talk to your health care provider about which screenings and vaccines you need and how often you need them. This information is not intended to replace advice given to you by your health care provider. Make sure you discuss any questions you have with your health care provider. Document Released: 03/29/2015 Document Revised: 11/20/2015 Document Reviewed: 01/01/2015 Elsevier Interactive Patient Education  2017 [place] Prevention in the Home Falls can cause injuries. They can happen to people of all ages. There are many things you can do to make your home safe and to help prevent falls. What can I do on the outside of my home? Regularly fix the edges of walkways and driveways and fix any cracks. Remove anything that might make you trip as you walk through a door, such as a raised step or threshold. Trim any bushes or trees on the path to your home. Use bright outdoor lighting. Clear any walking paths of anything that might make someone trip, such as rocks or tools. Regularly check to see if handrails are loose or broken. Make sure that both sides of any steps have handrails. Any raised decks and porches should have guardrails on the edges. Have any leaves, snow, or ice cleared regularly. Use sand or salt on walking paths during winter. Clean up any spills in your garage right away. This includes oil or grease spills. What can I do in the bathroom? Use night lights. Install grab bars by the toilet and in the tub and shower. Do not use towel bars as grab bars. Use non-skid mats or decals in the tub or shower. If you need to sit down in  the shower, use a plastic, non-slip stool. Keep the floor dry. Clean up any water that spills on the floor as soon as it happens. Remove soap buildup in the tub or shower regularly. Attach bath mats securely with double-sided non-slip rug tape. Do not have throw rugs and other things on the floor that can make you trip. What can I do in the bedroom? Use night lights. Make sure that you have a light by your bed that is easy to reach. Do not use any sheets or blankets that are too big for your bed. They should not hang down onto the floor. Have a firm chair that has side arms. You can use this for support while you get dressed. Do not have throw rugs and other things on the floor that can make you trip. What can I do in the kitchen? Clean up any spills right away. Avoid walking on wet floors. Keep items that you use a lot in easy-to-reach places. If you need to reach something above you, use a strong step stool that has a grab bar. Keep electrical cords out of the way. Do not use floor polish or wax  that makes floors slippery. If you must use wax, use non-skid floor wax. Do not have throw rugs and other things on the floor that can make you trip. What can I do with my stairs? Do not leave any items on the stairs. Make sure that there are handrails on both sides of the stairs and use them. Fix handrails that are broken or loose. Make sure that handrails are as long as the stairways. Check any carpeting to make sure that it is firmly attached to the stairs. Fix any carpet that is loose or worn. Avoid having throw rugs at the top or bottom of the stairs. If you do have throw rugs, attach them to the floor with carpet tape. Make sure that you have a light switch at the top of the stairs and the bottom of the stairs. If you do not have them, ask someone to add them for you. What else can I do to help prevent falls? Wear shoes that: Do not have high heels. Have rubber bottoms. Are comfortable  and fit you well. Are closed at the toe. Do not wear sandals. If you use a stepladder: Make sure that it is fully opened. Do not climb a closed stepladder. Make sure that both sides of the stepladder are locked into place. Ask someone to hold it for you, if possible. Clearly mark and make sure that you can see: Any grab bars or handrails. First and last steps. Where the edge of each step is. Use tools that help you move around (mobility aids) if they are needed. These include: Canes. Walkers. Scooters. Crutches. Turn on the lights when you go into a dark area. Replace any light bulbs as soon as they burn out. Set up your furniture so you have a clear path. Avoid moving your furniture around. If any of your floors are uneven, fix them. If there are any pets around you, be aware of where they are. Review your medicines with your doctor. Some medicines can make you feel dizzy. This can increase your chance of falling. Ask your doctor what other things that you can do to help prevent falls. This information is not intended to replace advice given to you by your health care provider. Make sure you discuss any questions you have with your health care provider. Document Released: 12/27/2008 Document Revised: 08/08/2015 Document Reviewed: 04/06/2014 Elsevier Interactive Patient Education  2017 [employer].

## 2022-04-17 NOTE — Progress Notes (Cosign Needed Addendum)
Subjective:   Bobby Murillo is a 82 y.o. male who presents for Medicare Annual/Subsequent preventive examination.  Review of Systems    No ROS.  Medicare Wellness  See social history for additional risk factors.   Cardiac Risk Factors include: advanced age (>67mn, >>60women);male gender;hypertension     Objective:    Today's Vitals   04/17/22 1019  BP: 118/78  Pulse: 94  Resp: 16  Temp: (!) 97.3 F (36.3 C)  SpO2: 99%  Weight: 212 lb (96.2 kg)  Height: 6' (1.829 m)   Body mass index is 28.75 kg/m.     04/17/2022   10:26 AM 12/30/2021    7:32 AM 06/03/2021   11:04 AM 04/09/2021    9:13 AM 02/22/2021    8:28 PM 02/19/2021    8:12 AM 02/14/2021    8:46 AM  Advanced Directives  Does Patient Have a Medical Advance Directive? Yes Yes Yes Yes Yes Yes Yes  Type of AParamedicof APunta SantiagoLiving will Living will;Healthcare Power of [place]enn[place]Living will [place] GroveLiving will [place]Living will   Does patient want to make changes to medical advance directive? No - Patient declined No - Guardian declined  No - Patient declined No - Patient declined No - Patient declined   Copy of HL[place]in Chart? No - copy requested No - copy requested  No - copy requested No - copy requested No - copy requested   Would patient like information on creating a medical advance directive?     No - Patient declined      Current Medications (verified) Outpatient Encounter Medications as of 04/17/2022  Medication Sig   acetaminophen (TYLENOL) 325 MG tablet Take 650 mg by mouth every 6 (six) hours as needed for moderate pain.   Alirocumab (PRALUENT) 150 MG/ML SOAJ Inject 150 mg into the skin every 14 (fourteen) days.   ALPRAZolam (XANAX) 0.5 MG tablet Take 1 tablet (0.5 mg total) by mouth at bedtime as needed. for sleep   aspirin EC 81 MG tablet Take 81 mg by mouth daily. Swallow whole.    clopidogrel (PLAVIX) 75 MG tablet Take 1 tablet (75 mg total) by mouth daily.   Collagen-Vitamin C-Biotin (COLLAGEN 1500/C PO) Take by mouth.   ezetimibe (ZETIA) 10 MG tablet Take 1 tablet (10 mg total) by mouth daily.   fluticasone (FLONASE) 50 MCG/ACT nasal spray Place 2 sprays into both nostrils daily.   Glucosamine-Chondroitin (GLUCOSAMINE CHONDR COMPLEX PO) Take 1 tablet by mouth daily. tab by mouth daily   losartan (COZAAR) 50 MG tablet Take 1 tablet (50 mg total) by mouth daily.   polycarbophil (FIBERCON) 625 MG tablet Take 625 mg by mouth daily.   No facility-administered encounter medications on file as of 04/17/2022.    Allergies (verified) Alfuzosin and Pravastatin sodium   History: Past Medical History:  Diagnosis Date   Abnormal LFTs    Acute cerebrovascular accident (CVA) due to thrombosis of left middle cerebral artery [place] 01/05[place]2021   Anxiety    Aortic atherosclerosis (HCC)    Arthritis    Basal cell carcinoma 09/14/2010   left calf    Bilateral carotid artery stenosis    a.) Carotid doppler 165/99/3570 717-79%LICA; 03-90%RICA. b.) CTA neck 01/30/2021: critical stenosis LEFT carotid bulb.   Carotid stenosis, symptomatic, with infarction [place] 01/20/2021   CKD (chronic kidney disease), stage III ([place]    History of kidney  stones    Hyperlipidemia    Hypertension    Long term current use of antithrombotics/antiplatelets    a.) DAPT therapy (ASA + clopidogrel)   Prostate cancer ([place]) 03/16/2008   Dr. Ronny Bacon   Sinus bradycardia by electrocardiogram    Stroke ([place]) 01/03/2021   a.) Acute infarction affecting the cortex. ?? incidental punctate acute white matter infarction just posterior to the atrium of the left lateral ventricle. Stenosis of the right M2 branch inferior division. Severe atherosclerotic disease of both PCAs with severe stenoses.   Past Surgical History:  Procedure Laterality Date   CAROTID PTA/STENT INTERVENTION Left 12/30/2021   Procedure: CAROTID  PTA/STENT INTERVENTION;  Surgeon: Katha Cabal, MD;  Location: [facility];  Service: Cardiovascular;  Laterality: Left;   CATARACT EXTRACTION Bilateral    05/2021   COLONOSCOPY     ENDARTERECTOMY Left 02/19/2021   Procedure: ENDARTERECTOMY CAROTID;  Surgeon: Katha Cabal, MD;  Location: [facility];  Service: Vascular;  Laterality: Left;   PROSTATECTOMY  11/14/2008   transurethral, radical, Dr. Darcus Austin   Family History  Problem Relation Age of Onset   Alzheimer's disease Mother    Stroke Father    Prostate cancer Father    Alzheimer's disease Sister    Stroke Paternal Aunt    Heart disease Paternal Uncle 81   Social History   Socioeconomic History   Marital status: Married    Spouse name: [name]   Number of children: Not on file   Years of education: Not on file   Highest education level: Not on file  Occupational History   Not on file  Tobacco Use   Smoking status: Never   Smokeless tobacco: Never  Vaping Use   Vaping Use: Never used  Substance and Sexual Activity   Alcohol use: Yes    Alcohol/week: 8.0 standard drinks of alcohol    Types: 4 Glasses of wine, 4 Shots of liquor per week    Comment: occasional   Drug use: No   Sexual activity: Yes  Other Topics Concern   Not on file  Social History Narrative   Patient lives in [place] with his wife.  Non-smoker.  Drinks over the weekend.  Retired from [employer].      Right handed   Social Determinants of Health   Financial Resource Strain: Low Risk  (04/09/2021)   Overall Financial Resource Strain (CARDIA)    Difficulty of Paying Living Expenses: Not hard at all  Food Insecurity: No Food Insecurity (12/31/2021)   Hunger Vital Sign    Worried About Running Out of Food in the Last Year: Never true    Ran Out of Food in the Last Year: Never true  Transportation Needs: No Transportation Needs (12/31/2021)   PRAPARE - Hydrologist (Medical): No    Lack of Transportation  (Non-Medical): No  Physical Activity: Insufficiently Active (04/09/2021)   Exercise Vital Sign    Days of Exercise per Week: 1 day    Minutes of Exercise per Session: 90 min  Stress: No Stress Concern Present (04/09/2021)   [place]    Feeling of Stress : Not at all  Social Connections: Unknown (04/09/2021)   Social Connection and Isolation Panel [NHANES]    Frequency of Communication with Friends and Family: More than three times a week    Frequency of Social Gatherings with Friends and Family: Once a week    Attends Religious  Services: Not on file    Active Member of Clubs or Organizations: Yes    Attends Archivist Meetings: Not on file    Marital Status: Married    Tobacco Counseling Counseling given: Not Answered   Clinical Intake:  Pre-visit preparation completed: Yes        Diabetes: No  How often do you need to have someone help you when you read instructions, pamphlets, or other written materials from your doctor or pharmacy?: 1 - Never   Interpreter Needed?: No      Activities of Daily Living    04/17/2022   10:32 AM 12/31/2021   10:05 AM  In your present state of health, do you have any difficulty performing the following activities:  Hearing? 0   Vision? 0   Difficulty concentrating or making decisions? 0   Comment Age appropriate   Walking or climbing stairs? 0   Comment Paces self with activity   Dressing or bathing? 0   Doing errands, shopping? 0 0  Preparing Food and eating ? N   Using the Toilet? N   In the past six months, have you accidently leaked urine? N   Do you have problems with loss of bowel control? N   Managing your Medications? N   Managing your Finances? N   Housekeeping or managing your Housekeeping? N     Patient Care Team: Crecencio Mc, MD as PCP - General (Internal Medicine) Kate Sable, MD as Consulting Physician  (Cardiology)  Indicate any recent Medical Services you may have received from other than [facility] providers in the past year (date may be approximate).     Assessment:   This is a routine wellness examination for [place].  Hearing/Vision screen Hearing Screening - Comments:: Patient is able to hear conversational tones without difficulty.  No issues reported.    Vision Screening - Comments:: Followed by [facility]  Wears corrective lenses  Annual visits  Cataracts extracted, bilateral They have regular follow up with the ophthalmologist  Dietary issues and exercise activities discussed: Current Exercise Habits: Home exercise routine, Intensity: Mild Regular diet    Goals Addressed               This Visit's Progress     Patient Stated     Increase physical activity (pt-stated)        Stay as active as tolerated.       Depression Screen    04/17/2022   10:27 AM 03/23/2022    8:44 AM 12/19/2021    3:39 PM 12/09/2021    2:38 PM 09/15/2021    8:21 AM 06/16/2021   11:58 AM 05/12/2021    8:28 AM  PHQ 2/9 Scores  PHQ - 2 Score 0 0 0 0 0 0 0    Fall Risk    04/17/2022   10:27 AM 03/23/2022    8:44 AM 12/19/2021    3:39 PM 12/09/2021    2:38 PM 09/15/2021    8:11 AM  Fall Risk   Falls in the past year? 0 0 0 0 0  Number falls in past yr: 0   0   Injury with Fall? 0   0   Risk for fall due to :  No Fall Risks No Fall Risks No Fall Risks No Fall Risks  Follow up  Falls evaluation completed Falls evaluation completed Falls evaluation completed Falls evaluation completed    [place]: Home free  of loose throw rugs in walkways, pet beds, electrical cords, etc? Yes  Adequate lighting in your home to reduce risk of falls? Yes   ASSISTIVE DEVICES UTILIZED TO PREVENT FALLS: Life alert? No  Use of a cane, walker or w/c? No  Grab bars in the bathroom? No  Shower chair or bench in shower? No  Elevated toilet seat or a handicapped toilet? No    TIMED UP AND GO: Was the test performed? Yes .  Length of time to ambulate 10 feet: 15 sec.   Gait slow and steady without use of assistive device  Cognitive Function:    04/09/2021    9:35 AM  MMSE - Mini Mental State Exam  Not completed: Unable to complete        04/17/2022   10:37 AM 04/08/2020    9:48 AM 04/06/2019    9:50 AM 04/04/2018    9:54 AM 03/29/2017    2:45 PM  6CIT Screen  What Year? 0 points 0 points 0 points 0 points 0 points  What month? 0 points 0 points 0 points 0 points 0 points  What time? 0 points 0 points 0 points 0 points 0 points  Count back from 20 0 points 0 points 0 points 0 points 0 points  Months in reverse 0 points 0 points 0 points 0 points 0 points  Repeat phrase 0 points 0 points 0 points 0 points 0 points  Total Score 0 points 0 points 0 points 0 points 0 points    Immunizations Immunization History  Administered Date(s) Administered   Fluad Quad(high Dose 65+) 12/15/2019, 12/23/2020, 12/19/2021   Influenza Split 02/13/2011   Influenza, High Dose Seasonal PF 12/30/2015, 01/07/2018   Influenza,inj,Quad PF,6+ Mos 11/08/2013, 11/30/2014, 03/29/2017   Influenza-Unspecified 12/30/2012   PFIZER(Purple Top)SARS-COV-2 Vaccination 03/20/2019, 04/10/2019, 02/13/2020, 09/30/2020   Pneumococcal Conjugate-13 11/08/2013   Pneumococcal Polysaccharide-23 02/13/2011, 02/07/2018   Tdap 10/15/2012   Screening Tests Health Maintenance  Topic Date Due   COVID-19 Vaccine (5 - 2023-24 season) 05/03/2022 (Originally 11/14/2021)   Zoster Vaccines- Shingrix (1 of 2) 06/22/2022 (Originally 06/14/1959)   DTaP/Tdap/Td (2 - Td or Tdap) 10/16/2022   Medicare Annual Wellness (AWV)  04/18/2023   Pneumonia Vaccine 24+ Years old  Completed   INFLUENZA VACCINE  Completed   Hepatitis C Screening  Completed   HPV VACCINES  Aged Out   Health Maintenance There are no preventive care reminders to display for this patient.  Lung Cancer Screening: (Low Dose CT Chest  recommended if Age 67-80 years, 30 pack-year currently smoking OR have quit w/in 15years.) does not qualify.   Hepatitis C Screening: Completed 04/2020.  Vision Screening: Recommended annual ophthalmology exams for early detection of glaucoma and other disorders of the eye.  Dental Screening: Recommended annual dental exams for proper oral hygiene  Community Resource Referral / Chronic Care Management: CRR required this visit?  No   CCM required this visit?  No      Plan:     I have personally reviewed and noted the following in the patient's chart:   Medical and social history Use of alcohol, tobacco or illicit drugs  Current medications and supplements including opioid prescriptions. Patient is not currently taking opioid prescriptions. Functional ability and status Nutritional status Physical activity Advanced directives List of other physicians Hospitalizations, surgeries, and ER visits in previous 12 months Vitals Screenings to include cognitive, depression, and falls Referrals and appointments  In addition, I have reviewed and discussed with patient  certain preventive protocols, quality metrics, and best practice recommendations. A written personalized care plan for preventive services as well as general preventive health recommendations were provided to patient.     [place], LPN   08/21/1243     I have reviewed the above information and agree with above.   Deborra Medina, MD

## 2022-04-28 DIAGNOSIS — G4733 Obstructive sleep apnea (adult) (pediatric): Secondary | ICD-10-CM | POA: Diagnosis not present

## 2022-05-13 ENCOUNTER — Other Ambulatory Visit (INDEPENDENT_AMBULATORY_CARE_PROVIDER_SITE_OTHER): Payer: Self-pay | Admitting: Vascular Surgery

## 2022-05-13 DIAGNOSIS — I6523 Occlusion and stenosis of bilateral carotid arteries: Secondary | ICD-10-CM

## 2022-05-17 NOTE — Progress Notes (Unsigned)
[name] : [id number]  Bobby Murillo is a 82 y.o. (1940-04-03) male who presents with chief complaint of check carotid arteries.  History of Present Illness:  The patient is seen for follow up evaluation of carotid stenosis status post left carotid stent placement on 12/30/2021.    Procedure: Placement of a 8 mm by 6 mm x 30 mm exact stent with the use of the NAV-6 embolic protection device in the left carotid artery   There were no post operative problems or complications related to the surgery.  The patient denies neck or incisional pain.  The patient denies interval amaurosis fugax. There is no recent history of TIA symptoms or focal motor deficits. There is no prior documented CVA.  The patient denies headache.  The patient is taking enteric-coated aspirin 81 mg daily.  No recent shortening of the patient's walking distance or new symptoms consistent with claudication.  No history of rest pain symptoms. No new ulcers or wounds of the lower extremities have occurred.  There is no history of DVT, PE or superficial thrombophlebitis. No recent episodes of angina or shortness of breath documented.   No outpatient medications have been marked as taking for the 05/18/22 encounter (Appointment) with Delana Meyer, Dolores Lory, MD.    Past Medical History:  Diagnosis Date   Abnormal LFTs    Acute cerebrovascular accident (CVA) due to thrombosis of left middle cerebral artery ([place]) 01/05/2021   Anxiety    Aortic atherosclerosis ([place])    Arthritis    Basal cell carcinoma 09/14/2010   left calf    Bilateral carotid artery stenosis    a.) Carotid doppler 0000000: [id number] LICA; [id number] RICA. b.) CTA neck 01/30/2021: critical stenosis LEFT carotid bulb.   Carotid stenosis, symptomatic, with infarction ([place]) 01/20/2021   CKD (chronic kidney disease), stage III ([place])    History of kidney stones    Hyperlipidemia    Hypertension    Long term current use of antithrombotics/antiplatelets    a.)  DAPT therapy (ASA + clopidogrel)   Prostate cancer ([place]) 03/16/2008   Dr. Ronny Bacon   Sinus bradycardia by electrocardiogram    Stroke ([place]) 01/03/2021   a.) Acute infarction affecting the cortex. ?? incidental punctate acute white matter infarction just posterior to the atrium of the left lateral ventricle. Stenosis of the right M2 branch inferior division. Severe atherosclerotic disease of both PCAs with severe stenoses.    Past Surgical History:  Procedure Laterality Date   CAROTID PTA/STENT INTERVENTION Left 12/30/2021   Procedure: CAROTID PTA/STENT INTERVENTION;  Surgeon: Katha Cabal, MD;  Location: [facility];  Service: Cardiovascular;  Laterality: Left;   CATARACT EXTRACTION Bilateral    05/2021   COLONOSCOPY     ENDARTERECTOMY Left 02/19/2021   Procedure: ENDARTERECTOMY CAROTID;  Surgeon: Katha Cabal, MD;  Location: [facility];  Service: Vascular;  Laterality: Left;   PROSTATECTOMY  11/14/2008   transurethral, radical, Dr. Darcus Austin    Social History Social History   Tobacco Use   Smoking status: Never   Smokeless tobacco: Never  Vaping Use   Vaping Use: Never used  Substance Use Topics   Alcohol use: Yes    Alcohol/week: 8.0 standard drinks of alcohol    Types: 4 Glasses of wine, 4 Shots of liquor per week    Comment: occasional   Drug use: No    Family History Family History  Problem Relation Age of Onset   Alzheimer's disease Mother  Stroke Father    Prostate cancer Father    Alzheimer's disease Sister    Stroke Paternal Aunt    Heart disease Paternal Uncle 32    Allergies  Allergen Reactions   Alfuzosin Other (See Comments)    Other Reaction: fainting spell   Pravastatin Sodium     Other reaction(s): Muscle Pain     REVIEW OF SYSTEMS (Negative unless checked)  Constitutional: '[]'$ Weight loss  '[]'$ Fever  '[]'$ Chills Cardiac: '[]'$ Chest pain   '[]'$ Chest pressure   '[]'$ Palpitations   '[]'$ Shortness of breath when laying flat   '[]'$ Shortness of breath  with exertion. Vascular:  '[x]'$ Pain in legs with walking   '[]'$ Pain in legs at rest  '[]'$ History of DVT   '[]'$ Phlebitis   '[]'$ Swelling in legs   '[]'$ Varicose veins   '[]'$ Non-healing ulcers Pulmonary:   '[]'$ Uses home oxygen   '[]'$ Productive cough   '[]'$ Hemoptysis   '[]'$ Wheeze  '[]'$ COPD   '[]'$ Asthma Neurologic:  '[]'$ Dizziness   '[]'$ Seizures   '[]'$ History of stroke   '[]'$ History of TIA  '[]'$ Aphasia   '[]'$ Vissual changes   '[]'$ Weakness or numbness in arm   '[]'$ Weakness or numbness in leg Musculoskeletal:   '[]'$ Joint swelling   '[]'$ Joint pain   '[]'$ Low back pain Hematologic:  '[]'$ Easy bruising  '[]'$ Easy bleeding   '[]'$ Hypercoagulable state   '[]'$ Anemic Gastrointestinal:  '[]'$ Diarrhea   '[]'$ Vomiting  '[]'$ Gastroesophageal reflux/heartburn   '[]'$ Difficulty swallowing. Genitourinary:  '[]'$ Chronic kidney disease   '[]'$ Difficult urination  '[]'$ Frequent urination   '[]'$ Blood in urine Skin:  '[]'$ Rashes   '[]'$ Ulcers  Psychological:  '[]'$ History of anxiety   '[]'$  History of major depression.  Physical Examination  There were no vitals filed for this visit. There is no height or weight on file to calculate BMI. Gen: WD/WN, NAD Head: [place]/AT, No temporalis wasting.  Ear/Nose/Throat: Hearing grossly intact, nares w/o erythema or drainage Eyes: PER, EOMI, sclera nonicteric.  Neck: Supple, no masses.  No bruit or JVD.  Pulmonary:  Good air movement, no audible wheezing, no use of accessory muscles.  Cardiac: RRR, normal S1, S2, no Murmurs. Vascular:  carotid bruit noted Vessel Right Left  Radial Palpable Palpable  Carotid  Palpable  Palpable  Subclav  Palpable Palpable  Gastrointestinal: soft, non-distended. No guarding/no peritoneal signs.  Musculoskeletal: M/S 5/5 throughout.  No visible deformity.  Neurologic: CN 2-12 intact. Pain and light touch intact in extremities.  Symmetrical.  Speech is fluent. Motor exam as listed above. Psychiatric: Judgment intact, Mood & affect appropriate for pt's clinical situation. Dermatologic: No rashes or ulcers noted.  No changes consistent with  cellulitis.   CBC Lab Results  Component Value Date   WBC 7.3 12/31/2021   HGB 14.1 12/31/2021   HCT 41.9 12/31/2021   MCV 90.7 12/31/2021   PLT 234 12/31/2021    BMET    Component Value Date/Time   NA 140 03/23/2022 0941   NA 140 10/23/2011 1142   K 4.3 03/23/2022 0941   CL 107 03/23/2022 0941   CO2 24 03/23/2022 0941   GLUCOSE 95 03/23/2022 0941   BUN 17 03/23/2022 0941   BUN 20 10/23/2011 1142   CREATININE 1.32 03/23/2022 0941   CREATININE 1.26 (H) 12/22/2019 1508   CALCIUM 9.4 03/23/2022 0941   GFRNONAA 47 (L) 12/31/2021 0311   GFRAA 69 10/23/2011 1142   CrCl cannot be calculated (Patient's most recent lab result is older than the maximum 21 days allowed.).  COAG Lab Results  Component Value Date   INR 1.0 02/22/2021   INR 1.0 01/03/2021  Radiology No results found.   Assessment/Plan There are no diagnoses linked to this encounter.   Hortencia Pilar, MD  05/17/2022 8:33 PM

## 2022-05-18 ENCOUNTER — Encounter (INDEPENDENT_AMBULATORY_CARE_PROVIDER_SITE_OTHER): Payer: Self-pay | Admitting: Vascular Surgery

## 2022-05-18 ENCOUNTER — Ambulatory Visit (INDEPENDENT_AMBULATORY_CARE_PROVIDER_SITE_OTHER): Payer: Medicare Other

## 2022-05-18 ENCOUNTER — Ambulatory Visit (INDEPENDENT_AMBULATORY_CARE_PROVIDER_SITE_OTHER): Payer: Medicare Other | Admitting: Vascular Surgery

## 2022-05-18 VITALS — BP 138/87 | HR 50 | Resp 18 | Ht 72.0 in | Wt 212.0 lb

## 2022-05-18 DIAGNOSIS — I6522 Occlusion and stenosis of left carotid artery: Secondary | ICD-10-CM | POA: Diagnosis not present

## 2022-05-18 DIAGNOSIS — I700 Atherosclerosis of aorta: Secondary | ICD-10-CM

## 2022-05-18 DIAGNOSIS — I6523 Occlusion and stenosis of bilateral carotid arteries: Secondary | ICD-10-CM

## 2022-05-18 DIAGNOSIS — E785 Hyperlipidemia, unspecified: Secondary | ICD-10-CM

## 2022-05-18 DIAGNOSIS — I10 Essential (primary) hypertension: Secondary | ICD-10-CM

## 2022-05-19 ENCOUNTER — Other Ambulatory Visit: Payer: Self-pay

## 2022-05-19 DIAGNOSIS — I10 Essential (primary) hypertension: Secondary | ICD-10-CM

## 2022-05-19 MED ORDER — LOSARTAN POTASSIUM 50 MG PO TABS: 50.0000 mg | ORAL_TABLET | Freq: Every day | ORAL | 1 refills | Status: DC

## 2022-05-21 ENCOUNTER — Encounter (INDEPENDENT_AMBULATORY_CARE_PROVIDER_SITE_OTHER): Payer: Self-pay | Admitting: Vascular Surgery

## 2022-05-25 ENCOUNTER — Encounter: Payer: Self-pay | Admitting: Internal Medicine

## 2022-05-25 ENCOUNTER — Other Ambulatory Visit: Payer: Self-pay

## 2022-05-25 DIAGNOSIS — I10 Essential (primary) hypertension: Secondary | ICD-10-CM

## 2022-05-25 MED ORDER — LOSARTAN POTASSIUM 50 MG PO TABS: 100.0000 mg | ORAL_TABLET | Freq: Every day | ORAL | 2 refills | Status: DC

## 2022-05-27 DIAGNOSIS — G4733 Obstructive sleep apnea (adult) (pediatric): Secondary | ICD-10-CM | POA: Diagnosis not present

## 2022-06-19 ENCOUNTER — Other Ambulatory Visit (INDEPENDENT_AMBULATORY_CARE_PROVIDER_SITE_OTHER): Payer: Medicare Other

## 2022-06-19 DIAGNOSIS — E785 Hyperlipidemia, unspecified: Secondary | ICD-10-CM | POA: Diagnosis not present

## 2022-06-19 LAB — LIPID PANEL
Cholesterol: 120 mg/dL (ref 0–200)
HDL: 63.4 mg/dL (ref >39.00)
LDL Cholesterol: 45 mg/dL (ref 0–99)
NonHDL: 56.43
Total CHOL/HDL Ratio: 2
Triglycerides: 59 mg/dL (ref 0.0–149.0)
VLDL: 11.8 mg/dL (ref 0.0–40.0)

## 2022-06-19 LAB — COMPREHENSIVE METABOLIC PANEL
ALT: 24 U/L (ref 0–53)
AST: 23 U/L (ref 0–37)
Albumin: 3.9 g/dL (ref 3.5–5.2)
Alkaline Phosphatase: 89 U/L (ref 39–117)
BUN: 17 mg/dL (ref 6–23)
CO2: 25 mEq/L (ref 19–32)
Calcium: 9 mg/dL (ref 8.4–10.5)
Chloride: 108 mEq/L (ref 96–112)
Creatinine, Ser: 1.3 mg/dL (ref 0.40–1.50)
GFR: 52.07 mL/min — ABNORMAL LOW (ref >60.00)
Glucose, Bld: 88 mg/dL (ref 70–99)
Potassium: 4.5 mEq/L (ref 3.5–5.1)
Sodium: 140 mEq/L (ref 135–145)
Total Bilirubin: 1 mg/dL (ref 0.2–1.2)
Total Protein: 6 g/dL (ref 6.0–8.3)

## 2022-06-22 ENCOUNTER — Encounter: Payer: Self-pay | Admitting: Internal Medicine

## 2022-06-22 ENCOUNTER — Ambulatory Visit (INDEPENDENT_AMBULATORY_CARE_PROVIDER_SITE_OTHER): Payer: Medicare Other | Admitting: Internal Medicine

## 2022-06-22 VITALS — BP 124/86 | HR 50 | Temp 97.4°F | Ht 72.0 in | Wt 205.2 lb

## 2022-06-22 DIAGNOSIS — I6930 Unspecified sequelae of cerebral infarction: Secondary | ICD-10-CM

## 2022-06-22 DIAGNOSIS — R2689 Other abnormalities of gait and mobility: Secondary | ICD-10-CM

## 2022-06-22 DIAGNOSIS — I10 Essential (primary) hypertension: Secondary | ICD-10-CM

## 2022-06-22 DIAGNOSIS — G4733 Obstructive sleep apnea (adult) (pediatric): Secondary | ICD-10-CM

## 2022-06-22 DIAGNOSIS — E785 Hyperlipidemia, unspecified: Secondary | ICD-10-CM

## 2022-06-22 DIAGNOSIS — R419 Unspecified symptoms and signs involving cognitive functions and awareness: Secondary | ICD-10-CM

## 2022-06-22 MED ORDER — EZETIMIBE 10 MG PO TABS: 10.0000 mg | ORAL_TABLET | Freq: Every day | ORAL | 3 refills | Status: DC

## 2022-06-22 NOTE — Assessment & Plan Note (Signed)
Improved with resolution  of therapy with Praluent  Lab Results  Component Value Date   CHOL 120 06/19/2022   HDL 63.40 06/19/2022   LDLCALC 45 06/19/2022   LDLDIRECT 145.0 03/23/2022   TRIG 59.0 06/19/2022   CHOLHDL 2 06/19/2022

## 2022-06-22 NOTE — Assessment & Plan Note (Signed)
MMSE score as 29/30 .  Symptoms limited to remembering names, coming up with certain words.

## 2022-06-22 NOTE — Progress Notes (Signed)
Subjective:  Patient ID: Bobby Murillo, male    DOB: 07-26-1940  Age: 82 y.o. [name]: [id number]  CC: The primary encounter diagnosis was OSA (obstructive sleep apnea). Diagnoses of History of CVA with residual deficit, Loss of balance, Cognitive complaints with normal neuropsychological exam, Essential hypertension, and Hyperlipidemia LDL goal <100 were also pertinent to this visit.   HPI Bobby Murillo presents for  Chief Complaint  Patient presents with   Medical Management of Chronic Issues    3 month follow up and discuss memory   1)  HLD:   statin intolerant but taking Zetia  and Praluent with LDL < 70   2)  CVD: with persistently poor balance since the CVA ,  no longer playing tennis, balance improved  while he was participating in vestibular rehab .   NO recent falls,  but  looking up and turning around quickly makes him lose  balance   3) HTN: home bps have been infrequently checked.    Using losartan  100 mg daily .    4) difficulty retrieving words at times  and remembering names.   No trouble using remote,  cell phone,  driving.  Not forgetting meds.   MMSE 29/30  5) knee pain : using tylenol  6) OSA:  using CPAP "most of the time".  Puts it on every night , averages about 4-5 hours per night      Outpatient Medications Prior to Visit  Medication Sig Dispense Refill   acetaminophen (TYLENOL) 325 MG tablet Take 650 mg by mouth every 6 (six) hours as needed for moderate pain.     Alirocumab (PRALUENT) 150 MG/ML SOAJ Inject 150 mg into the skin every 14 (fourteen) days. 2 mL 6   ALPRAZolam (XANAX) 0.5 MG tablet Take 1 tablet (0.5 mg total) by mouth at bedtime as needed. for sleep 30 tablet 5   aspirin EC 81 MG tablet Take 81 mg by mouth daily. Swallow whole.     clopidogrel (PLAVIX) 75 MG tablet Take 1 tablet (75 mg total) by mouth daily. 30 tablet 6   Collagen-Vitamin C-Biotin (COLLAGEN 1500/C PO) Take by mouth.     Glucosamine-Chondroitin (GLUCOSAMINE CHONDR COMPLEX PO)  Take 1 tablet by mouth daily. tab by mouth daily     losartan (COZAAR) 50 MG tablet Take 2 tablets (100 mg total) by mouth daily. 180 tablet 2   polycarbophil (FIBERCON) 625 MG tablet Take 625 mg by mouth daily.     ezetimibe (ZETIA) 10 MG tablet Take 1 tablet (10 mg total) by mouth daily. 90 tablet 1   fluticasone (FLONASE) 50 MCG/ACT nasal spray Place 2 sprays into both nostrils daily. (Patient not taking: Reported on 06/22/2022) 16 g 6   No facility-administered medications prior to visit.    Review of Systems;  Patient denies headache, fevers, malaise, unintentional weight loss, skin rash, eye pain, sinus congestion and sinus pain, sore throat, dysphagia,  hemoptysis , cough, dyspnea, wheezing, chest pain, palpitations, orthopnea, edema, abdominal pain, nausea, melena, diarrhea, constipation, flank pain, dysuria, hematuria, urinary  Frequency, nocturia, numbness, tingling, seizures,  Focal weakness, Loss of consciousness,  Tremor, insomnia, depression, anxiety, and suicidal ideation.      Objective:  BP 124/86   Pulse (!) 50   Temp (!) 97.4 F (36.3 C) (Oral)   Ht 6' (1.829 m)   Wt 205 lb 3.2 oz (93.1 kg)   SpO2 97%   BMI 27.83 kg/m   BP Readings from Last 3  Encounters:  06/22/22 124/86  05/18/22 138/87  04/17/22 118/78    Wt Readings from Last 3 Encounters:  06/22/22 205 lb 3.2 oz (93.1 kg)  05/18/22 212 lb (96.2 kg)  04/17/22 212 lb (96.2 kg)    Physical Exam  Lab Results  Component Value Date   HGBA1C 6.2 05/27/2021   HGBA1C 6.0 (H) 01/03/2021   HGBA1C 5.8 06/09/2019    Lab Results  Component Value Date   CREATININE 1.30 06/19/2022   CREATININE 1.32 03/23/2022   CREATININE 1.51 (H) 12/31/2021    Lab Results  Component Value Date   WBC 7.3 12/31/2021   HGB 14.1 12/31/2021   HCT 41.9 12/31/2021   PLT 234 12/31/2021   GLUCOSE 88 06/19/2022   CHOL 120 06/19/2022   TRIG 59.0 06/19/2022   HDL 63.40 06/19/2022   LDLDIRECT 145.0 03/23/2022   LDLCALC 45  06/19/2022   ALT 24 06/19/2022   AST 23 06/19/2022   NA 140 06/19/2022   K 4.5 06/19/2022   CL 108 06/19/2022   CREATININE 1.30 06/19/2022   BUN 17 06/19/2022   CO2 25 06/19/2022   TSH 4.23 05/27/2021   PSA 0.00 (L) 05/27/2021   INR 1.0 02/22/2021   HGBA1C 6.2 05/27/2021   MICROALBUR <0.7 03/23/2022    PERIPHERAL VASCULAR CATHETERIZATION  Result Date: 12/30/2021 See surgical note for result.   Assessment & Plan:  .OSA (obstructive sleep apnea) Assessment & Plan: Averaging 4-5 hours per night .  It's usually off in the morning .     History of CVA with residual deficit -     Ambulatory referral to Physical Therapy  Loss of balance Assessment & Plan: Secondary to prior CVA.  Vestibular rehab ordered   Orders: -     Ambulatory referral to Physical Therapy  Cognitive complaints with normal neuropsychological exam Assessment & Plan: MMSE score as 29/30 .  Symptoms limited to remembering names, coming up with certain words.     Essential hypertension Assessment & Plan: Improved control on 50 mg daily.  Given his poor balance tighter control will increase his risk of falling    Hyperlipidemia LDL goal <100 Assessment & Plan: Improved with resolution  of therapy with Praluent  Lab Results  Component Value Date   CHOL 120 06/19/2022   HDL 63.40 06/19/2022   LDLCALC 45 06/19/2022   LDLDIRECT 145.0 03/23/2022   TRIG 59.0 06/19/2022   CHOLHDL 2 06/19/2022      Other orders -     Ezetimibe; Take 1 tablet (10 mg total) by mouth daily.  Dispense: 90 tablet; Refill: 3     I provided 44 minutes of face-to-face time during this encounter reviewing patient's last visit with me, patient's  most recent visit with cardiology,   recent surgical and non surgical procedures, previous  labs and imaging studies, counseling on currently addressed issues,  and post visit ordering to diagnostics and therapeutics .   Follow-up: Return in about 6 months (around  12/22/2022).   Sherlene Shams, MD

## 2022-06-22 NOTE — Assessment & Plan Note (Signed)
Improved control on 50 mg daily.  Given his poor balance tighter control will increase his risk of falling

## 2022-06-22 NOTE — Assessment & Plan Note (Signed)
Averaging 4-5 hours per night .  It's usually off in the morning .

## 2022-06-22 NOTE — Assessment & Plan Note (Signed)
Secondary to prior CVA.  Vestibular rehab ordered

## 2022-06-22 NOTE — Patient Instructions (Addendum)
Your memory is EXCELLENT.  Our brains have become lazy because of "smart phones"   I highly recommend reading "the End of Alzheimer's: The First Program to Prevent and Reverse Cognitive Decline"  by Dorice Lamas, MD   I will reorder the physical therapy referral for your balance

## 2022-06-27 DIAGNOSIS — G4733 Obstructive sleep apnea (adult) (pediatric): Secondary | ICD-10-CM | POA: Diagnosis not present

## 2022-07-01 DIAGNOSIS — G4733 Obstructive sleep apnea (adult) (pediatric): Secondary | ICD-10-CM | POA: Diagnosis not present

## 2022-07-13 ENCOUNTER — Other Ambulatory Visit (INDEPENDENT_AMBULATORY_CARE_PROVIDER_SITE_OTHER): Payer: Self-pay | Admitting: Vascular Surgery

## 2022-07-20 ENCOUNTER — Ambulatory Visit: Payer: Medicare Other | Attending: Internal Medicine

## 2022-07-20 DIAGNOSIS — R2681 Unsteadiness on feet: Secondary | ICD-10-CM | POA: Diagnosis not present

## 2022-07-20 DIAGNOSIS — M6281 Muscle weakness (generalized): Secondary | ICD-10-CM | POA: Insufficient documentation

## 2022-07-20 DIAGNOSIS — I6930 Unspecified sequelae of cerebral infarction: Secondary | ICD-10-CM | POA: Diagnosis not present

## 2022-07-20 DIAGNOSIS — R2689 Other abnormalities of gait and mobility: Secondary | ICD-10-CM | POA: Insufficient documentation

## 2022-07-20 DIAGNOSIS — R278 Other lack of coordination: Secondary | ICD-10-CM | POA: Diagnosis not present

## 2022-07-20 NOTE — Therapy (Signed)
OUTPATIENT PHYSICAL THERAPY NEURO EVALUATION   Patient Name: Bobby Murillo [name]: [id number] DOB:[birth year], 82 y.o., male Today's Date: 07/20/2022   PCP: Dr. Duncan Dull REFERRING PROVIDER: Dr. Duncan Dull  END OF SESSION:  PT End of Session - 07/20/22 0911     Visit Number 1    Number of Visits 24    Date for PT Re-Evaluation 10/12/22    Progress Note Due on Visit 10    PT Start Time 0903    PT Stop Time 0958    PT Time Calculation (min) 55 min    Equipment Utilized During Treatment Gait belt    Activity Tolerance Patient tolerated treatment well    Behavior During Therapy WFL for tasks assessed/performed             Past Medical History:  Diagnosis Date   Abnormal LFTs    Acute cerebrovascular accident (CVA) due to thrombosis of left middle cerebral artery (HCC) 01/05/2021   Anxiety    Aortic atherosclerosis (HCC)    Arthritis    Basal cell carcinoma 09/14/2010   left calf    Bilateral carotid artery stenosis    a.) Carotid doppler 01/03/2021: 70-99% LICA; 0-49% RICA. b.) CTA neck 01/30/2021: critical stenosis LEFT carotid bulb.   Carotid stenosis, symptomatic, with infarction (HCC) 01/20/2021   CKD (chronic kidney disease), stage III (HCC)    History of kidney stones    Hyperlipidemia    Hypertension    Long term current use of antithrombotics/antiplatelets    a.) DAPT therapy (ASA + clopidogrel)   Prostate cancer (HCC) 03/16/2008   Dr. Baxter Flattery   Sinus bradycardia by electrocardiogram    Stroke (HCC) 01/03/2021   a.) Acute infarction affecting the cortex. ?? incidental punctate acute white matter infarction just posterior to the atrium of the left lateral ventricle. Stenosis of the right M2 branch inferior division. Severe atherosclerotic disease of both PCAs with severe stenoses.   Past Surgical History:  Procedure Laterality Date   CAROTID PTA/STENT INTERVENTION Left 12/30/2021   Procedure: CAROTID PTA/STENT INTERVENTION;  Surgeon: Renford Dills, MD;   Location: [facility];  Service: Cardiovascular;  Laterality: Left;   CATARACT EXTRACTION Bilateral    05/2021   COLONOSCOPY     ENDARTERECTOMY Left 02/19/2021   Procedure: ENDARTERECTOMY CAROTID;  Surgeon: Renford Dills, MD;  Location: [facility];  Service: Vascular;  Laterality: Left;   PROSTATECTOMY  11/14/2008   transurethral, radical, Dr. Carin Primrose   Patient Active Problem List   Diagnosis Date Noted   Left nephrolithiasis 12/21/2021   OSA (obstructive sleep apnea) 11/28/2021   Memory deficit after cerebral infarction 05/12/2021   Proximal muscle weakness 05/12/2021   Carotid stenosis, symptomatic w/o infarct, left 02/19/2021   Hospital discharge follow-up 01/11/2021   History of CVA with residual deficit 01/11/2021   Internal carotid artery stenosis 01/05/2021   Atrial septal defect 01/05/2021   Anxiety    Leukocytosis    Loss of balance 12/23/2020   Arthritis of left shoulder region 10/30/2020   Tendonitis, Achilles, left 10/30/2020   Osteoarthritis of knees, bilateral 10/30/2020   Irregular heart rhythm 05/27/2020   Abdominal aortic atherosclerosis (HCC) 05/06/2020   Myalgia due to statin 05/06/2020   Vertigo 04/24/2020   Allergic rhinitis 03/06/2020   Stage 3a chronic kidney disease (HCC) 08/07/2019   Cognitive complaints with normal neuropsychological exam 02/21/2019   Polyarthralgia 02/08/2018   Erythrocytosis 02/08/2018   Insomnia due to psychological stress 02/02/2017   Encounter for preventive  health examination 02/02/2017   Essential hypertension 06/01/2015   Sinus bradycardia 06/01/2015   Encounter for Medicare annual wellness exam 12/02/2014   Degenerative joint disease of knee 02/15/2011   Hyperlipidemia LDL goal <100 02/15/2011   History of prostate cancer    Basal cell carcinoma     ONSET DATE: About 2 years ago- worse in past 4 months  REFERRING DIAG:  I69.30 (ICD-10-CM) - History of CVA with residual deficit  R26.89 (ICD-10-CM) - Loss of  balance    THERAPY DIAG:  Unsteadiness on feet  Muscle weakness (generalized)  Other lack of coordination  Rationale for Evaluation and Treatment: Rehabilitation  SUBJECTIVE:                                                                                                                                                                                             SUBJECTIVE STATEMENT: Patient reports he is back due to some ongoing balance issues- feels like he has regressed some since he was last in PT about  1 year ago. Denies any falls.  Pt accompanied by: self  PERTINENT HISTORY: Patient is a 82 year male referral from PCP with residual issues with balance due to CVA in past. Patient has a past medical history of carotid stent 10/23,  Abnormal LFTs, Anxiety, Aortic atherosclerosis (HCC), Arthritis, Basal cell carcinoma (09/14/2010), Bilateral carotid artery stenosis, CKD (chronic kidney disease), stage III (HCC), History of kidney stones, Hyperlipidemia, Hypertension, Long term current use of antithrombotics/antiplatelets, Prostate cancer (HCC) (03/16/2008), Sinus bradycardia by electrocardiogram, and Stroke (HCC) (01/03/2021).   PAIN:  Are you having pain? Yes: NPRS scale: 4/10 bilateral knee pain at worst; current=0/10 Pain location: Bilateral anterior knee pain Pain description: quick stabbing pain- intermittent Aggravating factors: prolonged walking; playing golf Relieving factors: Rest  PRECAUTIONS: Fall  WEIGHT BEARING RESTRICTIONS: No  FALLS: Has patient fallen in last 6 months? No  LIVING ENVIRONMENT: Lives with: lives with their spouse Lives in: House/apartment Stairs: Yes: Internal: 16 steps; on left going up Has following equipment at home: Single point cane and Walker - 2 wheeled  PLOF: Independent  PATIENT GOALS: Patient reports wanting to improve my balance.  OBJECTIVE:   DIAGNOSTIC FINDINGS: none recent  COGNITION: Overall cognitive status: Within  functional limits for tasks assessed   SENSATION: Patient reports some decreased sensation in left LE and right UE but intact with light touch today.   COORDINATION: Minimal delay- yet able to follow all commands.  Intact heel to shin  EDEMA:  None observed  MUSCLE TONE:  BLE- Within functional limits  DTRs:  Not tested  POSTURE: rounded shoulders and  forward head  LOWER EXTREMITY ROM:     Each LE - WNL with all Active ROM - hip/knee/ankle   LOWER EXTREMITY MMT:    MMT Right Eval Left Eval  Hip flexion 4+ 4+  Hip extension 4 4  Hip abduction 4+ 4+  Hip adduction 4+ 4+  Hip internal rotation    Hip external rotation    Knee flexion 5 5  Knee extension 4+* 4+*  Ankle dorsiflexion 4+ 4+  Ankle plantarflexion 4 4  Ankle inversion    Ankle eversion    (Blank rows = not tested)   GAIT: Gait pattern: antalgic Distance walked: 200+ Assistive device utilized: None Level of assistance: SBA Comments: mild unsteadiness with turning - reciprocal steps  FUNCTIONAL TESTS:  5 times sit to stand: 15.2 sec without UE support Timed up and go (TUG): 12.59 sec 10 meter walk test: 0.94 m/s Berg Balance Scale: Deferred to more dynamic balance test Dynamic Gait Index: deferred Function Gait assessment: 20/30   PATIENT SURVEYS:  FOTO 52  TODAY'S TREATMENT:                                                                                                                              DATE: 07/20/2022- PT Evaluation today    PATIENT EDUCATION: Education details: Purpose of PT; balance education into importance of muscle strength, sensory components of balance Person educated: Patient Education method: Explanation, Demonstration, Tactile cues, and Verbal cues Education comprehension: verbalized understanding, returned demonstration, verbal cues required, tactile cues required, and needs further education  HOME EXERCISE PROGRAM: To be initiated next 1-2  sessions  GOALS: Goals reviewed with patient? Yes  SHORT TERM GOALS: Target date: 08/31/2022  Patient will be independent in home exercise program to improve strength/mobility for better functional independence with ADLs.  Baseline: EVAL- No formal HEP in place Goal status: INITIAL    LONG TERM GOALS: Target date: 10/12/2022  Pt will increase by at least 0.13 m/s in order to demonstrate clinically significant improvement in community ambulation.   Baseline: EVAL= 0.94 m/s Goal status: INITIAL  2.  Patient will increase Functional Gait Assessment score to >23/30 as to reduce fall risk and improve dynamic gait safety with community ambulation.  Baseline: EVAL= 20/30 Goal status: INITIAL  3.  Patient will increase FOTO score to equal to or greater than 54 % to demonstrate statistically significant improvement in mobility and quality of life.  Baseline: EVAL= 52 Goal status: INITIAL  4.  Pt will decrease 5TSTS by at least 3 seconds in order to demonstrate clinically significant improvement in LE strength. Baseline: EVAL= 15.2 sec without UE Support Goal status: INITIAL    ASSESSMENT:  CLINICAL IMPRESSION: Patient is a 82 y.o. male who was seen today for physical therapy evaluation and treatment for history of CVA with residual effects and imbalance. Patient was seen by outpatient PT approx 1 year ago with positive results. Patient did have CVA in 2022  and resultant difficulty balance. Based on today's evaluation- he presents with min B LE muscle weakness, some unsteadiness with mobility and at increased risk of falling with balance impairments. He will benefit from PT skilled interventions to improve his functional strength and balance to assist with all community mobility and recreation like golf.   OBJECTIVE IMPAIRMENTS: Abnormal gait, decreased activity tolerance, decreased balance, decreased coordination, decreased endurance, decreased mobility, difficulty walking, decreased  strength, impaired flexibility, and pain.   ACTIVITY LIMITATIONS: carrying, lifting, bending, standing, squatting, and stairs  PARTICIPATION LIMITATIONS: cleaning, shopping, community activity, and yard work  PERSONAL FACTORS: Age and 3+ comorbidities: HTN, CKD, Prostate cancer  are also affecting patient's functional outcome.   REHAB POTENTIAL: Good  CLINICAL DECISION MAKING: Stable/uncomplicated  EVALUATION COMPLEXITY: Low  PLAN:  PT FREQUENCY: 1-2x/week  PT DURATION: 12 weeks  PLANNED INTERVENTIONS: Therapeutic exercises, Therapeutic activity, Neuromuscular re-education, Balance training, Gait training, Patient/Family education, Self Care, Joint mobilization, Stair training, Vestibular training, Canalith repositioning, DME instructions, Dry Needling, Spinal manipulation, Spinal mobilization, Cryotherapy, Moist heat, Taping, Ultrasound, and Manual therapy  PLAN FOR NEXT SESSION: Begin with higher level balance training and add to HEP as appropriate.    Lenda Kelp, PT 07/20/2022, 10:04 AM

## 2022-07-24 ENCOUNTER — Ambulatory Visit: Payer: Medicare Other

## 2022-07-24 DIAGNOSIS — R2681 Unsteadiness on feet: Secondary | ICD-10-CM | POA: Diagnosis not present

## 2022-07-24 DIAGNOSIS — R278 Other lack of coordination: Secondary | ICD-10-CM | POA: Diagnosis not present

## 2022-07-24 DIAGNOSIS — M6281 Muscle weakness (generalized): Secondary | ICD-10-CM | POA: Diagnosis not present

## 2022-07-24 DIAGNOSIS — R2689 Other abnormalities of gait and mobility: Secondary | ICD-10-CM | POA: Diagnosis not present

## 2022-07-24 DIAGNOSIS — I6930 Unspecified sequelae of cerebral infarction: Secondary | ICD-10-CM | POA: Diagnosis not present

## 2022-07-24 NOTE — Therapy (Signed)
OUTPATIENT PHYSICAL THERAPY NEURO TREATMENT   Patient Name: Bobby Murillo [name]: [id number] DOB:12/02/1940, 82 y.o., male Today's Date: 07/20/2022   PCP: Dr. Duncan Dull REFERRING PROVIDER: Dr. Duncan Dull  END OF SESSION:  PT End of Session - 07/20/22 0911     Visit Number 1    Number of Visits 24    Date for PT Re-Evaluation 10/12/22    Progress Note Due on Visit 10    PT Start Time 0903    PT Stop Time 0958    PT Time Calculation (min) 55 min    Equipment Utilized During Treatment Gait belt    Activity Tolerance Patient tolerated treatment well    Behavior During Therapy WFL for tasks assessed/performed             Past Medical History:  Diagnosis Date   Abnormal LFTs    Acute cerebrovascular accident (CVA) due to thrombosis of left middle cerebral artery (HCC) 01/05/2021   Anxiety    Aortic atherosclerosis (HCC)    Arthritis    Basal cell carcinoma 09/14/2010   left calf    Bilateral carotid artery stenosis    a.) Carotid doppler 01/03/2021: 70-99% LICA; 0-49% RICA. b.) CTA neck 01/30/2021: critical stenosis LEFT carotid bulb.   Carotid stenosis, symptomatic, with infarction (HCC) 01/20/2021   CKD (chronic kidney disease), stage III (HCC)    History of kidney stones    Hyperlipidemia    Hypertension    Long term current use of antithrombotics/antiplatelets    a.) DAPT therapy (ASA + clopidogrel)   Prostate cancer (HCC) 03/16/2008   Dr. Baxter Flattery   Sinus bradycardia by electrocardiogram    Stroke (HCC) 01/03/2021   a.) Acute infarction affecting the cortex. ?? incidental punctate acute white matter infarction just posterior to the atrium of the left lateral ventricle. Stenosis of the right M2 branch inferior division. Severe atherosclerotic disease of both PCAs with severe stenoses.   Past Surgical History:  Procedure Laterality Date   CAROTID PTA/STENT INTERVENTION Left 12/30/2021   Procedure: CAROTID PTA/STENT INTERVENTION;  Surgeon: Renford Dills, MD;   Location: [facility];  Service: Cardiovascular;  Laterality: Left;   CATARACT EXTRACTION Bilateral    05/2021   COLONOSCOPY     ENDARTERECTOMY Left 02/19/2021   Procedure: ENDARTERECTOMY CAROTID;  Surgeon: Renford Dills, MD;  Location: [facility];  Service: Vascular;  Laterality: Left;   PROSTATECTOMY  11/14/2008   transurethral, radical, Dr. Carin Primrose   Patient Active Problem List   Diagnosis Date Noted   Left nephrolithiasis 12/21/2021   OSA (obstructive sleep apnea) 11/28/2021   Memory deficit after cerebral infarction 05/12/2021   Proximal muscle weakness 05/12/2021   Carotid stenosis, symptomatic w/o infarct, left 02/19/2021   Hospital discharge follow-up 01/11/2021   History of CVA with residual deficit 01/11/2021   Internal carotid artery stenosis 01/05/2021   Atrial septal defect 01/05/2021   Anxiety    Leukocytosis    Loss of balance 12/23/2020   Arthritis of left shoulder region 10/30/2020   Tendonitis, Achilles, left 10/30/2020   Osteoarthritis of knees, bilateral 10/30/2020   Irregular heart rhythm 05/27/2020   Abdominal aortic atherosclerosis (HCC) 05/06/2020   Myalgia due to statin 05/06/2020   Vertigo 04/24/2020   Allergic rhinitis 03/06/2020   Stage 3a chronic kidney disease (HCC) 08/07/2019   Cognitive complaints with normal neuropsychological exam 02/21/2019   Polyarthralgia 02/08/2018   Erythrocytosis 02/08/2018   Insomnia due to psychological stress 02/02/2017   Encounter for preventive  health examination 02/02/2017   Essential hypertension 06/01/2015   Sinus bradycardia 06/01/2015   Encounter for Medicare annual wellness exam 12/02/2014   Degenerative joint disease of knee 02/15/2011   Hyperlipidemia LDL goal <100 02/15/2011   History of prostate cancer    Basal cell carcinoma     ONSET DATE: About 2 years ago- worse in past 4 months  REFERRING DIAG:  I69.30 (ICD-10-CM) - History of CVA with residual deficit  R26.89 (ICD-10-CM) - Loss of  balance    THERAPY DIAG:  Unsteadiness on feet  Muscle weakness (generalized)  Other lack of coordination  Rationale for Evaluation and Treatment: Rehabilitation  SUBJECTIVE:                                                                                                                                                                                             SUBJECTIVE STATEMENT: Patient reports doing okay- states tried to play golf yesterday and reports increased overall soreness.  Denies any falls.  Pt accompanied by: self  PERTINENT HISTORY: Patient is a 82 year male referral from PCP with residual issues with balance due to CVA in past. Patient has a past medical history of carotid stent 10/23,  Abnormal LFTs, Anxiety, Aortic atherosclerosis (HCC), Arthritis, Basal cell carcinoma (09/14/2010), Bilateral carotid artery stenosis, CKD (chronic kidney disease), stage III (HCC), History of kidney stones, Hyperlipidemia, Hypertension, Long term current use of antithrombotics/antiplatelets, Prostate cancer (HCC) (03/16/2008), Sinus bradycardia by electrocardiogram, and Stroke (HCC) (01/03/2021).   PAIN:  Are you having pain? Yes: NPRS scale: 4/10 bilateral knee pain at worst; current=0/10 Pain location: Bilateral anterior knee pain Pain description: quick stabbing pain- intermittent Aggravating factors: prolonged walking; playing golf Relieving factors: Rest  PRECAUTIONS: Fall  WEIGHT BEARING RESTRICTIONS: No  FALLS: Has patient fallen in last 6 months? No  LIVING ENVIRONMENT: Lives with: lives with their spouse Lives in: House/apartment Stairs: Yes: Internal: 16 steps; on left going up Has following equipment at home: Single point cane and Walker - 2 wheeled  PLOF: Independent  PATIENT GOALS: Patient reports wanting to improve my balance.  OBJECTIVE:   DIAGNOSTIC FINDINGS: none recent  COGNITION: Overall cognitive status: Within functional limits for tasks  assessed   SENSATION: Patient reports some decreased sensation in left LE and right UE but intact with light touch today.   COORDINATION: Minimal delay- yet able to follow all commands.  Intact heel to shin  EDEMA:  None observed  MUSCLE TONE:  BLE- Within functional limits  DTRs:  Not tested  POSTURE: rounded shoulders and forward head  LOWER EXTREMITY ROM:     Each LE -  WNL with all Active ROM - hip/knee/ankle   LOWER EXTREMITY MMT:    MMT Right Eval Left Eval  Hip flexion 4+ 4+  Hip extension 4 4  Hip abduction 4+ 4+  Hip adduction 4+ 4+  Hip internal rotation    Hip external rotation    Knee flexion 5 5  Knee extension 4+* 4+*  Ankle dorsiflexion 4+ 4+  Ankle plantarflexion 4 4  Ankle inversion    Ankle eversion    (Blank rows = not tested)   GAIT: Gait pattern: antalgic Distance walked: 200+ Assistive device utilized: None Level of assistance: SBA Comments: mild unsteadiness with turning - reciprocal steps  FUNCTIONAL TESTS:  5 times sit to stand: 15.2 sec without UE support Timed up and go (TUG): 12.59 sec 10 meter walk test: 0.94 m/s Berg Balance Scale: Deferred to more dynamic balance test Dynamic Gait Index: deferred Function Gait assessment: 20/30   PATIENT SURVEYS:  FOTO 52  TODAY'S TREATMENT:                                                                                                                              DATE: 07/24/2022-  NMR:  -Dynamic high knee march on airex pad-20 reps without UE support (Patient reports medium hard)   -Forward/retro step over 1/2 foam x 20 reps (patient initially with some difficulty with retro- improved with reps)  - Side step up/over 1/2 foam without UE support x 20 reps (patient reported- Easier than going forward/backward)   -Tandem standing - multiple attempts- able to hold x 30 sec x 2  - Tandem standing on 1/2 foam- much more hip righting reaction- at best able to hold 20 sec.   - Static  stand (back foot on airex pad and front foot on 6" step stool) while performing dynamic head movements - Head turns and nods x 5.   - SLS - at best 18 sec on left and 20 sec on right  THEREX:   SIT to stand- x 10 reps with arms across chest  Stand calf raises on 1/2 foam with 2 sec hold.    PATIENT EDUCATION: Education details: Purpose of PT; balance education into importance of muscle strength, sensory components of balance Person educated: Patient Education method: Explanation, Demonstration, Tactile cues, and Verbal cues Education comprehension: verbalized understanding, returned demonstration, verbal cues required, tactile cues required, and needs further education  HOME EXERCISE PROGRAM: To be initiated next 1-2 sessions  GOALS: Goals reviewed with patient? Yes  SHORT TERM GOALS: Target date: 08/31/2022  Patient will be independent in home exercise program to improve strength/mobility for better functional independence with ADLs.  Baseline: EVAL- No formal HEP in place Goal status: INITIAL    LONG TERM GOALS: Target date: 10/12/2022  Pt will increase by at least 0.13 m/s in order to demonstrate clinically significant improvement in community ambulation.   Baseline: EVAL= 0.94 m/s Goal status: INITIAL  2.  Patient  will increase Functional Gait Assessment score to >23/30 as to reduce fall risk and improve dynamic gait safety with community ambulation.  Baseline: EVAL= 20/30 Goal status: INITIAL  3.  Patient will increase FOTO score to equal to or greater than 54 % to demonstrate statistically significant improvement in mobility and quality of life.  Baseline: EVAL= 52 Goal status: INITIAL  4.  Pt will decrease 5TSTS by at least 3 seconds in order to demonstrate clinically significant improvement in LE strength. Baseline: EVAL= 15.2 sec without UE Support Goal status: INITIAL    ASSESSMENT:  CLINICAL IMPRESSION:  Patient presents for his second visit.  Treatment primarily focused on his balance today. Overall he performed very well- able to progress quickly to more higher level balance activities and demonstrating improving balance with practice today. He will benefit from PT skilled interventions to improve his functional strength and balance to assist with all community mobility and recreation like golf.   OBJECTIVE IMPAIRMENTS: Abnormal gait, decreased activity tolerance, decreased balance, decreased coordination, decreased endurance, decreased mobility, difficulty walking, decreased strength, impaired flexibility, and pain.   ACTIVITY LIMITATIONS: carrying, lifting, bending, standing, squatting, and stairs  PARTICIPATION LIMITATIONS: cleaning, shopping, community activity, and yard work  PERSONAL FACTORS: Age and 3+ comorbidities: HTN, CKD, Prostate cancer  are also affecting patient's functional outcome.   REHAB POTENTIAL: Good  CLINICAL DECISION MAKING: Stable/uncomplicated  EVALUATION COMPLEXITY: Low  PLAN:  PT FREQUENCY: 1-2x/week  PT DURATION: 12 weeks  PLANNED INTERVENTIONS: Therapeutic exercises, Therapeutic activity, Neuromuscular re-education, Balance training, Gait training, Patient/Family education, Self Care, Joint mobilization, Stair training, Vestibular training, Canalith repositioning, DME instructions, Dry Needling, Spinal manipulation, Spinal mobilization, Cryotherapy, Moist heat, Taping, Ultrasound, and Manual therapy  PLAN FOR NEXT SESSION: continue with higher level balance training and add to HEP as appropriate.    Lenda Kelp, PT 07/20/2022, 10:04 AM

## 2022-07-27 ENCOUNTER — Ambulatory Visit: Payer: Medicare Other

## 2022-07-27 DIAGNOSIS — R2689 Other abnormalities of gait and mobility: Secondary | ICD-10-CM

## 2022-07-27 DIAGNOSIS — M6281 Muscle weakness (generalized): Secondary | ICD-10-CM | POA: Diagnosis not present

## 2022-07-27 DIAGNOSIS — R278 Other lack of coordination: Secondary | ICD-10-CM | POA: Diagnosis not present

## 2022-07-27 DIAGNOSIS — G4733 Obstructive sleep apnea (adult) (pediatric): Secondary | ICD-10-CM | POA: Diagnosis not present

## 2022-07-27 DIAGNOSIS — I6930 Unspecified sequelae of cerebral infarction: Secondary | ICD-10-CM | POA: Diagnosis not present

## 2022-07-27 DIAGNOSIS — R2681 Unsteadiness on feet: Secondary | ICD-10-CM | POA: Diagnosis not present

## 2022-07-27 NOTE — Therapy (Signed)
OUTPATIENT PHYSICAL THERAPY NEURO TREATMENT   Patient Name: Bobby Murillo [name]: [id number] DOB:Jul 15, 1940, 82 y.o., male Today's Date: 07/27/2022   PCP: Dr. Duncan Dull REFERRING PROVIDER: Dr. Duncan Dull  END OF SESSION:    Past Medical History:  Diagnosis Date   Abnormal LFTs    Acute cerebrovascular accident (CVA) due to thrombosis of left middle cerebral artery (HCC) 01/05/2021   Anxiety    Aortic atherosclerosis (HCC)    Arthritis    Basal cell carcinoma 09/14/2010   left calf    Bilateral carotid artery stenosis    a.) Carotid doppler 01/03/2021: 70-99% LICA; 0-49% RICA. b.) CTA neck 01/30/2021: critical stenosis LEFT carotid bulb.   Carotid stenosis, symptomatic, with infarction (HCC) 01/20/2021   CKD (chronic kidney disease), stage III (HCC)    History of kidney stones    Hyperlipidemia    Hypertension    Long term current use of antithrombotics/antiplatelets    a.) DAPT therapy (ASA + clopidogrel)   Prostate cancer (HCC) 03/16/2008   Dr. Baxter Flattery   Sinus bradycardia by electrocardiogram    Stroke (HCC) 01/03/2021   a.) Acute infarction affecting the cortex. ?? incidental punctate acute white matter infarction just posterior to the atrium of the left lateral ventricle. Stenosis of the right M2 branch inferior division. Severe atherosclerotic disease of both PCAs with severe stenoses.   Past Surgical History:  Procedure Laterality Date   CAROTID PTA/STENT INTERVENTION Left 12/30/2021   Procedure: CAROTID PTA/STENT INTERVENTION;  Surgeon: Renford Dills, MD;  Location: [facility];  Service: Cardiovascular;  Laterality: Left;   CATARACT EXTRACTION Bilateral    05/2021   COLONOSCOPY     ENDARTERECTOMY Left 02/19/2021   Procedure: ENDARTERECTOMY CAROTID;  Surgeon: Renford Dills, MD;  Location: [facility];  Service: Vascular;  Laterality: Left;   PROSTATECTOMY  11/14/2008   transurethral, radical, Dr. Carin Primrose   Patient Active Problem List   Diagnosis  Date Noted   Left nephrolithiasis 12/21/2021   OSA (obstructive sleep apnea) 11/28/2021   Memory deficit after cerebral infarction 05/12/2021   Proximal muscle weakness 05/12/2021   Carotid stenosis, symptomatic w/o infarct, left 02/19/2021   Hospital discharge follow-up 01/11/2021   History of CVA with residual deficit 01/11/2021   Internal carotid artery stenosis 01/05/2021   Atrial septal defect 01/05/2021   Anxiety    Leukocytosis    Loss of balance 12/23/2020   Arthritis of left shoulder region 10/30/2020   Tendonitis, Achilles, left 10/30/2020   Osteoarthritis of knees, bilateral 10/30/2020   Irregular heart rhythm 05/27/2020   Abdominal aortic atherosclerosis (HCC) 05/06/2020   Myalgia due to statin 05/06/2020   Vertigo 04/24/2020   Allergic rhinitis 03/06/2020   Stage 3a chronic kidney disease (HCC) 08/07/2019   Cognitive complaints with normal neuropsychological exam 02/21/2019   Polyarthralgia 02/08/2018   Erythrocytosis 02/08/2018   Insomnia due to psychological stress 02/02/2017   Encounter for preventive health examination 02/02/2017   Essential hypertension 06/01/2015   Sinus bradycardia 06/01/2015   Encounter for Medicare annual wellness exam 12/02/2014   Degenerative joint disease of knee 02/15/2011   Hyperlipidemia LDL goal <100 02/15/2011   History of prostate cancer    Basal cell carcinoma     ONSET DATE: About 2 years ago- worse in past 4 months  REFERRING DIAG:  I69.30 (ICD-10-CM) - History of CVA with residual deficit  R26.89 (ICD-10-CM) - Loss of balance    THERAPY DIAG:  No diagnosis found.  Rationale for Evaluation and Treatment:  Rehabilitation  SUBJECTIVE:                                                                                                                                                                                             SUBJECTIVE STATEMENT: Patient reports doing well overall without any new issues- no Falls.   Pt  accompanied by: self  PERTINENT HISTORY: Patient is a 82 year male referral from PCP with residual issues with balance due to CVA in past. Patient has a past medical history of carotid stent 10/23,  Abnormal LFTs, Anxiety, Aortic atherosclerosis (HCC), Arthritis, Basal cell carcinoma (09/14/2010), Bilateral carotid artery stenosis, CKD (chronic kidney disease), stage III (HCC), History of kidney stones, Hyperlipidemia, Hypertension, Long term current use of antithrombotics/antiplatelets, Prostate cancer (HCC) (03/16/2008), Sinus bradycardia by electrocardiogram, and Stroke (HCC) (01/03/2021).   PAIN:  Are you having pain? Yes: NPRS scale: 4/10 bilateral knee pain at worst; current=0/10 Pain location: Bilateral anterior knee pain Pain description: quick stabbing pain- intermittent Aggravating factors: prolonged walking; playing golf Relieving factors: Rest  PRECAUTIONS: Fall  WEIGHT BEARING RESTRICTIONS: No  FALLS: Has patient fallen in last 6 months? No  LIVING ENVIRONMENT: Lives with: lives with their spouse Lives in: House/apartment Stairs: Yes: Internal: 16 steps; on left going up Has following equipment at home: Single point cane and Walker - 2 wheeled  PLOF: Independent  PATIENT GOALS: Patient reports wanting to improve my balance.  OBJECTIVE:   DIAGNOSTIC FINDINGS: none recent  COGNITION: Overall cognitive status: Within functional limits for tasks assessed   SENSATION: Patient reports some decreased sensation in left LE and right UE but intact with light touch today.   COORDINATION: Minimal delay- yet able to follow all commands.  Intact heel to shin  EDEMA:  None observed  MUSCLE TONE:  BLE- Within functional limits  DTRs:  Not tested  POSTURE: rounded shoulders and forward head  LOWER EXTREMITY ROM:     Each LE - WNL with all Active ROM - hip/knee/ankle   LOWER EXTREMITY MMT:    MMT Right Eval Left Eval  Hip flexion 4+ 4+  Hip extension 4 4  Hip  abduction 4+ 4+  Hip adduction 4+ 4+  Hip internal rotation    Hip external rotation    Knee flexion 5 5  Knee extension 4+* 4+*  Ankle dorsiflexion 4+ 4+  Ankle plantarflexion 4 4  Ankle inversion    Ankle eversion    (Blank rows = not tested)   GAIT: Gait pattern: antalgic Distance walked: 200+ Assistive device utilized: None Level of assistance: SBA Comments: mild unsteadiness with turning - reciprocal steps  FUNCTIONAL TESTS:  5 times sit to stand: 15.2 sec without UE support Timed up and go (TUG): 12.59 sec 10 meter walk test: 0.94 m/s Berg Balance Scale: Deferred to more dynamic balance test Dynamic Gait Index: deferred Function Gait assessment: 20/30   PATIENT SURVEYS:  FOTO 52  TODAY'S TREATMENT:                                                                                                                              DATE: 07/27/2022-  NMR:  -Dynamic high knee march on airex pad-20 reps without UE support (Patient reports medium hard)   -Standing on airex pad in various feet positions while arranging letters in alphabetical order    - Side step up/over 1/2 foam and 2 orange hurdles without UE support down and back x 8 forward/backward)   - Tandem standing on 1/2 foam- much more hip righting reaction- at best able to hold 20 sec.    THEREX:   SIT to stand- x 10 reps with arms holding 5kg ball across chest  Stand calf raises on 1/2 foam with 2 sec hold x 15 rpes  Standing RDL with 15lb kettlebell x 12 reps each LE  Standing single leg calf raises  on 1/2 foam x 15 reps   PATIENT EDUCATION: Education details: Purpose of PT; balance education into importance of muscle strength, sensory components of balance Person educated: Patient Education method: Explanation, Demonstration, Tactile cues, and Verbal cues Education comprehension: verbalized understanding, returned demonstration, verbal cues required, tactile cues required, and needs further  education  HOME EXERCISE PROGRAM: To be initiated next 1-2 sessions  GOALS: Goals reviewed with patient? Yes  SHORT TERM GOALS: Target date: 08/31/2022  Patient will be independent in home exercise program to improve strength/mobility for better functional independence with ADLs.  Baseline: EVAL- No formal HEP in place Goal status: INITIAL    LONG TERM GOALS: Target date: 10/12/2022  Pt will increase by at least 0.13 m/s in order to demonstrate clinically significant improvement in community ambulation.   Baseline: EVAL= 0.94 m/s Goal status: INITIAL  2.  Patient will increase Functional Gait Assessment score to >23/30 as to reduce fall risk and improve dynamic gait safety with community ambulation.  Baseline: EVAL= 20/30 Goal status: INITIAL  3.  Patient will increase FOTO score to equal to or greater than 54 % to demonstrate statistically significant improvement in mobility and quality of life.  Baseline: EVAL= 52 Goal status: INITIAL  4.  Pt will decrease 5TSTS by at least 3 seconds in order to demonstrate clinically significant improvement in LE strength. Baseline: EVAL= 15.2 sec without UE Support Goal status: INITIAL    ASSESSMENT:  CLINICAL IMPRESSION:  Patient presents with some challenge with more narrowed stance including near tandem on foam and later on 1/2 foam. He was also fatigued some with lower extremity strengthening and will continue to benefit in future visits. Patient will benefit from PT  skilled interventions to improve his functional strength and balance to assist with all community mobility and recreation like golf.   OBJECTIVE IMPAIRMENTS: Abnormal gait, decreased activity tolerance, decreased balance, decreased coordination, decreased endurance, decreased mobility, difficulty walking, decreased strength, impaired flexibility, and pain.   ACTIVITY LIMITATIONS: carrying, lifting, bending, standing, squatting, and stairs  PARTICIPATION  LIMITATIONS: cleaning, shopping, community activity, and yard work  PERSONAL FACTORS: Age and 3+ comorbidities: HTN, CKD, Prostate cancer  are also affecting patient's functional outcome.   REHAB POTENTIAL: Good  CLINICAL DECISION MAKING: Stable/uncomplicated  EVALUATION COMPLEXITY: Low  PLAN:  PT FREQUENCY: 1-2x/week  PT DURATION: 12 weeks  PLANNED INTERVENTIONS: Therapeutic exercises, Therapeutic activity, Neuromuscular re-education, Balance training, Gait training, Patient/Family education, Self Care, Joint mobilization, Stair training, Vestibular training, Canalith repositioning, DME instructions, Dry Needling, Spinal manipulation, Spinal mobilization, Cryotherapy, Moist heat, Taping, Ultrasound, and Manual therapy  PLAN FOR NEXT SESSION: continue with higher level balance training and add to HEP as appropriate.    Lenda Kelp, PT 07/27/2022, 7:50 AM

## 2022-07-31 ENCOUNTER — Ambulatory Visit: Payer: Medicare Other

## 2022-07-31 DIAGNOSIS — I6930 Unspecified sequelae of cerebral infarction: Secondary | ICD-10-CM | POA: Diagnosis not present

## 2022-07-31 DIAGNOSIS — R278 Other lack of coordination: Secondary | ICD-10-CM

## 2022-07-31 DIAGNOSIS — R2681 Unsteadiness on feet: Secondary | ICD-10-CM | POA: Diagnosis not present

## 2022-07-31 DIAGNOSIS — M6281 Muscle weakness (generalized): Secondary | ICD-10-CM

## 2022-07-31 DIAGNOSIS — R2689 Other abnormalities of gait and mobility: Secondary | ICD-10-CM | POA: Diagnosis not present

## 2022-07-31 NOTE — Therapy (Signed)
OUTPATIENT PHYSICAL THERAPY NEURO TREATMENT   Patient Name: Bobby Murillo [name]: [id number] DOB:09-06-1940, 82 y.o., male Today's Date: 07/31/2022   PCP: Dr. Duncan Dull REFERRING PROVIDER: Dr. Duncan Dull  END OF SESSION:  PT End of Session - 07/31/22 0851     Visit Number 4    Number of Visits 24    Date for PT Re-Evaluation 10/12/22    Progress Note Due on Visit 10    PT Start Time 0846    PT Stop Time 0930    PT Time Calculation (min) 44 min    Equipment Utilized During Treatment Gait belt    Activity Tolerance Patient tolerated treatment well    Behavior During Therapy WFL for tasks assessed/performed              Past Medical History:  Diagnosis Date   Abnormal LFTs    Acute cerebrovascular accident (CVA) due to thrombosis of left middle cerebral artery (HCC) 01/05/2021   Anxiety    Aortic atherosclerosis (HCC)    Arthritis    Basal cell carcinoma 09/14/2010   left calf    Bilateral carotid artery stenosis    a.) Carotid doppler 01/03/2021: 70-99% LICA; 0-49% RICA. b.) CTA neck 01/30/2021: critical stenosis LEFT carotid bulb.   Carotid stenosis, symptomatic, with infarction (HCC) 01/20/2021   CKD (chronic kidney disease), stage III (HCC)    History of kidney stones    Hyperlipidemia    Hypertension    Long term current use of antithrombotics/antiplatelets    a.) DAPT therapy (ASA + clopidogrel)   Prostate cancer (HCC) 03/16/2008   Dr. Baxter Flattery   Sinus bradycardia by electrocardiogram    Stroke (HCC) 01/03/2021   a.) Acute infarction affecting the cortex. ?? incidental punctate acute white matter infarction just posterior to the atrium of the left lateral ventricle. Stenosis of the right M2 branch inferior division. Severe atherosclerotic disease of both PCAs with severe stenoses.   Past Surgical History:  Procedure Laterality Date   CAROTID PTA/STENT INTERVENTION Left 12/30/2021   Procedure: CAROTID PTA/STENT INTERVENTION;  Surgeon: Renford Dills, MD;   Location: [facility];  Service: Cardiovascular;  Laterality: Left;   CATARACT EXTRACTION Bilateral    05/2021   COLONOSCOPY     ENDARTERECTOMY Left 02/19/2021   Procedure: ENDARTERECTOMY CAROTID;  Surgeon: Renford Dills, MD;  Location: [facility];  Service: Vascular;  Laterality: Left;   PROSTATECTOMY  11/14/2008   transurethral, radical, Dr. Carin Primrose   Patient Active Problem List   Diagnosis Date Noted   Left nephrolithiasis 12/21/2021   OSA (obstructive sleep apnea) 11/28/2021   Memory deficit after cerebral infarction 05/12/2021   Proximal muscle weakness 05/12/2021   Carotid stenosis, symptomatic w/o infarct, left 02/19/2021   Hospital discharge follow-up 01/11/2021   History of CVA with residual deficit 01/11/2021   Internal carotid artery stenosis 01/05/2021   Atrial septal defect 01/05/2021   Anxiety    Leukocytosis    Loss of balance 12/23/2020   Arthritis of left shoulder region 10/30/2020   Tendonitis, Achilles, left 10/30/2020   Osteoarthritis of knees, bilateral 10/30/2020   Irregular heart rhythm 05/27/2020   Abdominal aortic atherosclerosis (HCC) 05/06/2020   Myalgia due to statin 05/06/2020   Vertigo 04/24/2020   Allergic rhinitis 03/06/2020   Stage 3a chronic kidney disease (HCC) 08/07/2019   Cognitive complaints with normal neuropsychological exam 02/21/2019   Polyarthralgia 02/08/2018   Erythrocytosis 02/08/2018   Insomnia due to psychological stress 02/02/2017   Encounter for  preventive health examination 02/02/2017   Essential hypertension 06/01/2015   Sinus bradycardia 06/01/2015   Encounter for Medicare annual wellness exam 12/02/2014   Degenerative joint disease of knee 02/15/2011   Hyperlipidemia LDL goal <100 02/15/2011   History of prostate cancer    Basal cell carcinoma     ONSET DATE: About 2 years ago- worse in past 4 months  REFERRING DIAG:  I69.30 (ICD-10-CM) - History of CVA with residual deficit  R26.89 (ICD-10-CM) - Loss of  balance    THERAPY DIAG:  Unsteadiness on feet  Muscle weakness (generalized)  Other lack of coordination  Other abnormalities of gait and mobility  Rationale for Evaluation and Treatment: Rehabilitation  SUBJECTIVE:                                                                                                                                                                                             SUBJECTIVE STATEMENT: Patient reports fatigued after playing golf in wet conditions yesterday. Stated no knee pain today.   Pt accompanied by: self  PERTINENT HISTORY: Patient is a 82 year male referral from PCP with residual issues with balance due to CVA in past. Patient has a past medical history of carotid stent 10/23,  Abnormal LFTs, Anxiety, Aortic atherosclerosis (HCC), Arthritis, Basal cell carcinoma (09/14/2010), Bilateral carotid artery stenosis, CKD (chronic kidney disease), stage III (HCC), History of kidney stones, Hyperlipidemia, Hypertension, Long term current use of antithrombotics/antiplatelets, Prostate cancer (HCC) (03/16/2008), Sinus bradycardia by electrocardiogram, and Stroke (HCC) (01/03/2021).   PAIN:  Are you having pain? Yes: NPRS scale: 4/10 bilateral knee pain at worst; current=0/10 Pain location: Bilateral anterior knee pain Pain description: quick stabbing pain- intermittent Aggravating factors: prolonged walking; playing golf Relieving factors: Rest  PRECAUTIONS: Fall  WEIGHT BEARING RESTRICTIONS: No  FALLS: Has patient fallen in last 6 months? No  LIVING ENVIRONMENT: Lives with: lives with their spouse Lives in: House/apartment Stairs: Yes: Internal: 16 steps; on left going up Has following equipment at home: Single point cane and Walker - 2 wheeled  PLOF: Independent  PATIENT GOALS: Patient reports wanting to improve my balance.  OBJECTIVE:   DIAGNOSTIC FINDINGS: none recent  COGNITION: Overall cognitive status: Within functional limits  for tasks assessed   SENSATION: Patient reports some decreased sensation in left LE and right UE but intact with light touch today.   COORDINATION: Minimal delay- yet able to follow all commands.  Intact heel to shin  EDEMA:  None observed  MUSCLE TONE:  BLE- Within functional limits  DTRs:  Not tested  POSTURE: rounded shoulders and forward head  LOWER EXTREMITY ROM:  Each LE - WNL with all Active ROM - hip/knee/ankle   LOWER EXTREMITY MMT:    MMT Right Eval Left Eval  Hip flexion 4+ 4+  Hip extension 4 4  Hip abduction 4+ 4+  Hip adduction 4+ 4+  Hip internal rotation    Hip external rotation    Knee flexion 5 5  Knee extension 4+* 4+*  Ankle dorsiflexion 4+ 4+  Ankle plantarflexion 4 4  Ankle inversion    Ankle eversion    (Blank rows = not tested)   GAIT: Gait pattern: antalgic Distance walked: 200+ Assistive device utilized: None Level of assistance: SBA Comments: mild unsteadiness with turning - reciprocal steps  FUNCTIONAL TESTS:  5 times sit to stand: 15.2 sec without UE support Timed up and go (TUG): 12.59 sec 10 meter walk test: 0.94 m/s Berg Balance Scale: Deferred to more dynamic balance test Dynamic Gait Index: deferred Function Gait assessment: 20/30   PATIENT SURVEYS:  FOTO 52  TODAY'S TREATMENT:                                                                                                                              DATE: 07/31/2022-      THEREX:   Seated hip march x 15 reps each LE (4# AW) Seated knee ext  x 15 reps each LE (4#AW) Seated hip side step over 1/2 foam roll x 12 reps (4#AW) Step up onto 1st step (4#aw) x 10 reps alt LE's  Leg press (precor)   -70# x 12   -85# x 12  -85# x 12 Calf press (precor)   -85# x 2 sets of 12 reps  Standing lunge squat onto BOSU ball with 1 UE support x 10 reps each LE  NMR:   Static tandem stand on airex pad- hold 30 sec x 3 Staggered stand on airex pad - hold 30 sec x 2  each side Staggered stand on airex pad - horizontal and vertical head   Static stand on BOSU ball (curve side down) - hold 30 sec x 3  Dynamic stand on BOSU ball (curve side down) - horizontal and vertical head motions x 10 reps each    PATIENT EDUCATION: Education details: Purpose of PT; balance education into importance of muscle strength, sensory components of balance Person educated: Patient Education method: Explanation, Demonstration, Tactile cues, and Verbal cues Education comprehension: verbalized understanding, returned demonstration, verbal cues required, tactile cues required, and needs further education  HOME EXERCISE PROGRAM: To be initiated next 1-2 sessions  GOALS: Goals reviewed with patient? Yes  SHORT TERM GOALS: Target date: 08/31/2022  Patient will be independent in home exercise program to improve strength/mobility for better functional independence with ADLs.  Baseline: EVAL- No formal HEP in place Goal status: INITIAL    LONG TERM GOALS: Target date: 10/12/2022  Pt will increase by at least 0.13 m/s in order to demonstrate clinically significant improvement in community ambulation.  Baseline: EVAL= 0.94 m/s Goal status: INITIAL  2.  Patient will increase Functional Gait Assessment score to >23/30 as to reduce fall risk and improve dynamic gait safety with community ambulation.  Baseline: EVAL= 20/30 Goal status: INITIAL  3.  Patient will increase FOTO score to equal to or greater than 54 % to demonstrate statistically significant improvement in mobility and quality of life.  Baseline: EVAL= 52 Goal status: INITIAL  4.  Pt will decrease 5TSTS by at least 3 seconds in order to demonstrate clinically significant improvement in LE strength. Baseline: EVAL= 15.2 sec without UE Support Goal status: INITIAL    ASSESSMENT:  CLINICAL IMPRESSION:  Treatment continued per plan last visit and focused more on some LE strengthening exercises. He  responded well overall- stating no significant increase in knee pain with LE strengthening. He required VC for technique yet able to perform safely and no significant LOB with balance activities. He is still most challenged with tandem standing and will continue to focus on higher level balance exercises. Patient will benefit from PT skilled interventions to improve his functional strength and balance to assist with all community mobility and recreation like golf.   OBJECTIVE IMPAIRMENTS: Abnormal gait, decreased activity tolerance, decreased balance, decreased coordination, decreased endurance, decreased mobility, difficulty walking, decreased strength, impaired flexibility, and pain.   ACTIVITY LIMITATIONS: carrying, lifting, bending, standing, squatting, and stairs  PARTICIPATION LIMITATIONS: cleaning, shopping, community activity, and yard work  PERSONAL FACTORS: Age and 3+ comorbidities: HTN, CKD, Prostate cancer  are also affecting patient's functional outcome.   REHAB POTENTIAL: Good  CLINICAL DECISION MAKING: Stable/uncomplicated  EVALUATION COMPLEXITY: Low  PLAN:  PT FREQUENCY: 1-2x/week  PT DURATION: 12 weeks  PLANNED INTERVENTIONS: Therapeutic exercises, Therapeutic activity, Neuromuscular re-education, Balance training, Gait training, Patient/Family education, Self Care, Joint mobilization, Stair training, Vestibular training, Canalith repositioning, DME instructions, Dry Needling, Spinal manipulation, Spinal mobilization, Cryotherapy, Moist heat, Taping, Ultrasound, and Manual therapy  PLAN FOR NEXT SESSION: continue with higher level balance training and add to HEP as appropriate.    Lenda Kelp, PT 07/31/2022, 11:10 AM

## 2022-08-03 ENCOUNTER — Encounter: Payer: Self-pay | Admitting: Physical Therapy

## 2022-08-03 ENCOUNTER — Ambulatory Visit: Payer: Medicare Other | Admitting: Physical Therapy

## 2022-08-03 DIAGNOSIS — R2689 Other abnormalities of gait and mobility: Secondary | ICD-10-CM | POA: Diagnosis not present

## 2022-08-03 DIAGNOSIS — R2681 Unsteadiness on feet: Secondary | ICD-10-CM

## 2022-08-03 DIAGNOSIS — M6281 Muscle weakness (generalized): Secondary | ICD-10-CM | POA: Diagnosis not present

## 2022-08-03 DIAGNOSIS — R278 Other lack of coordination: Secondary | ICD-10-CM | POA: Diagnosis not present

## 2022-08-03 DIAGNOSIS — I6930 Unspecified sequelae of cerebral infarction: Secondary | ICD-10-CM | POA: Diagnosis not present

## 2022-08-03 NOTE — Therapy (Signed)
OUTPATIENT PHYSICAL THERAPY NEURO TREATMENT   Patient Name: Bobby Murillo [name]: [id number] DOB:08-14-1940, 82 y.o., male Today's Date: 08/03/2022   PCP: Dr. Duncan Dull REFERRING PROVIDER: Dr. Duncan Dull  END OF SESSION:  PT End of Session - 08/03/22 1033     Visit Number 5    Number of Visits 24    Date for PT Re-Evaluation 10/12/22    Progress Note Due on Visit 10    PT Start Time 1100    PT Stop Time 1145    PT Time Calculation (min) 45 min    Equipment Utilized During Treatment Gait belt    Activity Tolerance Patient tolerated treatment well    Behavior During Therapy WFL for tasks assessed/performed              Past Medical History:  Diagnosis Date   Abnormal LFTs    Acute cerebrovascular accident (CVA) due to thrombosis of left middle cerebral artery (HCC) 01/05/2021   Anxiety    Aortic atherosclerosis (HCC)    Arthritis    Basal cell carcinoma 09/14/2010   left calf    Bilateral carotid artery stenosis    a.) Carotid doppler 01/03/2021: 70-99% LICA; 0-49% RICA. b.) CTA neck 01/30/2021: critical stenosis LEFT carotid bulb.   Carotid stenosis, symptomatic, with infarction (HCC) 01/20/2021   CKD (chronic kidney disease), stage III (HCC)    History of kidney stones    Hyperlipidemia    Hypertension    Long term current use of antithrombotics/antiplatelets    a.) DAPT therapy (ASA + clopidogrel)   Prostate cancer (HCC) 03/16/2008   Dr. Baxter Flattery   Sinus bradycardia by electrocardiogram    Stroke (HCC) 01/03/2021   a.) Acute infarction affecting the cortex. ?? incidental punctate acute white matter infarction just posterior to the atrium of the left lateral ventricle. Stenosis of the right M2 branch inferior division. Severe atherosclerotic disease of both PCAs with severe stenoses.   Past Surgical History:  Procedure Laterality Date   CAROTID PTA/STENT INTERVENTION Left 12/30/2021   Procedure: CAROTID PTA/STENT INTERVENTION;  Surgeon: Renford Dills, MD;   Location: [facility];  Service: Cardiovascular;  Laterality: Left;   CATARACT EXTRACTION Bilateral    05/2021   COLONOSCOPY     ENDARTERECTOMY Left 02/19/2021   Procedure: ENDARTERECTOMY CAROTID;  Surgeon: Renford Dills, MD;  Location: [facility];  Service: Vascular;  Laterality: Left;   PROSTATECTOMY  11/14/2008   transurethral, radical, Dr. Carin Primrose   Patient Active Problem List   Diagnosis Date Noted   Left nephrolithiasis 12/21/2021   OSA (obstructive sleep apnea) 11/28/2021   Memory deficit after cerebral infarction 05/12/2021   Proximal muscle weakness 05/12/2021   Carotid stenosis, symptomatic w/o infarct, left 02/19/2021   Hospital discharge follow-up 01/11/2021   History of CVA with residual deficit 01/11/2021   Internal carotid artery stenosis 01/05/2021   Atrial septal defect 01/05/2021   Anxiety    Leukocytosis    Loss of balance 12/23/2020   Arthritis of left shoulder region 10/30/2020   Tendonitis, Achilles, left 10/30/2020   Osteoarthritis of knees, bilateral 10/30/2020   Irregular heart rhythm 05/27/2020   Abdominal aortic atherosclerosis (HCC) 05/06/2020   Myalgia due to statin 05/06/2020   Vertigo 04/24/2020   Allergic rhinitis 03/06/2020   Stage 3a chronic kidney disease (HCC) 08/07/2019   Cognitive complaints with normal neuropsychological exam 02/21/2019   Polyarthralgia 02/08/2018   Erythrocytosis 02/08/2018   Insomnia due to psychological stress 02/02/2017   Encounter for  preventive health examination 02/02/2017   Essential hypertension 06/01/2015   Sinus bradycardia 06/01/2015   Encounter for Medicare annual wellness exam 12/02/2014   Degenerative joint disease of knee 02/15/2011   Hyperlipidemia LDL goal <100 02/15/2011   History of prostate cancer    Basal cell carcinoma     ONSET DATE: About 2 years ago- worse in past 4 months  REFERRING DIAG:  I69.30 (ICD-10-CM) - History of CVA with residual deficit  R26.89 (ICD-10-CM) - Loss of  balance    THERAPY DIAG:  Unsteadiness on feet  Muscle weakness (generalized)  Other abnormalities of gait and mobility  Rationale for Evaluation and Treatment: Rehabilitation  SUBJECTIVE:                                                                                                                                                                                             SUBJECTIVE STATEMENT: Patient reports no significant changes since previous session.  Pt accompanied by: self  PERTINENT HISTORY: Patient is a 82 year male referral from PCP with residual issues with balance due to CVA in past. Patient has a past medical history of carotid stent 10/23,  Abnormal LFTs, Anxiety, Aortic atherosclerosis (HCC), Arthritis, Basal cell carcinoma (09/14/2010), Bilateral carotid artery stenosis, CKD (chronic kidney disease), stage III (HCC), History of kidney stones, Hyperlipidemia, Hypertension, Long term current use of antithrombotics/antiplatelets, Prostate cancer (HCC) (03/16/2008), Sinus bradycardia by electrocardiogram, and Stroke (HCC) (01/03/2021).   PAIN:  Are you having pain? Yes: NPRS scale: 4/10 bilateral knee pain at worst; current=0/10 Pain location: Bilateral anterior knee pain Pain description: quick stabbing pain- intermittent Aggravating factors: prolonged walking; playing golf Relieving factors: Rest  PRECAUTIONS: Fall  WEIGHT BEARING RESTRICTIONS: No  FALLS: Has patient fallen in last 6 months? No  LIVING ENVIRONMENT: Lives with: lives with their spouse Lives in: House/apartment Stairs: Yes: Internal: 16 steps; on left going up Has following equipment at home: Single point cane and Walker - 2 wheeled  PLOF: Independent  PATIENT GOALS: Patient reports wanting to improve my balance.  OBJECTIVE:   DIAGNOSTIC FINDINGS: none recent  COGNITION: Overall cognitive status: Within functional limits for tasks assessed   SENSATION: Patient reports some decreased  sensation in left LE and right UE but intact with light touch today.   COORDINATION: Minimal delay- yet able to follow all commands.  Intact heel to shin  EDEMA:  None observed  MUSCLE TONE:  BLE- Within functional limits  DTRs:  Not tested  POSTURE: rounded shoulders and forward head  LOWER EXTREMITY ROM:     Each LE - WNL with all Active ROM - hip/knee/ankle  LOWER EXTREMITY MMT:    MMT Right Eval Left Eval  Hip flexion 4+ 4+  Hip extension 4 4  Hip abduction 4+ 4+  Hip adduction 4+ 4+  Hip internal rotation    Hip external rotation    Knee flexion 5 5  Knee extension 4+* 4+*  Ankle dorsiflexion 4+ 4+  Ankle plantarflexion 4 4  Ankle inversion    Ankle eversion    (Blank rows = not tested)   GAIT: Gait pattern: antalgic Distance walked: 200+ Assistive device utilized: None Level of assistance: SBA Comments: mild unsteadiness with turning - reciprocal steps  FUNCTIONAL TESTS:  5 times sit to stand: 15.2 sec without UE support Timed up and go (TUG): 12.59 sec 10 meter walk test: 0.94 m/s Berg Balance Scale: Deferred to more dynamic balance test Dynamic Gait Index: deferred Function Gait assessment: 20/30   PATIENT SURVEYS:  FOTO 52  TODAY'S TREATMENT:                                                                                                                              DATE: 07/31/2022-      THEREX:   NuStep interval training level 2 and level 4 x 1 minute intervals each x 3 rounds  Seated hip march 2*10 reps each LE (4# AW) Seated knee ext  2*10 reps each LE (4#AW)  Leg press (precor)   -70# x 15   -85# x 12  -100# x 12    NMR:  Standing on airex beam side steps with 4# AW 2 x 10 ea LE, difficulty with recovering from post LOB on many occasions, rest between sets  Standing on Airex balance pad performing golf swing with external cue of half foam roller for targeted golf swing 2 sets of 10 repetitions  Standing on wobble board  focusing on anterior and posterior weight shift x 1 round with upper extremity support followed by 2 rounds without upper extremity support for approximately 10 rocks in each direction   PATIENT EDUCATION: Education details: Purpose of PT; balance education into importance of muscle strength, sensory components of balance Person educated: Patient Education method: Explanation, Demonstration, Tactile cues, and Verbal cues Education comprehension: verbalized understanding, returned demonstration, verbal cues required, tactile cues required, and needs further education  HOME EXERCISE PROGRAM: To be initiated next 1-2 sessions  GOALS: Goals reviewed with patient? Yes  SHORT TERM GOALS: Target date: 08/31/2022  Patient will be independent in home exercise program to improve strength/mobility for better functional independence with ADLs.  Baseline: EVAL- No formal HEP in place Goal status: INITIAL    LONG TERM GOALS: Target date: 10/12/2022  Pt will increase by at least 0.13 m/s in order to demonstrate clinically significant improvement in community ambulation.   Baseline: EVAL= 0.94 m/s Goal status: INITIAL  2.  Patient will increase Functional Gait Assessment score to >23/30 as to reduce fall risk and improve dynamic gait safety with community  ambulation.  Baseline: EVAL= 20/30 Goal status: INITIAL  3.  Patient will increase FOTO score to equal to or greater than 54 % to demonstrate statistically significant improvement in mobility and quality of life.  Baseline: EVAL= 52 Goal status: INITIAL  4.  Pt will decrease 5TSTS by at least 3 seconds in order to demonstrate clinically significant improvement in LE strength. Baseline: EVAL= 15.2 sec without UE Support Goal status: INITIAL    ASSESSMENT:  CLINICAL IMPRESSION:  Patient reports no significant changes since last session.  Patient continues to show improvement with balance but did show difficulty with anterior to  posterior weight shifting when standing on balance pad requiring dynamic activities.  Physical therapist targeted this with following neuromuscular training interventions. Pt will continue to benefit from skilled physical therapy intervention to address impairments, improve QOL, and attain therapy goals.    OBJECTIVE IMPAIRMENTS: Abnormal gait, decreased activity tolerance, decreased balance, decreased coordination, decreased endurance, decreased mobility, difficulty walking, decreased strength, impaired flexibility, and pain.   ACTIVITY LIMITATIONS: carrying, lifting, bending, standing, squatting, and stairs  PARTICIPATION LIMITATIONS: cleaning, shopping, community activity, and yard work  PERSONAL FACTORS: Age and 3+ comorbidities: HTN, CKD, Prostate cancer  are also affecting patient's functional outcome.   REHAB POTENTIAL: Good  CLINICAL DECISION MAKING: Stable/uncomplicated  EVALUATION COMPLEXITY: Low  PLAN:  PT FREQUENCY: 1-2x/week  PT DURATION: 12 weeks  PLANNED INTERVENTIONS: Therapeutic exercises, Therapeutic activity, Neuromuscular re-education, Balance training, Gait training, Patient/Family education, Self Care, Joint mobilization, Stair training, Vestibular training, Canalith repositioning, DME instructions, Dry Needling, Spinal manipulation, Spinal mobilization, Cryotherapy, Moist heat, Taping, Ultrasound, and Manual therapy  PLAN FOR NEXT SESSION: continue with higher level balance training and add to HEP as appropriate.    Norman Herrlich, PT 08/03/2022, 12:26 PM

## 2022-08-06 ENCOUNTER — Ambulatory Visit: Payer: Medicare Other

## 2022-08-07 ENCOUNTER — Ambulatory Visit: Payer: Medicare Other

## 2022-08-07 DIAGNOSIS — R2681 Unsteadiness on feet: Secondary | ICD-10-CM | POA: Diagnosis not present

## 2022-08-07 DIAGNOSIS — R278 Other lack of coordination: Secondary | ICD-10-CM

## 2022-08-07 DIAGNOSIS — R2689 Other abnormalities of gait and mobility: Secondary | ICD-10-CM | POA: Diagnosis not present

## 2022-08-07 DIAGNOSIS — M6281 Muscle weakness (generalized): Secondary | ICD-10-CM

## 2022-08-07 DIAGNOSIS — I6930 Unspecified sequelae of cerebral infarction: Secondary | ICD-10-CM | POA: Diagnosis not present

## 2022-08-07 NOTE — Therapy (Signed)
OUTPATIENT PHYSICAL THERAPY NEURO TREATMENT   Patient Name: Bobby Murillo [name]: [id number] DOB:November 05, 1940, 82 y.o., male Today's Date: 08/07/2022   PCP: Dr. Duncan Dull REFERRING PROVIDER: Dr. Duncan Dull  END OF SESSION:  PT End of Session - 08/07/22 0928     Visit Number 6    Number of Visits 24    Date for PT Re-Evaluation 10/12/22    Progress Note Due on Visit 10    PT Start Time 0928    PT Stop Time 1015    PT Time Calculation (min) 47 min    Equipment Utilized During Treatment Gait belt    Activity Tolerance Patient tolerated treatment well    Behavior During Therapy WFL for tasks assessed/performed               Past Medical History:  Diagnosis Date   Abnormal LFTs    Acute cerebrovascular accident (CVA) due to thrombosis of left middle cerebral artery (HCC) 01/05/2021   Anxiety    Aortic atherosclerosis (HCC)    Arthritis    Basal cell carcinoma 09/14/2010   left calf    Bilateral carotid artery stenosis    a.) Carotid doppler 01/03/2021: 70-99% LICA; 0-49% RICA. b.) CTA neck 01/30/2021: critical stenosis LEFT carotid bulb.   Carotid stenosis, symptomatic, with infarction (HCC) 01/20/2021   CKD (chronic kidney disease), stage III (HCC)    History of kidney stones    Hyperlipidemia    Hypertension    Long term current use of antithrombotics/antiplatelets    a.) DAPT therapy (ASA + clopidogrel)   Prostate cancer (HCC) 03/16/2008   Dr. Baxter Flattery   Sinus bradycardia by electrocardiogram    Stroke (HCC) 01/03/2021   a.) Acute infarction affecting the cortex. ?? incidental punctate acute white matter infarction just posterior to the atrium of the left lateral ventricle. Stenosis of the right M2 branch inferior division. Severe atherosclerotic disease of both PCAs with severe stenoses.   Past Surgical History:  Procedure Laterality Date   CAROTID PTA/STENT INTERVENTION Left 12/30/2021   Procedure: CAROTID PTA/STENT INTERVENTION;  Surgeon: Renford Dills, MD;   Location: [facility];  Service: Cardiovascular;  Laterality: Left;   CATARACT EXTRACTION Bilateral    05/2021   COLONOSCOPY     ENDARTERECTOMY Left 02/19/2021   Procedure: ENDARTERECTOMY CAROTID;  Surgeon: Renford Dills, MD;  Location: [facility];  Service: Vascular;  Laterality: Left;   PROSTATECTOMY  11/14/2008   transurethral, radical, Dr. Carin Primrose   Patient Active Problem List   Diagnosis Date Noted   Left nephrolithiasis 12/21/2021   OSA (obstructive sleep apnea) 11/28/2021   Memory deficit after cerebral infarction 05/12/2021   Proximal muscle weakness 05/12/2021   Carotid stenosis, symptomatic w/o infarct, left 02/19/2021   Hospital discharge follow-up 01/11/2021   History of CVA with residual deficit 01/11/2021   Internal carotid artery stenosis 01/05/2021   Atrial septal defect 01/05/2021   Anxiety    Leukocytosis    Loss of balance 12/23/2020   Arthritis of left shoulder region 10/30/2020   Tendonitis, Achilles, left 10/30/2020   Osteoarthritis of knees, bilateral 10/30/2020   Irregular heart rhythm 05/27/2020   Abdominal aortic atherosclerosis (HCC) 05/06/2020   Myalgia due to statin 05/06/2020   Vertigo 04/24/2020   Allergic rhinitis 03/06/2020   Stage 3a chronic kidney disease (HCC) 08/07/2019   Cognitive complaints with normal neuropsychological exam 02/21/2019   Polyarthralgia 02/08/2018   Erythrocytosis 02/08/2018   Insomnia due to psychological stress 02/02/2017   Encounter  for preventive health examination 02/02/2017   Essential hypertension 06/01/2015   Sinus bradycardia 06/01/2015   Encounter for Medicare annual wellness exam 12/02/2014   Degenerative joint disease of knee 02/15/2011   Hyperlipidemia LDL goal <100 02/15/2011   History of prostate cancer    Basal cell carcinoma     ONSET DATE: About 2 years ago- worse in past 4 months  REFERRING DIAG:  I69.30 (ICD-10-CM) - History of CVA with residual deficit  R26.89 (ICD-10-CM) - Loss of  balance    THERAPY DIAG:  Unsteadiness on feet  Muscle weakness (generalized)  Other abnormalities of gait and mobility  Other lack of coordination  Rationale for Evaluation and Treatment: Rehabilitation  SUBJECTIVE:                                                                                                                                                                                             SUBJECTIVE STATEMENT: Patient reports doing okay- Not playing good golf secondary to decreased overall balance. Also having some left arch pain today.   Pt accompanied by: self  PERTINENT HISTORY: Patient is a 82 year male referral from PCP with residual issues with balance due to CVA in past. Patient has a past medical history of carotid stent 10/23,  Abnormal LFTs, Anxiety, Aortic atherosclerosis (HCC), Arthritis, Basal cell carcinoma (09/14/2010), Bilateral carotid artery stenosis, CKD (chronic kidney disease), stage III (HCC), History of kidney stones, Hyperlipidemia, Hypertension, Long term current use of antithrombotics/antiplatelets, Prostate cancer (HCC) (03/16/2008), Sinus bradycardia by electrocardiogram, and Stroke (HCC) (01/03/2021).   PAIN:  Are you having pain? Yes: NPRS scale: 4/10 bilateral knee pain at worst; current=0/10 Pain location: Bilateral anterior knee pain Pain description: quick stabbing pain- intermittent Aggravating factors: prolonged walking; playing golf Relieving factors: Rest  PRECAUTIONS: Fall  WEIGHT BEARING RESTRICTIONS: No  FALLS: Has patient fallen in last 6 months? No  LIVING ENVIRONMENT: Lives with: lives with their spouse Lives in: House/apartment Stairs: Yes: Internal: 16 steps; on left going up Has following equipment at home: Single point cane and Walker - 2 wheeled  PLOF: Independent  PATIENT GOALS: Patient reports wanting to improve my balance.  OBJECTIVE:   DIAGNOSTIC FINDINGS: none recent  COGNITION: Overall cognitive  status: Within functional limits for tasks assessed   SENSATION: Patient reports some decreased sensation in left LE and right UE but intact with light touch today.   COORDINATION: Minimal delay- yet able to follow all commands.  Intact heel to shin  EDEMA:  None observed  MUSCLE TONE:  BLE- Within functional limits  DTRs:  Not tested  POSTURE: rounded shoulders and forward  head  LOWER EXTREMITY ROM:     Each LE - WNL with all Active ROM - hip/knee/ankle   LOWER EXTREMITY MMT:    MMT Right Eval Left Eval  Hip flexion 4+ 4+  Hip extension 4 4  Hip abduction 4+ 4+  Hip adduction 4+ 4+  Hip internal rotation    Hip external rotation    Knee flexion 5 5  Knee extension 4+* 4+*  Ankle dorsiflexion 4+ 4+  Ankle plantarflexion 4 4  Ankle inversion    Ankle eversion    (Blank rows = not tested)   GAIT: Gait pattern: antalgic Distance walked: 200+ Assistive device utilized: None Level of assistance: SBA Comments: mild unsteadiness with turning - reciprocal steps  FUNCTIONAL TESTS:  5 times sit to stand: 15.2 sec without UE support Timed up and go (TUG): 12.59 sec 10 meter walk test: 0.94 m/s Berg Balance Scale: Deferred to more dynamic balance test Dynamic Gait Index: deferred Function Gait assessment: 20/30   PATIENT SURVEYS:  FOTO 52  TODAY'S TREATMENT:                                                                                                                              DATE: 08/07/2022-      THEREX:   Standing side stepping with RTB x 10 feet then back x 2 Wide resistive band around ankle walk - forward - maintaining wide stance x 10 feet down and back x2 Wide Stance Golf Swings with TRX x 15 reps each LE TRX squats x 15 reps     NMR:   Standing on Airex balance pad performing step tap onto 2nd step without UE support x 20 reps each LE Single Leg Balance with Opposite Leg Star Reach x several min each LE Standing on rocker board  focusing on anterior and posterior weight shift x 20 reps  Standing Single Leg Balance with Alternating Reaching for cones  x 10 with light UE support  Forward tandem gait x 10 feet + large retro steps - down and back x3    PATIENT EDUCATION: Education details: Purpose of PT; balance education into importance of muscle strength, sensory components of balance Person educated: Patient Education method: Explanation, Demonstration, Tactile cues, and Verbal cues Education comprehension: verbalized understanding, returned demonstration, verbal cues required, tactile cues required, and needs further education  HOME EXERCISE PROGRAM: To be initiated next 1-2 sessions  GOALS: Goals reviewed with patient? Yes  SHORT TERM GOALS: Target date: 08/31/2022  Patient will be independent in home exercise program to improve strength/mobility for better functional independence with ADLs.  Baseline: EVAL- No formal HEP in place Goal status: INITIAL    LONG TERM GOALS: Target date: 10/12/2022  Pt will increase by at least 0.13 m/s in order to demonstrate clinically significant improvement in community ambulation.   Baseline: EVAL= 0.94 m/s Goal status: INITIAL  2.  Patient will increase Functional Gait Assessment score to >23/30  as to reduce fall risk and improve dynamic gait safety with community ambulation.  Baseline: EVAL= 20/30 Goal status: INITIAL  3.  Patient will increase FOTO score to equal to or greater than 54 % to demonstrate statistically significant improvement in mobility and quality of life.  Baseline: EVAL= 52 Goal status: INITIAL  4.  Pt will decrease 5TSTS by at least 3 seconds in order to demonstrate clinically significant improvement in LE strength. Baseline: EVAL= 15.2 sec without UE Support Goal status: INITIAL    ASSESSMENT:  CLINICAL IMPRESSION:  Patient performed well with all therex and balance- Some reported knee soreness but able to complete all activities  well. He was most challenged with tandem and star tapping today and will benefit from higher level balance activities.  Pt will continue to benefit from skilled physical therapy intervention to address impairments, improve QOL, and attain therapy goals.    OBJECTIVE IMPAIRMENTS: Abnormal gait, decreased activity tolerance, decreased balance, decreased coordination, decreased endurance, decreased mobility, difficulty walking, decreased strength, impaired flexibility, and pain.   ACTIVITY LIMITATIONS: carrying, lifting, bending, standing, squatting, and stairs  PARTICIPATION LIMITATIONS: cleaning, shopping, community activity, and yard work  PERSONAL FACTORS: Age and 3+ comorbidities: HTN, CKD, Prostate cancer  are also affecting patient's functional outcome.   REHAB POTENTIAL: Good  CLINICAL DECISION MAKING: Stable/uncomplicated  EVALUATION COMPLEXITY: Low  PLAN:  PT FREQUENCY: 1-2x/week  PT DURATION: 12 weeks  PLANNED INTERVENTIONS: Therapeutic exercises, Therapeutic activity, Neuromuscular re-education, Balance training, Gait training, Patient/Family education, Self Care, Joint mobilization, Stair training, Vestibular training, Canalith repositioning, DME instructions, Dry Needling, Spinal manipulation, Spinal mobilization, Cryotherapy, Moist heat, Taping, Ultrasound, and Manual therapy  PLAN FOR NEXT SESSION: continue with higher level balance training and add to HEP as appropriate.    Lenda Kelp, PT 08/07/2022, 11:19 AM

## 2022-08-11 ENCOUNTER — Ambulatory Visit: Payer: Medicare Other

## 2022-08-13 ENCOUNTER — Ambulatory Visit: Payer: Medicare Other

## 2022-08-14 ENCOUNTER — Ambulatory Visit: Payer: Medicare Other

## 2022-08-14 DIAGNOSIS — I6930 Unspecified sequelae of cerebral infarction: Secondary | ICD-10-CM | POA: Diagnosis not present

## 2022-08-14 DIAGNOSIS — R2689 Other abnormalities of gait and mobility: Secondary | ICD-10-CM | POA: Diagnosis not present

## 2022-08-14 DIAGNOSIS — R278 Other lack of coordination: Secondary | ICD-10-CM | POA: Diagnosis not present

## 2022-08-14 DIAGNOSIS — M6281 Muscle weakness (generalized): Secondary | ICD-10-CM | POA: Diagnosis not present

## 2022-08-14 DIAGNOSIS — R2681 Unsteadiness on feet: Secondary | ICD-10-CM | POA: Diagnosis not present

## 2022-08-14 NOTE — Therapy (Signed)
OUTPATIENT PHYSICAL THERAPY NEURO TREATMENT   Patient Name: Bobby Murillo [name]: [id number] DOB:1940/10/27, 82 y.o., male Today's Date: 08/14/2022   PCP: Dr. Duncan Dull REFERRING PROVIDER: Dr. Duncan Dull  END OF SESSION:  PT End of Session - 08/14/22 0844     Visit Number 7    Number of Visits 24    Date for PT Re-Evaluation 10/12/22    Progress Note Due on Visit 10    PT Start Time 0845    PT Stop Time 0929    PT Time Calculation (min) 44 min    Equipment Utilized During Treatment Gait belt    Activity Tolerance Patient tolerated treatment well    Behavior During Therapy WFL for tasks assessed/performed                Past Medical History:  Diagnosis Date   Abnormal LFTs    Acute cerebrovascular accident (CVA) due to thrombosis of left middle cerebral artery (HCC) 01/05/2021   Anxiety    Aortic atherosclerosis (HCC)    Arthritis    Basal cell carcinoma 09/14/2010   left calf    Bilateral carotid artery stenosis    a.) Carotid doppler 01/03/2021: 70-99% LICA; 0-49% RICA. b.) CTA neck 01/30/2021: critical stenosis LEFT carotid bulb.   Carotid stenosis, symptomatic, with infarction (HCC) 01/20/2021   CKD (chronic kidney disease), stage III (HCC)    History of kidney stones    Hyperlipidemia    Hypertension    Long term current use of antithrombotics/antiplatelets    a.) DAPT therapy (ASA + clopidogrel)   Prostate cancer (HCC) 03/16/2008   Dr. Baxter Flattery   Sinus bradycardia by electrocardiogram    Stroke (HCC) 01/03/2021   a.) Acute infarction affecting the cortex. ?? incidental punctate acute white matter infarction just posterior to the atrium of the left lateral ventricle. Stenosis of the right M2 branch inferior division. Severe atherosclerotic disease of both PCAs with severe stenoses.   Past Surgical History:  Procedure Laterality Date   CAROTID PTA/STENT INTERVENTION Left 12/30/2021   Procedure: CAROTID PTA/STENT INTERVENTION;  Surgeon: Renford Dills,  MD;  Location: [facility];  Service: Cardiovascular;  Laterality: Left;   CATARACT EXTRACTION Bilateral    05/2021   COLONOSCOPY     ENDARTERECTOMY Left 02/19/2021   Procedure: ENDARTERECTOMY CAROTID;  Surgeon: Renford Dills, MD;  Location: [facility];  Service: Vascular;  Laterality: Left;   PROSTATECTOMY  11/14/2008   transurethral, radical, Dr. Carin Primrose   Patient Active Problem List   Diagnosis Date Noted   Left nephrolithiasis 12/21/2021   OSA (obstructive sleep apnea) 11/28/2021   Memory deficit after cerebral infarction 05/12/2021   Proximal muscle weakness 05/12/2021   Carotid stenosis, symptomatic w/o infarct, left 02/19/2021   Hospital discharge follow-up 01/11/2021   History of CVA with residual deficit 01/11/2021   Internal carotid artery stenosis 01/05/2021   Atrial septal defect 01/05/2021   Anxiety    Leukocytosis    Loss of balance 12/23/2020   Arthritis of left shoulder region 10/30/2020   Tendonitis, Achilles, left 10/30/2020   Osteoarthritis of knees, bilateral 10/30/2020   Irregular heart rhythm 05/27/2020   Abdominal aortic atherosclerosis (HCC) 05/06/2020   Myalgia due to statin 05/06/2020   Vertigo 04/24/2020   Allergic rhinitis 03/06/2020   Stage 3a chronic kidney disease (HCC) 08/07/2019   Cognitive complaints with normal neuropsychological exam 02/21/2019   Polyarthralgia 02/08/2018   Erythrocytosis 02/08/2018   Insomnia due to psychological stress 02/02/2017  Encounter for preventive health examination 02/02/2017   Essential hypertension 06/01/2015   Sinus bradycardia 06/01/2015   Encounter for Medicare annual wellness exam 12/02/2014   Degenerative joint disease of knee 02/15/2011   Hyperlipidemia LDL goal <100 02/15/2011   History of prostate cancer    Basal cell carcinoma     ONSET DATE: About 2 years ago- worse in past 4 months  REFERRING DIAG:  I69.30 (ICD-10-CM) - History of CVA with residual deficit  R26.89 (ICD-10-CM) -  Loss of balance    THERAPY DIAG:  Unsteadiness on feet  Muscle weakness (generalized)  Other abnormalities of gait and mobility  Other lack of coordination  Rationale for Evaluation and Treatment: Rehabilitation  SUBJECTIVE:                                                                                                                                                                                             SUBJECTIVE STATEMENT: Patient reports doing well without any new issues. States he was able to play golf and states wasn't his best or worst day. States he feels his balance is improving some.    Pt accompanied by: self  PERTINENT HISTORY: Patient is a 82 year male referral from PCP with residual issues with balance due to CVA in past. Patient has a past medical history of carotid stent 10/23,  Abnormal LFTs, Anxiety, Aortic atherosclerosis (HCC), Arthritis, Basal cell carcinoma (09/14/2010), Bilateral carotid artery stenosis, CKD (chronic kidney disease), stage III (HCC), History of kidney stones, Hyperlipidemia, Hypertension, Long term current use of antithrombotics/antiplatelets, Prostate cancer (HCC) (03/16/2008), Sinus bradycardia by electrocardiogram, and Stroke (HCC) (01/03/2021).   PAIN:  Are you having pain? Yes: NPRS scale: 4/10 bilateral knee pain at worst; current=0/10 Pain location: Bilateral anterior knee pain Pain description: quick stabbing pain- intermittent Aggravating factors: prolonged walking; playing golf Relieving factors: Rest  PRECAUTIONS: Fall  WEIGHT BEARING RESTRICTIONS: No  FALLS: Has patient fallen in last 6 months? No  LIVING ENVIRONMENT: Lives with: lives with their spouse Lives in: House/apartment Stairs: Yes: Internal: 16 steps; on left going up Has following equipment at home: Single point cane and Walker - 2 wheeled  PLOF: Independent  PATIENT GOALS: Patient reports wanting to improve my balance.  OBJECTIVE:   DIAGNOSTIC  FINDINGS: none recent  COGNITION: Overall cognitive status: Within functional limits for tasks assessed   SENSATION: Patient reports some decreased sensation in left LE and right UE but intact with light touch today.   COORDINATION: Minimal delay- yet able to follow all commands.  Intact heel to shin  EDEMA:  None observed  MUSCLE TONE:  BLE- Within  functional limits  DTRs:  Not tested  POSTURE: rounded shoulders and forward head  LOWER EXTREMITY ROM:     Each LE - WNL with all Active ROM - hip/knee/ankle   LOWER EXTREMITY MMT:    MMT Right Eval Left Eval  Hip flexion 4+ 4+  Hip extension 4 4  Hip abduction 4+ 4+  Hip adduction 4+ 4+  Hip internal rotation    Hip external rotation    Knee flexion 5 5  Knee extension 4+* 4+*  Ankle dorsiflexion 4+ 4+  Ankle plantarflexion 4 4  Ankle inversion    Ankle eversion    (Blank rows = not tested)   GAIT: Gait pattern: antalgic Distance walked: 200+ Assistive device utilized: None Level of assistance: SBA Comments: mild unsteadiness with turning - reciprocal steps  FUNCTIONAL TESTS:  5 times sit to stand: 15.2 sec without UE support Timed up and go (TUG): 12.59 sec 10 meter walk test: 0.94 m/s Berg Balance Scale: Deferred to more dynamic balance test Dynamic Gait Index: deferred Function Gait assessment: 20/30   PATIENT SURVEYS:  FOTO 52  TODAY'S TREATMENT:                                                                                                                              DATE: 08/14/2022-      THEREX:  Standing calf raises on 1/2 foam x 15 reps  Sit to stand without UE support x 12     NMR:  High knee march walk in // bars-down and back x3 Butt kick walk in // bars- down and back x 3 Tandem gait in // bars -down and back x 3 Side step on airex balance beam- down and back x 4  Tandem gait in // bars on airex beam x 4 Forward gait in // bars - stepping over obstacles (4 1/2 foam rolls)  - reciprocal steps to facilitate increased step height/length Side step over 1/2 foam x 4 (while performing squat between steps) - down and back x 3 (short rest at each end)   Cone activities- Figure 8 forward/backward x 3       - forward/side/retro step around 6 cones x 3      - Step over 6 cones with right  then left x 3     - Reach/squat down to pick up cone x 4    PATIENT EDUCATION: Education details: Purpose of PT; balance education into importance of muscle strength, sensory components of balance Person educated: Patient Education method: Explanation, Demonstration, Tactile cues, and Verbal cues Education comprehension: verbalized understanding, returned demonstration, verbal cues required, tactile cues required, and needs further education  HOME EXERCISE PROGRAM: To be initiated next 1-2 sessions  GOALS: Goals reviewed with patient? Yes  SHORT TERM GOALS: Target date: 08/31/2022  Patient will be independent in home exercise program to improve strength/mobility for better functional independence with ADLs.  Baseline: EVAL- No formal HEP in place  Goal status: INITIAL    LONG TERM GOALS: Target date: 10/12/2022  Pt will increase by at least 0.13 m/s in order to demonstrate clinically significant improvement in community ambulation.   Baseline: EVAL= 0.94 m/s Goal status: INITIAL  2.  Patient will increase Functional Gait Assessment score to >23/30 as to reduce fall risk and improve dynamic gait safety with community ambulation.  Baseline: EVAL= 20/30 Goal status: INITIAL  3.  Patient will increase FOTO score to equal to or greater than 54 % to demonstrate statistically significant improvement in mobility and quality of life.  Baseline: EVAL= 52 Goal status: INITIAL  4.  Pt will decrease 5TSTS by at least 3 seconds in order to demonstrate clinically significant improvement in LE strength. Baseline: EVAL= 15.2 sec without UE Support Goal status:  INITIAL    ASSESSMENT:  CLINICAL IMPRESSION:  Patient continued today with progressive balance exercises. He was challenged again with more dynamic activities including and single leg standing or tandem activities. He was responsive to all external cues and quickly adapted - improving with each activity- mostly ankle righting reaction observed today.   Pt will continue to benefit from skilled physical therapy intervention to address impairments, improve QOL, and attain therapy goals.    OBJECTIVE IMPAIRMENTS: Abnormal gait, decreased activity tolerance, decreased balance, decreased coordination, decreased endurance, decreased mobility, difficulty walking, decreased strength, impaired flexibility, and pain.   ACTIVITY LIMITATIONS: carrying, lifting, bending, standing, squatting, and stairs  PARTICIPATION LIMITATIONS: cleaning, shopping, community activity, and yard work  PERSONAL FACTORS: Age and 3+ comorbidities: HTN, CKD, Prostate cancer  are also affecting patient's functional outcome.   REHAB POTENTIAL: Good  CLINICAL DECISION MAKING: Stable/uncomplicated  EVALUATION COMPLEXITY: Low  PLAN:  PT FREQUENCY: 1-2x/week  PT DURATION: 12 weeks  PLANNED INTERVENTIONS: Therapeutic exercises, Therapeutic activity, Neuromuscular re-education, Balance training, Gait training, Patient/Family education, Self Care, Joint mobilization, Stair training, Vestibular training, Canalith repositioning, DME instructions, Dry Needling, Spinal manipulation, Spinal mobilization, Cryotherapy, Moist heat, Taping, Ultrasound, and Manual therapy  PLAN FOR NEXT SESSION: continue with higher level balance training and add to HEP as appropriate.    Lenda Kelp, PT 08/14/2022, 11:18 AM

## 2022-08-17 ENCOUNTER — Ambulatory Visit: Payer: Medicare Other | Attending: Internal Medicine

## 2022-08-17 DIAGNOSIS — R2681 Unsteadiness on feet: Secondary | ICD-10-CM | POA: Diagnosis not present

## 2022-08-17 DIAGNOSIS — R278 Other lack of coordination: Secondary | ICD-10-CM | POA: Diagnosis not present

## 2022-08-17 DIAGNOSIS — M6281 Muscle weakness (generalized): Secondary | ICD-10-CM | POA: Diagnosis not present

## 2022-08-17 DIAGNOSIS — R2689 Other abnormalities of gait and mobility: Secondary | ICD-10-CM | POA: Diagnosis not present

## 2022-08-17 NOTE — Therapy (Signed)
OUTPATIENT PHYSICAL THERAPY NEURO TREATMENT   Patient Name: Bobby Murillo [name]: [id number] DOB:18-Jun-1940, 82 y.o., male Today's Date: 08/19/2022   PCP: Dr. Duncan Dull REFERRING PROVIDER: Dr. Duncan Dull  END OF SESSION:       Past Medical History:  Diagnosis Date   Abnormal LFTs    Acute cerebrovascular accident (CVA) due to thrombosis of left middle cerebral artery (HCC) 01/05/2021   Anxiety    Aortic atherosclerosis (HCC)    Arthritis    Basal cell carcinoma 09/14/2010   left calf    Bilateral carotid artery stenosis    a.) Carotid doppler 01/03/2021: 70-99% LICA; 0-49% RICA. b.) CTA neck 01/30/2021: critical stenosis LEFT carotid bulb.   Carotid stenosis, symptomatic, with infarction (HCC) 01/20/2021   CKD (chronic kidney disease), stage III (HCC)    History of kidney stones    Hyperlipidemia    Hypertension    Long term current use of antithrombotics/antiplatelets    a.) DAPT therapy (ASA + clopidogrel)   Prostate cancer (HCC) 03/16/2008   Dr. Baxter Flattery   Sinus bradycardia by electrocardiogram    Stroke (HCC) 01/03/2021   a.) Acute infarction affecting the cortex. ?? incidental punctate acute white matter infarction just posterior to the atrium of the left lateral ventricle. Stenosis of the right M2 branch inferior division. Severe atherosclerotic disease of both PCAs with severe stenoses.   Past Surgical History:  Procedure Laterality Date   CAROTID PTA/STENT INTERVENTION Left 12/30/2021   Procedure: CAROTID PTA/STENT INTERVENTION;  Surgeon: Renford Dills, MD;  Location: [facility];  Service: Cardiovascular;  Laterality: Left;   CATARACT EXTRACTION Bilateral    05/2021   COLONOSCOPY     ENDARTERECTOMY Left 02/19/2021   Procedure: ENDARTERECTOMY CAROTID;  Surgeon: Renford Dills, MD;  Location: [facility];  Service: Vascular;  Laterality: Left;   PROSTATECTOMY  11/14/2008   transurethral, radical, Dr. Carin Primrose   Patient Active Problem List    Diagnosis Date Noted   Left nephrolithiasis 12/21/2021   OSA (obstructive sleep apnea) 11/28/2021   Memory deficit after cerebral infarction 05/12/2021   Proximal muscle weakness 05/12/2021   Carotid stenosis, symptomatic w/o infarct, left 02/19/2021   Hospital discharge follow-up 01/11/2021   History of CVA with residual deficit 01/11/2021   Internal carotid artery stenosis 01/05/2021   Atrial septal defect 01/05/2021   Anxiety    Leukocytosis    Loss of balance 12/23/2020   Arthritis of left shoulder region 10/30/2020   Tendonitis, Achilles, left 10/30/2020   Osteoarthritis of knees, bilateral 10/30/2020   Irregular heart rhythm 05/27/2020   Abdominal aortic atherosclerosis (HCC) 05/06/2020   Myalgia due to statin 05/06/2020   Vertigo 04/24/2020   Allergic rhinitis 03/06/2020   Stage 3a chronic kidney disease (HCC) 08/07/2019   Cognitive complaints with normal neuropsychological exam 02/21/2019   Polyarthralgia 02/08/2018   Erythrocytosis 02/08/2018   Insomnia due to psychological stress 02/02/2017   Encounter for preventive health examination 02/02/2017   Essential hypertension 06/01/2015   Sinus bradycardia 06/01/2015   Encounter for Medicare annual wellness exam 12/02/2014   Degenerative joint disease of knee 02/15/2011   Hyperlipidemia LDL goal <100 02/15/2011   History of prostate cancer    Basal cell carcinoma     ONSET DATE: About 2 years ago- worse in past 4 months  REFERRING DIAG:  I69.30 (ICD-10-CM) - History of CVA with residual deficit  R26.89 (ICD-10-CM) - Loss of balance    THERAPY DIAG:  Unsteadiness on feet  Muscle weakness (  generalized)  Other abnormalities of gait and mobility  Other lack of coordination  Rationale for Evaluation and Treatment: Rehabilitation  SUBJECTIVE:                                                                                                                                                                                              SUBJECTIVE STATEMENT: Patient reports having a good weekend.  No falls or issues.     Pt accompanied by: self  PERTINENT HISTORY: Patient is a 82 year male referral from PCP with residual issues with balance due to CVA in past. Patient has a past medical history of carotid stent 10/23,  Abnormal LFTs, Anxiety, Aortic atherosclerosis (HCC), Arthritis, Basal cell carcinoma (09/14/2010), Bilateral carotid artery stenosis, CKD (chronic kidney disease), stage III (HCC), History of kidney stones, Hyperlipidemia, Hypertension, Long term current use of antithrombotics/antiplatelets, Prostate cancer (HCC) (03/16/2008), Sinus bradycardia by electrocardiogram, and Stroke (HCC) (01/03/2021).   PAIN:  Are you having pain? Yes: NPRS scale: 4/10 bilateral knee pain at worst; current=0/10 Pain location: Bilateral anterior knee pain Pain description: quick stabbing pain- intermittent Aggravating factors: prolonged walking; playing golf Relieving factors: Rest  PRECAUTIONS: Fall  WEIGHT BEARING RESTRICTIONS: No  FALLS: Has patient fallen in last 6 months? No  LIVING ENVIRONMENT: Lives with: lives with their spouse Lives in: House/apartment Stairs: Yes: Internal: 16 steps; on left going up Has following equipment at home: Single point cane and Walker - 2 wheeled  PLOF: Independent  PATIENT GOALS: Patient reports wanting to improve my balance.  OBJECTIVE:   DIAGNOSTIC FINDINGS: none recent  COGNITION: Overall cognitive status: Within functional limits for tasks assessed   SENSATION: Patient reports some decreased sensation in left LE and right UE but intact with light touch today.   COORDINATION: Minimal delay- yet able to follow all commands.  Intact heel to shin  EDEMA:  None observed  MUSCLE TONE:  BLE- Within functional limits  DTRs:  Not tested  POSTURE: rounded shoulders and forward head  LOWER EXTREMITY ROM:     Each LE - WNL with all Active ROM -  hip/knee/ankle   LOWER EXTREMITY MMT:    MMT Right Eval Left Eval  Hip flexion 4+ 4+  Hip extension 4 4  Hip abduction 4+ 4+  Hip adduction 4+ 4+  Hip internal rotation    Hip external rotation    Knee flexion 5 5  Knee extension 4+* 4+*  Ankle dorsiflexion 4+ 4+  Ankle plantarflexion 4 4  Ankle inversion    Ankle eversion    (Blank rows = not tested)   GAIT: Gait pattern:  antalgic Distance walked: 200+ Assistive device utilized: None Level of assistance: SBA Comments: mild unsteadiness with turning - reciprocal steps  FUNCTIONAL TESTS:  5 times sit to stand: 15.2 sec without UE support Timed up and go (TUG): 12.59 sec 10 meter walk test: 0.94 m/s Berg Balance Scale: Deferred to more dynamic balance test Dynamic Gait Index: deferred Function Gait assessment: 20/30   PATIENT SURVEYS:  FOTO 52  TODAY'S TREATMENT:                                                                                                                              DATE: 08/17/2022-      THEREX:  Standing calf raises on 1/2 foam 2 sets of 12  reps  Sit to stand without UE support x 10 reps x 2 sets (no reported knee pain)  Reverse lunges at support bar with 1UE support x 12 reps     NMR:   Static stand on 1/2 foam (curve side up) -hold 30 sec x 3 (mild unsteadiness- mostly ankle righting reaction)  Static stand on 1/2 foam (curve side down) -hold 30 sec x 3 (increased unsteadiness with more hip righting reaction and intermittent UE support) Forward/backward/diagonal stepping on Verbal instruction from PT- dynamic step up/over 1/2 foam x 2 in cross pattern. Dynamic single leg stance with Hip circles (opp LE) around yoga block - 2 sets of 5 reps each LE with minimal/intermittent UE Support Side stepping left to right and right to left with RTB (5 steps each direction) x 6 at support bar yet no UE support.  Monster walk (RTB) - forward/backward x 8 feet x 5 reps. (Mild difficulty maintaining  tension in band with stepping)       PATIENT EDUCATION: Education details: Exercise technique and balance education into importance of muscle strength, sensory components of balance Person educated: Patient Education method: Explanation, Demonstration, Tactile cues, and Verbal cues Education comprehension: verbalized understanding, returned demonstration, verbal cues required, tactile cues required, and needs further education  HOME EXERCISE PROGRAM: To be initiated next 1-2 sessions  GOALS: Goals reviewed with patient? Yes  SHORT TERM GOALS: Target date: 08/31/2022  Patient will be independent in home exercise program to improve strength/mobility for better functional independence with ADLs.  Baseline: EVAL- No formal HEP in place Goal status: INITIAL    LONG TERM GOALS: Target date: 10/12/2022  Pt will increase by at least 0.13 m/s in order to demonstrate clinically significant improvement in community ambulation.   Baseline: EVAL= 0.94 m/s Goal status: INITIAL  2.  Patient will increase Functional Gait Assessment score to >23/30 as to reduce fall risk and improve dynamic gait safety with community ambulation.  Baseline: EVAL= 20/30 Goal status: INITIAL  3.  Patient will increase FOTO score to equal to or greater than 54 % to demonstrate statistically significant improvement in mobility and quality of life.  Baseline: EVAL= 52 Goal status: INITIAL  4.  Pt will  decrease 5TSTS by at least 3 seconds in order to demonstrate clinically significant improvement in LE strength. Baseline: EVAL= 15.2 sec without UE Support Goal status: INITIAL    ASSESSMENT:  CLINICAL IMPRESSION: Patient presents with good motivation for today treatment focusing on dynamic balance and balance training. Mild difficulty with retro steps but did improve with practice. No report of left knee pain today. He continues to progress well overall with higher level balance activities with improved  SLS and continues to improve with ankle righting reactions specifically during activities on 1/2 foam roll today. Pt will continue to benefit from skilled physical therapy intervention to address impairments, improve QOL, and attain therapy goals.    OBJECTIVE IMPAIRMENTS: Abnormal gait, decreased activity tolerance, decreased balance, decreased coordination, decreased endurance, decreased mobility, difficulty walking, decreased strength, impaired flexibility, and pain.   ACTIVITY LIMITATIONS: carrying, lifting, bending, standing, squatting, and stairs  PARTICIPATION LIMITATIONS: cleaning, shopping, community activity, and yard work  PERSONAL FACTORS: Age and 3+ comorbidities: HTN, CKD, Prostate cancer  are also affecting patient's functional outcome.   REHAB POTENTIAL: Good  CLINICAL DECISION MAKING: Stable/uncomplicated  EVALUATION COMPLEXITY: Low  PLAN:  PT FREQUENCY: 1-2x/week  PT DURATION: 12 weeks  PLANNED INTERVENTIONS: Therapeutic exercises, Therapeutic activity, Neuromuscular re-education, Balance training, Gait training, Patient/Family education, Self Care, Joint mobilization, Stair training, Vestibular training, Canalith repositioning, DME instructions, Dry Needling, Spinal manipulation, Spinal mobilization, Cryotherapy, Moist heat, Taping, Ultrasound, and Manual therapy  PLAN FOR NEXT SESSION: continue with higher level balance training and add to HEP as appropriate (print handout for SLS and tandem next visit)     Lenda Kelp, PT 08/19/2022, 3:47 PM

## 2022-08-19 ENCOUNTER — Other Ambulatory Visit (INDEPENDENT_AMBULATORY_CARE_PROVIDER_SITE_OTHER): Payer: Self-pay | Admitting: Vascular Surgery

## 2022-08-21 ENCOUNTER — Ambulatory Visit: Payer: Medicare Other

## 2022-08-21 DIAGNOSIS — M6281 Muscle weakness (generalized): Secondary | ICD-10-CM

## 2022-08-21 DIAGNOSIS — R2689 Other abnormalities of gait and mobility: Secondary | ICD-10-CM | POA: Diagnosis not present

## 2022-08-21 DIAGNOSIS — R2681 Unsteadiness on feet: Secondary | ICD-10-CM | POA: Diagnosis not present

## 2022-08-21 DIAGNOSIS — R278 Other lack of coordination: Secondary | ICD-10-CM

## 2022-08-21 NOTE — Therapy (Signed)
OUTPATIENT PHYSICAL THERAPY NEURO TREATMENT   Patient Name: Bobby Murillo [name]: [id number] DOB:December 19, 1940, 82 y.o., male Today's Date: 08/21/2022   PCP: Dr. Duncan Dull REFERRING PROVIDER: Dr. Duncan Dull  END OF SESSION:  PT End of Session - 08/21/22 0818     Visit Number 9    Number of Visits 24    Date for PT Re-Evaluation 10/12/22    Authorization Type [employer] Medicare    Authorization Time Period 07/20/22-10/12/22    Progress Note Due on Visit 10    PT Start Time 0810    PT Stop Time 0840    PT Time Calculation (min) 30 min    Equipment Utilized During Treatment Gait belt    Activity Tolerance Patient tolerated treatment well;No increased pain    Behavior During Therapy WFL for tasks assessed/performed                 Past Medical History:  Diagnosis Date   Abnormal LFTs    Acute cerebrovascular accident (CVA) due to thrombosis of left middle cerebral artery (HCC) 01/05/2021   Anxiety    Aortic atherosclerosis (HCC)    Arthritis    Basal cell carcinoma 09/14/2010   left calf    Bilateral carotid artery stenosis    a.) Carotid doppler 01/03/2021: 70-99% LICA; 0-49% RICA. b.) CTA neck 01/30/2021: critical stenosis LEFT carotid bulb.   Carotid stenosis, symptomatic, with infarction (HCC) 01/20/2021   CKD (chronic kidney disease), stage III (HCC)    History of kidney stones    Hyperlipidemia    Hypertension    Long term current use of antithrombotics/antiplatelets    a.) DAPT therapy (ASA + clopidogrel)   Prostate cancer (HCC) 03/16/2008   Dr. Baxter Flattery   Sinus bradycardia by electrocardiogram    Stroke (HCC) 01/03/2021   a.) Acute infarction affecting the cortex. ?? incidental punctate acute white matter infarction just posterior to the atrium of the left lateral ventricle. Stenosis of the right M2 branch inferior division. Severe atherosclerotic disease of both PCAs with severe stenoses.   Past Surgical History:  Procedure Laterality Date   CAROTID PTA/STENT  INTERVENTION Left 12/30/2021   Procedure: CAROTID PTA/STENT INTERVENTION;  Surgeon: Renford Dills, MD;  Location: [facility];  Service: Cardiovascular;  Laterality: Left;   CATARACT EXTRACTION Bilateral    05/2021   COLONOSCOPY     ENDARTERECTOMY Left 02/19/2021   Procedure: ENDARTERECTOMY CAROTID;  Surgeon: Renford Dills, MD;  Location: [facility];  Service: Vascular;  Laterality: Left;   PROSTATECTOMY  11/14/2008   transurethral, radical, Dr. Carin Primrose   Patient Active Problem List   Diagnosis Date Noted   Left nephrolithiasis 12/21/2021   OSA (obstructive sleep apnea) 11/28/2021   Memory deficit after cerebral infarction 05/12/2021   Proximal muscle weakness 05/12/2021   Carotid stenosis, symptomatic w/o infarct, left 02/19/2021   Hospital discharge follow-up 01/11/2021   History of CVA with residual deficit 01/11/2021   Internal carotid artery stenosis 01/05/2021   Atrial septal defect 01/05/2021   Anxiety    Leukocytosis    Loss of balance 12/23/2020   Arthritis of left shoulder region 10/30/2020   Tendonitis, Achilles, left 10/30/2020   Osteoarthritis of knees, bilateral 10/30/2020   Irregular heart rhythm 05/27/2020   Abdominal aortic atherosclerosis (HCC) 05/06/2020   Myalgia due to statin 05/06/2020   Vertigo 04/24/2020   Allergic rhinitis 03/06/2020   Stage 3a chronic kidney disease (HCC) 08/07/2019   Cognitive complaints with normal neuropsychological exam 02/21/2019  Polyarthralgia 02/08/2018   Erythrocytosis 02/08/2018   Insomnia due to psychological stress 02/02/2017   Encounter for preventive health examination 02/02/2017   Essential hypertension 06/01/2015   Sinus bradycardia 06/01/2015   Encounter for Medicare annual wellness exam 12/02/2014   Degenerative joint disease of knee 02/15/2011   Hyperlipidemia LDL goal <100 02/15/2011   History of prostate cancer    Basal cell carcinoma     ONSET DATE: About 2 years ago- worse in past 4  months  REFERRING DIAG:  I69.30 (ICD-10-CM) - History of CVA with residual deficit  R26.89 (ICD-10-CM) - Loss of balance    THERAPY DIAG:  Unsteadiness on feet  Muscle weakness (generalized)  Other abnormalities of gait and mobility  Other lack of coordination  Rationale for Evaluation and Treatment: Rehabilitation  SUBJECTIVE:                                                                                                                                                                                             SUBJECTIVE STATEMENT: Pt doing fine, no updates. Played golf recently, with some pain in knees toward latter half of game.  Pt accompanied by: self  PERTINENT HISTORY: Patient is a 82 year male referral from PCP with residual issues with balance due to CVA in past. Patient has a past medical history of carotid stent 10/23,  Abnormal LFTs, Anxiety, Aortic atherosclerosis (HCC), Arthritis, Basal cell carcinoma (09/14/2010), Bilateral carotid artery stenosis, CKD (chronic kidney disease), stage III (HCC), History of kidney stones, Hyperlipidemia, Hypertension, Long term current use of antithrombotics/antiplatelets, Prostate cancer (HCC) (03/16/2008), Sinus bradycardia by electrocardiogram, and Stroke (HCC) (01/03/2021).   PAIN:  Are you having pain? Yes: NPRS scale: 4/10 bilateral knee pain at worst; current=0/10 Pain location: Bilateral anterior knee pain Pain description: quick stabbing pain- intermittent Aggravating factors: prolonged walking; playing golf Relieving factors: Rest  PRECAUTIONS: Fall  WEIGHT BEARING RESTRICTIONS: No  FALLS: Has patient fallen in last 6 months? No  LIVING ENVIRONMENT: Lives with: lives with their spouse Lives in: House/apartment Stairs: Yes: Internal: 16 steps; on left going up Has following equipment at home: Single point cane and Walker - 2 wheeled  PLOF: Independent  PATIENT GOALS: Patient reports wanting to improve my  balance.  OBJECTIVE:   DIAGNOSTIC FINDINGS: none recent  COGNITION: Overall cognitive status: Within functional limits for tasks assessed   SENSATION: Patient reports some decreased sensation in left LE and right UE but intact with light touch today.   COORDINATION: Minimal delay- yet able to follow all commands.  Intact heel to shin  EDEMA:  None observed  MUSCLE TONE:  BLE-  Within functional limits  DTRs:  Not tested  POSTURE: rounded shoulders and forward head  LOWER EXTREMITY ROM:     Each LE - WNL with all Active ROM - hip/knee/ankle   LOWER EXTREMITY MMT:    MMT Right Eval Left Eval  Hip flexion 4+ 4+  Hip extension 4 4  Hip abduction 4+ 4+  Hip adduction 4+ 4+  Hip internal rotation    Hip external rotation    Knee flexion 5 5  Knee extension 4+* 4+*  Ankle dorsiflexion 4+ 4+  Ankle plantarflexion 4 4  Ankle inversion    Ankle eversion    (Blank rows = not tested)   GAIT: Gait pattern: antalgic Distance walked: 200+ Assistive device utilized: None Level of assistance: SBA Comments: mild unsteadiness with turning - reciprocal steps  FUNCTIONAL TESTS:  5 times sit to stand: 15.2 sec without UE support Timed up and go (TUG): 12.59 sec 10 meter walk test: 0.94 m/s Berg Balance Scale: Deferred to more dynamic balance test Dynamic Gait Index: deferred Function Gait assessment: 20/30   PATIENT SURVEYS:  FOTO 52  TODAY'S TREATMENT:                                                                                                                              DATE: 08/21/22 -STS from chair + airex x10 hands free (knees ok)  -STS from chair only x10 hands free (knee briefly painful on rep 8)  -overground gait analysis in the context of chronic DJD knee pain *pt has loss of TKE in gait cycle, visible high velocity to end range genu varus in midstance bilat, worse on left side.   -AMB to well zone for high ceilings -overhand tennis serve simulation  x30 *foam nerf balls left, using Right hand to hit balls *only a few LOB, no need for assist, although minguardA provided for patient to promote confident movement  In rehab hallway -golf stance->trunk rotation and lateral wall rebound rainbow ball c trunk rotation alternating sides x20 -overhead wall rebounding close to promote cervical and thoracic extension x10   -education on practicing similar activities in a safe context at home *pt will experiment: we disuss the importance of combining postural extension, head movements, and eye tracking   PATIENT EDUCATION: Education details: Exercise technique and balance education into importance of muscle strength, sensory components of balance Person educated: Patient Education method: Explanation, Demonstration, Tactile cues, and Verbal cues Education comprehension: verbalized understanding, returned demonstration, verbal cues required, tactile cues required, and needs further education  HOME EXERCISE PROGRAM: To be initiated next 1-2 sessions  GOALS: Goals reviewed with patient? Yes  SHORT TERM GOALS: Target date: 08/31/2022  Patient will be independent in home exercise program to improve strength/mobility for better functional independence with ADLs.  Baseline: EVAL- No formal HEP in place Goal status: INITIAL    LONG TERM GOALS: Target date: 10/12/2022  Pt will increase by at least 0.13 m/s  in order to demonstrate clinically significant improvement in community ambulation.   Baseline: EVAL= 0.94 m/s Goal status: INITIAL  2.  Patient will increase Functional Gait Assessment score to >23/30 as to reduce fall risk and improve dynamic gait safety with community ambulation.  Baseline: EVAL= 20/30 Goal status: INITIAL  3.  Patient will increase FOTO score to equal to or greater than 54 % to demonstrate statistically significant improvement in mobility and quality of life.  Baseline: EVAL= 52 Goal status: INITIAL  4.  Pt will  decrease 5TSTS by at least 3 seconds in order to demonstrate clinically significant improvement in LE strength. Baseline: EVAL= 15.2 sec without UE Support Goal status: INITIAL    ASSESSMENT:  CLINICAL IMPRESSION: Able to explore some sport-level activities that pt reports as limited since his CVA. We talk about the importance of recovering his higher level balance for the benefit of his general mobility and quality of life. Pt very enthusiastic, excited about experimenting with these at home. Pt does surprisingly well with tennis serve activity, much less problem with balance than he anticipated. Pt will continue to benefit from skilled physical therapy intervention to address impairments, improve QOL, and attain therapy goals.    OBJECTIVE IMPAIRMENTS: Abnormal gait, decreased activity tolerance, decreased balance, decreased coordination, decreased endurance, decreased mobility, difficulty walking, decreased strength, impaired flexibility, and pain.   ACTIVITY LIMITATIONS: carrying, lifting, bending, standing, squatting, and stairs  PARTICIPATION LIMITATIONS: cleaning, shopping, community activity, and yard work  PERSONAL FACTORS: Age and 3+ comorbidities: HTN, CKD, Prostate cancer  are also affecting patient's functional outcome.   REHAB POTENTIAL: Good  CLINICAL DECISION MAKING: Stable/uncomplicated  EVALUATION COMPLEXITY: Low  PLAN:  PT FREQUENCY: 1-2x/week  PT DURATION: 12 weeks  PLANNED INTERVENTIONS: Therapeutic exercises, Therapeutic activity, Neuromuscular re-education, Balance training, Gait training, Patient/Family education, Self Care, Joint mobilization, Stair training, Vestibular training, Canalith repositioning, DME instructions, Dry Needling, Spinal manipulation, Spinal mobilization, Cryotherapy, Moist heat, Taping, Ultrasound, and Manual therapy  PLAN FOR NEXT SESSION: FU on self-exploration for tennis based balance activity at home; continue with higher level  balance training and add to HEP as appropriate (print handout for SLS and tandem next visit)     Deette Revak C, PT 08/21/2022, 12:10 PM  12:11 PM, 08/21/22 Rosamaria Lints, PT, DPT Physical Therapist - [facility]  [phone] [facility])

## 2022-08-24 ENCOUNTER — Ambulatory Visit: Payer: Medicare Other

## 2022-08-24 DIAGNOSIS — R2681 Unsteadiness on feet: Secondary | ICD-10-CM | POA: Diagnosis not present

## 2022-08-24 DIAGNOSIS — R278 Other lack of coordination: Secondary | ICD-10-CM

## 2022-08-24 DIAGNOSIS — R2689 Other abnormalities of gait and mobility: Secondary | ICD-10-CM | POA: Diagnosis not present

## 2022-08-24 DIAGNOSIS — M6281 Muscle weakness (generalized): Secondary | ICD-10-CM | POA: Diagnosis not present

## 2022-08-24 NOTE — Therapy (Signed)
OUTPATIENT PHYSICAL THERAPY NEURO TREATMENT/DISCHARGE SUMMARY/Physical Therapy Progress Note   Dates of reporting period  07/20/2022   to   08/24/2022   Patient Name: Bobby Murillo [name]: [id number] DOB:[birth year], 82 y.o., male Today's Date: 08/24/2022   PCP: Dr. Duncan Dull REFERRING PROVIDER: Dr. Duncan Dull  END OF SESSION:  PT End of Session - 08/24/22 0843     Visit Number 10    Number of Visits 24    Date for PT Re-Evaluation 10/12/22    Authorization Type [employer] Medicare    Authorization Time Period 07/20/22-10/12/22    Progress Note Due on Visit 10    PT Start Time 0843    PT Stop Time 0921    PT Time Calculation (min) 38 min    Equipment Utilized During Treatment Gait belt    Activity Tolerance Patient tolerated treatment well;No increased pain    Behavior During Therapy WFL for tasks assessed/performed                  Past Medical History:  Diagnosis Date   Abnormal LFTs    Acute cerebrovascular accident (CVA) due to thrombosis of left middle cerebral artery (HCC) 01/05/2021   Anxiety    Aortic atherosclerosis (HCC)    Arthritis    Basal cell carcinoma 09/14/2010   left calf    Bilateral carotid artery stenosis    a.) Carotid doppler 01/03/2021: 70-99% LICA; 0-49% RICA. b.) CTA neck 01/30/2021: critical stenosis LEFT carotid bulb.   Carotid stenosis, symptomatic, with infarction (HCC) 01/20/2021   CKD (chronic kidney disease), stage III (HCC)    History of kidney stones    Hyperlipidemia    Hypertension    Long term current use of antithrombotics/antiplatelets    a.) DAPT therapy (ASA + clopidogrel)   Prostate cancer (HCC) 03/16/2008   Dr. Baxter Flattery   Sinus bradycardia by electrocardiogram    Stroke (HCC) 01/03/2021   a.) Acute infarction affecting the cortex. ?? incidental punctate acute white matter infarction just posterior to the atrium of the left lateral ventricle. Stenosis of the right M2 branch inferior division. Severe atherosclerotic disease of  both PCAs with severe stenoses.   Past Surgical History:  Procedure Laterality Date   CAROTID PTA/STENT INTERVENTION Left 12/30/2021   Procedure: CAROTID PTA/STENT INTERVENTION;  Surgeon: Renford Dills, MD;  Location: [facility];  Service: Cardiovascular;  Laterality: Left;   CATARACT EXTRACTION Bilateral    05/2021   COLONOSCOPY     ENDARTERECTOMY Left 02/19/2021   Procedure: ENDARTERECTOMY CAROTID;  Surgeon: Renford Dills, MD;  Location: [facility] ORS;  Service: Vascular;  Laterality: Left;   PROSTATECTOMY  11/14/2008   transurethral, radical, Dr. Carin Primrose   Patient Active Problem List   Diagnosis Date Noted   Left nephrolithiasis 12/21/2021   OSA (obstructive sleep apnea) 11/28/2021   Memory deficit after cerebral infarction 05/12/2021   Proximal muscle weakness 05/12/2021   Carotid stenosis, symptomatic w/o infarct, left 02/19/2021   Hospital discharge follow-up 01/11/2021   History of CVA with residual deficit 01/11/2021   Internal carotid artery stenosis 01/05/2021   Atrial septal defect 01/05/2021   Anxiety    Leukocytosis    Loss of balance 12/23/2020   Arthritis of left shoulder region 10/30/2020   Tendonitis, Achilles, left 10/30/2020   Osteoarthritis of knees, bilateral 10/30/2020   Irregular heart rhythm 05/27/2020   Abdominal aortic atherosclerosis (HCC) 05/06/2020   Myalgia due to statin 05/06/2020   Vertigo 04/24/2020   Allergic rhinitis 03/06/2020  Stage 3a chronic kidney disease (HCC) 08/07/2019   Cognitive complaints with normal neuropsychological exam 02/21/2019   Polyarthralgia 02/08/2018   Erythrocytosis 02/08/2018   Insomnia due to psychological stress 02/02/2017   Encounter for preventive health examination 02/02/2017   Essential hypertension 06/01/2015   Sinus bradycardia 06/01/2015   Encounter for Medicare annual wellness exam 12/02/2014   Degenerative joint disease of knee 02/15/2011   Hyperlipidemia LDL goal <100 02/15/2011    History of prostate cancer    Basal cell carcinoma     ONSET DATE: About 2 years ago- worse in past 4 months  REFERRING DIAG:  I69.30 (ICD-10-CM) - History of CVA with residual deficit  R26.89 (ICD-10-CM) - Loss of balance    THERAPY DIAG:  Unsteadiness on feet  Muscle weakness (generalized)  Other abnormalities of gait and mobility  Other lack of coordination  Rationale for Evaluation and Treatment: Rehabilitation  SUBJECTIVE:                                                                                                                                                                                             SUBJECTIVE STATEMENT: Patient reports feeling about the same- no falls and had a pretty good weekend. States he feels confident and comfortable discharging today and will continue to practice his balance activities and HEP from previous episode of care.    Pt accompanied by: self  PERTINENT HISTORY: Patient is a 82 year male referral from PCP with residual issues with balance due to CVA in past. Patient has a past medical history of carotid stent 10/23,  Abnormal LFTs, Anxiety, Aortic atherosclerosis (HCC), Arthritis, Basal cell carcinoma (09/14/2010), Bilateral carotid artery stenosis, CKD (chronic kidney disease), stage III (HCC), History of kidney stones, Hyperlipidemia, Hypertension, Long term current use of antithrombotics/antiplatelets, Prostate cancer (HCC) (03/16/2008), Sinus bradycardia by electrocardiogram, and Stroke (HCC) (01/03/2021).   PAIN:  Are you having pain? Yes: NPRS scale: 4/10 bilateral knee pain at worst; current=0/10 Pain location: Bilateral anterior knee pain Pain description: quick stabbing pain- intermittent Aggravating factors: prolonged walking; playing golf Relieving factors: Rest  PRECAUTIONS: Fall  WEIGHT BEARING RESTRICTIONS: No  FALLS: Has patient fallen in last 6 months? No  LIVING ENVIRONMENT: Lives with: lives with their  spouse Lives in: House/apartment Stairs: Yes: Internal: 16 steps; on left going up Has following equipment at home: Single point cane and Walker - 2 wheeled  PLOF: Independent  PATIENT GOALS: Patient reports wanting to improve my balance.  OBJECTIVE:   DIAGNOSTIC FINDINGS: none recent  COGNITION: Overall cognitive status: Within functional limits for tasks assessed   SENSATION: Patient reports some decreased  sensation in left LE and right UE but intact with light touch today.   COORDINATION: Minimal delay- yet able to follow all commands.  Intact heel to shin  EDEMA:  None observed  MUSCLE TONE:  BLE- Within functional limits  DTRs:  Not tested  POSTURE: rounded shoulders and forward head  LOWER EXTREMITY ROM:     Each LE - WNL with all Active ROM - hip/knee/ankle   LOWER EXTREMITY MMT:    MMT Right Eval Left Eval  Hip flexion 4+ 4+  Hip extension 4 4  Hip abduction 4+ 4+  Hip adduction 4+ 4+  Hip internal rotation    Hip external rotation    Knee flexion 5 5  Knee extension 4+* 4+*  Ankle dorsiflexion 4+ 4+  Ankle plantarflexion 4 4  Ankle inversion    Ankle eversion    (Blank rows = not tested)   GAIT: Gait pattern: antalgic Distance walked: 200+ Assistive device utilized: None Level of assistance: SBA Comments: mild unsteadiness with turning - reciprocal steps  FUNCTIONAL TESTS:  5 times sit to stand: 15.2 sec without UE support Timed up and go (TUG): 12.59 sec 10 meter walk test: 0.94 m/s Berg Balance Scale: Deferred to more dynamic balance test Dynamic Gait Index: deferred Function Gait assessment: 20/30   PATIENT SURVEYS:  FOTO 52  TODAY'S TREATMENT:                                                                                                                              DATE: 08/24/22  Physical therapy treatment session today consisted of completing assessment of goals and administration of testing as demonstrated and documented  in flow sheet, treatment, and goals section of this note. Addition treatments may be found below.        PATIENT EDUCATION: Education details: Exercise technique and balance education into importance of muscle strength, sensory components of balance Person educated: Patient Education method: Explanation, Demonstration, Tactile cues, and Verbal cues Education comprehension: verbalized understanding, returned demonstration, verbal cues required, tactile cues required, and needs further education  HOME EXERCISE PROGRAM: Access Code: [email or web address] URL: [email or web address] Date: 08/24/2022 Prepared by: Maureen Ralphs  Exercises - Single Leg Stance  - 3 x weekly - 5-10 sets - as long as you can up to 10 sec hold - Tandem Stance  - 3 x weekly - 3 sets - 20-30 sec each le hold  GOALS: Goals reviewed with patient? Yes  SHORT TERM GOALS: Target date: 08/31/2022  Patient will be independent in home exercise program to improve strength/mobility for better functional independence with ADLs.  Baseline: EVAL- No formal HEP in place. Reviewed walking, STS, previous HEP from last episode of care and adding SLS and tandem- patient verbalized understanding and confidence in his ability to perform on his own.  Goal status: MET    LONG TERM GOALS: Target date: 10/12/2022  Pt will increase by at least 0.13 m/s in order to  demonstrate clinically significant improvement in community ambulation.   Baseline: EVAL= 0.94 m/s; 08/24/2022= 1.0 m/s Goal status: PROGRESSING   2.  Patient will increase Functional Gait Assessment score to >23/30 as to reduce fall risk and improve dynamic gait safety with community ambulation.  Baseline: EVAL= 20/30; 08/24/2022= 26/30 Goal status: MET  3.  Patient will increase FOTO score to equal to or greater than 54 % to demonstrate statistically significant improvement in mobility and quality of life.  Baseline: EVAL= 52; 08/24/2022= 62 Goal status:  MET  4.  Pt will decrease 5TSTS by at least 3 seconds in order to demonstrate clinically significant improvement in LE strength. Baseline: EVAL= 15.2 sec without UE Support; 6/10/204= 11.2 sec without UE Support Goal status: MET    ASSESSMENT:  CLINICAL IMPRESSION: Patient presents with good motivation for today's assessment/progress note visit. He was reassessed and demonstrated great progress toward all established goals. He exhibited improved self perceived ability with balance as well as improved FGA score. He demonstrated improved LE strength with 5 time sit to stand and improved overall gait speed. He met all goals except walking speed but did improve overall. He stated he felt comfortable with discharge today based on his progress and knowledge of exercises to continue to improve his balance at home. He is appropriate for discharge from outpatient PT services with majority of goals met.     OBJECTIVE IMPAIRMENTS: Abnormal gait, decreased activity tolerance, decreased balance, decreased coordination, decreased endurance, decreased mobility, difficulty walking, decreased strength, impaired flexibility, and pain.   ACTIVITY LIMITATIONS: carrying, lifting, bending, standing, squatting, and stairs  PARTICIPATION LIMITATIONS: cleaning, shopping, community activity, and yard work  PERSONAL FACTORS: Age and 3+ comorbidities: HTN, CKD, Prostate cancer  are also affecting patient's functional outcome.   REHAB POTENTIAL: Good  CLINICAL DECISION MAKING: Stable/uncomplicated  EVALUATION COMPLEXITY: Low  PLAN:  PT FREQUENCY: 1-2x/week  PT DURATION: 12 weeks  PLANNED INTERVENTIONS: Therapeutic exercises, Therapeutic activity, Neuromuscular re-education, Balance training, Gait training, Patient/Family education, Self Care, Joint mobilization, Stair training, Vestibular training, Canalith repositioning, DME instructions, Dry Needling, Spinal manipulation, Spinal mobilization, Cryotherapy,  Moist heat, Taping, Ultrasound, and Manual therapy  PLAN FOR NEXT SESSION: Discharge to a home program today.  Lenda Kelp, PT 08/24/2022, 10:36 AM  10:36 AM, 08/24/22

## 2022-08-27 ENCOUNTER — Ambulatory Visit: Payer: Medicare Other

## 2022-08-27 DIAGNOSIS — G4733 Obstructive sleep apnea (adult) (pediatric): Secondary | ICD-10-CM | POA: Diagnosis not present

## 2022-08-28 DIAGNOSIS — H52223 Regular astigmatism, bilateral: Secondary | ICD-10-CM | POA: Diagnosis not present

## 2022-08-28 DIAGNOSIS — Z9842 Cataract extraction status, left eye: Secondary | ICD-10-CM | POA: Diagnosis not present

## 2022-08-28 DIAGNOSIS — Z9841 Cataract extraction status, right eye: Secondary | ICD-10-CM | POA: Diagnosis not present

## 2022-08-31 ENCOUNTER — Ambulatory Visit: Payer: Medicare Other

## 2022-09-04 ENCOUNTER — Ambulatory Visit: Payer: Medicare Other

## 2022-09-07 ENCOUNTER — Ambulatory Visit: Payer: Medicare Other

## 2022-09-10 ENCOUNTER — Ambulatory Visit: Payer: Medicare Other

## 2022-09-13 DIAGNOSIS — R6889 Other general symptoms and signs: Secondary | ICD-10-CM | POA: Diagnosis not present

## 2022-09-13 DIAGNOSIS — I10 Essential (primary) hypertension: Secondary | ICD-10-CM | POA: Diagnosis not present

## 2022-09-13 DIAGNOSIS — I959 Hypotension, unspecified: Secondary | ICD-10-CM | POA: Diagnosis not present

## 2022-09-13 DIAGNOSIS — R531 Weakness: Secondary | ICD-10-CM | POA: Diagnosis not present

## 2022-09-13 DIAGNOSIS — Z743 Need for continuous supervision: Secondary | ICD-10-CM | POA: Diagnosis not present

## 2022-09-13 DIAGNOSIS — I491 Atrial premature depolarization: Secondary | ICD-10-CM | POA: Diagnosis not present

## 2022-09-13 DIAGNOSIS — R404 Transient alteration of awareness: Secondary | ICD-10-CM | POA: Diagnosis not present

## 2022-09-13 DIAGNOSIS — R918 Other nonspecific abnormal finding of lung field: Secondary | ICD-10-CM | POA: Diagnosis not present

## 2022-09-13 DIAGNOSIS — R001 Bradycardia, unspecified: Secondary | ICD-10-CM | POA: Diagnosis not present

## 2022-09-13 DIAGNOSIS — R42 Dizziness and giddiness: Secondary | ICD-10-CM | POA: Diagnosis not present

## 2022-09-13 DIAGNOSIS — R0989 Other specified symptoms and signs involving the circulatory and respiratory systems: Secondary | ICD-10-CM | POA: Diagnosis not present

## 2022-09-15 ENCOUNTER — Ambulatory Visit: Payer: Medicare Other

## 2022-09-21 ENCOUNTER — Ambulatory Visit: Payer: Medicare Other

## 2022-09-25 ENCOUNTER — Ambulatory Visit: Payer: Medicare Other

## 2022-09-26 DIAGNOSIS — G4733 Obstructive sleep apnea (adult) (pediatric): Secondary | ICD-10-CM | POA: Diagnosis not present

## 2022-09-28 ENCOUNTER — Ambulatory Visit: Payer: Medicare Other

## 2022-09-28 ENCOUNTER — Other Ambulatory Visit: Payer: Self-pay | Admitting: Internal Medicine

## 2022-09-28 DIAGNOSIS — F5102 Adjustment insomnia: Secondary | ICD-10-CM

## 2022-09-28 NOTE — Telephone Encounter (Signed)
Refilled: 03/24/2022 Last OV: 06/22/2022 Next OV: 12/25/2022

## 2022-10-02 ENCOUNTER — Ambulatory Visit: Payer: Medicare Other

## 2022-10-05 ENCOUNTER — Ambulatory Visit: Payer: Medicare Other

## 2022-10-09 ENCOUNTER — Ambulatory Visit: Payer: Medicare Other

## 2022-10-12 ENCOUNTER — Ambulatory Visit: Payer: Medicare Other

## 2022-10-16 ENCOUNTER — Ambulatory Visit: Payer: Medicare Other

## 2022-10-19 ENCOUNTER — Ambulatory Visit: Payer: Medicare Other

## 2022-10-23 ENCOUNTER — Other Ambulatory Visit (INDEPENDENT_AMBULATORY_CARE_PROVIDER_SITE_OTHER): Payer: Self-pay

## 2022-10-23 ENCOUNTER — Telehealth (INDEPENDENT_AMBULATORY_CARE_PROVIDER_SITE_OTHER): Payer: Self-pay | Admitting: Vascular Surgery

## 2022-10-23 ENCOUNTER — Ambulatory Visit: Payer: Medicare Other

## 2022-10-23 MED ORDER — CLOPIDOGREL BISULFATE 75 MG PO TABS: 75.0000 mg | ORAL_TABLET | Freq: Every day | ORAL | 11 refills | Status: DC

## 2022-10-23 NOTE — Telephone Encounter (Signed)
Patient called in stating that he needed to know that if still needed to be taking medication it was never discussed but he is needing to know because of pharmacy     clopidogrel (PLAVIX) 75 MG tablet   [facility] [address], [place] - [address]   Please call and advise

## 2022-10-23 NOTE — Telephone Encounter (Signed)
Patient was advise to continue taking Clopidogrel daily. Medication has been sent to [facility] with 11 refills.

## 2022-10-26 ENCOUNTER — Ambulatory Visit: Payer: Medicare Other

## 2022-10-30 ENCOUNTER — Ambulatory Visit: Payer: Medicare Other

## 2022-11-02 ENCOUNTER — Ambulatory Visit: Payer: Medicare Other

## 2022-11-06 ENCOUNTER — Ambulatory Visit: Payer: Medicare Other

## 2022-11-09 ENCOUNTER — Ambulatory Visit: Payer: Medicare Other

## 2022-11-13 ENCOUNTER — Ambulatory Visit: Payer: Medicare Other

## 2022-11-20 ENCOUNTER — Ambulatory Visit: Payer: Medicare Other

## 2022-11-23 ENCOUNTER — Ambulatory Visit: Payer: Medicare Other

## 2022-11-27 ENCOUNTER — Ambulatory Visit: Payer: Medicare Other

## 2022-11-30 ENCOUNTER — Ambulatory Visit: Payer: Medicare Other

## 2022-12-04 ENCOUNTER — Ambulatory Visit: Payer: Medicare Other

## 2022-12-07 ENCOUNTER — Ambulatory Visit: Payer: Medicare Other

## 2022-12-11 ENCOUNTER — Ambulatory Visit: Payer: Medicare Other

## 2022-12-14 ENCOUNTER — Ambulatory Visit: Payer: Medicare Other

## 2022-12-14 DIAGNOSIS — D2271 Melanocytic nevi of right lower limb, including hip: Secondary | ICD-10-CM | POA: Diagnosis not present

## 2022-12-14 DIAGNOSIS — D2261 Melanocytic nevi of right upper limb, including shoulder: Secondary | ICD-10-CM | POA: Diagnosis not present

## 2022-12-14 DIAGNOSIS — D2262 Melanocytic nevi of left upper limb, including shoulder: Secondary | ICD-10-CM | POA: Diagnosis not present

## 2022-12-14 DIAGNOSIS — L814 Other melanin hyperpigmentation: Secondary | ICD-10-CM | POA: Diagnosis not present

## 2022-12-14 DIAGNOSIS — D225 Melanocytic nevi of trunk: Secondary | ICD-10-CM | POA: Diagnosis not present

## 2022-12-14 DIAGNOSIS — D2272 Melanocytic nevi of left lower limb, including hip: Secondary | ICD-10-CM | POA: Diagnosis not present

## 2022-12-14 DIAGNOSIS — L570 Actinic keratosis: Secondary | ICD-10-CM | POA: Diagnosis not present

## 2022-12-14 DIAGNOSIS — D485 Neoplasm of uncertain behavior of skin: Secondary | ICD-10-CM | POA: Diagnosis not present

## 2022-12-14 DIAGNOSIS — Z85828 Personal history of other malignant neoplasm of skin: Secondary | ICD-10-CM | POA: Diagnosis not present

## 2022-12-18 ENCOUNTER — Ambulatory Visit: Payer: Medicare Other

## 2022-12-21 ENCOUNTER — Ambulatory Visit: Payer: Medicare Other

## 2022-12-25 ENCOUNTER — Encounter: Payer: Self-pay | Admitting: Internal Medicine

## 2022-12-25 ENCOUNTER — Ambulatory Visit: Payer: Medicare Other

## 2022-12-25 ENCOUNTER — Ambulatory Visit: Payer: Medicare Other | Admitting: Internal Medicine

## 2022-12-25 VITALS — BP 156/76 | HR 45 | Temp 97.4°F | Ht 72.0 in | Wt 206.8 lb

## 2022-12-25 DIAGNOSIS — R5383 Other fatigue: Secondary | ICD-10-CM

## 2022-12-25 DIAGNOSIS — E785 Hyperlipidemia, unspecified: Secondary | ICD-10-CM | POA: Diagnosis not present

## 2022-12-25 DIAGNOSIS — Z23 Encounter for immunization: Secondary | ICD-10-CM | POA: Diagnosis not present

## 2022-12-25 DIAGNOSIS — F5102 Adjustment insomnia: Secondary | ICD-10-CM | POA: Diagnosis not present

## 2022-12-25 DIAGNOSIS — I10 Essential (primary) hypertension: Secondary | ICD-10-CM | POA: Diagnosis not present

## 2022-12-25 DIAGNOSIS — D751 Secondary polycythemia: Secondary | ICD-10-CM | POA: Diagnosis not present

## 2022-12-25 DIAGNOSIS — I6930 Unspecified sequelae of cerebral infarction: Secondary | ICD-10-CM | POA: Diagnosis not present

## 2022-12-25 DIAGNOSIS — Z8546 Personal history of malignant neoplasm of prostate: Secondary | ICD-10-CM

## 2022-12-25 DIAGNOSIS — G4733 Obstructive sleep apnea (adult) (pediatric): Secondary | ICD-10-CM | POA: Diagnosis not present

## 2022-12-25 LAB — CBC WITH DIFFERENTIAL/PLATELET
Basophils Absolute: 0 10*3/uL (ref 0.0–0.1)
Basophils Relative: 0.6 % (ref 0.0–3.0)
Eosinophils Absolute: 0.2 10*3/uL (ref 0.0–0.7)
Eosinophils Relative: 3.5 % (ref 0.0–5.0)
HCT: 51.2 % (ref 39.0–52.0)
Hemoglobin: 17.3 g/dL — ABNORMAL HIGH (ref 13.0–17.0)
Lymphocytes Relative: 18.1 % (ref 12.0–46.0)
Lymphs Abs: 1.1 10*3/uL (ref 0.7–4.0)
MCHC: 33.8 g/dL (ref 30.0–36.0)
MCV: 92.8 fL (ref 78.0–100.0)
Monocytes Absolute: 0.8 10*3/uL (ref 0.1–1.0)
Monocytes Relative: 12.9 % — ABNORMAL HIGH (ref 3.0–12.0)
Neutro Abs: 3.8 10*3/uL (ref 1.4–7.7)
Neutrophils Relative %: 64.9 % (ref 43.0–77.0)
Platelets: 197 10*3/uL (ref 150.0–400.0)
RBC: 5.52 Mil/uL (ref 4.22–5.81)
RDW: 13.7 % (ref 11.5–15.5)
WBC: 5.9 10*3/uL (ref 4.0–10.5)

## 2022-12-25 LAB — COMPREHENSIVE METABOLIC PANEL
ALT: 24 U/L (ref 0–53)
AST: 23 U/L (ref 0–37)
Albumin: 4.2 g/dL (ref 3.5–5.2)
Alkaline Phosphatase: 92 U/L (ref 39–117)
BUN: 18 mg/dL (ref 6–23)
CO2: 24 meq/L (ref 19–32)
Calcium: 9.9 mg/dL (ref 8.4–10.5)
Chloride: 106 meq/L (ref 96–112)
Creatinine, Ser: 1.19 mg/dL (ref 0.40–1.50)
GFR: 57.68 mL/min — ABNORMAL LOW (ref >60.00)
Glucose, Bld: 90 mg/dL (ref 70–99)
Potassium: 4.7 meq/L (ref 3.5–5.1)
Sodium: 139 meq/L (ref 135–145)
Total Bilirubin: 1.6 mg/dL — ABNORMAL HIGH (ref 0.2–1.2)
Total Protein: 6.3 g/dL (ref 6.0–8.3)

## 2022-12-25 LAB — LIPID PANEL
Cholesterol: 133 mg/dL (ref 0–200)
HDL: 63.8 mg/dL (ref >39.00)
LDL Cholesterol: 50 mg/dL (ref 0–99)
NonHDL: 69.48
Total CHOL/HDL Ratio: 2
Triglycerides: 97 mg/dL (ref 0.0–149.0)
VLDL: 19.4 mg/dL (ref 0.0–40.0)

## 2022-12-25 LAB — TSH: TSH: 3.22 u[IU]/mL (ref 0.35–5.50)

## 2022-12-25 LAB — HEMOGLOBIN A1C: Hgb A1c MFr Bld: 5.9 % (ref 4.6–6.5)

## 2022-12-25 LAB — LDL CHOLESTEROL, DIRECT: Direct LDL: 53 mg/dL

## 2022-12-25 MED ORDER — PRALUENT 150 MG/ML ~~LOC~~ SOAJ: 150.0000 mg | SUBCUTANEOUS | 6 refills | Status: DC

## 2022-12-25 MED ORDER — LOSARTAN POTASSIUM 100 MG PO TABS: 100.0000 mg | ORAL_TABLET | Freq: Every day | ORAL | 2 refills | Status: DC

## 2022-12-25 NOTE — Assessment & Plan Note (Addendum)
MANAGED WITH PRALUENT AND ZETIA . LD is at goal   Lab Results  Component Value Date   CHOL 133 12/25/2022   HDL 63.80 12/25/2022   LDLCALC 50 12/25/2022   LDLDIRECT 53.0 12/25/2022   TRIG 97.0 12/25/2022   CHOLHDL 2 12/25/2022

## 2022-12-25 NOTE — Progress Notes (Signed)
Subjective:  Patient ID: Bobby Murillo, male    DOB: [birth year]  Age: 82 y.o. [name]: [id number]  CC: The primary encounter diagnosis was Other fatigue. Diagnoses of Hyperlipidemia LDL goal <100, Essential hypertension, History of prostate cancer, OSA (obstructive sleep apnea), Need for influenza vaccination, Erythrocytosis, History of CVA with residual deficit, and Insomnia due to psychological stress were also pertinent to this visit.   HPI Bobby Murillo presents for  Chief Complaint  Patient presents with   Medical Management of Chronic Issues    6 month follow up    1) Hypertension: patient is not check  blood pressure.  Had an episode of hypotension while [place] the beach. EMS noted sbp of 60.  Evaluated at [facility] ER .  AMI ruled out .  No medication changes made, but he Is not sure if he is taking his medications as prescribed: losartan 100 mg daily    2) LCarotid stenosis>  s/p left carotid stent oct 2023 after an endarterectomy in Dec 2022  was complicated by a CVA .  He is not aking plavix  for  UNCLEAR REASONS  3) General:  BALANCE ISSUES REMAIN ,  FEELING HIS AGE  FRIENDS ARE DYING NO LONGER PLAYING TENNIS BUT  . PLAYING GOLF 2 TIMES WEEKLY.  NOT WALKING THE COURSE DUE TO KNEE PAIN .  CAN'T HIT THE BALL AS FAR OR AS STRAIGHT . DENIES OVERT DEPRESSION   5) OSA:  HAS STOPPED USING CPAP AS OF 2 WEEKS AGO DUE TO chronic intolerance .  Does not want an alternative     Outpatient Medications Prior to Visit  Medication Sig Dispense Refill   acetaminophen (TYLENOL) 325 MG tablet Take 650 mg by mouth every 6 (six) hours as needed for moderate pain.     ALPRAZolam (XANAX) 0.5 MG tablet TAKE 1 TABLET BY MOUTH AT BEDTIME AS NEEDED FOR SLEEP 30 tablet 5   aspirin EC 81 MG tablet Take 81 mg by mouth daily. Swallow whole.     Collagen-Vitamin C-Biotin (COLLAGEN 1500/C PO) Take by mouth.     ezetimibe (ZETIA) 10 MG tablet Take 1 tablet (10 mg total) by mouth daily. 90 tablet 3    polycarbophil (FIBERCON) 625 MG tablet Take 625 mg by mouth daily.     Alirocumab (PRALUENT) 150 MG/ML SOAJ Inject 150 mg into the skin every 14 (fourteen) days. 2 mL 6   losartan (COZAAR) 50 MG tablet Take 2 tablets (100 mg total) by mouth daily. 180 tablet 2   clopidogrel (PLAVIX) 75 MG tablet Take 1 tablet (75 mg total) by mouth daily. (Patient not taking: Reported on 12/25/2022) 30 tablet 11   Glucosamine-Chondroitin (GLUCOSAMINE CHONDR COMPLEX PO) Take 1 tablet by mouth daily. tab by mouth daily (Patient not taking: Reported on 12/25/2022)     No facility-administered medications prior to visit.    Review of Systems;  Patient denies headache, fevers, malaise, unintentional weight loss, skin rash, eye pain, sinus congestion and sinus pain, sore throat, dysphagia,  hemoptysis , cough, dyspnea, wheezing, chest pain, palpitations, orthopnea, edema, abdominal pain, nausea, melena, diarrhea, constipation, flank pain, dysuria, hematuria, urinary  Frequency, nocturia, numbness, tingling, seizures,  Focal weakness, Loss of consciousness,  Tremor, insomnia, depression, anxiety, and suicidal ideation.      Objective:  BP (!) 156/76   Pulse (!) 45   Temp (!) 97.4 F (36.3 C) (Oral)   Ht 6' (1.829 m)   Wt 206 lb 12.8 oz (93.8 kg)  SpO2 96%   BMI 28.05 kg/m   BP Readings from Last 3 Encounters:  12/25/22 (!) 156/76  06/22/22 124/86  05/18/22 138/87    Wt Readings from Last 3 Encounters:  12/25/22 206 lb 12.8 oz (93.8 kg)  06/22/22 205 lb 3.2 oz (93.1 kg)  05/18/22 212 lb (96.2 kg)    Physical Exam Vitals reviewed.  Constitutional:      General: He is not in acute distress.    Appearance: Normal appearance. He is normal weight. He is not ill-appearing, toxic-appearing or diaphoretic.  HENT:     Head: Normocephalic.  Eyes:     General: No scleral icterus.       Right eye: No discharge.        Left eye: No discharge.     Conjunctiva/sclera: Conjunctivae normal.  Cardiovascular:      Rate and Rhythm: Normal rate and regular rhythm.     Heart sounds: Normal heart sounds.  Pulmonary:     Effort: Pulmonary effort is normal. No respiratory distress.     Breath sounds: Normal breath sounds.  Musculoskeletal:        General: Normal range of motion.     Cervical back: Normal range of motion.  Skin:    General: Skin is warm and dry.  Neurological:     General: No focal deficit present.     Mental Status: He is alert and oriented to person, place, and time. Mental status is at baseline.  Psychiatric:        Mood and Affect: Mood normal.        Behavior: Behavior normal.        Thought Content: Thought content normal.        Judgment: Judgment normal.    Lab Results  Component Value Date   HGBA1C 5.9 12/25/2022   HGBA1C 6.2 05/27/2021   HGBA1C 6.0 (H) 01/03/2021    Lab Results  Component Value Date   CREATININE 1.19 12/25/2022   CREATININE 1.30 06/19/2022   CREATININE 1.32 03/23/2022    Lab Results  Component Value Date   WBC 5.9 12/25/2022   HGB 17.3 (H) 12/25/2022   HCT 51.2 12/25/2022   PLT 197.0 12/25/2022   GLUCOSE 90 12/25/2022   CHOL 133 12/25/2022   TRIG 97.0 12/25/2022   HDL 63.80 12/25/2022   LDLDIRECT 53.0 12/25/2022   LDLCALC 50 12/25/2022   ALT 24 12/25/2022   AST 23 12/25/2022   NA 139 12/25/2022   K 4.7 12/25/2022   CL 106 12/25/2022   CREATININE 1.19 12/25/2022   BUN 18 12/25/2022   CO2 24 12/25/2022   TSH 3.22 12/25/2022   PSA 0.00 (L) 05/27/2021   INR 1.0 02/22/2021   HGBA1C 5.9 12/25/2022   MICROALBUR <0.7 03/23/2022    PERIPHERAL VASCULAR CATHETERIZATION  Result Date: 12/30/2021 See surgical note for result.   Assessment & Plan:  .Other fatigue -     TSH -     CBC with Differential/Platelet  Hyperlipidemia LDL goal <100 Assessment & Plan: MANAGED WITH PRALUENT AND ZETIA . LD is at goal   Lab Results  Component Value Date   CHOL 133 12/25/2022   HDL 63.80 12/25/2022   LDLCALC 50 12/25/2022   LDLDIRECT  53.0 12/25/2022   TRIG 97.0 12/25/2022   CHOLHDL 2 12/25/2022     Orders: -     Lipid panel -     LDL cholesterol, direct -     Hemoglobin A1c -  Praluent; Inject 1 mL (150 mg total) into the skin every 14 (fourteen) days.  Dispense: 2 mL; Refill: 6  Essential hypertension Assessment & Plan: EEVATED TODAY BECAUSE HE HAS ONLY BEEN TAKING 50 MG LOSARTAN  . Increase dose to 100 mg daily   Orders: -     Losartan Potassium; Take 1 tablet (100 mg total) by mouth daily.  Dispense: 90 tablet; Refill: 2 -     Comprehensive metabolic panel  History of prostate cancer Assessment & Plan: HE DECLINES CONTINUED SURVEILLANCE    OSA (obstructive sleep apnea) Assessment & Plan: UNTREATED DUE TO INTOLERANCE OF NASAL PILLOWS.  HE DECLINES REFERRAL TO SLEEP SPECIALIST    Need for influenza vaccination -     Flu Vaccine Trivalent High Dose (Fluad)  Erythrocytosis Assessment & Plan: Recurrent  Likely due to untreated OSA per hematology.    He has declined use of CPAP due to intolerance    Lab Results  Component Value Date   WBC 5.9 12/25/2022   HGB 17.3 (H) 12/25/2022   HCT 51.2 12/25/2022   MCV 92.8 12/25/2022   PLT 197.0 12/25/2022      History of CVA with residual deficit Assessment & Plan: S/p CEA of Left ICA secondary to 7o to 99% stenosis,  With 45% stenosis noted on follow up CTA. Currently taking ASA 325 mg daily,  Zetia. And Praluent.  Has allowed Plavix to  lapse.    Has declined additional PT despite reporting balance issues.   Insomnia due to psychological stress Assessment & Plan: Continue alprazolam low dose.        I provided 43 minutes of face-to-face time during this encounter reviewing patient's last visit with me, patient's  most recent visit with cardiology,   vascular surgery,  and neurology,  recent surgical and non surgical procedures, previous  labs and imaging studies, counseling on currently addressed issues,  and post visit ordering to diagnostics  and therapeutics .   Follow-up: Return in about 6 months (around 06/25/2023).   Sherlene Shams, MD

## 2022-12-25 NOTE — Patient Instructions (Addendum)
Resume plavix (clopidogrel) for stroke prevention  You should be taking 100 mg of losartan  daily  for blood pressure . .your goal is 140/90 or less .  PLEASE CHECK DAILY FOR ONE WEEK AND SEND ME READINGS   I RECOMMEND USING A WEEKLY PILL BOX TO HELP IMPROVE COMPLIANCE   WE WILL STOP CHECKING YOUR PSA PER OUR DISCUSSION TODAY   I recommend that you have  The TDaP vaccine BY YOUR LOCAL PHARMACY .

## 2022-12-25 NOTE — Assessment & Plan Note (Addendum)
EEVATED TODAY BECAUSE HE HAS ONLY BEEN TAKING 50 MG LOSARTAN  . Increase dose to 100 mg daily

## 2022-12-25 NOTE — Assessment & Plan Note (Signed)
HE DECLINES CONTINUED SURVEILLANCE

## 2022-12-25 NOTE — Assessment & Plan Note (Signed)
UNTREATED DUE TO INTOLERANCE OF NASAL PILLOWS.  HE DECLINES REFERRAL TO SLEEP SPECIALIST

## 2022-12-27 NOTE — Assessment & Plan Note (Signed)
S/p CEA of Left ICA secondary to 7o to 99% stenosis,  With 45% stenosis noted on follow up CTA. Currently taking ASA 325 mg daily,  Zetia. And Praluent.  Has allowed Plavix to  lapse.    Has declined additional PT despite reporting balance issues.

## 2022-12-27 NOTE — Assessment & Plan Note (Signed)
Recurrent  Likely due to untreated OSA per hematology.    He has declined use of CPAP due to intolerance    Lab Results  Component Value Date   WBC 5.9 12/25/2022   HGB 17.3 (H) 12/25/2022   HCT 51.2 12/25/2022   MCV 92.8 12/25/2022   PLT 197.0 12/25/2022

## 2022-12-27 NOTE — Assessment & Plan Note (Signed)
Continue alprazolam low dose.

## 2022-12-28 ENCOUNTER — Ambulatory Visit: Payer: Medicare Other

## 2023-01-01 ENCOUNTER — Ambulatory Visit: Payer: Medicare Other

## 2023-01-04 ENCOUNTER — Ambulatory Visit: Payer: Medicare Other

## 2023-01-08 ENCOUNTER — Encounter: Payer: Self-pay | Admitting: Internal Medicine

## 2023-01-08 ENCOUNTER — Ambulatory Visit: Payer: Medicare Other

## 2023-01-09 ENCOUNTER — Other Ambulatory Visit: Payer: Self-pay | Admitting: Internal Medicine

## 2023-01-09 ENCOUNTER — Encounter: Payer: Self-pay | Admitting: Internal Medicine

## 2023-01-09 MED ORDER — AMLODIPINE BESYLATE 2.5 MG PO TABS: 2.5000 mg | ORAL_TABLET | Freq: Every day | ORAL | 1 refills | Status: DC

## 2023-01-11 ENCOUNTER — Ambulatory Visit: Payer: Medicare Other

## 2023-01-15 ENCOUNTER — Ambulatory Visit: Payer: Medicare Other

## 2023-01-18 ENCOUNTER — Ambulatory Visit: Payer: Medicare Other

## 2023-01-22 ENCOUNTER — Ambulatory Visit: Payer: Medicare Other

## 2023-01-25 ENCOUNTER — Ambulatory Visit: Payer: Medicare Other

## 2023-01-29 ENCOUNTER — Ambulatory Visit: Payer: Medicare Other

## 2023-02-01 ENCOUNTER — Ambulatory Visit: Payer: Medicare Other

## 2023-02-01 ENCOUNTER — Encounter: Payer: Self-pay | Admitting: Cardiology

## 2023-02-01 ENCOUNTER — Ambulatory Visit: Payer: Medicare Other | Attending: Cardiology | Admitting: Cardiology

## 2023-02-01 VITALS — BP 118/64 | HR 61 | Ht 72.0 in | Wt 206.6 lb

## 2023-02-01 DIAGNOSIS — I6522 Occlusion and stenosis of left carotid artery: Secondary | ICD-10-CM | POA: Diagnosis not present

## 2023-02-01 DIAGNOSIS — R072 Precordial pain: Secondary | ICD-10-CM | POA: Diagnosis not present

## 2023-02-01 DIAGNOSIS — E7800 Pure hypercholesterolemia, unspecified: Secondary | ICD-10-CM | POA: Diagnosis not present

## 2023-02-01 DIAGNOSIS — I10 Essential (primary) hypertension: Secondary | ICD-10-CM

## 2023-02-01 NOTE — Patient Instructions (Signed)
Medication Instructions:   Your physician recommends that you continue on your current medications as directed. Please refer to the Current Medication list given to you today.  *If you need a refill on your cardiac medications before your next appointment, please call your pharmacy*   Lab Work:  None Ordered  If you have labs (blood work) drawn today and your tests are completely normal, you will receive your results only by: MyChart Message (if you have MyChart) OR A paper copy in the mail If you have any lab test that is abnormal or we need to change your treatment, we will call you to review the results.   Testing/Procedures:  [facility] MYOVIEW  Your Provider has ordered a Stress Test with nuclear imaging. The purpose of this test is to evaluate the blood supply to your heart muscle. This procedure is referred to as a "Non-Invasive Stress Test." This is because other than having an IV started in your vein, nothing is inserted or "invades" your body. Cardiac stress tests are done to find areas of poor blood flow to the heart by determining the extent of coronary artery disease (CAD). Some patients exercise on a treadmill, which naturally increases the blood flow to your heart, while others who are unable to walk on a treadmill due to physical limitations have a pharmacologic/chemical stress agent called Lexiscan. This medicine will mimic walking on a treadmill by temporarily increasing your coronary blood flow.     REPORT TO [facility] MEDICAL MALL ENTRANCE  **Proceed to the 1st desk on the right, REGISTRATION, to check in**  Please note: this test may take anywhere between 2-4 hours to complete    Instructions regarding medication:   __XXX_:   You may take all of your regular morning medications the day of your test unless listed below.    How to prepare for your Myoview test:  Do not eat or drink for 6 hours prior to the test No caffeine for 24 hours prior to the test No smoking 24  hours prior to the test. Ladies, please do not wear dresses.  Skirts or pants are appropriate. Please wear a short sleeve shirt. No perfume, cologne or lotion. Wear comfortable walking shoes. No heels!   PLEASE NOTIFY THE OFFICE AT LEAST 24 HOURS IN ADVANCE IF YOU ARE UNABLE TO KEEP YOUR APPOINTMENT.  [phone] AND  PLEASE NOTIFY NUCLEAR MEDICINE AT [facility] AT LEAST 24 HOURS IN ADVANCE IF YOU ARE UNABLE TO KEEP YOUR APPOINTMENT. [phone]       Follow-Up: At [facility], you and your health needs are our priority.  As part of our continuing mission to provide you with exceptional heart care, we have created designated Provider Care Teams.  These Care Teams include your primary Cardiologist (physician) and Advanced Practice Providers (APPs -  Physician Assistants and Nurse Practitioners) who all work together to provide you with the care you need, when you need it.  We recommend signing up for the patient portal called "MyChart".  Sign up information is provided on this After Visit Summary.  MyChart is used to connect with patients for Virtual Visits (Telemedicine).  Patients are able to view lab/test results, encounter notes, upcoming appointments, etc.  Non-urgent messages can be sent to your provider as well.   To learn more about what you can do with MyChart, go to [email or web address].    Your next appointment:   12 month(s)  Provider:   You may see Debbe Odea, MD or one of the  following Advanced Practice Providers on your designated Care Team:   Nicolasa Ducking, NP Eula Listen, PA-C Cadence Fransico Michael, PA-C Charlsie Quest, NP Carlos Levering, NP

## 2023-02-01 NOTE — Progress Notes (Signed)
Cardiology Office Note:    Date:  02/01/2023   ID:  Bobby Murillo, DOB [birth year], [name] [id number]  PCP:  Bobby Shams, MD   [facility] Providers Cardiologist:  Bobby Odea, MD     Referring MD: Bobby Shams, MD   Chief Complaint  Patient presents with   Follow-up    Patient denies new or acute cardiac problems/concerns today.  Would like to discuss possible changing Praluent due to cost.    History of Present Illness:    Bobby Murillo is a 82 y.o. male with a hx of hyperlipidemia, hypertension, CKD 3, left CVA , carotid artery stenosis s/p carotid stent 12/2021, L. CEA 02/2021 who presents for follow-up.  States being symptoms of left-sided chest pain after carotid stent placement.  Symptoms occur about once a month, not associated with exertion, lasting a few seconds.  Denies dizziness, syncope or presyncope.  Compliant with Praluent as prescribed, states cost has increased over the past month or so.  Also has occasional tingling in his right arm and sometimes in his legs when legs are crossed.     Prior notes Echocardiogram 01/04/2021 showed normal ejection fraction EF 55 to 60%, no gross structural abnormalities. Lexiscan Myoview 01/2021 no evidence for ischemia, minimal coronary calcifications Cardiac monitor 03/2018 20, frequent PACs,   Past Medical History:  Diagnosis Date   Abnormal LFTs    Acute cerebrovascular accident (CVA) due to thrombosis of left middle cerebral artery (HCC) 01/05/2021   Anxiety    Aortic atherosclerosis (HCC)    Arthritis    Basal cell carcinoma 09/14/2010   left calf    Bilateral carotid artery stenosis    a.) Carotid doppler 01/03/2021: 70-99% LICA; 0-49% RICA. b.) CTA neck 01/30/2021: critical stenosis LEFT carotid bulb.   Carotid stenosis, symptomatic, with infarction (HCC) 01/20/2021   CKD (chronic kidney disease), stage III (HCC)    History of kidney stones    Hyperlipidemia    Hypertension    Long term current use of  antithrombotics/antiplatelets    a.) DAPT therapy (ASA + clopidogrel)   Prostate cancer (HCC) 03/16/2008   Dr. Baxter Flattery   Sinus bradycardia by electrocardiogram    Stroke (HCC) 01/03/2021   a.) Acute infarction affecting the cortex. ?? incidental punctate acute white matter infarction just posterior to the atrium of the left lateral ventricle. Stenosis of the right M2 branch inferior division. Severe atherosclerotic disease of both PCAs with severe stenoses.    Past Surgical History:  Procedure Laterality Date   CAROTID PTA/STENT INTERVENTION Left 12/30/2021   Procedure: CAROTID PTA/STENT INTERVENTION;  Surgeon: Renford Dills, MD;  Location: [facility] INVASIVE CV LAB;  Service: Cardiovascular;  Laterality: Left;   CATARACT EXTRACTION Bilateral    05/2021   COLONOSCOPY     ENDARTERECTOMY Left 02/19/2021   Procedure: ENDARTERECTOMY CAROTID;  Surgeon: Renford Dills, MD;  Location: [facility] ORS;  Service: Vascular;  Laterality: Left;   PROSTATECTOMY  11/14/2008   transurethral, radical, Dr. Carin Primrose    Current Medications: Current Meds  Medication Sig   acetaminophen (TYLENOL) 325 MG tablet Take 650 mg by mouth every 6 (six) hours as needed for moderate pain.   Alirocumab (PRALUENT) 150 MG/ML SOAJ Inject 1 mL (150 mg total) into the skin every 14 (fourteen) days.   ALPRAZolam (XANAX) 0.5 MG tablet TAKE 1 TABLET BY MOUTH AT BEDTIME AS NEEDED FOR SLEEP   amLODipine (NORVASC) 2.5 MG tablet Take 1 tablet (2.5 mg total) by mouth daily.  aspirin EC 81 MG tablet Take 81 mg by mouth daily. Swallow whole.   clopidogrel (PLAVIX) 75 MG tablet Take 1 tablet (75 mg total) by mouth daily.   Collagen-Vitamin C-Biotin (COLLAGEN 1500/C PO) Take by mouth.   ezetimibe (ZETIA) 10 MG tablet Take 1 tablet (10 mg total) by mouth daily.   losartan (COZAAR) 100 MG tablet Take 1 tablet (100 mg total) by mouth daily.   polycarbophil (FIBERCON) 625 MG tablet Take 625 mg by mouth daily.     Allergies:   Alfuzosin and  Pravastatin sodium   Social History   Socioeconomic History   Marital status: Married    Spouse name: Bonita Quin   Number of children: Not on file   Years of education: Not on file   Highest education level: Not on file  Occupational History   Not on file  Tobacco Use   Smoking status: Never   Smokeless tobacco: Never  Vaping Use   Vaping status: Never Used  Substance and Sexual Activity   Alcohol use: Yes    Alcohol/week: 8.0 standard drinks of alcohol    Types: 4 Glasses of wine, 4 Shots of liquor per week    Comment: occasional   Drug use: No   Sexual activity: Yes  Other Topics Concern   Not on file  Social History Narrative   Patient lives in [place] with his wife.  Non-smoker.  Drinks over the weekend.  Retired from [employer].      Right handed   Social Determinants of Health   Financial Resource Strain: Low Risk  (04/09/2021)   Overall Financial Resource Strain (CARDIA)    Difficulty of Paying Living Expenses: Not hard at all  Food Insecurity: No Food Insecurity (12/31/2021)   Hunger Vital Sign    Worried About Running Out of Food in the Last Year: Never true    Ran Out of Food in the Last Year: Never true  Transportation Needs: No Transportation Needs (12/31/2021)   PRAPARE - Administrator, Civil Service (Medical): No    Lack of Transportation (Non-Medical): No  Physical Activity: Insufficiently Active (04/09/2021)   Exercise Vital Sign    Days of Exercise per Week: 1 day    Minutes of Exercise per Session: 90 min  Stress: No Stress Concern Present (04/09/2021)   [employer] of Occupational Health - Occupational Stress Questionnaire    Feeling of Stress : Not at all  Social Connections: Unknown (09/13/2022)   Received from [facility]   Social Network    Social Network: Not on file     Family History: The patient's family history includes Alzheimer's disease in his mother and sister; Heart disease (age of onset: 44) in his  paternal uncle; Prostate cancer in his father; Stroke in his father and paternal aunt.  ROS:   Please see the history of present illness.     All other systems reviewed and are negative.  EKGs/Labs/Other Studies Reviewed:    The following studies were reviewed today:  EKG Interpretation Date/Time:  Monday February 01 2023 08:42:03 EST Ventricular Rate:  61 PR Interval:  154 QRS Duration:  88 QT Interval:  406 QTC Calculation: 408 R Axis:   27  Text Interpretation: Sinus rhythm with Premature atrial complexes Confirmed by Bobby Murillo ([id number]) on 02/01/2023 8:50:15 AM    Recent Labs: 12/25/2022: ALT 24; BUN 18; Creatinine, Ser 1.19; Hemoglobin 17.3; Platelets 197.0; Potassium 4.7; Sodium 139; TSH 3.22  Recent Lipid Panel  Component Value Date/Time   CHOL 133 12/25/2022 1009   CHOL 280 (H) 10/23/2011 1142   TRIG 97.0 12/25/2022 1009   HDL 63.80 12/25/2022 1009   HDL 63 10/23/2011 1142   CHOLHDL 2 12/25/2022 1009   VLDL 19.4 12/25/2022 1009   LDLCALC 50 12/25/2022 1009   LDLCALC 192 (H) 10/23/2011 1142   LDLDIRECT 53.0 12/25/2022 1009     Risk Assessment/Calculations:          Physical Exam:    VS:  BP 118/64 (BP Location: Left Arm, Patient Position: Sitting, Cuff Size: Normal)   Pulse 61   Ht 6' (1.829 m)   Wt 206 lb 9.6 oz (93.7 kg)   SpO2 98%   BMI 28.02 kg/m     Wt Readings from Last 3 Encounters:  02/01/23 206 lb 9.6 oz (93.7 kg)  12/25/22 206 lb 12.8 oz (93.8 kg)  06/22/22 205 lb 3.2 oz (93.1 kg)     GEN:  Well nourished, well developed in no acute distress HEENT: Normal NECK: Left carotid bruit noted CARDIAC: RRR, no murmurs, rubs, gallops RESPIRATORY:  Clear to auscultation without rales, wheezing or rhonchi  ABDOMEN: Soft, non-tender, non-distended MUSCULOSKELETAL:  No edema; No deformity  SKIN: Warm and dry NEUROLOGIC:  Alert and oriented x 3 PSYCHIATRIC:  Normal affect   ASSESSMENT:    1. Precordial pain   2. Primary  hypertension   3. Pure hypercholesterolemia   4. Carotid stenosis, symptomatic w/o infarct, left    PLAN:    In order of problems listed above:  Chest pain, obtain Lexiscan Myoview to evaluate any perfusion abnormalities.  Appears noncardiac.  Previous echo with normal EF.  Right arm tingling appears neurologic from pressure.  Follow-up with PCP, consider neck imaging. Hypertension, BP controlled, continue losartan 100 mg daily, Norvasc 2.5 mg daily Hyperlipidemia, statin allergies, cholesterol now controlled.  Continue Praluent, Zetia 10 mg daily PAD, carotid stenosis s/p left carotid artery stent, 10/23, left CEA 2022.  Followed by vascular surgery, continue aspirin, Plavix, Praluent.  LDL at goal.    Follow-up yearly unless Myoview shows significant abnormalities..  Informed Consent   Shared Decision Making/Informed Consent The risks [chest pain, shortness of breath, cardiac arrhythmias, dizziness, blood pressure fluctuations, myocardial infarction, stroke/transient ischemic attack, nausea, vomiting, allergic reaction, radiation exposure, metallic taste sensation and life-threatening complications (estimated to be 1 in 10,000)], benefits (risk stratification, diagnosing coronary artery disease, treatment guidance) and alternatives of a nuclear stress test were discussed in detail with Mr. Askar and he agrees to proceed.         Medication Adjustments/Labs and Tests Ordered: Current medicines are reviewed at length with the patient today.  Concerns regarding medicines are outlined above.  Orders Placed This Encounter  Procedures   NM Myocar Multi W/Spect W/Wall Motion / EF   EKG 12-Lead   No orders of the defined types were placed in this encounter.    Patient Instructions  Medication Instructions:   Your physician recommends that you continue on your current medications as directed. Please refer to the Current Medication list given to you today.  *If you need a refill on your  cardiac medications before your next appointment, please call your pharmacy*   Lab Work:  None Ordered  If you have labs (blood work) drawn today and your tests are completely normal, you will receive your results only by: MyChart Message (if you have MyChart) OR A paper copy in the mail If you have any lab test that  is abnormal or we need to change your treatment, we will call you to review the results.   Testing/Procedures:  [facility] MYOVIEW  Your Provider has ordered a Stress Test with nuclear imaging. The purpose of this test is to evaluate the blood supply to your heart muscle. This procedure is referred to as a "Non-Invasive Stress Test." This is because other than having an IV started in your vein, nothing is inserted or "invades" your body. Cardiac stress tests are done to find areas of poor blood flow to the heart by determining the extent of coronary artery disease (CAD). Some patients exercise on a treadmill, which naturally increases the blood flow to your heart, while others who are unable to walk on a treadmill due to physical limitations have a pharmacologic/chemical stress agent called Lexiscan. This medicine will mimic walking on a treadmill by temporarily increasing your coronary blood flow.     REPORT TO [facility] MEDICAL MALL ENTRANCE  **Proceed to the 1st desk on the right, REGISTRATION, to check in**  Please note: this test may take anywhere between 2-4 hours to complete    Instructions regarding medication:   __XXX_:   You may take all of your regular morning medications the day of your test unless listed below.    How to prepare for your Myoview test:  Do not eat or drink for 6 hours prior to the test No caffeine for 24 hours prior to the test No smoking 24 hours prior to the test. Ladies, please do not wear dresses.  Skirts or pants are appropriate. Please wear a short sleeve shirt. No perfume, cologne or lotion. Wear comfortable walking shoes. No  heels!   PLEASE NOTIFY THE OFFICE AT LEAST 24 HOURS IN ADVANCE IF YOU ARE UNABLE TO KEEP YOUR APPOINTMENT.  [phone] AND  PLEASE NOTIFY NUCLEAR MEDICINE AT [facility] AT LEAST 24 HOURS IN ADVANCE IF YOU ARE UNABLE TO KEEP YOUR APPOINTMENT. [phone]       Follow-Up: At [facility], you and your health needs are our priority.  As part of our continuing mission to provide you with exceptional heart care, we have created designated Provider Care Teams.  These Care Teams include your primary Cardiologist (physician) and Advanced Practice Providers (APPs -  Physician Assistants and Nurse Practitioners) who all work together to provide you with the care you need, when you need it.  We recommend signing up for the patient portal called "MyChart".  Sign up information is provided on this After Visit Summary.  MyChart is used to connect with patients for Virtual Visits (Telemedicine).  Patients are able to view lab/test results, encounter notes, upcoming appointments, etc.  Non-urgent messages can be sent to your provider as well.   To learn more about what you can do with MyChart, go to [email or web address].    Your next appointment:   12 month(s)  Provider:   You may see Bobby Odea, MD or one of the following Advanced Practice Providers on your designated Care Team:   Nicolasa Ducking, NP Eula Listen, PA-C Cadence Fransico Michael, PA-C Charlsie Quest, NP Carlos Levering, NP   Signed, Bobby Odea, MD  02/01/2023 10:19 AM    [facility]

## 2023-02-08 ENCOUNTER — Encounter
Admission: RE | Admit: 2023-02-08 | Discharge: 2023-02-08 | Disposition: A | Discharge status: Home / Self Care | Payer: Medicare Other | Source: Ambulatory Visit | Attending: Cardiology | Admitting: Cardiology

## 2023-02-08 DIAGNOSIS — R072 Precordial pain: Secondary | ICD-10-CM

## 2023-02-08 LAB — NM MYOCAR MULTI W/SPECT W/WALL MOTION / EF
LV dias vol: 54 mL (ref 62–150)
LV sys vol: 19 mL
Nuc Stress EF: 65 %
Peak HR: 82 {beats}/min
Rest HR: 53 {beats}/min
Rest Nuclear Isotope Dose: 11 mCi
SDS: 0
SRS: 4
SSS: 1
ST Depression (mm): 0 mm
Stress Nuclear Isotope Dose: 30.7 mCi
TID: 0.7

## 2023-02-08 MED ORDER — TECHNETIUM TC 99M TETROFOSMIN IV KIT
30.6700 | PACK | Freq: Once | INTRAVENOUS | Status: AC | PRN
  Administered 2023-02-08: 30.67 via INTRAVENOUS

## 2023-02-08 MED ORDER — TECHNETIUM TC 99M TETROFOSMIN IV KIT
10.9900 | PACK | Freq: Once | INTRAVENOUS | Status: AC | PRN
  Administered 2023-02-08: 10.99 via INTRAVENOUS

## 2023-02-08 MED ORDER — REGADENOSON 0.4 MG/5ML IV SOLN
0.4000 mg | Freq: Once | INTRAVENOUS | Status: AC
  Administered 2023-02-08: 0.4 mg via INTRAVENOUS

## 2023-04-23 ENCOUNTER — Ambulatory Visit (INDEPENDENT_AMBULATORY_CARE_PROVIDER_SITE_OTHER): Payer: Medicare Other | Admitting: *Deleted

## 2023-04-23 VITALS — Ht 72.0 in | Wt 205.0 lb

## 2023-04-23 DIAGNOSIS — Z0000 Encounter for general adult medical examination without abnormal findings: Secondary | ICD-10-CM

## 2023-04-23 NOTE — Patient Instructions (Signed)
 Bobby Murillo , Thank you for taking time to come for your Medicare Wellness Visit. I appreciate your ongoing commitment to your health goals. Please review the following plan we discussed and let me know if I can assist you in the future.   Referrals/Orders/Follow-Ups/Clinician Recommendations: Remember to update your tetanus vaccine and consider the shingles vaccines.  This is a list of the screening recommended for you and due dates:  Health Maintenance  Topic Date Due   Zoster (Shingles) Vaccine (1 of 2) Never done   DTaP/Tdap/Td vaccine (2 - Td or Tdap) 10/16/2022   COVID-19 Vaccine (5 - 2024-25 season) 11/15/2022   Medicare Annual Wellness Visit  04/22/2024   Pneumonia Vaccine  Completed   Flu Shot  Completed   HPV Vaccine  Aged Out   Hepatitis C Screening  Discontinued    Advanced directives: (Copy Requested) Please bring a copy of your health care power of attorney and living will to the office to be added to your chart at your convenience.  Next Medicare Annual Wellness Visit scheduled for next year: Yes 04/28/24 @ 9:30

## 2023-04-23 NOTE — Progress Notes (Signed)
 Subjective:   Bobby Murillo is a 83 y.o. male who presents for Medicare Annual/Subsequent preventive examination.  Visit Complete: Virtual I connected with  Bobby Murillo on 04/23/23 by a audio enabled telemedicine application and verified that I am speaking with the correct person using two identifiers.This patient declined Interactive audio and acupuncturist. Therefore the visit was completed with audio only.   Patient Location: Home  Provider Location: Home Office  I discussed the limitations of evaluation and management by telemedicine. The patient expressed understanding and agreed to proceed.  Vital Signs: Because this visit was a virtual/telehealth visit, some criteria may be missing or patient reported. Any vitals not documented were not able to be obtained and vitals that have been documented are patient reported.  Cardiac Risk Factors include: advanced age (>33men, >80 women);dyslipidemia;male gender;hypertension     Objective:    Today's Vitals   04/23/23 1002  Weight: 205 lb (93 kg)  Height: 6' (1.829 m)   Body mass index is 27.8 kg/m.     04/23/2023   10:26 AM 07/20/2022    9:09 AM 04/17/2022   10:26 AM 12/30/2021    7:32 AM 06/03/2021   11:04 AM 04/09/2021    9:13 AM 02/22/2021    8:28 PM  Advanced Directives  Does Patient Have a Medical Advance Directive? Yes Yes Yes Yes Yes Yes Yes  Type of Estate Agent of [place];Living will Living will;Healthcare Power of [employer] Power of [place];Living will Living will;Healthcare Power of [employer] Power of [place];Living will Healthcare Power of [place];Living will  Does patient want to make changes to medical advance directive?  No - Patient declined No - Patient declined No - Guardian declined  No - Patient declined No - Patient declined  Copy of Healthcare Power of Attorney in Chart? No - copy requested No - copy requested No - copy requested No - copy requested   No - copy requested No - copy requested  Would patient like information on creating a medical advance directive?       No - Patient declined    Current Medications (verified) Outpatient Encounter Medications as of 04/23/2023  Medication Sig   acetaminophen  (TYLENOL ) 325 MG tablet Take 650 mg by mouth every 6 (six) hours as needed for moderate pain.   Alirocumab  (PRALUENT ) 150 MG/ML SOAJ Inject 1 mL (150 mg total) into the skin every 14 (fourteen) days.   ALPRAZolam  (XANAX ) 0.5 MG tablet TAKE 1 TABLET BY MOUTH AT BEDTIME AS NEEDED FOR SLEEP   amLODipine  (NORVASC ) 2.5 MG tablet Take 1 tablet (2.5 mg total) by mouth daily.   aspirin  EC 81 MG tablet Take 81 mg by mouth daily. Swallow whole.   clopidogrel  (PLAVIX ) 75 MG tablet Take 1 tablet (75 mg total) by mouth daily.   Collagen-Vitamin C-Biotin (COLLAGEN 1500/C PO) Take by mouth.   ezetimibe  (ZETIA ) 10 MG tablet Take 1 tablet (10 mg total) by mouth daily.   losartan  (COZAAR ) 100 MG tablet Take 1 tablet (100 mg total) by mouth daily.   polycarbophil (FIBERCON) 625 MG tablet Take 625 mg by mouth daily.   Glucosamine-Chondroitin (GLUCOSAMINE CHONDR COMPLEX PO) Take 1 tablet by mouth daily. tab by mouth daily (Patient not taking: Reported on 04/23/2023)   No facility-administered encounter medications on file as of 04/23/2023.    Allergies (verified) Alfuzosin and Pravastatin sodium   History: Past Medical History:  Diagnosis Date   Abnormal LFTs    Acute cerebrovascular accident (  CVA) due to thrombosis of left middle cerebral artery (HCC) 01/05/2021   Anxiety    Aortic atherosclerosis (HCC)    Arthritis    Basal cell carcinoma 09/14/2010   left calf    Bilateral carotid artery stenosis    a.) Carotid doppler 01/03/2021: 70-99% LICA; 0-49% RICA. b.) CTA neck 01/30/2021: critical stenosis LEFT carotid bulb.   Carotid stenosis, symptomatic, with infarction (HCC) 01/20/2021   CKD (chronic kidney disease), stage III (HCC)    History of kidney  stones    Hyperlipidemia    Hypertension    Long term current use of antithrombotics/antiplatelets    a.) DAPT therapy (ASA + clopidogrel )   Prostate cancer (HCC) 03/16/2008   Dr. Buell   Sinus bradycardia by electrocardiogram    Stroke (HCC) 01/03/2021   a.) Acute infarction affecting the cortex. ?? incidental punctate acute white matter infarction just posterior to the atrium of the left lateral ventricle. Stenosis of the right M2 branch inferior division. Severe atherosclerotic disease of both PCAs with severe stenoses.   Past Surgical History:  Procedure Laterality Date   CAROTID PTA/STENT INTERVENTION Left 12/30/2021   Procedure: CAROTID PTA/STENT INTERVENTION;  Surgeon: Jama Cordella MATSU, MD;  Location: [facility] INVASIVE CV LAB;  Service: Cardiovascular;  Laterality: Left;   CATARACT EXTRACTION Bilateral    05/2021   COLONOSCOPY     ENDARTERECTOMY Left 02/19/2021   Procedure: ENDARTERECTOMY CAROTID;  Surgeon: Jama Cordella MATSU, MD;  Location: [facility] ORS;  Service: Vascular;  Laterality: Left;   PROSTATECTOMY  11/14/2008   transurethral, radical, Dr. Charles   Family History  Problem Relation Age of Onset   Alzheimer's disease Mother    Stroke Father    Prostate cancer Father    Alzheimer's disease Sister    Stroke Paternal Aunt    Heart disease Paternal Uncle 3   Social History   Socioeconomic History   Marital status: Married    Spouse name: Rock   Number of children: Not on file   Years of education: Not on file   Highest education level: Not on file  Occupational History   Not on file  Tobacco Use   Smoking status: Never   Smokeless tobacco: Never  Vaping Use   Vaping status: Never Used  Substance and Sexual Activity   Alcohol use: Yes    Alcohol/week: 8.0 standard drinks of alcohol    Types: 4 Glasses of wine, 4 Shots of liquor per week    Comment: occasional   Drug use: No   Sexual activity: Yes  Other Topics Concern   Not on file  Social History Narrative    Patient lives in [place] with his wife.  Non-smoker.  Drinks over the weekend.  Retired from [employer].      Right handed   Social Drivers of Health   Financial Resource Strain: Low Risk  (04/23/2023)   Overall Financial Resource Strain (CARDIA)    Difficulty of Paying Living Expenses: Not hard at all  Food Insecurity: No Food Insecurity (04/23/2023)   Hunger Vital Sign    Worried About Running Out of Food in the Last Year: Never true    Ran Out of Food in the Last Year: Never true  Transportation Needs: No Transportation Needs (04/23/2023)   PRAPARE - Administrator, Civil Service (Medical): No    Lack of Transportation (Non-Medical): No  Physical Activity: Inactive (04/23/2023)   Exercise Vital Sign    Days of Exercise per Week: 0 days  Minutes of Exercise per Session: 0 min  Stress: No Stress Concern Present (04/23/2023)   [employer] of Occupational Health - Occupational Stress Questionnaire    Feeling of Stress : Only a little  Social Connections: Moderately Integrated (04/23/2023)   Social Connection and Isolation Panel [NHANES]    Frequency of Communication with Friends and Family: Twice a week    Frequency of Social Gatherings with Friends and Family: More than three times a week    Attends Religious Services: Never    Database Administrator or Organizations: Yes    Attends Engineer, Structural: More than 4 times per year    Marital Status: Married    Tobacco Counseling Counseling given: Not Answered   Clinical Intake:  Pre-visit preparation completed: Yes  Pain : No/denies pain     BMI - recorded: 27.8 Nutritional Status: BMI 25 -29 Overweight Nutritional Risks: None Diabetes: No  How often do you need to have someone help you when you read instructions, pamphlets, or other written materials from your doctor or pharmacy?: 1 - Never  Interpreter Needed?: No  Information entered by :: R. Eligha Kmetz LPN   Activities of Daily Living     04/23/2023   10:03 AM  In your present state of health, do you have any difficulty performing the following activities:  Hearing? 0  Vision? 0  Comment readers  Difficulty concentrating or making decisions? 1  Comment with  names  Walking or climbing stairs? 1  Dressing or bathing? 0  Doing errands, shopping? 0  Preparing Food and eating ? N  Using the Toilet? N  In the past six months, have you accidently leaked urine? N  Do you have problems with loss of bowel control? N  Managing your Medications? N  Managing your Finances? N  Housekeeping or managing your Housekeeping? N    Patient Care Team: Marylynn Verneita CROME, MD as PCP - General (Internal Medicine) Darliss Rogue, MD as PCP - Cardiology (Cardiology) Darliss Rogue, MD as Consulting Physician (Cardiology)  Indicate any recent Medical Services you may have received from other than [facility] providers in the past year (date may be approximate).     Assessment:   This is a routine wellness examination for Bonny.  Hearing/Vision screen Hearing Screening - Comments:: No issues Vision Screening - Comments:: readers   Goals Addressed             This Visit's Progress    Patient Stated       Wants to exercise more       Depression Screen    04/23/2023   10:18 AM 12/25/2022    8:55 AM 06/22/2022    9:01 AM 04/17/2022   10:27 AM 03/23/2022    8:44 AM 12/19/2021    3:39 PM 12/09/2021    2:38 PM  PHQ 2/9 Scores  PHQ - 2 Score 0 0 0 0 0 0 0  PHQ- 9 Score 0          Fall Risk    04/23/2023   10:07 AM 12/25/2022    8:55 AM 06/22/2022    9:01 AM 04/17/2022   10:27 AM 03/23/2022    8:44 AM  Fall Risk   Falls in the past year? 0 0 0 0 0  Number falls in past yr: 0 0 0 0   Injury with Fall? 0 0 0 0   Risk for fall due to : No Fall Risks No Fall Risks No Fall Risks  No Fall Risks  Follow up Falls prevention discussed;Falls evaluation completed Falls evaluation completed Falls evaluation completed  Falls evaluation completed     MEDICARE RISK AT HOME: Medicare Risk at Home Any stairs in or around the home?: Yes If so, are there any without handrails?: No Home free of loose throw rugs in walkways, pet beds, electrical cords, etc?: Yes Adequate lighting in your home to reduce risk of falls?: Yes Life alert?: No Use of a cane, walker or w/c?: No Grab bars in the bathroom?: No Shower chair or bench in shower?: Yes Elevated toilet seat or a handicapped toilet?: Yes   Cognitive Function:    04/09/2021    9:35 AM  MMSE - Mini Mental State Exam  Not completed: Unable to complete        04/23/2023   10:26 AM 04/17/2022   10:37 AM 04/08/2020    9:48 AM 04/06/2019    9:50 AM 04/04/2018    9:54 AM  6CIT Screen  What Year? 0 points 0 points 0 points 0 points 0 points  What month? 0 points 0 points 0 points 0 points 0 points  What time? 0 points 0 points 0 points 0 points 0 points  Count back from 20 0 points 0 points 0 points 0 points 0 points  Months in reverse 0 points 0 points 0 points 0 points 0 points  Repeat phrase 0 points 0 points 0 points 0 points 0 points  Total Score 0 points 0 points 0 points 0 points 0 points    Immunizations Immunization History  Administered Date(s) Administered   Fluad Quad(high Dose 65+) 12/15/2019, 12/23/2020, 12/19/2021   Fluad Trivalent(High Dose 65+) 12/25/2022   Influenza Split 02/13/2011   Influenza, High Dose Seasonal PF 12/30/2015, 01/07/2018   Influenza,inj,Quad PF,6+ Mos 11/08/2013, 11/30/2014, 03/29/2017   Influenza-Unspecified 12/30/2012   PFIZER(Purple Top)SARS-COV-2 Vaccination 03/20/2019, 04/10/2019, 02/13/2020, 09/30/2020   Pneumococcal Conjugate-13 11/08/2013   Pneumococcal Polysaccharide-23 02/13/2011, 02/07/2018   Tdap 10/15/2012    TDAP status: Up to date  Flu Vaccine status: Up to date  Pneumococcal vaccine status: Up to date  Covid-19 vaccine status: Declined, Education has been provided regarding the importance of this vaccine but patient  still declined. Advised may receive this vaccine at local pharmacy or Health Dept.or vaccine clinic. Aware to provide a copy of the vaccination record if obtained from local pharmacy or Health Dept. Verbalized acceptance and understanding.  Qualifies for Shingles Vaccine? Yes   Zostavax completed No   Shingrix Completed?: No.    Education has been provided regarding the importance of this vaccine. Patient has been advised to call insurance company to determine out of pocket expense if they have not yet received this vaccine. Advised may also receive vaccine at local pharmacy or Health Dept. Verbalized acceptance and understanding.  Screening Tests Health Maintenance  Topic Date Due   Zoster Vaccines- Shingrix (1 of 2) Never done   DTaP/Tdap/Td (2 - Td or Tdap) 10/16/2022   COVID-19 Vaccine (5 - 2024-25 season) 11/15/2022   Medicare Annual Wellness (AWV)  04/18/2023   Pneumonia Vaccine 85+ Years old  Completed   INFLUENZA VACCINE  Completed   HPV VACCINES  Aged Out   Hepatitis C Screening  Discontinued    Health Maintenance  Health Maintenance Due  Topic Date Due   Zoster Vaccines- Shingrix (1 of 2) Never done   DTaP/Tdap/Td (2 - Td or Tdap) 10/16/2022   COVID-19 Vaccine (5 - 2024-25 season) 11/15/2022   Medicare  Annual Wellness (AWV)  04/18/2023    Colorectal cancer screening: No longer required.   Lung Cancer Screening: (Low Dose CT Chest recommended if Age 13-80 years, 20 pack-year currently smoking OR have quit w/in 15years.) does not qualify.   Additional Screening:  Hepatitis C Screening: does qualify; Completed 04/2020  Vision Screening: Recommended annual ophthalmology exams for early detection of glaucoma and other disorders of the eye. Is the patient up to date with their annual eye exam?  Yes  Who is the provider or what is the name of the office in which the patient attends annual eye exams? [facility] If pt is not established with a provider, would they like to  be referred to a provider to establish care? No .   Dental Screening: Recommended annual dental exams for proper oral hygiene    Community Resource Referral / Chronic Care Management: CRR required this visit?  No   CCM required this visit?  No     Plan:     I have personally reviewed and noted the following in the patient's chart:   Medical and social history Use of alcohol, tobacco or illicit drugs  Current medications and supplements including opioid prescriptions. Patient is not currently taking opioid prescriptions. Functional ability and status Nutritional status Physical activity Advanced directives List of other physicians Hospitalizations, surgeries, and ER visits in previous 12 months Vitals Screenings to include cognitive, depression, and falls Referrals and appointments  In addition, I have reviewed and discussed with patient certain preventive protocols, quality metrics, and best practice recommendations. A written personalized care plan for preventive services as well as general preventive health recommendations were provided to patient.     Angeline Fredericks, LPN   09/14/7972   After Visit Summary: (MyChart) Due to this being a telephonic visit, the after visit summary with patients personalized plan was offered to patient via MyChart   Nurse Notes: None

## 2023-05-16 NOTE — Progress Notes (Unsigned)
 [name] : [id number]  Bobby Murillo is a 83 y.o. ([birth year]) male who presents with chief complaint of check carotid arteries.  History of Present Illness:   The patient is seen for follow up evaluation of carotid stenosis status post left carotid stent placement on 12/30/2021.     Procedure: Placement of a 8 mm by 6 mm x 30 mm exact stent with the use of the NAV-6 embolic protection device in the left carotid artery    There were no post operative problems or complications related to the surgery.  The patient denies neck or incisional pain.   The patient denies interval amaurosis fugax. There is no recent history of TIA symptoms or focal motor deficits. There is no prior documented CVA.   The patient denies headache.   The patient is taking enteric-coated aspirin 81 mg daily.   No recent shortening of the patient's walking distance or new symptoms consistent with claudication.  No history of rest pain symptoms. No new ulcers or wounds of the lower extremities have occurred.   There is no history of DVT, PE or superficial thrombophlebitis. No recent episodes of angina or shortness of breath documented.   No outpatient medications have been marked as taking for the 05/17/23 encounter (Appointment) with Gilda Crease, Latina Craver, MD.    Past Medical History:  Diagnosis Date   Abnormal LFTs    Acute cerebrovascular accident (CVA) due to thrombosis of left middle cerebral artery (HCC) 01/05/2021   Anxiety    Aortic atherosclerosis (HCC)    Arthritis    Basal cell carcinoma 09/14/2010   left calf    Bilateral carotid artery stenosis    a.) Carotid doppler 01/03/2021: 70-99% LICA; 0-49% RICA. b.) CTA neck 01/30/2021: critical stenosis LEFT carotid bulb.   Carotid stenosis, symptomatic, with infarction (HCC) 01/20/2021   CKD (chronic kidney disease), stage III (HCC)    History of kidney stones    Hyperlipidemia    Hypertension    Long term current use of  antithrombotics/antiplatelets    a.) DAPT therapy (ASA + clopidogrel)   Prostate cancer (HCC) 03/16/2008   Dr. Baxter Flattery   Sinus bradycardia by electrocardiogram    Stroke (HCC) 01/03/2021   a.) Acute infarction affecting the cortex. ?? incidental punctate acute white matter infarction just posterior to the atrium of the left lateral ventricle. Stenosis of the right M2 branch inferior division. Severe atherosclerotic disease of both PCAs with severe stenoses.    Past Surgical History:  Procedure Laterality Date   CAROTID PTA/STENT INTERVENTION Left 12/30/2021   Procedure: CAROTID PTA/STENT INTERVENTION;  Surgeon: Renford Dills, MD;  Location: [facility] INVASIVE CV LAB;  Service: Cardiovascular;  Laterality: Left;   CATARACT EXTRACTION Bilateral    05/2021   COLONOSCOPY     ENDARTERECTOMY Left 02/19/2021   Procedure: ENDARTERECTOMY CAROTID;  Surgeon: Renford Dills, MD;  Location: [facility] ORS;  Service: Vascular;  Laterality: Left;   PROSTATECTOMY  11/14/2008   transurethral, radical, Dr. Carin Primrose    Social History Social History   Tobacco Use   Smoking status: Never   Smokeless tobacco: Never  Vaping Use   Vaping status: Never Used  Substance Use Topics   Alcohol use: Yes    Alcohol/week: 8.0 standard drinks of alcohol    Types: 4 Glasses of wine, 4 Shots of liquor per week  Comment: occasional   Drug use: No    Family History Family History  Problem Relation Age of Onset   Alzheimer's disease Mother    Stroke Father    Prostate cancer Father    Alzheimer's disease Sister    Stroke Paternal Aunt    Heart disease Paternal Uncle 58    Allergies  Allergen Reactions   Alfuzosin Other (See Comments)    Other Reaction: fainting spell   Pravastatin Sodium     Other reaction(s): Muscle Pain     REVIEW OF SYSTEMS (Negative unless checked)  Constitutional: [] Weight loss  [] Fever  [] Chills Cardiac: [] Chest pain   [] Chest pressure   [] Palpitations   [] Shortness of breath  when laying flat   [] Shortness of breath with exertion. Vascular:  [x] Pain in legs with walking   [] Pain in legs at rest  [] History of DVT   [] Phlebitis   [] Swelling in legs   [] Varicose veins   [] Non-healing ulcers Pulmonary:   [] Uses home oxygen   [] Productive cough   [] Hemoptysis   [] Wheeze  [] COPD   [] Asthma Neurologic:  [] Dizziness   [] Seizures   [] History of stroke   [] History of TIA  [] Aphasia   [] Vissual changes   [] Weakness or numbness in arm   [] Weakness or numbness in leg Musculoskeletal:   [] Joint swelling   [] Joint pain   [] Low back pain Hematologic:  [] Easy bruising  [] Easy bleeding   [] Hypercoagulable state   [] Anemic Gastrointestinal:  [] Diarrhea   [] Vomiting  [] Gastroesophageal reflux/heartburn   [] Difficulty swallowing. Genitourinary:  [] Chronic kidney disease   [] Difficult urination  [] Frequent urination   [] Blood in urine Skin:  [] Rashes   [] Ulcers  Psychological:  [] History of anxiety   []  History of major depression.  Physical Examination  There were no vitals filed for this visit. There is no height or weight on file to calculate BMI. Gen: WD/WN, NAD Head: [place]/AT, No temporalis wasting.  Ear/Nose/Throat: Hearing grossly intact, nares w/o erythema or drainage Eyes: PER, EOMI, sclera nonicteric.  Neck: Supple, no masses.  No bruit or JVD.  Pulmonary:  Good air movement, no audible wheezing, no use of accessory muscles.  Cardiac: RRR, normal S1, S2, no Murmurs. Vascular:  carotid bruit noted Vessel Right Left  Radial Palpable Palpable  Carotid  Palpable  Palpable  Subclav  Palpable Palpable  Gastrointestinal: soft, non-distended. No guarding/no peritoneal signs.  Musculoskeletal: M/S 5/5 throughout.  No visible deformity.  Neurologic: CN 2-12 intact. Pain and light touch intact in extremities.  Symmetrical.  Speech is fluent. Motor exam as listed above. Psychiatric: Judgment intact, Mood & affect appropriate for pt's clinical situation. Dermatologic: No rashes or  ulcers noted.  No changes consistent with cellulitis.   CBC Lab Results  Component Value Date   WBC 5.9 12/25/2022   HGB 17.3 (H) 12/25/2022   HCT 51.2 12/25/2022   MCV 92.8 12/25/2022   PLT 197.0 12/25/2022    BMET    Component Value Date/Time   NA 139 12/25/2022 1009   NA 140 10/23/2011 1142   K 4.7 12/25/2022 1009   CL 106 12/25/2022 1009   CO2 24 12/25/2022 1009   GLUCOSE 90 12/25/2022 1009   BUN 18 12/25/2022 1009   BUN 20 10/23/2011 1142   CREATININE 1.19 12/25/2022 1009   CREATININE 1.26 (H) 12/22/2019 1508   CALCIUM 9.9 12/25/2022 1009   GFRNONAA 47 (L) 12/31/2021 0311   GFRAA 69 10/23/2011 1142   CrCl cannot be calculated (Patient's most recent lab  result is older than the maximum 21 days allowed.).  COAG Lab Results  Component Value Date   INR 1.0 02/22/2021   INR 1.0 01/03/2021    Radiology No results found.   Assessment/Plan There are no diagnoses linked to this encounter.   Levora Dredge, MD  05/16/2023 1:00 PM

## 2023-05-17 ENCOUNTER — Encounter (INDEPENDENT_AMBULATORY_CARE_PROVIDER_SITE_OTHER): Payer: Self-pay | Admitting: Vascular Surgery

## 2023-05-17 ENCOUNTER — Ambulatory Visit (INDEPENDENT_AMBULATORY_CARE_PROVIDER_SITE_OTHER): Payer: Medicare Other | Admitting: Vascular Surgery

## 2023-05-17 ENCOUNTER — Ambulatory Visit (INDEPENDENT_AMBULATORY_CARE_PROVIDER_SITE_OTHER): Payer: Medicare Other

## 2023-05-17 VITALS — BP 125/75 | HR 60 | Resp 16 | Wt 212.0 lb

## 2023-05-17 DIAGNOSIS — I10 Essential (primary) hypertension: Secondary | ICD-10-CM | POA: Diagnosis not present

## 2023-05-17 DIAGNOSIS — E785 Hyperlipidemia, unspecified: Secondary | ICD-10-CM

## 2023-05-17 DIAGNOSIS — I6522 Occlusion and stenosis of left carotid artery: Secondary | ICD-10-CM | POA: Diagnosis not present

## 2023-05-17 DIAGNOSIS — N1831 Chronic kidney disease, stage 3a: Secondary | ICD-10-CM

## 2023-05-17 DIAGNOSIS — I700 Atherosclerosis of aorta: Secondary | ICD-10-CM | POA: Diagnosis not present

## 2023-05-25 ENCOUNTER — Other Ambulatory Visit: Payer: Self-pay | Admitting: Internal Medicine

## 2023-05-25 DIAGNOSIS — F5102 Adjustment insomnia: Secondary | ICD-10-CM

## 2023-05-31 ENCOUNTER — Other Ambulatory Visit (HOSPITAL_COMMUNITY): Payer: Self-pay

## 2023-05-31 ENCOUNTER — Telehealth: Payer: Self-pay

## 2023-05-31 NOTE — Telephone Encounter (Signed)
 Patient needs PA for Alirocumab (PRALUENT) 150 MG/ML .

## 2023-05-31 NOTE — Telephone Encounter (Signed)
 Pharmacy Patient Advocate Encounter   Received notification from Pt Calls Messages that prior authorization for Praluent 150 mg is required/requested.   Insurance verification completed.   The patient is insured through [facility] .   Per test claim: PA required; PA submitted to above mentioned insurance via CoverMyMeds Key/confirmation #/EOC [id number] Status is pending

## 2023-05-31 NOTE — Telephone Encounter (Signed)
 noted

## 2023-06-01 ENCOUNTER — Telehealth: Payer: Self-pay

## 2023-06-01 ENCOUNTER — Other Ambulatory Visit (HOSPITAL_COMMUNITY): Payer: Self-pay

## 2023-06-01 NOTE — Telephone Encounter (Signed)
 Pharmacy Patient Advocate Encounter  Received notification from [facility] that Prior Authorization for Praluent 150 has been DENIED.  Full denial letter will be uploaded to the media tab. See denial reason below.   PA #/Case ID/Reference #: [id number]

## 2023-06-02 MED ORDER — REPATHA SURECLICK 140 MG/ML ~~LOC~~ SOAJ: 140.0000 mg | SUBCUTANEOUS | 11 refills | Status: AC

## 2023-06-02 NOTE — Telephone Encounter (Signed)
 Pt is aware.

## 2023-06-02 NOTE — Telephone Encounter (Signed)
 Repatha sent to pharmacy

## 2023-06-02 NOTE — Addendum Note (Signed)
 Addended by: Sherlene Shams on: 06/02/2023 04:50 PM   Modules accepted: Orders

## 2023-06-02 NOTE — Telephone Encounter (Signed)
 Spoke with pt and he stated that he has not ever tried the [employer]

## 2023-06-14 ENCOUNTER — Telehealth: Payer: Self-pay

## 2023-06-14 NOTE — Telephone Encounter (Signed)
error 

## 2023-06-25 ENCOUNTER — Encounter: Payer: Self-pay | Admitting: Internal Medicine

## 2023-06-25 ENCOUNTER — Ambulatory Visit (INDEPENDENT_AMBULATORY_CARE_PROVIDER_SITE_OTHER): Payer: Medicare Other | Admitting: Internal Medicine

## 2023-06-25 VITALS — BP 103/66 | HR 43 | Ht 72.0 in | Wt 210.6 lb

## 2023-06-25 DIAGNOSIS — E785 Hyperlipidemia, unspecified: Secondary | ICD-10-CM

## 2023-06-25 DIAGNOSIS — I6523 Occlusion and stenosis of bilateral carotid arteries: Secondary | ICD-10-CM | POA: Diagnosis not present

## 2023-06-25 DIAGNOSIS — M170 Bilateral primary osteoarthritis of knee: Secondary | ICD-10-CM | POA: Diagnosis not present

## 2023-06-25 DIAGNOSIS — I10 Essential (primary) hypertension: Secondary | ICD-10-CM | POA: Diagnosis not present

## 2023-06-25 DIAGNOSIS — N1831 Chronic kidney disease, stage 3a: Secondary | ICD-10-CM

## 2023-06-25 DIAGNOSIS — M2550 Pain in unspecified joint: Secondary | ICD-10-CM

## 2023-06-25 DIAGNOSIS — R419 Unspecified symptoms and signs involving cognitive functions and awareness: Secondary | ICD-10-CM

## 2023-06-25 DIAGNOSIS — I69311 Memory deficit following cerebral infarction: Secondary | ICD-10-CM | POA: Diagnosis not present

## 2023-06-25 LAB — COMPREHENSIVE METABOLIC PANEL WITH GFR
ALT: 27 U/L (ref 0–53)
AST: 24 U/L (ref 0–37)
Albumin: 4.3 g/dL (ref 3.5–5.2)
Alkaline Phosphatase: 82 U/L (ref 39–117)
BUN: 22 mg/dL (ref 6–23)
CO2: 26 meq/L (ref 19–32)
Calcium: 9.5 mg/dL (ref 8.4–10.5)
Chloride: 107 meq/L (ref 96–112)
Creatinine, Ser: 1.26 mg/dL (ref 0.40–1.50)
GFR: 53.67 mL/min — ABNORMAL LOW (ref >60.00)
Glucose, Bld: 98 mg/dL (ref 70–99)
Potassium: 4.5 meq/L (ref 3.5–5.1)
Sodium: 140 meq/L (ref 135–145)
Total Bilirubin: 1.8 mg/dL — ABNORMAL HIGH (ref 0.2–1.2)
Total Protein: 6.8 g/dL (ref 6.0–8.3)

## 2023-06-25 LAB — LIPID PANEL
Cholesterol: 116 mg/dL (ref 0–200)
HDL: 56.5 mg/dL (ref >39.00)
LDL Cholesterol: 35 mg/dL (ref 0–99)
NonHDL: 59.06
Total CHOL/HDL Ratio: 2
Triglycerides: 121 mg/dL (ref 0.0–149.0)
VLDL: 24.2 mg/dL (ref 0.0–40.0)

## 2023-06-25 LAB — MICROALBUMIN / CREATININE URINE RATIO
Creatinine,U: 131.6 mg/dL
Microalb Creat Ratio: UNDETERMINED mg/g (ref 0.0–30.0)
Microalb, Ur: <0.7 mg/dL

## 2023-06-25 LAB — LDL CHOLESTEROL, DIRECT: Direct LDL: 45 mg/dL

## 2023-06-25 MED ORDER — AMLODIPINE BESYLATE 2.5 MG PO TABS: 2.5000 mg | ORAL_TABLET | Freq: Every day | ORAL | 1 refills | Status: DC

## 2023-06-25 NOTE — Patient Instructions (Addendum)
 YOU DO NOT HAVE ALZHEIMER'S OR ANY OTHER FORM OF DEMENTIA,!   BUT I highly recommend reading the following book because the author advocates being PROACTIVE with lifestyle changes:  "the End of Alzheimer's: The First Program to Prevent and Reverse Cognitive Decline"  by Dorice Lamas, MD    Consider taking turmeric supplements for arthritis pain . They are safe and do not harm kidneys,  stomach or blood pressure control

## 2023-06-25 NOTE — Progress Notes (Signed)
 Subjective:  Patient ID: Bobby Murillo, male    DOB: [birth year]  Age: 83 y.o. [name]: [id number]  CC: The primary encounter diagnosis was Essential hypertension. 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   HPI Bobby Murillo presents for  Chief Complaint  Patient presents with   Medical Management of Chronic Issues    6 month follow up    1) joint pain involving feet , has a heel spur on the left . ALSO HAVING KNEE PAIN ,  WITH WALKING .NO LEG PAIN.Aaron Aas   Recently bought ome inserts at [employer]   2) Hypertension: patient checks blood pressure twice weekly at home.  Readings have been for the most part <130/80 at rest . Patient is following a reduced salt diet most days and is taking medications as prescribed (losartan, amlodipine)  3) HLD ; h/o statin myalgia,  taking zetia and Repatha.  Has had one  dose since resuming  12-13 days ago.   4) post CVA cognitive concerns: trouble limited to remembering people's names    Outpatient Medications Prior to Visit  Medication Sig Dispense Refill   acetaminophen (TYLENOL) 325 MG tablet Take 650 mg by mouth every 6 (six) hours as needed for moderate pain.     ALPRAZolam (XANAX) 0.5 MG tablet TAKE 1 TABLET BY MOUTH AT BEDTIME AS NEEDED FOR SLEEP 30 tablet 5   aspirin EC 81 MG tablet Take 81 mg by mouth daily. Swallow whole.     clopidogrel (PLAVIX) 75 MG tablet Take 1 tablet (75 mg total) by mouth daily. 30 tablet 11   Collagen-Vitamin C-Biotin (COLLAGEN 1500/C PO) Take by mouth.     Evolocumab (REPATHA SURECLICK) 140 MG/ML SOAJ Inject 140 mg into the skin every 14 (fourteen) days. 2 mL 11   ezetimibe (ZETIA) 10 MG tablet Take 1 tablet (10 mg total) by mouth daily. 90 tablet 3    Glucosamine-Chondroitin (GLUCOSAMINE CHONDR COMPLEX PO) Take 1 tablet by mouth daily. tab by mouth daily     losartan (COZAAR) 100 MG tablet Take 1 tablet (100 mg total) by mouth daily. 90 tablet 2   polycarbophil (FIBERCON) 625 MG tablet Take 625 mg by mouth daily.     amLODipine (NORVASC) 2.5 MG tablet Take 1 tablet (2.5 mg total) by mouth daily. 90 tablet 1   No facility-administered medications prior to visit.    Review of Systems;  Patient denies headache, fevers, malaise, unintentional weight loss, skin rash, eye pain, sinus congestion and sinus pain, sore throat, dysphagia,  hemoptysis , cough, dyspnea, wheezing, chest pain, palpitations, orthopnea, edema, abdominal pain, nausea, melena, diarrhea, constipation, flank pain, dysuria, hematuria, urinary  Frequency, nocturia, numbness, tingling, seizures,  Focal weakness, Loss of consciousness,  Tremor, insomnia, depression, anxiety, and suicidal ideation.      Objective:  BP 103/66   Pulse (!) 43   Ht 6' (1.829 m)   Wt 210 lb 9.6 oz (95.5 kg)   SpO2 95%   BMI 28.56 kg/m   BP Readings from Last 3 Encounters:  06/25/23 103/66  05/17/23 125/75  02/01/23 118/64    Wt Readings from Last 3 Encounters:  06/25/23 210 lb 9.6 oz (95.5 kg)  05/17/23 212 lb (96.2 kg)  04/23/23 205 lb (93 kg)    Physical Exam Vitals  reviewed.  Constitutional:      General: He is not in acute distress.    Appearance: Normal appearance. He is normal weight. He is not ill-appearing, toxic-appearing or diaphoretic.  HENT:     Head: Normocephalic.  Eyes:     General: No scleral icterus.       Right eye: No discharge.        Left eye: No discharge.     Conjunctiva/sclera: Conjunctivae normal.  Cardiovascular:     Rate and Rhythm: Normal rate and regular rhythm.     Heart sounds: Normal heart sounds.  Pulmonary:     Effort: Pulmonary effort is normal. No respiratory distress.     Breath sounds: Normal breath sounds.  Musculoskeletal:         General: Normal range of motion.     Cervical back: Normal range of motion.  Skin:    General: Skin is warm and dry.  Neurological:     General: No focal deficit present.     Mental Status: He is alert and oriented to person, place, and time. Mental status is at baseline.  Psychiatric:        Mood and Affect: Mood normal.        Behavior: Behavior normal.        Thought Content: Thought content normal.        Judgment: Judgment normal.    Lab Results  Component Value Date   HGBA1C 5.9 12/25/2022   HGBA1C 6.2 05/27/2021   HGBA1C 6.0 (H) 01/03/2021    Lab Results  Component Value Date   CREATININE 1.26 06/25/2023   CREATININE 1.19 12/25/2022   CREATININE 1.30 06/19/2022    Lab Results  Component Value Date   WBC 5.9 12/25/2022   HGB 17.3 (H) 12/25/2022   HCT 51.2 12/25/2022   PLT 197.0 12/25/2022   GLUCOSE 98 06/25/2023   CHOL 116 06/25/2023   TRIG 121.0 06/25/2023   HDL 56.50 06/25/2023   LDLDIRECT 45.0 06/25/2023   LDLCALC 35 06/25/2023   ALT 27 06/25/2023   AST 24 06/25/2023   NA 140 06/25/2023   K 4.5 06/25/2023   CL 107 06/25/2023   CREATININE 1.26 06/25/2023   BUN 22 06/25/2023   CO2 26 06/25/2023   TSH 3.22 12/25/2022   PSA 0.00 (L) 05/27/2021   INR 1.0 02/22/2021   HGBA1C 5.9 12/25/2022   MICROALBUR <0.7 06/25/2023    NM Myocar Multi W/Spect W/Wall Motion / EF Result Date: 02/08/2023   The study is normal. The study is low risk.   No ST deviation was noted.   LV perfusion is normal. There is no evidence of ischemia. There is no evidence of infarction.   Left ventricular function is normal. Nuclear stress EF: 65%. End diastolic cavity size is normal. End systolic cavity size is normal.   CT attenuation images shows mild aortic and coronary calcifications.    Assessment & Plan:  .Essential hypertension Assessment & Plan: Well controlled on current regimen of losartan 100 mg daily and amlodipine 2.5 mg daily .   Renal function stable, no changes  today.   Orders: -     Comprehensive metabolic panel with GFR -     Microalbumin / creatinine urine ratio  Hyperlipidemia LDL goal <100 -     Lipid panel -     LDL cholesterol, direct  Stage 3a chronic kidney disease (HCC) Assessment & Plan: No prior nephrology evaluation.  GFR is stable at 57 m/min.  He  is avoiding NSAIDs.   Lab Results  Component Value Date   CREATININE 1.26 06/25/2023   Lab Results  Component Value Date   NA 140 06/25/2023   K 4.5 06/25/2023   CL 107 06/25/2023   CO2 26 06/25/2023      Polyarthralgia Assessment & Plan: He has no signs of systemic disease,  Only OA and DJD.  Miserable since stopping NSAIDs.   Advised to try turmeric and continue topical voltaren not more than every other day .   Memory deficit after cerebral infarction Assessment & Plan: Brought up today during CPE.  Needs to return for evaluation    Stenosis of both internal carotid arteries Assessment & Plan: S/p left CEA.  Recent Carotd dopplers noted < 49% stenosis T .  Continue asa 325 mg , statin 2 times weekly,  zetia and praluent    Primary osteoarthritis of both knees Assessment & Plan: He has severe OA with tricompartmental degenerative changes.    Cognitive complaints with normal neuropsychological exam Assessment & Plan: Reassurance provided that he has no signs of AD., but  I highly recommend he and wife read "the End of Alzheimer's: The First Program to Prevent and Reverse Cognitive Decline"  by Fonnie Iba, MD     Other orders -     amLODIPine Besylate; Take 1 tablet (2.5 mg total) by mouth daily.  Dispense: 90 tablet; Refill: 1     Follow-up: Return in about 6 months (around 12/25/2023) for physical.   Thersia Flax, MD

## 2023-06-26 NOTE — Assessment & Plan Note (Addendum)
 S/p left CEA.  Recent Carotd dopplers noted < 49% stenosis T .  Continue asa 325 mg , statin 2 times weekly,  zetia and praluent

## 2023-06-26 NOTE — Assessment & Plan Note (Signed)
 He has no signs of systemic disease,  Only OA and DJD.  Miserable since stopping NSAIDs.   Advised to try turmeric and continue topical voltaren not more than every other day .

## 2023-06-26 NOTE — Assessment & Plan Note (Signed)
Brought up today during CPE.  Needs to return for evaluation

## 2023-06-26 NOTE — Assessment & Plan Note (Addendum)
 No prior nephrology evaluation.  GFR is stable at 57 m/min.  He is avoiding NSAIDs.   Lab Results  Component Value Date   CREATININE 1.26 06/25/2023   Lab Results  Component Value Date   NA 140 06/25/2023   K 4.5 06/25/2023   CL 107 06/25/2023   CO2 26 06/25/2023

## 2023-06-26 NOTE — Assessment & Plan Note (Addendum)
 Well controlled on current regimen of losartan 100 mg daily and amlodipine 2.5 mg daily .   Renal function stable, no changes today.

## 2023-06-26 NOTE — Assessment & Plan Note (Signed)
He has severe OA with tricompartmental degenerative changes.

## 2023-06-26 NOTE — Assessment & Plan Note (Signed)
 Reassurance provided that he has no signs of AD., but  I highly recommend he and wife read "the End of Alzheimer's: The First Program to Prevent and Reverse Cognitive Decline"  by Fonnie Iba, MD

## 2023-06-27 ENCOUNTER — Encounter: Payer: Self-pay | Admitting: Internal Medicine

## 2023-06-27 NOTE — Addendum Note (Signed)
 Addended by: Thersia Flax on: 06/27/2023 09:27 PM   Modules accepted: Orders

## 2023-07-09 ENCOUNTER — Ambulatory Visit
Admission: RE | Admit: 2023-07-09 | Discharge: 2023-07-09 | Disposition: A | Discharge status: Home / Self Care | Source: Ambulatory Visit | Attending: Internal Medicine | Admitting: Internal Medicine

## 2023-07-09 DIAGNOSIS — R17 Unspecified jaundice: Secondary | ICD-10-CM | POA: Diagnosis not present

## 2023-07-11 ENCOUNTER — Encounter: Payer: Self-pay | Admitting: Internal Medicine

## 2023-07-13 ENCOUNTER — Other Ambulatory Visit: Payer: Self-pay | Admitting: Internal Medicine

## 2023-08-03 ENCOUNTER — Encounter (INDEPENDENT_AMBULATORY_CARE_PROVIDER_SITE_OTHER): Payer: Self-pay

## 2023-08-27 ENCOUNTER — Ambulatory Visit (INDEPENDENT_AMBULATORY_CARE_PROVIDER_SITE_OTHER): Admitting: Internal Medicine

## 2023-08-27 ENCOUNTER — Encounter: Payer: Self-pay | Admitting: Internal Medicine

## 2023-08-27 VITALS — BP 136/74 | HR 62 | Ht 72.0 in | Wt 210.6 lb

## 2023-08-27 DIAGNOSIS — M25561 Pain in right knee: Secondary | ICD-10-CM | POA: Diagnosis not present

## 2023-08-27 DIAGNOSIS — I6930 Unspecified sequelae of cerebral infarction: Secondary | ICD-10-CM | POA: Diagnosis not present

## 2023-08-27 DIAGNOSIS — K760 Fatty (change of) liver, not elsewhere classified: Secondary | ICD-10-CM | POA: Diagnosis not present

## 2023-08-27 DIAGNOSIS — G8929 Other chronic pain: Secondary | ICD-10-CM | POA: Diagnosis not present

## 2023-08-27 DIAGNOSIS — M170 Bilateral primary osteoarthritis of knee: Secondary | ICD-10-CM | POA: Diagnosis not present

## 2023-08-27 DIAGNOSIS — E785 Hyperlipidemia, unspecified: Secondary | ICD-10-CM | POA: Diagnosis not present

## 2023-08-27 DIAGNOSIS — I10 Essential (primary) hypertension: Secondary | ICD-10-CM

## 2023-08-27 DIAGNOSIS — M25562 Pain in left knee: Secondary | ICD-10-CM | POA: Diagnosis not present

## 2023-08-27 MED ORDER — LOSARTAN POTASSIUM 100 MG PO TABS: 100.0000 mg | ORAL_TABLET | Freq: Every day | ORAL | 3 refills | Status: AC

## 2023-08-27 MED ORDER — EZETIMIBE 10 MG PO TABS: 10.0000 mg | ORAL_TABLET | Freq: Every day | ORAL | 3 refills | Status: AC

## 2023-08-27 NOTE — Progress Notes (Unsigned)
 Subjective:  Patient ID: Bobby Murillo, male    DOB: 26-Jul-1940  Age: 83 y.o. [name]: [id number]  CC: The primary encounter diagnosis was Chronic pain of both knees. Diagnoses of Essential hypertension, Bilirubinemia, Primary osteoarthritis of both knees, History of CVA with residual deficit, Hyperlipidemia LDL goal <100, and Hepatic steatosis were also pertinent to this visit.   HPI Bobby Murillo presents for  Chief Complaint  Patient presents with   discuss US  results   FOLLOW UP ON RESULTS OF   RECENT  ULTRASOUND OF LIVER DONE TO EVALUATE THE ETIOLOGY OF ELEVATED T BILI    1) Fatty liver suggested by ultrasound.   2) bilateral knee pain: he has been searching for non surgical therapies on internet:  has questions about  things he has read. Denis pain unless he is active . Has not had steroid injections in years , and not by an orthopedist (only by me) . Wants referral to wife [name]'s [facility])  3) persistent loss of balance since his CVA :  balance  improved transiently to a small degree after receiving  PT but didn't that it helped enough to repeat it.  Has not fallen but nearly did reaching down to pick up a golf ball      Outpatient Medications Prior to Visit  Medication Sig Dispense Refill   acetaminophen  (TYLENOL ) 325 MG tablet Take 650 mg by mouth every 6 (six) hours as needed for moderate pain.     ALPRAZolam  (XANAX ) 0.5 MG tablet TAKE 1 TABLET BY MOUTH AT BEDTIME AS NEEDED FOR SLEEP 30 tablet 5   amLODipine  (NORVASC ) 2.5 MG tablet Take 1 tablet (2.5 mg total) by mouth daily. 90 tablet 1   aspirin  EC 81 MG tablet Take 81 mg by mouth daily. Swallow whole.     clopidogrel  (PLAVIX ) 75 MG tablet Take 1 tablet (75 mg total) by mouth daily. 30 tablet 11   Evolocumab  (REPATHA  SURECLICK) 140 MG/ML SOAJ Inject 140 mg into the skin every 14 (fourteen) days. 2 mL 11   polycarbophil (FIBERCON) 625 MG tablet Take 625 mg by mouth daily.     ezetimibe  (ZETIA ) 10 MG tablet Take 1 tablet by  mouth once daily 90 tablet 0   losartan  (COZAAR ) 100 MG tablet Take 1 tablet (100 mg total) by mouth daily. 90 tablet 2   Collagen-Vitamin C-Biotin (COLLAGEN 1500/C PO) Take by mouth. (Patient not taking: Reported on 08/27/2023)     Glucosamine-Chondroitin (GLUCOSAMINE CHONDR COMPLEX PO) Take 1 tablet by mouth daily. tab by mouth daily (Patient not taking: Reported on 08/27/2023)     No facility-administered medications prior to visit.    Review of Systems;  Patient denies headache, fevers, malaise, unintentional weight loss, skin rash, eye pain, sinus congestion and sinus pain, sore throat, dysphagia,  hemoptysis , cough, dyspnea, wheezing, chest pain, palpitations, orthopnea, edema, abdominal pain, nausea, melena, diarrhea, constipation, flank pain, dysuria, hematuria, urinary  Frequency, nocturia, numbness, tingling, seizures,  Focal weakness, Loss of consciousness,  Tremor, insomnia, depression, anxiety, and suicidal ideation.      Objective:  BP 136/74   Pulse 62   Ht 6' (1.829 m)   Wt 210 lb 9.6 oz (95.5 kg)   SpO2 98%   BMI 28.56 kg/m   BP Readings from Last 3 Encounters:  08/27/23 136/74  06/25/23 103/66  05/17/23 125/75    Wt Readings from Last 3 Encounters:  08/27/23 210 lb 9.6 oz (95.5 kg)  06/25/23 210 lb 9.6 oz (95.5  kg)  05/17/23 212 lb (96.2 kg)    Physical Exam Vitals reviewed.  Constitutional:      General: He is not in acute distress.    Appearance: Normal appearance. He is normal weight. He is not ill-appearing, toxic-appearing or diaphoretic.  HENT:     Head: Normocephalic.   Eyes:     General: No scleral icterus.       Right eye: No discharge.        Left eye: No discharge.     Conjunctiva/sclera: Conjunctivae normal.    Cardiovascular:     Rate and Rhythm: Normal rate and regular rhythm.     Heart sounds: Normal heart sounds.  Pulmonary:     Effort: Pulmonary effort is normal. No respiratory distress.     Breath sounds: Normal breath sounds.    Musculoskeletal:        General: Normal range of motion.     Cervical back: Normal range of motion.   Skin:    General: Skin is warm and dry.   Neurological:     General: No focal deficit present.     Mental Status: He is alert and oriented to person, place, and time. Mental status is at baseline.     Cranial Nerves: No cranial nerve deficit.     Sensory: No sensory deficit.     Motor: No weakness.     Coordination: Coordination normal.     Gait: Gait normal.   Psychiatric:        Mood and Affect: Mood normal.        Behavior: Behavior normal.        Thought Content: Thought content normal.        Judgment: Judgment normal.    Lab Results  Component Value Date   HGBA1C 5.9 12/25/2022   HGBA1C 6.2 05/27/2021   HGBA1C 6.0 (H) 01/03/2021    Lab Results  Component Value Date   CREATININE 1.26 06/25/2023   CREATININE 1.19 12/25/2022   CREATININE 1.30 06/19/2022    Lab Results  Component Value Date   WBC 5.9 12/25/2022   HGB 17.3 (H) 12/25/2022   HCT 51.2 12/25/2022   PLT 197.0 12/25/2022   GLUCOSE 98 06/25/2023   CHOL 116 06/25/2023   TRIG 121.0 06/25/2023   HDL 56.50 06/25/2023   LDLDIRECT 45.0 06/25/2023   LDLCALC 35 06/25/2023   ALT 27 06/25/2023   AST 24 06/25/2023   NA 140 06/25/2023   K 4.5 06/25/2023   CL 107 06/25/2023   CREATININE 1.26 06/25/2023   BUN 22 06/25/2023   CO2 26 06/25/2023   TSH 3.22 12/25/2022   PSA 0.00 (L) 05/27/2021   INR 1.0 02/22/2021   HGBA1C 5.9 12/25/2022   MICROALBUR <0.7 06/25/2023    US  Abdomen Limited RUQ (LIVER/GB) Result Date: 07/09/2023 CLINICAL DATA:  Persistent elevation of bilirubin EXAM: ULTRASOUND ABDOMEN LIMITED RIGHT UPPER QUADRANT COMPARISON:  CT abdomen and pelvis December 16, 2021 FINDINGS: Gallbladder: No gallstones or wall thickening visualized. No sonographic Murphy sign noted by sonographer. Common bile duct: Diameter: 4.1 mm Liver: No focal lesion. Increased echotexture. Portal vein is patent on color  Doppler imaging with normal direction of blood flow towards the liver. Other: None. IMPRESSION: 1. No acute abnormality identified. 2. Increased echotexture of the liver. This is a nonspecific finding but can be seen in fatty infiltration of liver. Electronically Signed   By: Anna Barnes M.D.   On: 07/09/2023 15:08    Assessment & Plan:  .  Chronic pain of both knees -     Ambulatory referral to Orthopedic Surgery  Essential hypertension Assessment & Plan: Well controlled on current regimen of losartan  100 mg daily and amlodipine  2.5 mg daily .   Renal function stable, no changes today.   Orders: -     Losartan  Potassium; Take 1 tablet (100 mg total) by mouth daily.  Dispense: 90 tablet; Refill: 3  Bilirubinemia Assessment & Plan: Workup non diagnostic except for hepatic steatosis    Primary osteoarthritis of both knees Assessment & Plan: He has severe OA with tricompartmental degenerative changes.  Referring to Kevin Krasinski .    History of CVA with residual deficit Assessment & Plan: His balance has been affected by his CVA but he has not fallen.    Hyperlipidemia LDL goal <100 Assessment & Plan: MANAGED WITH PRALUENT  AND ZETIA  . LD is at goal   Lab Results  Component Value Date   CHOL 116 06/25/2023   HDL 56.50 06/25/2023   LDLCALC 35 06/25/2023   LDLDIRECT 45.0 06/25/2023   TRIG 121.0 06/25/2023   CHOLHDL 2 06/25/2023      Hepatic steatosis Assessment & Plan: Suggested by ultrasound of liver and negative virology /autoimmune screening .  Diagnosis discussed,  advise to restrict us  of NSAIDS to prn  and restrict alcohol to one drink every other night   Other orders -     Ezetimibe ; Take 1 tablet (10 mg total) by mouth daily.  Dispense: 90 tablet; Refill: 3     I spent 30 minutes on the day of this face to face encounter reviewing patient's  most recent visit liver ultrasound,  plain films of knees,  prior relevant surgical and non surgical procedures,  recent  labs and imaging studies, counseling on fatty  liver management,  reviewing the assessment and plan with patient, and post visit ordering and reviewing of  diagnostics and therapeutics with patient  .   Follow-up: Return in about 6 months (around 02/26/2024).   Thersia Flax, MD

## 2023-08-27 NOTE — Patient Instructions (Addendum)
 MEDICATIONS TO AVOID AND USE IN FATTY LIVER:  AVOID NSAIDS (IBUPROFEN , ALEVE ,  MELOXICAM AND DICLOFENAC) as they can harm liver    You CAN  take  up to 3000 mg of acetominophen (tylenol ) every day safely  In divided doses (1000 mg every 8 hours if needed ) Drinking salt water is NOT advised.  Soaking in  salt water IS helpful

## 2023-08-29 DIAGNOSIS — K760 Fatty (change of) liver, not elsewhere classified: Secondary | ICD-10-CM | POA: Insufficient documentation

## 2023-08-29 NOTE — Assessment & Plan Note (Signed)
 His balance has been affected by his CVA but he has not fallen.

## 2023-08-29 NOTE — Assessment & Plan Note (Signed)
 Workup non diagnostic except for hepatic steatosis

## 2023-08-29 NOTE — Assessment & Plan Note (Signed)
 MANAGED WITH PRALUENT  AND ZETIA  . LD is at goal   Lab Results  Component Value Date   CHOL 116 06/25/2023   HDL 56.50 06/25/2023   LDLCALC 35 06/25/2023   LDLDIRECT 45.0 06/25/2023   TRIG 121.0 06/25/2023   CHOLHDL 2 06/25/2023

## 2023-08-29 NOTE — Assessment & Plan Note (Signed)
 He has severe OA with tricompartmental degenerative changes.  Referring to Kevin Krasinski .

## 2023-08-29 NOTE — Assessment & Plan Note (Addendum)
 Suggested by ultrasound of liver and negative virology /autoimmune screening .  Diagnosis discussed,  advise to restrict us  of NSAIDS to prn  and restrict alcohol to one drink every other night

## 2023-08-29 NOTE — Assessment & Plan Note (Signed)
 Well controlled on current regimen of losartan 100 mg daily and amlodipine 2.5 mg daily .   Renal function stable, no changes today.

## 2023-09-03 DIAGNOSIS — M170 Bilateral primary osteoarthritis of knee: Secondary | ICD-10-CM | POA: Diagnosis not present

## 2023-09-20 DIAGNOSIS — Z85828 Personal history of other malignant neoplasm of skin: Secondary | ICD-10-CM | POA: Diagnosis not present

## 2023-09-20 DIAGNOSIS — L570 Actinic keratosis: Secondary | ICD-10-CM | POA: Diagnosis not present

## 2023-09-20 DIAGNOSIS — D2262 Melanocytic nevi of left upper limb, including shoulder: Secondary | ICD-10-CM | POA: Diagnosis not present

## 2023-09-20 DIAGNOSIS — D2271 Melanocytic nevi of right lower limb, including hip: Secondary | ICD-10-CM | POA: Diagnosis not present

## 2023-09-20 DIAGNOSIS — Z08 Encounter for follow-up examination after completed treatment for malignant neoplasm: Secondary | ICD-10-CM | POA: Diagnosis not present

## 2023-09-20 DIAGNOSIS — D2261 Melanocytic nevi of right upper limb, including shoulder: Secondary | ICD-10-CM | POA: Diagnosis not present

## 2023-09-20 DIAGNOSIS — D2272 Melanocytic nevi of left lower limb, including hip: Secondary | ICD-10-CM | POA: Diagnosis not present

## 2023-09-20 DIAGNOSIS — D225 Melanocytic nevi of trunk: Secondary | ICD-10-CM | POA: Diagnosis not present

## 2023-09-20 DIAGNOSIS — L821 Other seborrheic keratosis: Secondary | ICD-10-CM | POA: Diagnosis not present

## 2023-09-27 DIAGNOSIS — M170 Bilateral primary osteoarthritis of knee: Secondary | ICD-10-CM | POA: Diagnosis not present

## 2023-10-04 DIAGNOSIS — M170 Bilateral primary osteoarthritis of knee: Secondary | ICD-10-CM | POA: Diagnosis not present

## 2023-10-09 IMAGING — CT CT HEAD W/O CM
4 series · 16 of 47 positions shown, 18 images · non-contrast
Comparison: MR head 05/06/2020

CLINICAL DATA: Neuro deficit, acute, stroke suspected. sudden onset
word garbage , had 2 episodes lasting 2-3 minutes apart, 1st around
9999 and 2nd around 6966. Speech is clear at present, pt denies any
numbness, weakness or other sx

EXAM:
CT HEAD WITHOUT CONTRAST
TECHNIQUE: Contiguous axial images were obtained from the base of the skull
through the vertex without intravenous contrast.

[Series 2: head bone · axial · 0.43mm/px · z∈[+382,+414]mm · 3 of 80 slices shown]
[im 8/80  bone]
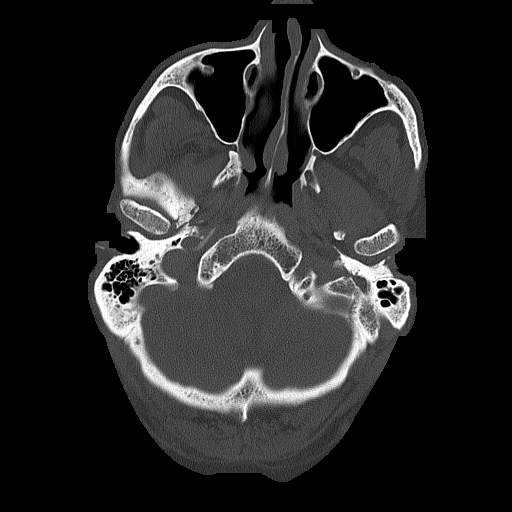
[im 16/80  bone]
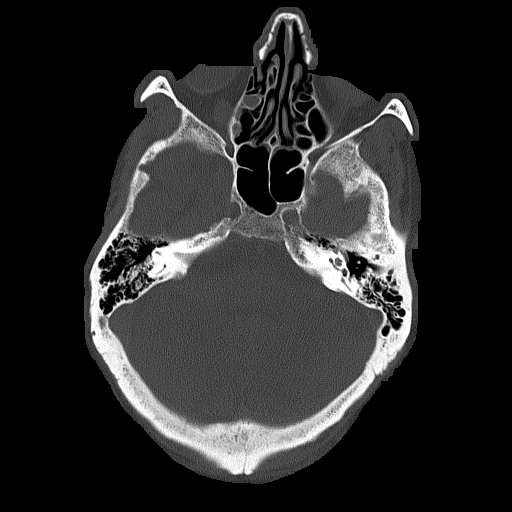
[im 24/80  bone]
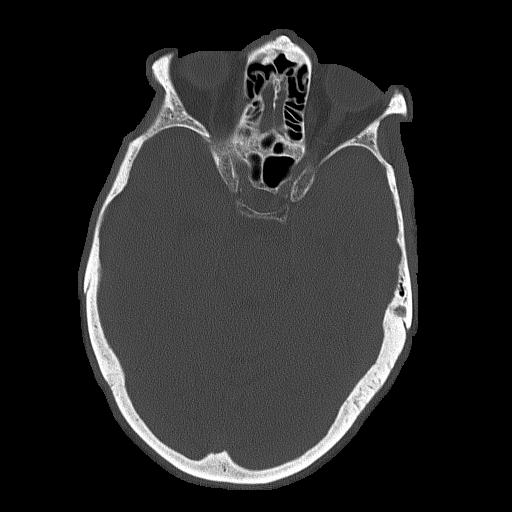

[Series 3: coronal soft tissue · coronal · 0.34mm/px · 3 of 76 slices shown]
[im 27/76  brain]
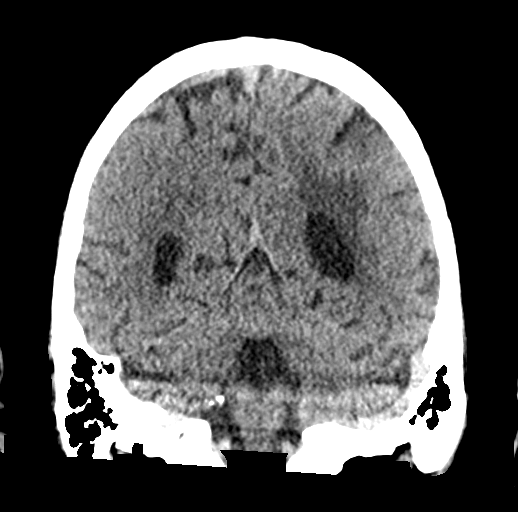
[im 34/76  brain]
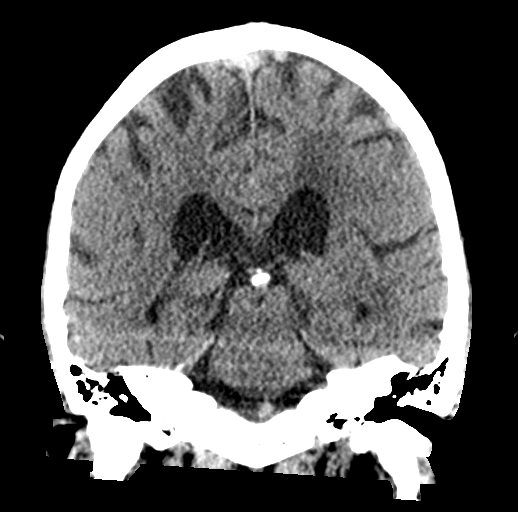
[im 42/76  brain]
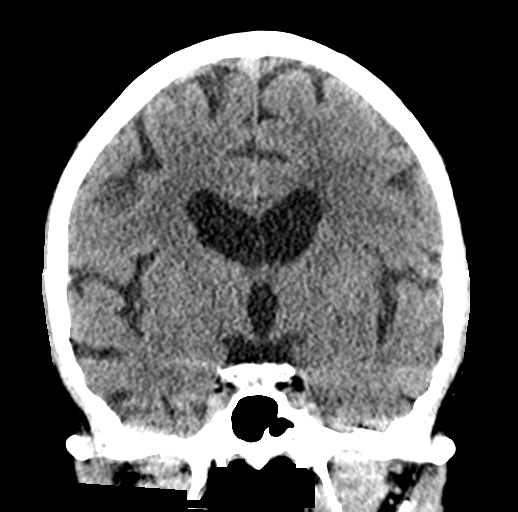

[Series 4: sagittal soft tissue · sagittal · 0.34mm/px · 3 of 60 slices shown]
[im 20/60  brain]
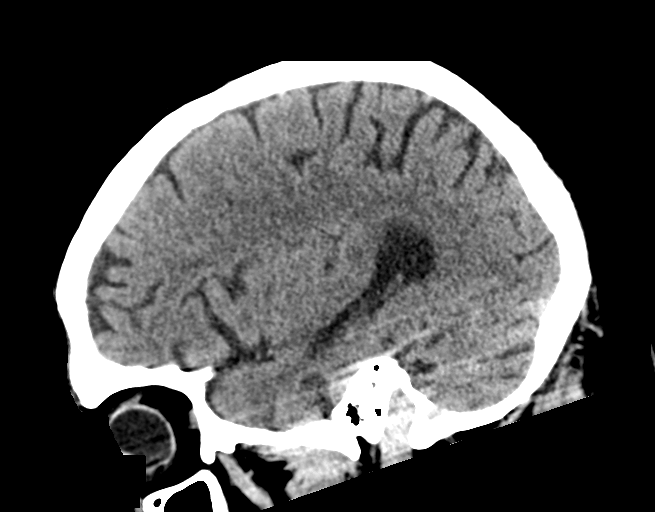
[im 30/60  brain]
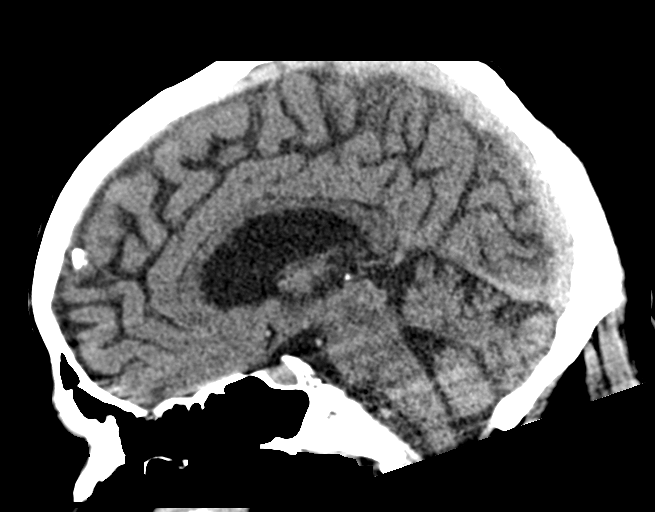
[im 40/60  brain]
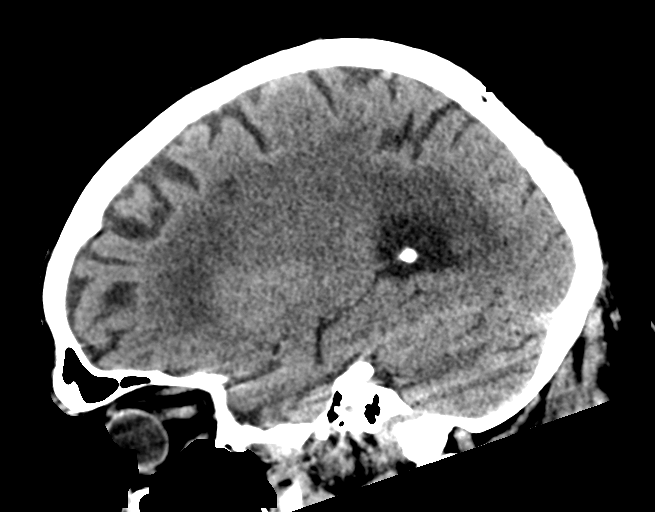

[Series 5: head wo · axial · 0.43mm/px · z∈[+383,+503]mm · 7 of 32 slices shown, 9 images]
[im 4/32  brain]
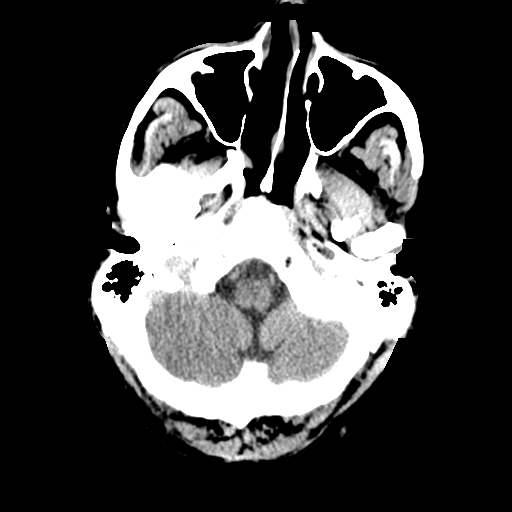
[im 4/32  bone]
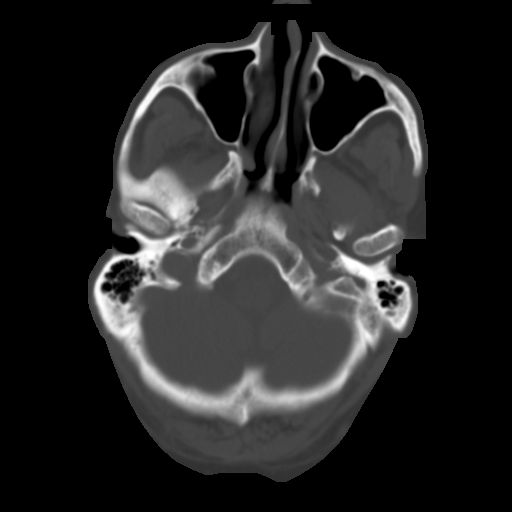
[im 8/32  brain]
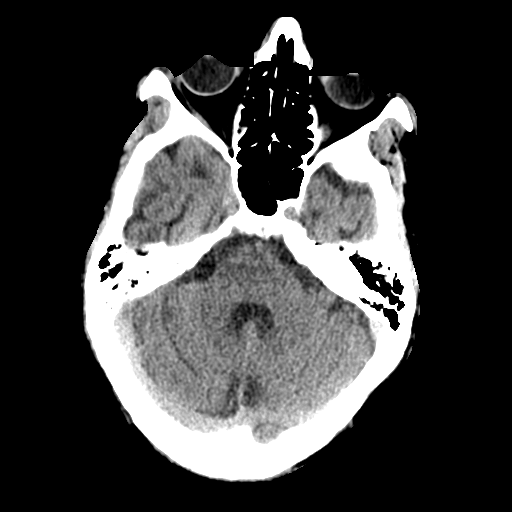
[im 12/32  brain]
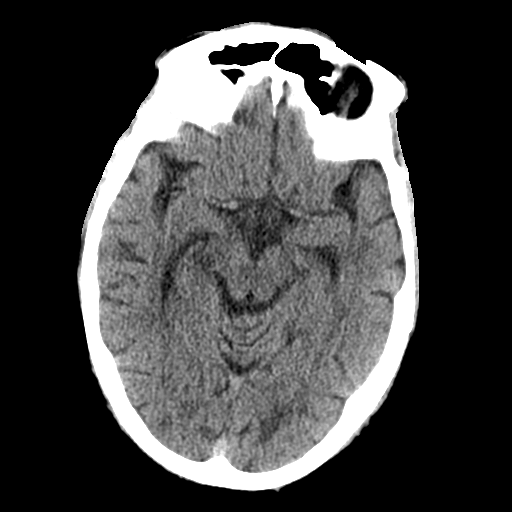
[im 16/32  brain]
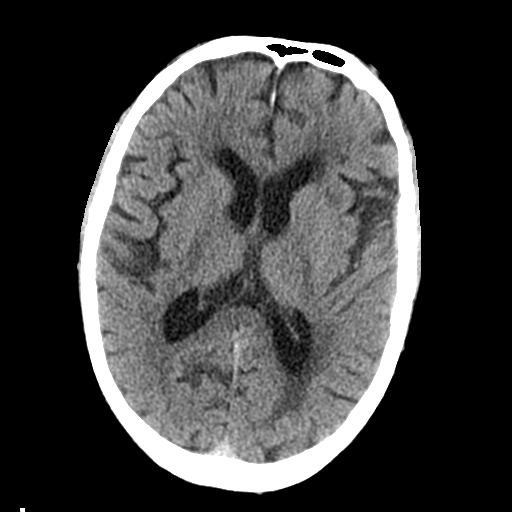
[im 20/32  brain]
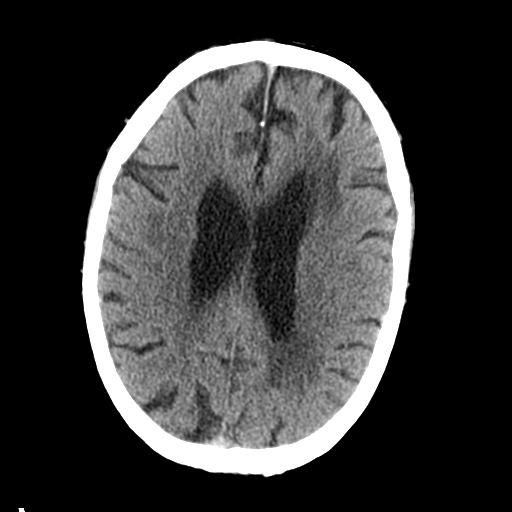
[im 20/32  bone]
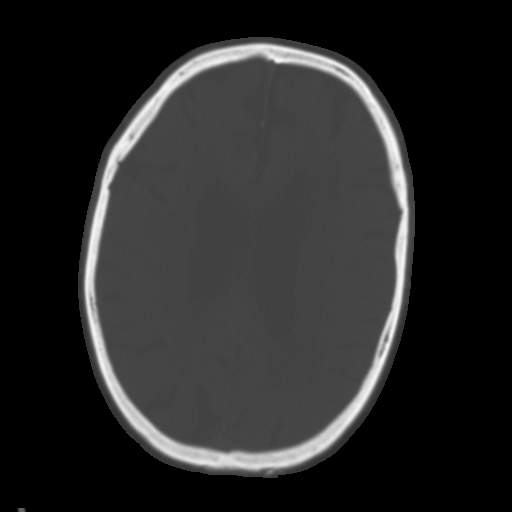
[im 24/32  brain]
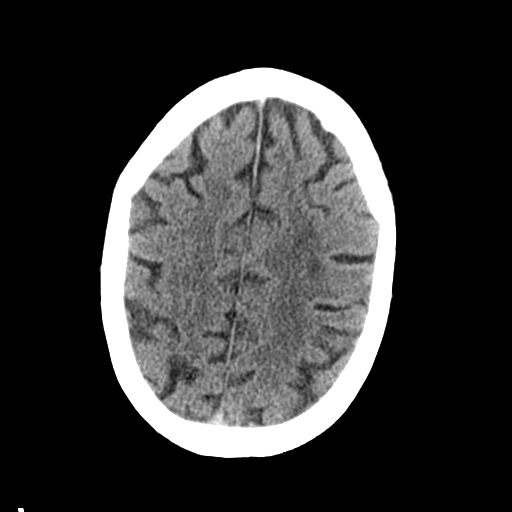
[im 28/32  brain]
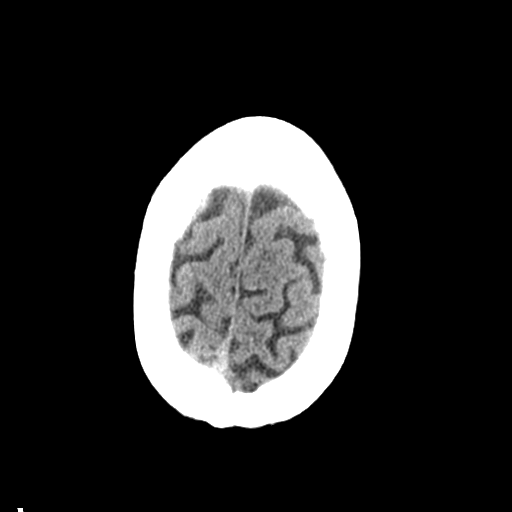

[16 of 47 positions shown; findings below may reference images not displayed]

BRAIN:
BRAIN
Patchy and confluent areas of decreased attenuation are noted
throughout the deep and periventricular white matter of the cerebral
hemispheres bilaterally, compatible with chronic microvascular
ischemic disease.

Limited evaluation of the temporal lobe, occipital lobes, cerebellum
due to streak artifact. No evidence of large-territorial acute
infarction. No parenchymal hemorrhage. No mass lesion. No
extra-axial collection.

No mass effect or midline shift. No hydrocephalus. Basilar cisterns
are patent.

Vascular: No hyperdense vessel.

Skull: No acute fracture or focal lesion.

Sinuses/Orbits: Paranasal sinuses and mastoid air cells are clear.
The orbits are unremarkable.

Other: None.
IMPRESSION: No acute intracranial abnormality in a patient with chronic
microvascular ischemic changes. Consider MRI head noncontrast for
further evaluation.

## 2023-10-11 DIAGNOSIS — M170 Bilateral primary osteoarthritis of knee: Secondary | ICD-10-CM | POA: Diagnosis not present

## 2023-12-15 ENCOUNTER — Other Ambulatory Visit (INDEPENDENT_AMBULATORY_CARE_PROVIDER_SITE_OTHER): Payer: Self-pay | Admitting: Vascular Surgery

## 2023-12-28 ENCOUNTER — Other Ambulatory Visit: Payer: Self-pay | Admitting: Internal Medicine

## 2024-01-07 ENCOUNTER — Ambulatory Visit: Admitting: Internal Medicine

## 2024-01-07 ENCOUNTER — Encounter: Payer: Self-pay | Admitting: Internal Medicine

## 2024-01-07 VITALS — BP 140/68 | HR 48 | Temp 97.7°F | Ht 72.0 in | Wt 206.8 lb

## 2024-01-07 DIAGNOSIS — E538 Deficiency of other specified B group vitamins: Secondary | ICD-10-CM

## 2024-01-07 DIAGNOSIS — F5102 Adjustment insomnia: Secondary | ICD-10-CM

## 2024-01-07 DIAGNOSIS — Z0000 Encounter for general adult medical examination without abnormal findings: Secondary | ICD-10-CM

## 2024-01-07 DIAGNOSIS — R5383 Other fatigue: Secondary | ICD-10-CM | POA: Diagnosis not present

## 2024-01-07 DIAGNOSIS — R7301 Impaired fasting glucose: Secondary | ICD-10-CM

## 2024-01-07 DIAGNOSIS — I10 Essential (primary) hypertension: Secondary | ICD-10-CM

## 2024-01-07 DIAGNOSIS — D751 Secondary polycythemia: Secondary | ICD-10-CM

## 2024-01-07 DIAGNOSIS — E785 Hyperlipidemia, unspecified: Secondary | ICD-10-CM

## 2024-01-07 DIAGNOSIS — Z23 Encounter for immunization: Secondary | ICD-10-CM

## 2024-01-07 DIAGNOSIS — M6281 Muscle weakness (generalized): Secondary | ICD-10-CM

## 2024-01-07 DIAGNOSIS — R419 Unspecified symptoms and signs involving cognitive functions and awareness: Secondary | ICD-10-CM

## 2024-01-07 LAB — LIPID PANEL
Cholesterol: 124 mg/dL (ref 0–200)
HDL: 56.3 mg/dL (ref >39.00)
LDL Cholesterol: 37 mg/dL (ref 0–99)
NonHDL: 67.83
Total CHOL/HDL Ratio: 2
Triglycerides: 152 mg/dL — ABNORMAL HIGH (ref 0.0–149.0)
VLDL: 30.4 mg/dL (ref 0.0–40.0)

## 2024-01-07 LAB — COMPREHENSIVE METABOLIC PANEL WITH GFR
ALT: 30 U/L (ref 0–53)
AST: 24 U/L (ref 0–37)
Albumin: 4.2 g/dL (ref 3.5–5.2)
Alkaline Phosphatase: 82 U/L (ref 39–117)
BUN: 21 mg/dL (ref 6–23)
CO2: 23 meq/L (ref 19–32)
Calcium: 9.4 mg/dL (ref 8.4–10.5)
Chloride: 108 meq/L (ref 96–112)
Creatinine, Ser: 1.21 mg/dL (ref 0.40–1.50)
GFR: 56.13 mL/min — ABNORMAL LOW (ref >60.00)
Glucose, Bld: 92 mg/dL (ref 70–99)
Potassium: 4.7 meq/L (ref 3.5–5.1)
Sodium: 141 meq/L (ref 135–145)
Total Bilirubin: 1.4 mg/dL — ABNORMAL HIGH (ref 0.2–1.2)
Total Protein: 6.5 g/dL (ref 6.0–8.3)

## 2024-01-07 LAB — CBC WITH DIFFERENTIAL/PLATELET
Basophils Absolute: 0 K/uL (ref 0.0–0.1)
Basophils Relative: 0.8 % (ref 0.0–3.0)
Eosinophils Absolute: 0.2 K/uL (ref 0.0–0.7)
Eosinophils Relative: 3.8 % (ref 0.0–5.0)
HCT: 50.2 % (ref 39.0–52.0)
Hemoglobin: 16.7 g/dL (ref 13.0–17.0)
Lymphocytes Relative: 17.8 % (ref 12.0–46.0)
Lymphs Abs: 1 K/uL (ref 0.7–4.0)
MCHC: 33.3 g/dL (ref 30.0–36.0)
MCV: 92.7 fl (ref 78.0–100.0)
Monocytes Absolute: 0.7 K/uL (ref 0.1–1.0)
Monocytes Relative: 12.7 % — ABNORMAL HIGH (ref 3.0–12.0)
Neutro Abs: 3.7 K/uL (ref 1.4–7.7)
Neutrophils Relative %: 64.9 % (ref 43.0–77.0)
Platelets: 239 K/uL (ref 150.0–400.0)
RBC: 5.41 Mil/uL (ref 4.22–5.81)
RDW: 13.9 % (ref 11.5–15.5)
WBC: 5.7 K/uL (ref 4.0–10.5)

## 2024-01-07 LAB — LDL CHOLESTEROL, DIRECT: Direct LDL: 48 mg/dL

## 2024-01-07 LAB — B12 AND FOLATE PANEL
Folate: 7.2 ng/mL (ref >5.9)
Vitamin B-12: 237 pg/mL (ref 211–911)

## 2024-01-07 LAB — HEMOGLOBIN A1C: Hgb A1c MFr Bld: 6.2 % (ref 4.6–6.5)

## 2024-01-07 LAB — TSH: TSH: 2.5 u[IU]/mL (ref 0.35–5.50)

## 2024-01-07 MED ORDER — AMLODIPINE BESYLATE 5 MG PO TABS: 5.0000 mg | ORAL_TABLET | Freq: Every day | ORAL | 1 refills | Status: DC

## 2024-01-07 MED ORDER — TETANUS-DIPHTH-ACELL PERTUSSIS 5-2-15.5 LF-MCG/0.5 IM SUSP: 0.5000 mL | Freq: Once | INTRAMUSCULAR | 0 refills | Status: AC

## 2024-01-07 NOTE — Assessment & Plan Note (Signed)
 Continue alprazolam  low dose.  Taking 1/2 dose at bedtime occasionally the second half

## 2024-01-07 NOTE — Patient Instructions (Addendum)
 1) You need to exercise every day to prevent muscle atrophy  and improve your sleep habits  2) You are OVERDUE for your tetanus-diptheria-pertussis vaccine   (TDaP)   Please get this done at your pharmacy l;  it will be PAID FOR MY MEDICARE ONLY AT YOUR PHARMACY   3) Your blood pressure is too high.  Please increase your amlodipine  dose to 5 mg daily.  Use the 2.5 mg tablets to do this.  New rx will be placed on file    3) you might want to try adding  Relaxium for insomnia  (as seen on TV commercials) . It is available through [employer] and contains all natural supplements:  Melatonin 5 mg  Chamomile 25 mg Passionflower extract 75 mg GABA 100 mg Ashwaganda extract 125 mg Magnesium citrate, glycinate, oxide (100 mg)  L tryptophan 500 mg Valerest (proprietary  ingredient ; probably valeria root extract)

## 2024-01-07 NOTE — Progress Notes (Signed)
 Patient ID: Bobby Murillo, male    DOB: Jun 25, 1940  Age: 83 y.o. [name]: [id number]  The patient is here for annual preventive examination and management of other chronic and acute problems.   The risk factors are reflected in the social history.   The roster of all physicians providing medical care to patient - is listed in the Snapshot section of the chart.   Activities of daily living:  The patient is 100% independent in all ADLs: dressing, toileting, feeding as well as independent mobility   Home safety : The patient has smoke detectors in the home. They wear seatbelts.  There are no unsecured firearms at home. There is no violence in the home.    There is no risks for hepatitis, STDs or HIV. There is no   history of blood transfusion. They have no travel history to infectious disease endemic areas of the world.   The patient has seen their dentist in the last six month. They have seen their eye doctor in the last year. The patinet  denies slight hearing difficulty with regard to whispered voices and some television programs.  They have deferred audiologic testing in the last year.  They do not  have excessive sun exposure. Discussed the need for sun protection: hats, long sleeves and use of sunscreen if there is significant sun exposure.    Diet: the importance of a healthy diet is discussed. They do have a healthy diet.   The benefits of regular aerobic exercise were discussed. The patient  golfs 1-2  times per week but otherwise does not exercise .    Depression screen: there are no signs or vegative symptoms of depression- irritability, change in appetite, anhedonia, sadness/tearfullness.   The following portions of the patient's history were reviewed and updated as appropriate: allergies, current medications, past family history, past medical history,  past surgical history, past social history  and problem list.   Visual acuity was not assessed per patient preference since the patient  has regular follow up with an  ophthalmologist. Hearing and body mass index were assessed and reviewed.    During the course of the visit the patient was educated and counseled about appropriate screening and preventive services including : fall prevention , diabetes screening, nutrition counseling, colorectal cancer screening, and recommended immunizations.    Chief Complaint:   bilateral knee pain due to DJD complicated by post CVA balance issues .  Seeing [facility],  last visit iN July   synvisc injections given in both knees July 28.  3 rounds completed.  No big difference . Had vestibular PT 3 times  at [facility].  Helped minimally but has not continued the exericses at home   Loss of balance secondary to CVA: no longer playing tennis  since CVA due to balance issues.  Golfing twice a week wit ha bunch of 72 yr ol  Hypertension: patient checks blood pressure twice weekly at home.  Readings have been for the most part >140/80 at rest . Patient is following a reduced salt diet most days and is taking medications as prescribed   Review of Symptoms  Patient denies headache, fevers, malaise, unintentional weight loss, skin rash, eye pain, sinus congestion and sinus pain, sore throat, dysphagia,  hemoptysis , cough, dyspnea, wheezing, chest pain, palpitations, orthopnea, edema, abdominal pain, nausea, melena, diarrhea, constipation, flank pain, dysuria, hematuria, urinary  Frequency, nocturia, numbness, tingling, seizures,  Focal weakness, Loss of consciousness,  Tremor, insomnia, depression, anxiety, and suicidal ideation.  Physical Exam:  BP (!) 140/68 (Cuff Size: Normal)   Pulse (!) 48   Temp 97.7 F (36.5 C) (Oral)   Ht 6' (1.829 m)   Wt 206 lb 12.8 oz (93.8 kg)   SpO2 96%   BMI 28.05 kg/m    Physical Exam Vitals reviewed.  Constitutional:      General: He is not in acute distress.    Appearance: Normal appearance. He is normal weight. He is not ill-appearing, toxic-appearing or  diaphoretic.  HENT:     Head: Normocephalic and atraumatic.     Right Ear: Tympanic membrane, ear canal and external ear normal. There is no impacted cerumen.     Left Ear: Tympanic membrane, ear canal and external ear normal. There is no impacted cerumen.     Nose: Nose normal.     Mouth/Throat:     Mouth: Mucous membranes are moist.     Pharynx: Oropharynx is clear.  Eyes:     General: No scleral icterus.       Right eye: No discharge.        Left eye: No discharge.     Conjunctiva/sclera: Conjunctivae normal.  Neck:     Thyroid : No thyromegaly.     Vascular: No carotid bruit or JVD.  Cardiovascular:     Rate and Rhythm: Normal rate and regular rhythm.     Heart sounds: Normal heart sounds.  Pulmonary:     Effort: Pulmonary effort is normal. No respiratory distress.     Breath sounds: Normal breath sounds.  Abdominal:     General: Bowel sounds are normal.     Palpations: Abdomen is soft. There is no mass.     Tenderness: There is no abdominal tenderness. There is no guarding or rebound.  Musculoskeletal:        General: Normal range of motion.     Cervical back: Normal range of motion and neck supple.  Lymphadenopathy:     Cervical: No cervical adenopathy.  Skin:    General: Skin is warm and dry.  Neurological:     General: No focal deficit present.     Mental Status: He is alert and oriented to person, place, and time. Mental status is at baseline.  Psychiatric:        Mood and Affect: Mood normal.        Behavior: Behavior normal.        Thought Content: Thought content normal.        Judgment: Judgment normal.     Assessment and Plan: Essential hypertension Assessment & Plan: Not at goal on current regimen of losartan  100 mg daily and amlodipine  2.5 mg daily .  Advised to increase dose to 5 mg daily.   Renal function stable, no changes today.   Orders: -     Comprehensive metabolic panel with GFR  Hyperlipidemia LDL goal <100 Assessment & Plan: LDL is at  goal with Praluent  and Zetia    Lab Results  Component Value Date   CHOL 124 01/07/2024   HDL 56.30 01/07/2024   LDLCALC 37 01/07/2024   LDLDIRECT 48.0 01/07/2024   TRIG 152.0 (H) 01/07/2024   CHOLHDL 2 01/07/2024     Orders: -     Lipid panel -     LDL cholesterol, direct  Other fatigue -     CBC with Differential/Platelet -     TSH  Impaired fasting glucose -     Comprehensive metabolic panel with GFR -  Hemoglobin A1c  Insomnia due to psychological stress Assessment & Plan: Continue alprazolam  low dose.  Taking 1/2 dose at bedtime occasionally the second half    B12 deficiency Assessment & Plan: b12 deficiency diagnosed. patient asked to return for B12 injections  and IF ab level   Orders: -     B12 and Folate Panel -     Intrinsic Factor Antibodies; Future  Cognitive complaints with normal neuropsychological exam Assessment & Plan: B12 deficiency noted on today's labs.     Encounter for preventive health examination Assessment & Plan: age appropriate education and counseling updated, referrals for preventative services and immunizations addressed, dietary and smoking counseling addressed, most recent labs reviewed.  I have personally reviewed and have noted:   1) the patient's medical and social history 2) The pt's use of alcohol, tobacco, and illicit drugs 3) The patient's current medications and supplements 4) Functional ability including ADL's, fall risk, home safety risk, hearing and visual impairment 5) Diet and physical activities 6) Evidence for depression or mood disorder 7) The patient's height, weight, and BMI have been recorded in the chart   I have made referrals, and provided counseling and education based on review of the above    Erythrocytosis Assessment & Plan:  Likely due to untreated OSA per hematology.    He has declined use of CPAP due to intolerance    Lab Results  Component Value Date   WBC 5.7 01/07/2024   HGB 16.7  01/07/2024   HCT 50.2 01/07/2024   MCV 92.7 01/07/2024   PLT 239.0 01/07/2024      Proximal muscle weakness Assessment & Plan: He has not continued the exercises given tohim by PT.  Encouraged to resume regular exercise using the community gym.    Other orders -     Flu vaccine HIGH DOSE PF(Fluzone Trivalent) -     Tetanus-Diphth-Acell Pertussis; Inject 0.5 mLs into the muscle once for 1 dose.  Dispense: 0.5 mL; Refill: 0 -     amLODIPine  Besylate; Take 1 tablet (5 mg total) by mouth daily.  Dispense: 90 tablet; Refill: 1    No follow-ups on file.  Bobby LITTIE Kettering, MD

## 2024-01-09 ENCOUNTER — Ambulatory Visit: Payer: Self-pay | Admitting: Internal Medicine

## 2024-01-09 DIAGNOSIS — E538 Deficiency of other specified B group vitamins: Secondary | ICD-10-CM | POA: Insufficient documentation

## 2024-01-09 NOTE — Assessment & Plan Note (Signed)
 Not at goal on current regimen of losartan  100 mg daily and amlodipine  2.5 mg daily .  Advised to increase dose to 5 mg daily.   Renal function stable, no changes today.

## 2024-01-09 NOTE — Assessment & Plan Note (Signed)
 LDL is at goal with Praluent  and Zetia    Lab Results  Component Value Date   CHOL 124 01/07/2024   HDL 56.30 01/07/2024   LDLCALC 37 01/07/2024   LDLDIRECT 48.0 01/07/2024   TRIG 152.0 (H) 01/07/2024   CHOLHDL 2 01/07/2024

## 2024-01-09 NOTE — Assessment & Plan Note (Signed)
 B12 deficiency noted on today's labs.

## 2024-01-09 NOTE — Assessment & Plan Note (Signed)
 Likely due to untreated OSA per hematology.    He has declined use of CPAP due to intolerance    Lab Results  Component Value Date   WBC 5.7 01/07/2024   HGB 16.7 01/07/2024   HCT 50.2 01/07/2024   MCV 92.7 01/07/2024   PLT 239.0 01/07/2024

## 2024-01-09 NOTE — Assessment & Plan Note (Signed)
 b12 deficiency diagnosed. patient asked to return for B12 injections  and IF ab level

## 2024-01-09 NOTE — Assessment & Plan Note (Signed)

## 2024-01-09 NOTE — Assessment & Plan Note (Signed)
 He has not continued the exercises given tohim by PT.  Encouraged to resume regular exercise using the community gym.

## 2024-01-10 ENCOUNTER — Ambulatory Visit

## 2024-01-10 DIAGNOSIS — E538 Deficiency of other specified B group vitamins: Secondary | ICD-10-CM

## 2024-01-10 MED ORDER — CYANOCOBALAMIN 1000 MCG/ML IJ SOLN
1000.0000 ug | Freq: Once | INTRAMUSCULAR | Status: AC
  Administered 2024-01-10: 1000 ug via INTRAMUSCULAR

## 2024-01-10 NOTE — Progress Notes (Signed)
 Pt presented for their vitamin B12 injection (1st of 4 weekly). Pt was identified through two identifiers. Pt tolerated shot well in their left deltoid.

## 2024-01-11 ENCOUNTER — Ambulatory Visit: Payer: Self-pay | Admitting: Internal Medicine

## 2024-01-11 LAB — INTRINSIC FACTOR ANTIBODIES: Intrinsic Factor Abs, Serum: 1.1 [AU]/ml (ref 0.0–1.1)

## 2024-01-16 ENCOUNTER — Other Ambulatory Visit: Payer: Self-pay | Admitting: Internal Medicine

## 2024-01-16 DIAGNOSIS — F5102 Adjustment insomnia: Secondary | ICD-10-CM

## 2024-01-17 ENCOUNTER — Ambulatory Visit (INDEPENDENT_AMBULATORY_CARE_PROVIDER_SITE_OTHER)

## 2024-01-17 DIAGNOSIS — E538 Deficiency of other specified B group vitamins: Secondary | ICD-10-CM | POA: Diagnosis not present

## 2024-01-17 MED ORDER — CYANOCOBALAMIN 1000 MCG/ML IJ SOLN
1000.0000 ug | Freq: Once | INTRAMUSCULAR | Status: AC
  Administered 2024-01-17: 1000 ug via INTRAMUSCULAR

## 2024-01-17 NOTE — Progress Notes (Signed)
 Pt presented for their 2nd of 4 weekly  vitamin B12 injection. Pt was identified through two identifiers. Pt tolerated shot well in their right deltoid.

## 2024-01-24 ENCOUNTER — Ambulatory Visit

## 2024-01-24 DIAGNOSIS — E538 Deficiency of other specified B group vitamins: Secondary | ICD-10-CM | POA: Diagnosis not present

## 2024-01-24 MED ORDER — CYANOCOBALAMIN 1000 MCG/ML IJ SOLN
1000.0000 ug | Freq: Once | INTRAMUSCULAR | Status: AC
  Administered 2024-01-24: 1000 ug via INTRAMUSCULAR

## 2024-01-24 NOTE — Progress Notes (Signed)
 Patient was administered a B12 injection into his left deltoid. Patient tolerated the B12 injection well.

## 2024-01-31 ENCOUNTER — Ambulatory Visit

## 2024-01-31 DIAGNOSIS — E538 Deficiency of other specified B group vitamins: Secondary | ICD-10-CM | POA: Diagnosis not present

## 2024-01-31 MED ORDER — CYANOCOBALAMIN 1000 MCG/ML IJ SOLN
1000.0000 ug | Freq: Once | INTRAMUSCULAR | Status: AC
  Administered 2024-01-31: 1000 ug via INTRAMUSCULAR

## 2024-01-31 NOTE — Progress Notes (Signed)
 Patient was administered a B12 injection into his right deltoid. Patient tolerated the B12 injection well.

## 2024-02-18 ENCOUNTER — Encounter: Payer: Self-pay | Admitting: Cardiology

## 2024-02-18 ENCOUNTER — Ambulatory Visit: Attending: Cardiology | Admitting: Cardiology

## 2024-02-18 VITALS — BP 104/60 | HR 69 | Ht 72.0 in | Wt 210.2 lb

## 2024-02-18 DIAGNOSIS — I10 Essential (primary) hypertension: Secondary | ICD-10-CM | POA: Diagnosis not present

## 2024-02-18 DIAGNOSIS — I6522 Occlusion and stenosis of left carotid artery: Secondary | ICD-10-CM

## 2024-02-18 DIAGNOSIS — E782 Mixed hyperlipidemia: Secondary | ICD-10-CM | POA: Diagnosis not present

## 2024-02-18 NOTE — Patient Instructions (Signed)

## 2024-02-18 NOTE — Progress Notes (Signed)
 Cardiology Office Note:    Date:  02/18/2024   ID:  Bobby Murillo, DOB 05-Mar-1941, [name] [id number]  PCP:  Marylynn Verneita CROME, MD   [facility] Providers Cardiologist:  Redell Cave, MD     Referring MD: Marylynn Verneita CROME, MD   Chief Complaint  Patient presents with   Follow-up    12 month follow up pa has been doing well with no complaints of chest pain, chest pressure or SOB, medciation reviewed verbally with patient    History of Present Illness:    Bobby Murillo is a 83 y.o. male with a hx of hyperlipidemia, hypertension, CKD 3, left CVA , carotid artery stenosis s/p carotid stent 12/2021, L. CEA 02/2021 who presents for follow-up.  Doing okay, denies chest pain or shortness of breath.  Compliant with medications as prescribed.  Previously seen for symptoms of chest pain, Lexiscan  Myoview  showed no significant ischemia, low risk study.  Patient doing well, denies any cardiac issues.    Prior notes Lexiscan  Myoview  11/24 no significant ischemia, low risk study. Echocardiogram 01/04/2021 showed normal ejection fraction EF 55 to 60%, no gross structural abnormalities. Lexiscan  Myoview  01/2021 no evidence for ischemia, minimal coronary calcifications Cardiac monitor 03/2018 20, frequent PACs,   Past Medical History:  Diagnosis Date   Abnormal LFTs    Acute cerebrovascular accident (CVA) due to thrombosis of left middle cerebral artery (HCC) 01/05/2021   Anxiety    Aortic atherosclerosis    Arthritis    Basal cell carcinoma 09/14/2010   left calf    Bilateral carotid artery stenosis    a.) Carotid doppler 01/03/2021: 70-99% LICA; 0-49% RICA. b.) CTA neck 01/30/2021: critical stenosis LEFT carotid bulb.   Carotid stenosis, symptomatic, with infarction (HCC) 01/20/2021   CKD (chronic kidney disease), stage III (HCC)    History of kidney stones    Hyperlipidemia    Hypertension    Long term current use of antithrombotics/antiplatelets    a.) DAPT therapy (ASA +  clopidogrel )   Prostate cancer (HCC) 03/16/2008   Dr. Buell   Sinus bradycardia by electrocardiogram    Stroke (HCC) 01/03/2021   a.) Acute infarction affecting the cortex. ?? incidental punctate acute white matter infarction just posterior to the atrium of the left lateral ventricle. Stenosis of the right M2 branch inferior division. Severe atherosclerotic disease of both PCAs with severe stenoses.    Past Surgical History:  Procedure Laterality Date   CAROTID PTA/STENT INTERVENTION Left 12/30/2021   Procedure: CAROTID PTA/STENT INTERVENTION;  Surgeon: Jama Cordella MATSU, MD;  Location: [facility] INVASIVE CV LAB;  Service: Cardiovascular;  Laterality: Left;   CATARACT EXTRACTION Bilateral    05/2021   COLONOSCOPY     ENDARTERECTOMY Left 02/19/2021   Procedure: ENDARTERECTOMY CAROTID;  Surgeon: Jama Cordella MATSU, MD;  Location: [facility] ORS;  Service: Vascular;  Laterality: Left;   PROSTATECTOMY  11/14/2008   transurethral, radical, Dr. Charles    Current Medications: Current Meds  Medication Sig   acetaminophen  (TYLENOL ) 325 MG tablet Take 650 mg by mouth every 6 (six) hours as needed for moderate pain.   ALPRAZolam  (XANAX ) 0.5 MG tablet TAKE 1 TABLET BY MOUTH AT BEDTIME AS NEEDED FOR SLEEP   amLODipine  (NORVASC ) 5 MG tablet Take 1 tablet (5 mg total) by mouth daily.   aspirin  EC 81 MG tablet Take 81 mg by mouth daily. Swallow whole.   clopidogrel  (PLAVIX ) 75 MG tablet Take 1 tablet by mouth once daily   Evolocumab  (REPATHA  SURECLICK) 140  MG/ML SOAJ Inject 140 mg into the skin every 14 (fourteen) days.   ezetimibe  (ZETIA ) 10 MG tablet Take 1 tablet (10 mg total) by mouth daily.   losartan  (COZAAR ) 100 MG tablet Take 1 tablet (100 mg total) by mouth daily.   polycarbophil (FIBERCON) 625 MG tablet Take 625 mg by mouth daily.     Allergies:   Alfuzosin and Pravastatin sodium   Social History   Socioeconomic History   Marital status: Married    Spouse name: Rock   Number of children: Not  on file   Years of education: Not on file   Highest education level: Not on file  Occupational History   Not on file  Tobacco Use   Smoking status: Never   Smokeless tobacco: Never  Vaping Use   Vaping status: Never Used  Substance and Sexual Activity   Alcohol use: Yes    Alcohol/week: 8.0 standard drinks of alcohol    Types: 4 Glasses of wine, 4 Shots of liquor per week    Comment: occasional   Drug use: No   Sexual activity: Yes  Other Topics Concern   Not on file  Social History Narrative   Patient lives in [place] with his wife.  Non-smoker.  Drinks over the weekend.  Retired from [employer].      Right handed   Social Drivers of Health   Financial Resource Strain: Low Risk  (04/23/2023)   Overall Financial Resource Strain (CARDIA)    Difficulty of Paying Living Expenses: Not hard at all  Food Insecurity: No Food Insecurity (04/23/2023)   Hunger Vital Sign    Worried About Running Out of Food in the Last Year: Never true    Ran Out of Food in the Last Year: Never true  Transportation Needs: No Transportation Needs (04/23/2023)   PRAPARE - Administrator, Civil Service (Medical): No    Lack of Transportation (Non-Medical): No  Physical Activity: Inactive (04/23/2023)   Exercise Vital Sign    Days of Exercise per Week: 0 days    Minutes of Exercise per Session: 0 min  Stress: No Stress Concern Present (04/23/2023)   [employer] of Occupational Health - Occupational Stress Questionnaire    Feeling of Stress : Only a little  Social Connections: Moderately Integrated (04/23/2023)   Social Connection and Isolation Panel    Frequency of Communication with Friends and Family: Twice a week    Frequency of Social Gatherings with Friends and Family: More than three times a week    Attends Religious Services: Never    Database Administrator or Organizations: Yes    Attends Engineer, Structural: More than 4 times per year    Marital Status: Married     Family  History: The patient's family history includes Alzheimer's disease in his mother and sister; Heart disease (age of onset: 69) in his paternal uncle; Prostate cancer in his father; Stroke in his father and paternal aunt.  ROS:   Please see the history of present illness.     All other systems reviewed and are negative.  EKGs/Labs/Other Studies Reviewed:    The following studies were reviewed today:  EKG Interpretation Date/Time:  Friday February 18 2024 09:43:08 EST Ventricular Rate:  69 PR Interval:  162 QRS Duration:  90 QT Interval:  396 QTC Calculation: 424 R Axis:   7  Text Interpretation: Sinus rhythm with marked sinus arrhythmia Confirmed by Darliss Rogue ([id number]) on 02/18/2024 9:58:44 AM  Recent Labs: 01/07/2024: ALT 30; BUN 21; Creatinine, Ser 1.21; Hemoglobin 16.7; Platelets 239.0; Potassium 4.7; Sodium 141; TSH 2.50  Recent Lipid Panel    Component Value Date/Time   CHOL 124 01/07/2024 1029   CHOL 280 (H) 10/23/2011 1142   TRIG 152.0 (H) 01/07/2024 1029   HDL 56.30 01/07/2024 1029   HDL 63 10/23/2011 1142   CHOLHDL 2 01/07/2024 1029   VLDL 30.4 01/07/2024 1029   LDLCALC 37 01/07/2024 1029   LDLCALC 192 (H) 10/23/2011 1142   LDLDIRECT 48.0 01/07/2024 1029     Risk Assessment/Calculations:          Physical Exam:    VS:  BP 104/60 (BP Location: Left Arm, Patient Position: Sitting, Cuff Size: Normal)   Pulse 69   Ht 6' (1.829 m)   Wt 210 lb 3.2 oz (95.3 kg)   SpO2 98%   BMI 28.51 kg/m     Wt Readings from Last 3 Encounters:  02/18/24 210 lb 3.2 oz (95.3 kg)  01/07/24 206 lb 12.8 oz (93.8 kg)  08/27/23 210 lb 9.6 oz (95.5 kg)     GEN:  Well nourished, well developed in no acute distress HEENT: Normal NECK: Left carotid bruit noted CARDIAC: RRR, no murmurs, rubs, gallops RESPIRATORY:  Clear to auscultation without rales, wheezing or rhonchi  ABDOMEN: Soft, non-tender, non-distended MUSCULOSKELETAL:  No edema; No deformity  SKIN: Warm and  dry NEUROLOGIC:  Alert and oriented x 3 PSYCHIATRIC:  Normal affect   ASSESSMENT:    1. Essential hypertension   2. Mixed hyperlipidemia   3. Carotid stenosis, symptomatic w/o infarct, left    PLAN:    In order of problems listed above:  Hypertension, BP controlled, continue losartan  100 mg daily, Norvasc  5 mg daily Hyperlipidemia, statin allergies, cholesterol now controlled.  Continue Praluent , Zetia  10 mg daily PAD, carotid stenosis s/p left carotid artery stent, 10/23, left CEA 2022.  Followed by vascular surgery, continue aspirin , Plavix , Praluent , Zetia .   Follow-up as needed     Medication Adjustments/Labs and Tests Ordered: Current medicines are reviewed at length with the patient today.  Concerns regarding medicines are outlined above.  Orders Placed This Encounter  Procedures   EKG 12-Lead   No orders of the defined types were placed in this encounter.    Patient Instructions  Medication Instructions:  Your physician recommends that you continue on your current medications as directed. Please refer to the Current Medication list given to you today.   *If you need a refill on your cardiac medications before your next appointment, please call your pharmacy*  Lab Work: No labs ordered today  If you have labs (blood work) drawn today and your tests are completely normal, you will receive your results only by: MyChart Message (if you have MyChart) OR A paper copy in the mail If you have any lab test that is abnormal or we need to change your treatment, we will call you to review the results.  Testing/Procedures: No test ordered today   Follow-Up: At [facility], you and your health needs are our priority.  As part of our continuing mission to provide you with exceptional heart care, our providers are all part of one team.  This team includes your primary Cardiologist (physician) and Advanced Practice Providers or APPs (Physician Assistants and Nurse  Practitioners) who all work together to provide you with the care you need, when you need it.  Your next appointment:   As needed  Signed, Redell Cave, MD  02/18/2024 10:46 AM    [facility]

## 2024-02-25 ENCOUNTER — Ambulatory Visit: Admitting: Internal Medicine

## 2024-02-25 ENCOUNTER — Encounter: Payer: Self-pay | Admitting: Internal Medicine

## 2024-02-25 VITALS — BP 132/70 | HR 52 | Ht 72.0 in | Wt 205.4 lb

## 2024-02-25 DIAGNOSIS — N1831 Chronic kidney disease, stage 3a: Secondary | ICD-10-CM

## 2024-02-25 DIAGNOSIS — I6930 Unspecified sequelae of cerebral infarction: Secondary | ICD-10-CM

## 2024-02-25 DIAGNOSIS — R2689 Other abnormalities of gait and mobility: Secondary | ICD-10-CM

## 2024-02-25 DIAGNOSIS — L239 Allergic contact dermatitis, unspecified cause: Secondary | ICD-10-CM | POA: Diagnosis not present

## 2024-02-25 DIAGNOSIS — I69311 Memory deficit following cerebral infarction: Secondary | ICD-10-CM | POA: Diagnosis not present

## 2024-02-25 MED ORDER — PREDNISONE 10 MG PO TABS: ORAL_TABLET | ORAL | 0 refills | Status: DC

## 2024-02-25 NOTE — Progress Notes (Signed)
 Subjective:  Patient ID: Bobby Murillo, male    DOB: [birth year]  Age: 83 y.o. [name]: [id number]  CC: The primary encounter diagnosis was History of CVA with residual deficit. Diagnoses of Loss of balance, Memory deficit after cerebral infarction, Stage 3a chronic kidney disease (HCC), and Allergic contact dermatitis, unspecified trigger were also pertinent to this visit.   HPI Bobby Murillo presents for  Chief Complaint  Patient presents with   Medical Management of Chronic Issues    6 month follow up    1)  persistent itching of shoulders and back  occurring for several weeks .  Has borrowed linda's hydroxyzine , which help a little .  No recent travel or overnight stays  in a hotel.  Has a red rash on arms, back and thighs .  Thinks his wife has changed detergents to ALL..  has been using the same soap for years , Dove when he showers .   Bobby Murillo  Does not use moisturizer    2) cognitive changes reported by wife  3) balance getting worse:  he has had balance issues since his CVA in 2022.  Has become more sedentary, only activity is golf  twice a week     Outpatient Medications Prior to Visit  Medication Sig Dispense Refill   acetaminophen  (TYLENOL ) 325 MG tablet Take 650 mg by mouth every 6 (six) hours as needed for moderate pain.     ALPRAZolam  (XANAX ) 0.5 MG tablet TAKE 1 TABLET BY MOUTH AT BEDTIME AS NEEDED FOR SLEEP 30 tablet 5   amLODipine  (NORVASC ) 5 MG tablet Take 1 tablet (5 mg total) by mouth daily. 90 tablet 1   aspirin  EC 81 MG tablet Take 81 mg by mouth daily. Swallow whole.     clopidogrel  (PLAVIX ) 75 MG tablet Take 1 tablet by mouth once daily 90 tablet 3   Evolocumab  (REPATHA  SURECLICK) 140 MG/ML SOAJ Inject 140 mg into the skin every 14 (fourteen) days. 2 mL 11   ezetimibe  (ZETIA ) 10 MG tablet Take 1 tablet (10 mg total) by mouth daily. 90 tablet 3   losartan  (COZAAR ) 100 MG tablet Take 1 tablet (100 mg total) by mouth daily. 90 tablet 3   polycarbophil (FIBERCON) 625 MG  tablet Take 625 mg by mouth daily.     No facility-administered medications prior to visit.    Review of Systems;  Patient denies headache, fevers, malaise, unintentional weight loss, skin rash, eye pain, sinus congestion and sinus pain, sore throat, dysphagia,  hemoptysis , cough, dyspnea, wheezing, chest pain, palpitations, orthopnea, edema, abdominal pain, nausea, melena, diarrhea, constipation, flank pain, dysuria, hematuria, urinary  Frequency, nocturia, numbness, tingling, seizures,  Focal weakness, Loss of consciousness,  Tremor, insomnia, depression, anxiety, and suicidal ideation.      Objective:  BP 132/70   Pulse (!) 52   Ht 6' (1.829 m)   Wt 205 lb 6.4 oz (93.2 kg)   SpO2 97%   BMI 27.86 kg/m   BP Readings from Last 3 Encounters:  02/25/24 132/70  02/18/24 104/60  01/07/24 (!) 140/68    Wt Readings from Last 3 Encounters:  02/25/24 205 lb 6.4 oz (93.2 kg)  02/18/24 210 lb 3.2 oz (95.3 kg)  01/07/24 206 lb 12.8 oz (93.8 kg)    Physical Exam Vitals reviewed.  Constitutional:      General: He is not in acute distress.    Appearance: Normal appearance. He is normal weight. He is not ill-appearing, toxic-appearing or diaphoretic.  HENT:     Head: Normocephalic.  Eyes:     General: No scleral icterus.       Right eye: No discharge.        Left eye: No discharge.     Conjunctiva/sclera: Conjunctivae normal.  Cardiovascular:     Rate and Rhythm: Normal rate and regular rhythm.     Heart sounds: Normal heart sounds.  Pulmonary:     Effort: Pulmonary effort is normal. No respiratory distress.     Breath sounds: Normal breath sounds.  Musculoskeletal:        General: Normal range of motion.     Cervical back: Normal range of motion.  Skin:    General: Skin is warm and dry.     Findings: Rash present.     Comments: Papular crusting lesions covering shoulders,  upper back and arms   Neurological:     General: No focal deficit present.     Mental Status: He is  alert and oriented to person, place, and time. Mental status is at baseline.  Psychiatric:        Mood and Affect: Mood normal.        Behavior: Behavior normal.        Thought Content: Thought content normal.        Judgment: Judgment normal.     Lab Results  Component Value Date   HGBA1C 6.2 01/07/2024   HGBA1C 5.9 12/25/2022   HGBA1C 6.2 05/27/2021    Lab Results  Component Value Date   CREATININE 1.21 01/07/2024   CREATININE 1.26 06/25/2023   CREATININE 1.19 12/25/2022    Lab Results  Component Value Date   WBC 5.7 01/07/2024   HGB 16.7 01/07/2024   HCT 50.2 01/07/2024   PLT 239.0 01/07/2024   GLUCOSE 92 01/07/2024   CHOL 124 01/07/2024   TRIG 152.0 (H) 01/07/2024   HDL 56.30 01/07/2024   LDLDIRECT 48.0 01/07/2024   LDLCALC 37 01/07/2024   ALT 30 01/07/2024   AST 24 01/07/2024   NA 141 01/07/2024   K 4.7 01/07/2024   CL 108 01/07/2024   CREATININE 1.21 01/07/2024   BUN 21 01/07/2024   CO2 23 01/07/2024   TSH 2.50 01/07/2024   PSA 0.00 (L) 05/27/2021   INR 1.0 02/22/2021   HGBA1C 6.2 01/07/2024   MICROALBUR <0.7 06/25/2023    US  Abdomen Limited RUQ (LIVER/GB) Result Date: 07/09/2023 CLINICAL DATA:  Persistent elevation of bilirubin EXAM: ULTRASOUND ABDOMEN LIMITED RIGHT UPPER QUADRANT COMPARISON:  CT abdomen and pelvis December 16, 2021 FINDINGS: Gallbladder: No gallstones or wall thickening visualized. No sonographic Murphy sign noted by sonographer. Common bile duct: Diameter: 4.1 mm Liver: No focal lesion. Increased echotexture. Portal vein is patent on color Doppler imaging with normal direction of blood flow towards the liver. Other: None. IMPRESSION: 1. No acute abnormality identified. 2. Increased echotexture of the liver. This is a nonspecific finding but can be seen in fatty infiltration of liver. Electronically Signed   By: Craig Farr M.D.   On: 07/09/2023 15:08    Assessment & Plan:  .History of CVA with residual deficit Assessment & Plan: His  balance has been affected by his CVA but he has not fallen. PT offered but deferred .  Advised him to increase his activity    Loss of balance Assessment & Plan: Secondary to prior CVA. Advised him to start a walking program and work on core strength.   Memory deficit after cerebral infarction Assessment & Plan:  Brought up  during CPE.  His MMSE is 28/30 today.    Stage 3a chronic kidney disease (HCC) Assessment & Plan: No prior nephrology evaluation.  GFR is stable at 57 m/min.  He is avoiding NSAIDs.   Lab Results  Component Value Date   CREATININE 1.21 01/07/2024   Lab Results  Component Value Date   NA 141 01/07/2024   K 4.7 01/07/2024   CL 108 01/07/2024   CO2 23 01/07/2024      Allergic contact dermatitis, unspecified trigger Assessment & Plan: Prednisone   taper prescribed.  Recommend detergent change  daily use of Aveeno colloidal oatmeal or Cerave, and hydroxyzine every 8 hours as needed . Follow up in 2 weeks    Other orders -     predniSONE ; 6 tablets on Day 1 , then reduce by 1 tablet daily until gone  Dispense: 21 tablet; Refill: 0    I personally spent a total of 30 minutes in the care of the patient today including getting/reviewing separately obtained history, performing a medically appropriate exam/evaluation, counseling and educating, placing orders, documenting clinical information in the EHR, and independently interpreting results.  Follow-up: Return for FOLLOW UP ON RASH .   Verneita LITTIE Kettering, MD

## 2024-02-25 NOTE — Patient Instructions (Addendum)
°  YOUR SKIN RASH DOES LOOK LIKE A CONTACT DERMATITIS AND MAY BE FROM THE DETERGENT Bobby Murillo IS USING.  IT ALSO APPEARS TO BE DUE TO DRY SKIN   ASK  Bobby Murillo  TO CHANGE TO ONE THAT HAS NO DYES OR SCENTS .  AND AVOID FABRIC SOFTENERS AS WELL  YOU NEED TO MOISTURIZE:  USE Aveeno moisturizer with oatmeal    OR Cerave:   use daily after shower  WITH Bobby Murillo'S HELP FOR THE PLACES YOU CAN'T REACH   YOU CAN USE Bobby Murillo'S HYDROXYZINE  EVERY 8 HOURS FOR THE ITCHING BUT IF IT IS TOO SEDATING,  USE ALLEGRA OR CLARITIN  INSTEAD.   EVERY 8 HOURS   I AM PRESCRIBING A PREDNISONE  TAPER: TO START ASAP    6 tablets on Day 1 , then reduce by 1 tablet daily until gone   2) YOUR MEMORY TEST WAS VERY GOOD.    3) YOUR BALANCE IS GETTING WORSE BECAUSE YOU HAVE BECOME SEDENTARY   YOU NEED TO WALK FOR 30 MINUTES EVERY DAY IF YOU DON'T WANT TO LOSE MORE STRENGTH AND BALANCE

## 2024-02-27 DIAGNOSIS — L259 Unspecified contact dermatitis, unspecified cause: Secondary | ICD-10-CM | POA: Insufficient documentation

## 2024-02-27 NOTE — Assessment & Plan Note (Signed)
 Secondary to prior CVA. Advised him to start a walking program and work on core strength.

## 2024-02-27 NOTE — Assessment & Plan Note (Signed)
 No prior nephrology evaluation.  GFR is stable at 57 m/min.  He is avoiding NSAIDs.   Lab Results  Component Value Date   CREATININE 1.21 01/07/2024   Lab Results  Component Value Date   NA 141 01/07/2024   K 4.7 01/07/2024   CL 108 01/07/2024   CO2 23 01/07/2024

## 2024-02-27 NOTE — Assessment & Plan Note (Addendum)
 Prednisone   taper prescribed.  Recommend detergent change  daily use of Aveeno colloidal oatmeal or Cerave, and hydroxyzine every 8 hours as needed . Follow up in 2 weeks

## 2024-02-27 NOTE — Assessment & Plan Note (Signed)
 Brought up  during CPE.  His MMSE is 28/30 today.

## 2024-02-27 NOTE — Assessment & Plan Note (Signed)
 His balance has been affected by his CVA but he has not fallen. PT offered but deferred .  Advised him to increase his activity

## 2024-03-28 ENCOUNTER — Other Ambulatory Visit: Payer: Self-pay | Admitting: Internal Medicine

## 2024-03-29 ENCOUNTER — Encounter: Payer: Self-pay | Admitting: Internal Medicine

## 2024-03-29 ENCOUNTER — Telehealth: Payer: Self-pay | Admitting: Internal Medicine

## 2024-03-29 ENCOUNTER — Ambulatory Visit: Admitting: Internal Medicine

## 2024-03-29 VITALS — BP 124/70 | HR 60 | Temp 97.5°F | Ht 72.0 in | Wt 202.8 lb

## 2024-03-29 DIAGNOSIS — N1831 Chronic kidney disease, stage 3a: Secondary | ICD-10-CM | POA: Diagnosis not present

## 2024-03-29 DIAGNOSIS — L2989 Other pruritus: Secondary | ICD-10-CM | POA: Diagnosis not present

## 2024-03-29 DIAGNOSIS — I69311 Memory deficit following cerebral infarction: Secondary | ICD-10-CM | POA: Diagnosis not present

## 2024-03-29 DIAGNOSIS — L239 Allergic contact dermatitis, unspecified cause: Secondary | ICD-10-CM | POA: Diagnosis not present

## 2024-03-29 MED ORDER — AMLODIPINE BESYLATE 5 MG PO TABS: 5.0000 mg | ORAL_TABLET | Freq: Every day | ORAL | 1 refills | Status: AC

## 2024-03-29 MED ORDER — LORATADINE 10 MG PO TABS: 10.0000 mg | ORAL_TABLET | Freq: Four times a day (QID) | ORAL | 2 refills | Status: AC | PRN

## 2024-03-29 MED ORDER — PREDNISONE 10 MG PO TABS: ORAL_TABLET | ORAL | 0 refills | Status: AC

## 2024-03-29 NOTE — Telephone Encounter (Signed)
 Non Opioid Controlled Substance Agreement signed and scanned in 1.14.26

## 2024-03-29 NOTE — Patient Instructions (Addendum)
 The rash DOES LOOK BETTER  BUT YOUR SKIN IS STILL DRY , YOU NEED TO USE MOISTURIZER EVERY DAY ALONG WITH A MOISTURIZING SOAP  PLEASE USE  AVEENO MOISTURIZING   BODY SOAP OR DOVE SOAP IN THE SHOWER   HAVE [name] MOISTURIZE  YOUR BACK AFTER SHOWER   INCREASE YOUR WATER INTAKE TO 48 TO 60 OUNCES DAILY   MAKE SURE SHE IS USING A DETERGENT WITH NO FRAGRANCE  (HYPOALLERGENIC)   USE CLARITIN  (LORATADINE )  EVERY 6 HOURS IF NEEDED FOR ITCHING . INSTEAD OF HYDROXYZINE   ONE MORE WEEK OF PREDNISONE    HAVE [name] CHECK YOUR BLOOD PRESSURE ONCE YOU START THE NEW BOTTLE OF AMLODIPINE  5 MG TO MAKE SURE IT IS NOT TOO STRONG   I HAVE MAE A REFERRAL TO DR [facility] FOR YOUR MEMORY ISSUES.  HIS OFFICE AT [facility] WILL TRY TO REACH YOU BY PHONE 2 OR 3 TIMES . IF YOU ARE NOT CONTACTED IN 2 WEEKS,  LET ME KNOW

## 2024-03-29 NOTE — Telephone Encounter (Signed)
 Noted

## 2024-03-29 NOTE — Assessment & Plan Note (Signed)
 HE HAS HAD  a noticeable decline in short term memory over the past year.  History of CVA in 2022.  Referring to neurology

## 2024-03-29 NOTE — Assessment & Plan Note (Signed)
 Continue antihistamine , increase moisturization of skin. Repeat prednisone  taper x 1 week.  Sees Dahser in one month

## 2024-03-29 NOTE — Progress Notes (Signed)
 "  Subjective:  Patient ID: Bobby Murillo, male    DOB: 27-Apr-1940  Age: 84 y.o. [name]: [id number]  CC: The primary encounter diagnosis was Memory deficit after cerebral infarction. Diagnoses of Allergic contact dermatitis, unspecified trigger, Stage 3a chronic kidney disease (HCC), and Chronic pruritic rash in adult were also pertinent to this visit. Follow up  HPI Bobby Murillo presents for  Chief Complaint  Patient presents with   Rash   Follow up on rash.  [name] was seen  2 weeks ago  and reported a new onset rash involving  shoulders and back  which had been present  for several weeks .  Has borrowed linda's hydroxyzine , which help relieved the itching temporariy,  he had reported no recent travel or overnight stays  in a hotel. His exam was significant for a papular red rash on arms, back and thighs .  Thinks his wife has changed detergents to ALL..  has been using the same soap for years , Dove when he showers . SABRA  Does not use moisturizer   Prednisone   taper was prescribed.  And I Recommend detergent change along with   daily use of Aveeno colloidal oatmeal or Cerave, and hydroxyzine every 8 hours as needed .   At 2 week follow up today he notes  TRANSIENT IMPROVEMENT  during prednisone  use but recurrence when taper ended.  He has not been  MOISTURIZING, is  NO LONGER TAKING HYDROXYZINE, and not sure if [employer].  2) insomnia : he is now tAKING 1/2 ALPRAZOLAM  AND 1 RELAXIUM   3) MEMORY LOSS :he has noticed a progressive change in his  short term memory since his stroke  several years ago  he is not followed by a neurologist.    Outpatient Medications Prior to Visit  Medication Sig Dispense Refill   acetaminophen  (TYLENOL ) 325 MG tablet Take 650 mg by mouth every 6 (six) hours as needed for moderate pain.     ALPRAZolam  (XANAX ) 0.5 MG tablet TAKE 1 TABLET BY MOUTH AT BEDTIME AS NEEDED FOR SLEEP 30 tablet 5   aspirin  EC 81 MG tablet Take 81 mg by mouth daily.  Swallow whole.     clopidogrel  (PLAVIX ) 75 MG tablet Take 1 tablet by mouth once daily 90 tablet 3   Evolocumab  (REPATHA  SURECLICK) 140 MG/ML SOAJ Inject 140 mg into the skin every 14 (fourteen) days. 2 mL 11   ezetimibe  (ZETIA ) 10 MG tablet Take 1 tablet (10 mg total) by mouth daily. 90 tablet 3   losartan  (COZAAR ) 100 MG tablet Take 1 tablet (100 mg total) by mouth daily. 90 tablet 3   polycarbophil (FIBERCON) 625 MG tablet Take 625 mg by mouth daily.     amLODipine  (NORVASC ) 5 MG tablet Take 1 tablet (5 mg total) by mouth daily. 90 tablet 1   predniSONE  (DELTASONE ) 10 MG tablet 6 tablets on Day 1 , then reduce by 1 tablet daily until gone (Patient not taking: Reported on 03/29/2024) 21 tablet 0   No facility-administered medications prior to visit.    Review of Systems;  Patient denies headache, fevers, malaise, unintentional weight loss, skin rash, eye pain, sinus congestion and sinus pain, sore throat, dysphagia,  hemoptysis , cough, dyspnea, wheezing, chest pain, palpitations, orthopnea, edema, abdominal pain, nausea, melena, diarrhea, constipation, flank pain, dysuria, hematuria, urinary  Frequency, nocturia, numbness, tingling, seizures,  Focal weakness, Loss of consciousness,  Tremor, insomnia, depression, anxiety, and suicidal ideation.  Objective:  BP 124/70 (BP Location: Left Arm, Patient Position: Sitting)   Pulse 60   Temp (!) 97.5 F (36.4 C) (Oral)   Ht 6' (1.829 m)   Wt 202 lb 12.8 oz (92 kg)   SpO2 95%   BMI 27.50 kg/m   BP Readings from Last 3 Encounters:  03/29/24 124/70  02/25/24 132/70  02/18/24 104/60    Wt Readings from Last 3 Encounters:  03/29/24 202 lb 12.8 oz (92 kg)  02/25/24 205 lb 6.4 oz (93.2 kg)  02/18/24 210 lb 3.2 oz (95.3 kg)    Physical Exam Vitals reviewed.  Constitutional:      General: He is not in acute distress.    Appearance: Normal appearance. He is normal weight. He is not ill-appearing, toxic-appearing or diaphoretic.   HENT:     Head: Normocephalic.  Eyes:     General: No scleral icterus.       Right eye: No discharge.        Left eye: No discharge.     Conjunctiva/sclera: Conjunctivae normal.  Cardiovascular:     Rate and Rhythm: Normal rate and regular rhythm.     Heart sounds: Normal heart sounds.  Pulmonary:     Effort: Pulmonary effort is normal. No respiratory distress.     Breath sounds: Normal breath sounds.  Musculoskeletal:        General: Normal range of motion.     Cervical back: Normal range of motion.  Skin:    General: Skin is warm and dry.  Neurological:     General: No focal deficit present.     Mental Status: He is alert and oriented to person, place, and time. Mental status is at baseline.  Psychiatric:        Mood and Affect: Mood normal.        Behavior: Behavior normal.        Thought Content: Thought content normal.        Judgment: Judgment normal.     Lab Results  Component Value Date   HGBA1C 6.2 01/07/2024   HGBA1C 5.9 12/25/2022   HGBA1C 6.2 05/27/2021    Lab Results  Component Value Date   CREATININE 1.21 01/07/2024   CREATININE 1.26 06/25/2023   CREATININE 1.19 12/25/2022    Lab Results  Component Value Date   WBC 5.7 01/07/2024   HGB 16.7 01/07/2024   HCT 50.2 01/07/2024   PLT 239.0 01/07/2024   GLUCOSE 92 01/07/2024   CHOL 124 01/07/2024   TRIG 152.0 (H) 01/07/2024   HDL 56.30 01/07/2024   LDLDIRECT 48.0 01/07/2024   LDLCALC 37 01/07/2024   ALT 30 01/07/2024   AST 24 01/07/2024   NA 141 01/07/2024   K 4.7 01/07/2024   CL 108 01/07/2024   CREATININE 1.21 01/07/2024   BUN 21 01/07/2024   CO2 23 01/07/2024   TSH 2.50 01/07/2024   PSA 0.00 (L) 05/27/2021   INR 1.0 02/22/2021   HGBA1C 6.2 01/07/2024   MICROALBUR <0.7 06/25/2023    US  Abdomen Limited RUQ (LIVER/GB) Result Date: 07/09/2023 CLINICAL DATA:  Persistent elevation of bilirubin EXAM: ULTRASOUND ABDOMEN LIMITED RIGHT UPPER QUADRANT COMPARISON:  CT abdomen and pelvis December 16, 2021 FINDINGS: Gallbladder: No gallstones or wall thickening visualized. No sonographic Murphy sign noted by sonographer. Common bile duct: Diameter: 4.1 mm Liver: No focal lesion. Increased echotexture. Portal vein is patent on color Doppler imaging with normal direction of blood flow towards the liver. Other: None. IMPRESSION: 1.  No acute abnormality identified. 2. Increased echotexture of the liver. This is a nonspecific finding but can be seen in fatty infiltration of liver. Electronically Signed   By: Craig Farr M.D.   On: 07/09/2023 15:08    Assessment & Plan:  .Memory deficit after cerebral infarction Assessment & Plan: HE HAS HAD  a noticeable decline in short term memory over the past year.  History of CVA in 2022.  Referring to neurology  Orders: -     Ambulatory referral to Neurology  Allergic contact dermatitis, unspecified trigger Assessment & Plan: Continue antihistamine , increase moisturization of skin. Repeat prednisone  taper x 1 week.  Sees Dahser in one month   Stage 3a chronic kidney disease (HCC) Assessment & Plan: No prior nephrology evaluation.  GFR is stable at 57 m/min.  He is avoiding NSAIDs.   Lab Results  Component Value Date   CREATININE 1.21 01/07/2024   Lab Results  Component Value Date   NA 141 01/07/2024   K 4.7 01/07/2024   CL 108 01/07/2024   CO2 23 01/07/2024      Chronic pruritic rash in adult Assessment & Plan: The rash is improving but his skin remains xerotic and covered in SK's as well as resolving papules.  Will repeat prednisone  taper,  change hydoxyzine to claritin   q 6 hrs prn itching and follow up with dermatology    Other orders -     amLODIPine  Besylate; Take 1 tablet (5 mg total) by mouth daily.  Dispense: 90 tablet; Refill: 1 -     predniSONE ; 6 tablets on Day 1 , then reduce by 1 tablet daily until gone  Dispense: 21 tablet; Refill: 0 -     Loratadine ; Take 1 tablet (10 mg total) by mouth every 6 (six) hours as needed  for itching.  Dispense: 120 tablet; Refill: 2     I spent 34 mipnutes on the day of this face to face encounter reviewing patient's  most recent visit with cardiology,  rior relevant surgical and non surgical procedures, recent  labs and imaging studies, counseling on hypertension and skin care  management,  reviewing the assessment and plan with patient, and post visit ordering and reviewing of  diagnostics and therapeutics with patient  .   Follow-up: No follow-ups on file.   Verneita LITTIE Kettering, MD "

## 2024-03-30 DIAGNOSIS — L2989 Other pruritus: Secondary | ICD-10-CM | POA: Insufficient documentation

## 2024-03-30 NOTE — Assessment & Plan Note (Signed)
 No prior nephrology evaluation.  GFR is stable at 57 m/min.  He is avoiding NSAIDs.   Lab Results  Component Value Date   CREATININE 1.21 01/07/2024   Lab Results  Component Value Date   NA 141 01/07/2024   K 4.7 01/07/2024   CL 108 01/07/2024   CO2 23 01/07/2024

## 2024-03-30 NOTE — Assessment & Plan Note (Signed)
 The rash is improving but his skin remains xerotic and covered in SK's as well as resolving papules.  Will repeat prednisone  taper,  change hydoxyzine to claritin   q 6 hrs prn itching and follow up with dermatology

## 2024-04-28 ENCOUNTER — Ambulatory Visit: Payer: Medicare Other

## 2024-05-15 ENCOUNTER — Encounter (INDEPENDENT_AMBULATORY_CARE_PROVIDER_SITE_OTHER)

## 2024-05-15 ENCOUNTER — Ambulatory Visit (INDEPENDENT_AMBULATORY_CARE_PROVIDER_SITE_OTHER): Admitting: Vascular Surgery

## 2025-01-08 ENCOUNTER — Encounter: Admitting: Internal Medicine
